# Patient Record
Sex: Female | Born: 1960 | Race: Black or African American | Hispanic: No | Marital: Married | Smoking: Never smoker
Health system: Southern US, Community
[De-identification: ages and names within clinical notes are randomized; demographics above are authoritative.]

## PROBLEM LIST (undated history)

## (undated) DIAGNOSIS — G629 Polyneuropathy, unspecified: Secondary | ICD-10-CM

## (undated) DIAGNOSIS — E119 Type 2 diabetes mellitus without complications: Secondary | ICD-10-CM

## (undated) DIAGNOSIS — E785 Hyperlipidemia, unspecified: Secondary | ICD-10-CM

## (undated) DIAGNOSIS — I10 Essential (primary) hypertension: Secondary | ICD-10-CM

## (undated) DIAGNOSIS — D649 Anemia, unspecified: Secondary | ICD-10-CM

## (undated) DIAGNOSIS — Z8042 Family history of malignant neoplasm of prostate: Secondary | ICD-10-CM

## (undated) DIAGNOSIS — R7303 Prediabetes: Secondary | ICD-10-CM

## (undated) DIAGNOSIS — C801 Malignant (primary) neoplasm, unspecified: Secondary | ICD-10-CM

## (undated) DIAGNOSIS — J189 Pneumonia, unspecified organism: Secondary | ICD-10-CM

## (undated) DIAGNOSIS — F419 Anxiety disorder, unspecified: Secondary | ICD-10-CM

## (undated) DIAGNOSIS — Z803 Family history of malignant neoplasm of breast: Secondary | ICD-10-CM

## (undated) DIAGNOSIS — Z800 Family history of malignant neoplasm of digestive organs: Secondary | ICD-10-CM

## (undated) HISTORY — DX: Hyperlipidemia, unspecified: E78.5

## (undated) HISTORY — DX: Family history of malignant neoplasm of prostate: Z80.42

## (undated) HISTORY — DX: Essential (primary) hypertension: I10

## (undated) HISTORY — DX: Family history of malignant neoplasm of digestive organs: Z80.0

## (undated) HISTORY — PX: OTHER SURGICAL HISTORY: SHX169

## (undated) HISTORY — PX: ABDOMINAL HYSTERECTOMY: SUR658

## (undated) HISTORY — DX: Family history of malignant neoplasm of breast: Z80.3

## (undated) HISTORY — DX: Anxiety disorder, unspecified: F41.9

## (undated) HISTORY — PX: DILATION AND CURETTAGE OF UTERUS: SHX78

## (undated) HISTORY — PX: BREAST CYST EXCISION: SHX579

---

## 2003-07-04 ENCOUNTER — Other Ambulatory Visit: Admission: RE | Admit: 2003-07-04 | Discharge: 2003-07-04 | Payer: Self-pay | Admitting: Family Medicine

## 2004-11-05 ENCOUNTER — Ambulatory Visit: Payer: Self-pay | Admitting: Family Medicine

## 2004-11-05 ENCOUNTER — Other Ambulatory Visit: Admission: RE | Admit: 2004-11-05 | Discharge: 2004-11-05 | Payer: Self-pay | Admitting: Family Medicine

## 2005-01-11 LAB — CONVERTED CEMR LAB

## 2007-10-25 ENCOUNTER — Encounter: Admission: RE | Admit: 2007-10-25 | Discharge: 2007-10-25 | Payer: Self-pay | Admitting: Internal Medicine

## 2007-10-25 ENCOUNTER — Encounter: Payer: Self-pay | Admitting: Internal Medicine

## 2007-11-20 ENCOUNTER — Ambulatory Visit: Payer: Self-pay | Admitting: Internal Medicine

## 2007-11-20 DIAGNOSIS — I10 Essential (primary) hypertension: Secondary | ICD-10-CM | POA: Insufficient documentation

## 2007-11-20 DIAGNOSIS — D649 Anemia, unspecified: Secondary | ICD-10-CM | POA: Insufficient documentation

## 2007-11-22 LAB — CONVERTED CEMR LAB
Albumin: 3.7 g/dL (ref 3.5–5.2)
Basophils Absolute: 0 10*3/uL (ref 0.0–0.1)
Bilirubin Urine: NEGATIVE
CO2: 25 meq/L (ref 19–32)
Chloride: 104 meq/L (ref 96–112)
Direct LDL: 114.3 mg/dL
Eosinophils Relative: 1.7 % (ref 0.0–5.0)
GFR calc Af Amer: 99 mL/min
HCT: 33.8 % — ABNORMAL LOW (ref 36.0–46.0)
Hemoglobin, Urine: NEGATIVE
Ketones, ur: NEGATIVE mg/dL
Lymphocytes Relative: 23.8 % (ref 12.0–46.0)
Monocytes Absolute: 0.4 10*3/uL (ref 0.2–0.7)
Nitrite: NEGATIVE
Potassium: 3.6 meq/L (ref 3.5–5.1)
RDW: 13.5 % (ref 11.5–14.6)
TSH: 1.12 microintl units/mL (ref 0.35–5.50)
Total CHOL/HDL Ratio: 2.6
Total Protein, Urine: NEGATIVE mg/dL
Triglycerides: 44 mg/dL (ref 0–149)
Urine Glucose: NEGATIVE mg/dL
Urobilinogen, UA: 0.2 (ref 0.0–1.0)
VLDL: 9 mg/dL (ref 0–40)
WBC: 7.5 10*3/uL (ref 4.5–10.5)
pH: 5.5 (ref 5.0–8.0)

## 2007-12-05 ENCOUNTER — Ambulatory Visit: Payer: Self-pay | Admitting: Internal Medicine

## 2007-12-05 LAB — CONVERTED CEMR LAB
OCCULT 1: NEGATIVE
OCCULT 3: NEGATIVE

## 2007-12-07 LAB — CONVERTED CEMR LAB
OCCULT 1: NEGATIVE
OCCULT 2: NEGATIVE
OCCULT 3: NEGATIVE
OCCULT 5: NEGATIVE

## 2008-01-02 ENCOUNTER — Ambulatory Visit: Payer: Self-pay | Admitting: Internal Medicine

## 2008-01-15 ENCOUNTER — Ambulatory Visit: Payer: Self-pay | Admitting: Internal Medicine

## 2008-01-15 ENCOUNTER — Encounter: Payer: Self-pay | Admitting: Internal Medicine

## 2008-01-16 LAB — CONVERTED CEMR LAB
BUN: 10 mg/dL (ref 6–23)
CO2: 29 meq/L (ref 19–32)
Chloride: 107 meq/L (ref 96–112)
GFR calc Af Amer: 99 mL/min
GFR calc non Af Amer: 82 mL/min
Glucose, Bld: 96 mg/dL (ref 70–99)
Sodium: 139 meq/L (ref 135–145)

## 2008-03-01 ENCOUNTER — Ambulatory Visit: Payer: Self-pay | Admitting: Internal Medicine

## 2008-04-18 ENCOUNTER — Ambulatory Visit: Payer: Self-pay | Admitting: Internal Medicine

## 2008-04-18 LAB — CONVERTED CEMR LAB
CO2: 30 meq/L (ref 19–32)
Calcium: 9 mg/dL (ref 8.4–10.5)
Potassium: 3.5 meq/L (ref 3.5–5.1)

## 2008-04-21 ENCOUNTER — Telehealth: Payer: Self-pay | Admitting: Internal Medicine

## 2008-10-15 ENCOUNTER — Ambulatory Visit: Payer: Self-pay | Admitting: Internal Medicine

## 2009-01-24 ENCOUNTER — Ambulatory Visit: Payer: Self-pay | Admitting: Internal Medicine

## 2009-01-24 DIAGNOSIS — E785 Hyperlipidemia, unspecified: Secondary | ICD-10-CM | POA: Insufficient documentation

## 2009-01-24 LAB — CONVERTED CEMR LAB
BUN: 18 mg/dL (ref 6–23)
Chloride: 104 meq/L (ref 96–112)
GFR calc non Af Amer: 85.9 mL/min (ref >60)
Potassium: 3.9 meq/L (ref 3.5–5.1)

## 2009-01-26 ENCOUNTER — Telehealth: Payer: Self-pay | Admitting: Internal Medicine

## 2009-02-28 ENCOUNTER — Telehealth: Payer: Self-pay | Admitting: Internal Medicine

## 2009-02-28 ENCOUNTER — Ambulatory Visit: Payer: Self-pay | Admitting: Internal Medicine

## 2009-02-28 LAB — CONVERTED CEMR LAB
ALT: 24 units/L (ref 0–35)
CRP, High Sensitivity: <1 (ref 0.00–5.00)
Cholesterol: 211 mg/dL — ABNORMAL HIGH (ref 0–200)
Direct LDL: 111.8 mg/dL
HDL: 83 mg/dL (ref >39.00)
Triglycerides: 34 mg/dL (ref 0.0–149.0)
VLDL: 6.8 mg/dL (ref 0.0–40.0)

## 2009-03-27 ENCOUNTER — Ambulatory Visit: Payer: Self-pay | Admitting: Internal Medicine

## 2009-03-27 DIAGNOSIS — E663 Overweight: Secondary | ICD-10-CM | POA: Insufficient documentation

## 2009-07-01 ENCOUNTER — Telehealth: Payer: Self-pay | Admitting: Internal Medicine

## 2009-07-02 ENCOUNTER — Telehealth: Payer: Self-pay | Admitting: Internal Medicine

## 2012-05-01 ENCOUNTER — Other Ambulatory Visit: Payer: Self-pay | Admitting: Family Medicine

## 2012-05-01 DIAGNOSIS — Z1231 Encounter for screening mammogram for malignant neoplasm of breast: Secondary | ICD-10-CM

## 2012-05-03 ENCOUNTER — Ambulatory Visit
Admission: RE | Admit: 2012-05-03 | Discharge: 2012-05-03 | Disposition: A | Discharge status: Home / Self Care | Payer: BC Managed Care – PPO | Source: Ambulatory Visit | Attending: Family Medicine | Admitting: Family Medicine

## 2012-05-03 DIAGNOSIS — Z1231 Encounter for screening mammogram for malignant neoplasm of breast: Secondary | ICD-10-CM

## 2012-05-09 ENCOUNTER — Other Ambulatory Visit: Payer: Self-pay | Admitting: Family Medicine

## 2012-05-09 DIAGNOSIS — R928 Other abnormal and inconclusive findings on diagnostic imaging of breast: Secondary | ICD-10-CM

## 2012-05-15 ENCOUNTER — Other Ambulatory Visit: Payer: Self-pay | Admitting: Family Medicine

## 2012-05-15 ENCOUNTER — Ambulatory Visit
Admission: RE | Admit: 2012-05-15 | Discharge: 2012-05-15 | Disposition: A | Discharge status: Home / Self Care | Payer: BC Managed Care – PPO | Source: Ambulatory Visit | Attending: Family Medicine | Admitting: Family Medicine

## 2012-05-15 DIAGNOSIS — R928 Other abnormal and inconclusive findings on diagnostic imaging of breast: Secondary | ICD-10-CM

## 2012-05-15 IMAGING — US US BREAST*R*
1 series · 4 of 4 positions shown · non-contrast
Comparison: Multiple priors

CLINICAL DATA: Abnormal screening, right breast

DIGITAL DIAGNOSTIC RIGHT MAMMOGRAM WITHOUT CAD AND RIGHT BREAST
ULTRASOUND:

[Series 1: us breast*right* · 4 of 4 slices shown]
[im 1/4]
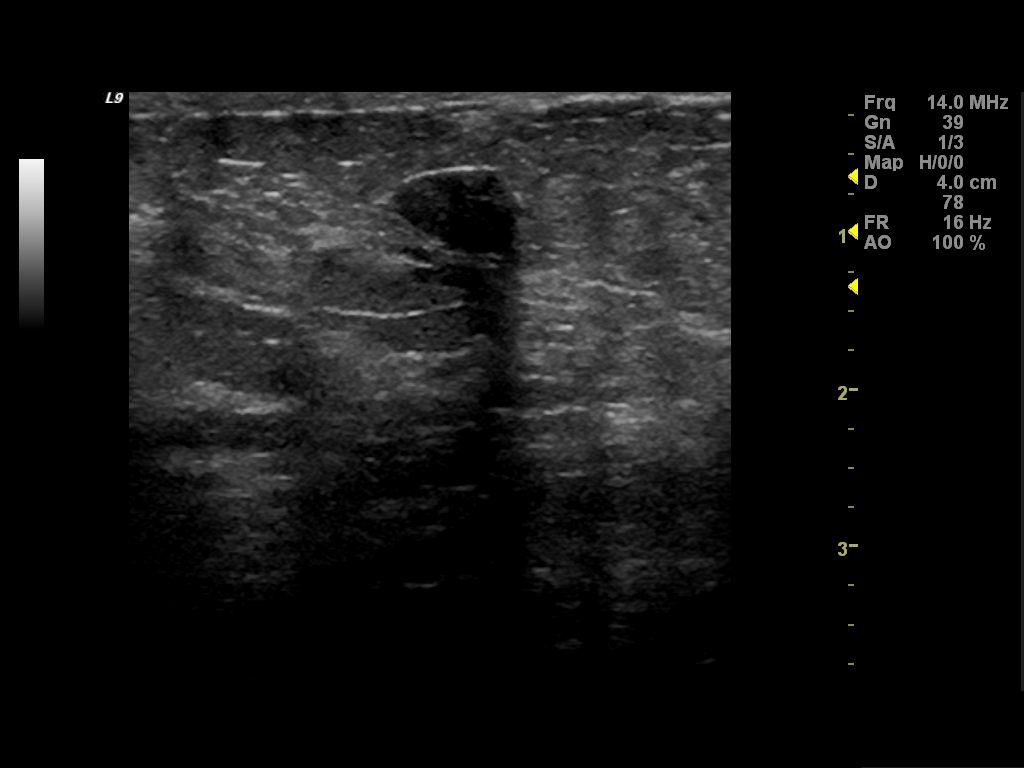
[im 2/4]
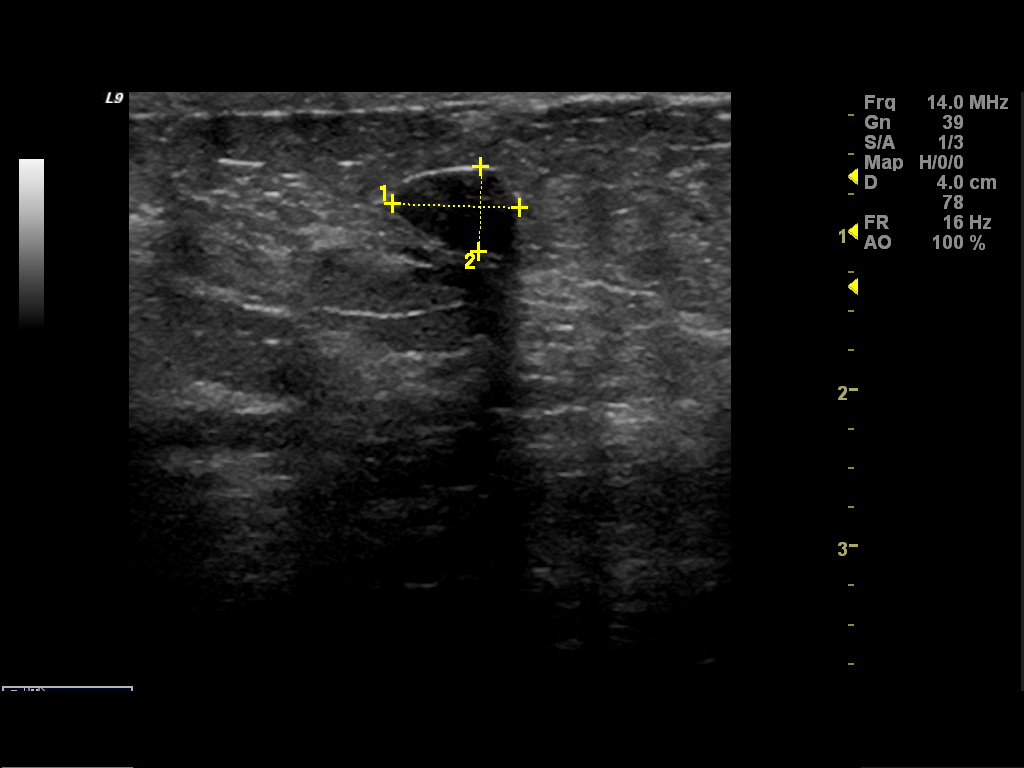
[im 3/4]
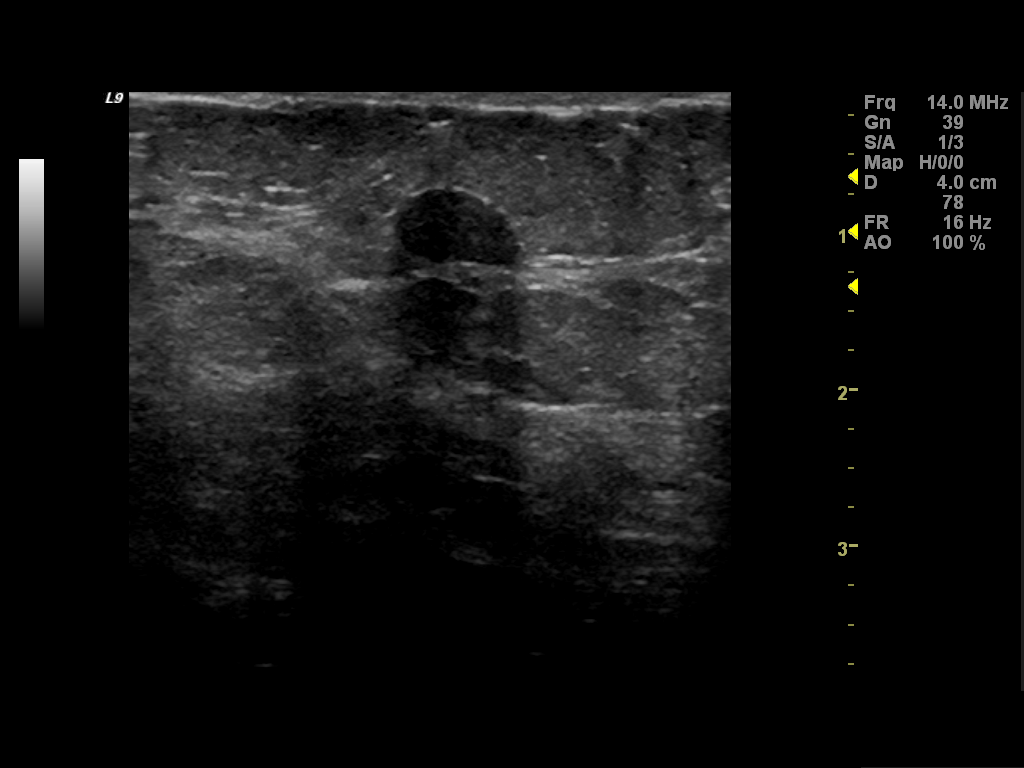
[im 4/4]
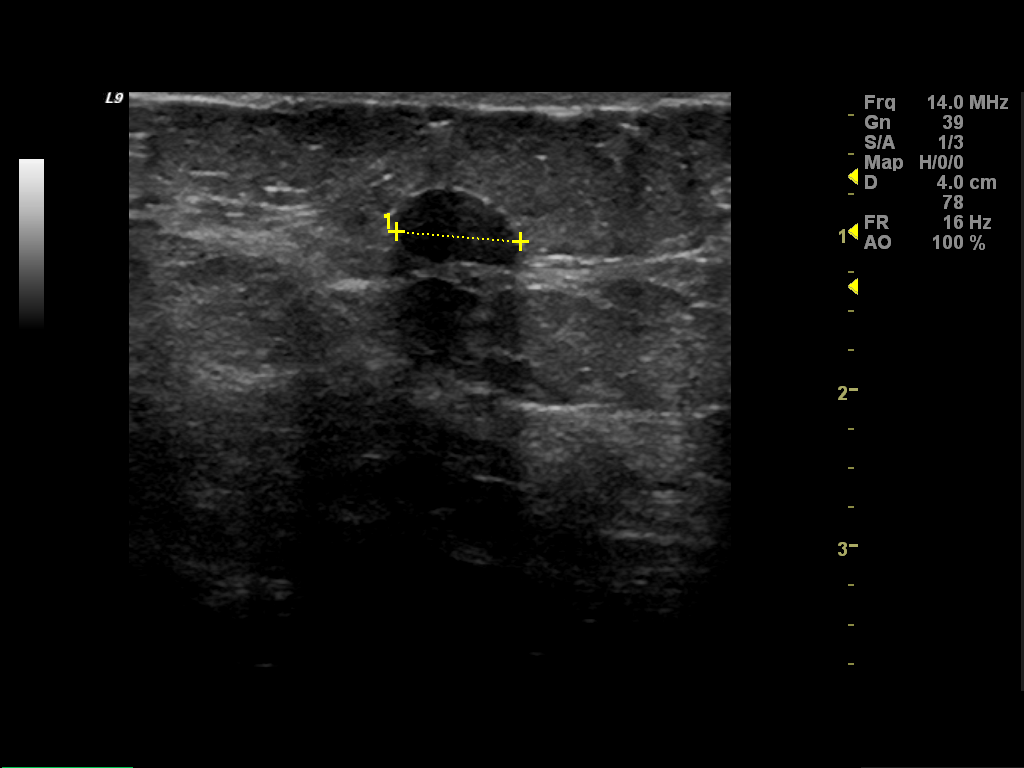

[4 of 4 positions shown; findings below may reference images not displayed]

FINDINGS: Spot compression views in the lower inner quadrant of
the right breast, anteriorly confirm the presence of a round
isodense mass with partially obscured and partially circumscribed
margins.

On physical exam, I palpate normal tissue in the lower outer
quadrant of the right breast.

Ultrasound is performed, showing a round mixed hypo and anechoic
mass with circumscribed margins and posterior acoustic shadowing in
the [DATE] position, 2 cm the nipple measuring 0.8 x 0.5 x 0.8 cm.
This corresponds to the mass seen mammographically.
IMPRESSION: Mass, right breast which biopsy is recommended.

RECOMMENDATION:
Ultrasound guided right breast biopsy.

BI-RADS CATEGORY 4:  Suspicious abnormality - biopsy should be
considered.

## 2012-05-23 ENCOUNTER — Ambulatory Visit
Admission: RE | Admit: 2012-05-23 | Discharge: 2012-05-23 | Disposition: A | Discharge status: Home / Self Care | Payer: BC Managed Care – PPO | Source: Ambulatory Visit | Attending: Family Medicine | Admitting: Family Medicine

## 2012-05-23 ENCOUNTER — Other Ambulatory Visit: Payer: Self-pay | Admitting: Family Medicine

## 2012-05-23 DIAGNOSIS — R928 Other abnormal and inconclusive findings on diagnostic imaging of breast: Secondary | ICD-10-CM

## 2012-05-23 HISTORY — PX: BREAST BIOPSY: SHX20

## 2012-12-27 ENCOUNTER — Encounter: Payer: Self-pay | Admitting: Gastroenterology

## 2013-01-16 ENCOUNTER — Ambulatory Visit (AMBULATORY_SURGERY_CENTER): Payer: BC Managed Care – PPO

## 2013-01-16 VITALS — Ht 68.0 in | Wt 184.6 lb

## 2013-01-16 DIAGNOSIS — Z1211 Encounter for screening for malignant neoplasm of colon: Secondary | ICD-10-CM

## 2013-01-16 MED ORDER — MOVIPREP 100 G PO SOLR: ORAL | Status: DC

## 2013-01-17 ENCOUNTER — Encounter: Payer: Self-pay | Admitting: Gastroenterology

## 2013-01-31 ENCOUNTER — Encounter: Payer: Self-pay | Admitting: Gastroenterology

## 2013-01-31 ENCOUNTER — Ambulatory Visit (AMBULATORY_SURGERY_CENTER): Payer: BC Managed Care – PPO | Admitting: Gastroenterology

## 2013-01-31 VITALS — BP 126/44 | HR 58 | Temp 97.2°F | Resp 17 | Ht 68.0 in | Wt 184.0 lb

## 2013-01-31 DIAGNOSIS — Z1211 Encounter for screening for malignant neoplasm of colon: Secondary | ICD-10-CM

## 2013-01-31 DIAGNOSIS — Z538 Procedure and treatment not carried out for other reasons: Secondary | ICD-10-CM

## 2013-01-31 MED ORDER — SODIUM CHLORIDE 0.9 % IV SOLN: 500.0000 mL | INTRAVENOUS | Status: DC

## 2013-01-31 NOTE — Progress Notes (Signed)
Patient did not experience any of the following events: a burn prior to discharge; a fall within the facility; wrong site/side/patient/procedure/implant event; or a hospital transfer or hospital admission upon discharge from the facility. (G8907) Patient did not have preoperative order for IV antibiotic SSI prophylaxis. (G8918)  

## 2013-01-31 NOTE — Patient Instructions (Addendum)
Discharge instructions given with verbal understanding. Normal exam. Resume previous medications. YOU HAD AN ENDOSCOPIC PROCEDURE TODAY AT THE [facility]: Refer to the procedure report that was given to you for any specific questions about what was found during the examination.  If the procedure report does not answer your questions, please call your gastroenterologist to clarify.  If you requested that your care partner not be given the details of your procedure findings, then the procedure report has been included in a sealed envelope for you to review at your convenience later.  YOU SHOULD EXPECT: Some feelings of bloating in the abdomen. Passage of more gas than usual.  Walking can help get rid of the air that was put into your GI tract during the procedure and reduce the bloating. If you had a lower endoscopy (such as a colonoscopy or flexible sigmoidoscopy) you may notice spotting of blood in your stool or on the toilet paper. If you underwent a bowel prep for your procedure, then you may not have a normal bowel movement for a few days.  DIET: Your first meal following the procedure should be a light meal and then it is ok to progress to your normal diet.  A half-sandwich or bowl of soup is an example of a good first meal.  Heavy or fried foods are harder to digest and may make you feel nauseous or bloated.  Likewise meals heavy in dairy and vegetables can cause extra gas to form and this can also increase the bloating.  Drink plenty of fluids but you should avoid alcoholic beverages for 24 hours.  ACTIVITY: Your care partner should take you home directly after the procedure.  You should plan to take it easy, moving slowly for the rest of the day.  You can resume normal activity the day after the procedure however you should NOT DRIVE or use heavy machinery for 24 hours (because of the sedation medicines used during the test).    SYMPTOMS TO REPORT IMMEDIATELY: A gastroenterologist  can be reached at any hour.  During normal business hours, 8:30 AM to 5:00 PM Monday through Friday, call [phone].  After hours and on weekends, please call the GI answering service at [phone] who will take a message and have the physician on call contact you.   Following lower endoscopy (colonoscopy or flexible sigmoidoscopy):  Excessive amounts of blood in the stool  Significant tenderness or worsening of abdominal pains  Swelling of the abdomen that is new, acute  Fever of 100F or higher  FOLLOW UP: If any biopsies were taken you will be contacted by phone or by letter within the next 1-3 weeks.  Call your gastroenterologist if you have not heard about the biopsies in 3 weeks.  Our staff will call the home number listed on your records the next business day following your procedure to check on you and address any questions or concerns that you may have at that time regarding the information given to you following your procedure. This is a courtesy call and so if there is no answer at the home number and we have not heard from you through the emergency physician on call, we will assume that you have returned to your regular daily activities without incident.  SIGNATURES/CONFIDENTIALITY: You and/or your care partner have signed paperwork which will be entered into your electronic medical record.  These signatures attest to the fact that that the information above on your After Visit Summary has been reviewed   and is understood.  Full responsibility of the confidentiality of this discharge information lies with you and/or your care-partner. 

## 2013-01-31 NOTE — Op Note (Signed)
[facility] [address] [place], [address]   COLONOSCOPY PROCEDURE REPORT  PATIENT: Rita, Davis  MR#: [id number] BIRTHDATE: 1960-11-19 , 52  yrs. old GENDER: Female ENDOSCOPIST: Mardella Layman, MD, Clementeen Graham REFERRED BY:  Nolon Nations, M.D. PROCEDURE DATE:  01/31/2013 PROCEDURE:   Colonoscopy, surveillance ASA CLASS:   Class II INDICATIONS:Average risk patient for colon cancer. MEDICATIONS: propofol (Diprivan) 300mg  IV  DESCRIPTION OF PROCEDURE:   After the risks and benefits and of the procedure were explained, informed consent was obtained.  A digital rectal exam revealed no abnormalities of the rectum.    The LB [id number]  endoscope was introduced through the anus and advanced to the cecum, which was identified by both the appendix and ileocecal valve .  The quality of the prep was poor, using MoviPrep .  The instrument was then slowly withdrawn as the colon was fully examined.     COLON FINDINGS: A normal appearing cecum, ileocecal valve, and appendiceal orifice were identified.  The ascending, hepatic flexure, transverse, splenic flexure, descending, sigmoid colon and rectum appeared unremarkable.  No polyps or cancers were seen. The scope was then withdrawn from the patient and the procedure completed.Changes of mucosal prolapse noted in rectum. C COMPLICATIONS: There were no complications. ENDOSCOPIC IMPRESSION: Normal colon  RECOMMENDATIONS: 1.  Repeat Colonoscopy in 5 years per poor prep ...will need "double prep ".... 2.  Metamucil or benefiber   REPEAT EXAM:  cc:  _______________________________ eSignedMardella Layman, MD, [facility] 01/31/2013 8:50 AM

## 2013-02-01 ENCOUNTER — Telehealth: Payer: Self-pay | Admitting: *Deleted

## 2013-02-01 NOTE — Telephone Encounter (Signed)
  Follow up Call-  Call back number 01/31/2013  Post procedure Call Back phone  # [phone] or cell [phone]  Permission to leave phone message Yes     Patient questions:  Do you have a fever, pain , or abdominal swelling? no Pain Score  0 *  Have you tolerated food without any problems? yes  Have you been able to return to your normal activities? yes  Do you have any questions about your discharge instructions: Diet   no Medications  no Follow up visit  no  Do you have questions or concerns about your Care? no  Actions: * If pain score is 4 or above: No action needed, pain <4.

## 2013-05-03 ENCOUNTER — Other Ambulatory Visit: Payer: Self-pay

## 2013-05-03 DIAGNOSIS — Z1231 Encounter for screening mammogram for malignant neoplasm of breast: Secondary | ICD-10-CM

## 2013-05-21 ENCOUNTER — Ambulatory Visit
Admission: RE | Admit: 2013-05-21 | Discharge: 2013-05-21 | Disposition: A | Discharge status: Home / Self Care | Payer: BC Managed Care – PPO | Source: Ambulatory Visit

## 2013-05-21 DIAGNOSIS — Z1231 Encounter for screening mammogram for malignant neoplasm of breast: Secondary | ICD-10-CM

## 2014-02-28 DIAGNOSIS — F411 Generalized anxiety disorder: Secondary | ICD-10-CM | POA: Insufficient documentation

## 2014-04-10 DIAGNOSIS — Z0000 Encounter for general adult medical examination without abnormal findings: Secondary | ICD-10-CM | POA: Insufficient documentation

## 2014-04-22 ENCOUNTER — Other Ambulatory Visit: Payer: Self-pay

## 2014-04-22 DIAGNOSIS — Z1231 Encounter for screening mammogram for malignant neoplasm of breast: Secondary | ICD-10-CM

## 2014-05-22 ENCOUNTER — Encounter (INDEPENDENT_AMBULATORY_CARE_PROVIDER_SITE_OTHER): Payer: Self-pay

## 2014-05-22 ENCOUNTER — Ambulatory Visit
Admission: RE | Admit: 2014-05-22 | Discharge: 2014-05-22 | Disposition: A | Discharge status: Home / Self Care | Payer: BC Managed Care – PPO | Source: Ambulatory Visit

## 2014-05-22 DIAGNOSIS — Z1231 Encounter for screening mammogram for malignant neoplasm of breast: Secondary | ICD-10-CM

## 2015-05-21 ENCOUNTER — Other Ambulatory Visit: Payer: Self-pay

## 2015-05-21 ENCOUNTER — Other Ambulatory Visit: Payer: Self-pay | Admitting: Family Medicine

## 2015-05-21 DIAGNOSIS — Z1231 Encounter for screening mammogram for malignant neoplasm of breast: Secondary | ICD-10-CM

## 2015-05-26 ENCOUNTER — Ambulatory Visit: Payer: BC Managed Care – PPO

## 2015-05-27 ENCOUNTER — Ambulatory Visit
Admission: RE | Admit: 2015-05-27 | Discharge: 2015-05-27 | Disposition: A | Discharge status: Home / Self Care | Payer: BC Managed Care – PPO | Source: Ambulatory Visit

## 2015-05-27 DIAGNOSIS — Z1231 Encounter for screening mammogram for malignant neoplasm of breast: Secondary | ICD-10-CM

## 2015-11-26 DIAGNOSIS — L309 Dermatitis, unspecified: Secondary | ICD-10-CM | POA: Insufficient documentation

## 2015-11-26 DIAGNOSIS — F5102 Adjustment insomnia: Secondary | ICD-10-CM | POA: Insufficient documentation

## 2015-11-26 DIAGNOSIS — R7989 Other specified abnormal findings of blood chemistry: Secondary | ICD-10-CM | POA: Insufficient documentation

## 2016-04-21 ENCOUNTER — Other Ambulatory Visit: Payer: Self-pay | Admitting: Family Medicine

## 2016-04-21 DIAGNOSIS — Z1231 Encounter for screening mammogram for malignant neoplasm of breast: Secondary | ICD-10-CM

## 2016-05-28 ENCOUNTER — Ambulatory Visit
Admission: RE | Admit: 2016-05-28 | Discharge: 2016-05-28 | Disposition: A | Discharge status: Home / Self Care | Payer: BC Managed Care – PPO | Source: Ambulatory Visit | Attending: Family Medicine | Admitting: Family Medicine

## 2016-05-28 DIAGNOSIS — Z1231 Encounter for screening mammogram for malignant neoplasm of breast: Secondary | ICD-10-CM

## 2017-05-18 ENCOUNTER — Other Ambulatory Visit: Payer: Self-pay | Admitting: Family Medicine

## 2017-05-18 DIAGNOSIS — Z1231 Encounter for screening mammogram for malignant neoplasm of breast: Secondary | ICD-10-CM

## 2017-05-30 ENCOUNTER — Ambulatory Visit: Payer: BC Managed Care – PPO

## 2017-05-30 ENCOUNTER — Ambulatory Visit
Admission: RE | Admit: 2017-05-30 | Discharge: 2017-05-30 | Disposition: A | Discharge status: Home / Self Care | Payer: BC Managed Care – PPO | Source: Ambulatory Visit | Attending: Family Medicine | Admitting: Family Medicine

## 2017-05-30 DIAGNOSIS — Z1231 Encounter for screening mammogram for malignant neoplasm of breast: Secondary | ICD-10-CM

## 2018-02-07 ENCOUNTER — Encounter: Payer: Self-pay | Admitting: Gastroenterology

## 2018-03-10 DIAGNOSIS — D219 Benign neoplasm of connective and other soft tissue, unspecified: Secondary | ICD-10-CM | POA: Insufficient documentation

## 2018-03-21 ENCOUNTER — Encounter: Payer: Self-pay | Admitting: Sports Medicine

## 2018-03-21 ENCOUNTER — Ambulatory Visit (INDEPENDENT_AMBULATORY_CARE_PROVIDER_SITE_OTHER): Payer: BC Managed Care – PPO

## 2018-03-21 ENCOUNTER — Ambulatory Visit: Payer: BC Managed Care – PPO | Admitting: Sports Medicine

## 2018-03-21 VITALS — BP 128/72 | HR 69 | Resp 16

## 2018-03-21 DIAGNOSIS — M79671 Pain in right foot: Secondary | ICD-10-CM

## 2018-03-21 DIAGNOSIS — J189 Pneumonia, unspecified organism: Secondary | ICD-10-CM | POA: Insufficient documentation

## 2018-03-21 DIAGNOSIS — M2021 Hallux rigidus, right foot: Secondary | ICD-10-CM

## 2018-03-21 DIAGNOSIS — M2012 Hallux valgus (acquired), left foot: Secondary | ICD-10-CM

## 2018-03-21 DIAGNOSIS — M2011 Hallux valgus (acquired), right foot: Secondary | ICD-10-CM

## 2018-03-21 DIAGNOSIS — M79672 Pain in left foot: Secondary | ICD-10-CM | POA: Diagnosis not present

## 2018-03-21 DIAGNOSIS — M2022 Hallux rigidus, left foot: Secondary | ICD-10-CM

## 2018-03-21 NOTE — Progress Notes (Signed)
Subjective: Rita Davis is a 57 y.o. female patient who presents to office for evaluation of Left>Right bunion pain. Patient complains of progressive pain especially over the last year in the Left>Right foot that starts as pain over the bump with direct pressure and range of motion; patient now has difficulty fitting shoes comfortably. Pain is now interferring with daily activities. Admits earlier this month had blister on right great toe due to shoes and rubbing.  Patient has also tried discussing with a doctor in the past who told she need surgery. Patient denies any other pedal complaints.   Review of Systems  Musculoskeletal: Positive for joint pain.  All other systems reviewed and are negative.   Patient Active Problem List   Diagnosis Date Noted  . Pneumonia 03/21/2018  . Fibroid tumor 03/10/2018  . Adjustment insomnia 11/26/2015  . Eczema 11/26/2015  . Other specified abnormal findings of blood chemistry 11/26/2015  . Healthcare maintenance 04/10/2014  . Anxiety state 02/28/2014  . OVERWEIGHT 03/27/2009  . Overweight 03/27/2009  . HYPERLIPIDEMIA 01/24/2009  . ANEMIA 11/20/2007  . HYPERTENSION 11/20/2007    Current Outpatient Medications on File Prior to Visit  Medication Sig Dispense Refill  . amLODipine-olmesartan (AZOR) 5-40 MG per tablet Take 1 tablet by mouth daily.    Marland Kitchen BELVIQ XR 20 MG TB24     . escitalopram (LEXAPRO) 10 MG tablet Take 10 mg by mouth as needed.     . methocarbamol (ROBAXIN) 500 MG tablet     . Multiple Vitamin (MULTI-VITAMINS) TABS Take by mouth.    . naproxen (NAPROSYN) 500 MG tablet     . simvastatin (ZOCOR) 10 MG tablet Take 10 mg by mouth at bedtime. Pt stated, "I am not taking my medication, I am trying to manage my cholesterol myself" -- 03/21/18    . zolpidem (AMBIEN) 10 MG tablet Take 10 mg by mouth at bedtime as needed for sleep.     No current facility-administered medications on file prior to visit.     No Known Allergies  Objective:   General: Alert and oriented x3 in no acute distress  Dermatology: No open lesions bilateral lower extremities, no webspace macerations, no ecchymosis bilateral, all nails x 10 are well manicured.  Vascular: Dorsalis Pedis and Posterior Tibial pedal pulses 2/4, Capillary Fill Time 3 seconds, (+) pedal hair growth bilateral, no edema bilateral lower extremities, Temperature gradient within normal limits.  Neurology: Johney Maine sensation intact via light touch bilateral,(-) Tinels sign bilateral.   Musculoskeletal: Mild tenderness with palpation left>right bunion deformity, mild limitation or crepitus with range of motion, deformity reducible, tracking not trackbound, there is mild 1st ray hypermobility noted bilateral. Midtarsal, forefoot slight abduction with HAV deformity supported on ground with no second toe crossover deformity noted.   Gait: Non-Antalgic gait with increased medial arch collapse and pronatory influence noted on Left>Right foot with medial 1st MTPJ roll-off at toe-off, heel off within normal limits.   Xrays  Right/Left Foot    Impression: Intermetatarsal angle above normal limits with square met head and elevation 1st metatarsals bilateral.       Assessment and Plan: Problem List Items Addressed This Visit    None    Visit Diagnoses    Valgus deformity of both great toes    -  Primary   L>R   Relevant Orders   DG Foot Complete Right (Completed)   DG Foot Complete Left (Completed)   Hallux rigidus of both feet       Foot  pain, bilateral           -Complete examination performed -Xrays reviewed -Discussed treatement options; discussed HAV  And limitus deformity;conservative and  Surgical management; risks, benefits, alternatives discussed. All patient's questions answered. -Dispensed bunion shields and advised patient to consider surgery.   -Recommend continue with good supportive shoes and inserts.  -Patient to return to office in 3-4 weeks for surgery consult or  sooner if condition worsens.  Landis Martins, DPM

## 2018-03-21 NOTE — Patient Instructions (Signed)

## 2018-04-11 ENCOUNTER — Ambulatory Visit: Payer: BC Managed Care – PPO | Admitting: Sports Medicine

## 2018-04-11 ENCOUNTER — Encounter: Payer: Self-pay | Admitting: Sports Medicine

## 2018-04-11 DIAGNOSIS — M79672 Pain in left foot: Secondary | ICD-10-CM

## 2018-04-11 DIAGNOSIS — M2021 Hallux rigidus, right foot: Secondary | ICD-10-CM

## 2018-04-11 DIAGNOSIS — M2022 Hallux rigidus, left foot: Secondary | ICD-10-CM | POA: Diagnosis not present

## 2018-04-11 DIAGNOSIS — M2012 Hallux valgus (acquired), left foot: Secondary | ICD-10-CM

## 2018-04-11 DIAGNOSIS — M2011 Hallux valgus (acquired), right foot: Secondary | ICD-10-CM

## 2018-04-11 DIAGNOSIS — M79671 Pain in right foot: Secondary | ICD-10-CM

## 2018-04-11 NOTE — Progress Notes (Addendum)
Subjective: Rita Davis is a 57 y.o. female patient who returns to office for evaluation of Left>Right bunion pain. Patient states that she is still having discomfort at L>R bunion and wants to further surgical intervention. Patient states that she is having difficulty with wearing shoes. Denies any other pedal complaints.   Patient Active Problem List   Diagnosis Date Noted  . Pneumonia 03/21/2018  . Fibroid tumor 03/10/2018  . Adjustment insomnia 11/26/2015  . Eczema 11/26/2015  . Other specified abnormal findings of blood chemistry 11/26/2015  . Healthcare maintenance 04/10/2014  . Anxiety state 02/28/2014  . OVERWEIGHT 03/27/2009  . Overweight 03/27/2009  . HYPERLIPIDEMIA 01/24/2009  . ANEMIA 11/20/2007  . HYPERTENSION 11/20/2007    Current Outpatient Medications on File Prior to Visit  Medication Sig Dispense Refill  . amLODipine-olmesartan (AZOR) 5-40 MG per tablet Take 1 tablet by mouth daily.    Marland Kitchen BELVIQ XR 20 MG TB24     . escitalopram (LEXAPRO) 10 MG tablet Take 10 mg by mouth as needed.     . methocarbamol (ROBAXIN) 500 MG tablet     . Multiple Vitamin (MULTI-VITAMINS) TABS Take by mouth.    . naproxen (NAPROSYN) 500 MG tablet     . simvastatin (ZOCOR) 10 MG tablet Take 10 mg by mouth at bedtime. Pt stated, "I am not taking my medication, I am trying to manage my cholesterol myself" -- 03/21/18    . zolpidem (AMBIEN) 10 MG tablet Take 10 mg by mouth at bedtime as needed for sleep.     No current facility-administered medications on file prior to visit.     No Known Allergies  Objective:  General: Alert and oriented x3 in no acute distress  Dermatology: No open lesions bilateral lower extremities, no webspace macerations, no ecchymosis bilateral, all nails x 10 are well manicured.  Vascular: Dorsalis Pedis and Posterior Tibial pedal pulses 2/4, Capillary Fill Time 3 seconds, (+) pedal hair growth bilateral, no edema bilateral lower extremities, Temperature  gradient within normal limits.  Neurology: Johney Maine sensation intact via light touch bilateral,(-) Tinels sign bilateral.   Musculoskeletal: Mild tenderness with palpation left>right bunion deformity, mild limitation or crepitus with range of motion, deformity reducible, tracking not trackbound, there is mild 1st ray hypermobility noted bilateral. Midtarsal, forefoot slight abduction with HAV deformity supported on ground with no second toe crossover deformity noted.   Gait: Non-Antalgic gait with increased medial arch collapse and pronatory influence noted on Left>Right foot with medial 1st MTPJ roll-off at toe-off, heel off within normal limits.       Assessment and Plan: Problem List Items Addressed This Visit    None    Visit Diagnoses    Valgus deformity of both great toes    -  Primary   Hallux rigidus of both feet       Foot pain, bilateral           -Complete examination performed -Discussed treatement options; discussed HAV And limitus deformity;conservative and  Surgical management; risks, benefits, alternatives discussed. All patient's questions answered. -Previous xrays reviewed  -Patient opt for surgical management. Consent obtained for Left youngswick bunioectomy. Pre and Post op course explained. Risks, benefits, alternatives explained. No guarantees given or implied. Surgical booking slip submitted and provided patient with Surgical packet and info for [place]. -Dispensed CAM Walker to use post op. Will dispense crutches at surgery center  -Recommend 12 weeks FMLA post op  -Patient to return to office after surgery or sooner if condition worsens.  Landis Martins, DPM

## 2018-04-11 NOTE — Patient Instructions (Signed)
Pre-Operative Instructions  Congratulations, you have decided to take an important step towards improving your quality of life.  You can be assured that the doctors and staff at [facility] will be with you every step of the way.  Here are some important things you should know:  1. Plan to be at the surgery center/hospital at least 1 (one) hour prior to your scheduled time, unless otherwise directed by the surgical center/hospital staff.  You must have a responsible adult accompany you, remain during the surgery and drive you home.  Make sure you have directions to the surgical center/hospital to ensure you arrive on time. 2. If you are having surgery at [facility] or [facility] hospitals, you will need a copy of your medical history and physical form from your family physician within one month prior to the date of surgery. We will give you a form for your primary physician to complete.  3. We make every effort to accommodate the date you request for surgery.  However, there are times where surgery dates or times have to be moved.  We will contact you as soon as possible if a change in schedule is required.   4. No aspirin/ibuprofen for one week before surgery.  If you are on aspirin, any non-steroidal anti-inflammatory medications (Mobic, Aleve, Ibuprofen) should not be taken seven (7) days prior to your surgery.  You make take Tylenol for pain prior to surgery.  5. Medications - If you are taking daily heart and blood pressure medications, seizure, reflux, allergy, asthma, anxiety, pain or diabetes medications, make sure you notify the surgery center/hospital before the day of surgery so they can tell you which medications you should take or avoid the day of surgery. 6. No food or drink after midnight the night before surgery unless directed otherwise by surgical center/hospital staff. 7. No alcoholic beverages 24-hours prior to surgery.  No smoking 24-hours prior or 24-hours after  surgery. 8. Wear loose pants or shorts. They should be loose enough to fit over bandages, boots, and casts. 9. Don't wear slip-on shoes. Sneakers are preferred. 10. Bring your boot with you to the surgery center/hospital.  Also bring crutches or a walker if your physician has prescribed it for you.  If you do not have this equipment, it will be provided for you after surgery. 11. If you have not been contacted by the surgery center/hospital by the day before your surgery, call to confirm the date and time of your surgery. 12. Leave-time from work may vary depending on the type of surgery you have.  Appropriate arrangements should be made prior to surgery with your employer. 13. Prescriptions will be provided immediately following surgery by your doctor.  Fill these as soon as possible after surgery and take the medication as directed. Pain medications will not be refilled on weekends and must be approved by the doctor. 14. Remove nail polish on the operative foot and avoid getting pedicures prior to surgery. 15. Wash the night before surgery.  The night before surgery wash the foot and leg well with water and the antibacterial soap provided. Be sure to pay special attention to beneath the toenails and in between the toes.  Wash for at least three (3) minutes. Rinse thoroughly with water and dry well with a towel.  Perform this wash unless told not to do so by your physician.  Enclosed: 1 Ice pack (please put in freezer the night before surgery)   1 Hibiclens skin cleaner     Pre-op instructions  If you have any questions regarding the instructions, please do not hesitate to call our office.  [place]: [address], [place], [place] [address] -- [phone]  [place]: [address] [place], [place] [address] -- [phone]  [place]: [address]  [place], [place] [address] -- [phone]  [place]: [address], [address], [place], [place] [address] -- [phone]  Website:  [email or web address] 

## 2018-04-18 ENCOUNTER — Ambulatory Visit: Payer: BC Managed Care – PPO | Admitting: Sports Medicine

## 2018-04-28 ENCOUNTER — Other Ambulatory Visit: Payer: Self-pay | Admitting: Family Medicine

## 2018-04-28 DIAGNOSIS — Z1231 Encounter for screening mammogram for malignant neoplasm of breast: Secondary | ICD-10-CM

## 2018-06-05 ENCOUNTER — Ambulatory Visit
Admission: RE | Admit: 2018-06-05 | Discharge: 2018-06-05 | Disposition: A | Discharge status: Home / Self Care | Payer: BC Managed Care – PPO | Source: Ambulatory Visit | Attending: Family Medicine | Admitting: Family Medicine

## 2018-06-05 ENCOUNTER — Ambulatory Visit: Payer: BC Managed Care – PPO

## 2018-06-05 DIAGNOSIS — Z1231 Encounter for screening mammogram for malignant neoplasm of breast: Secondary | ICD-10-CM

## 2018-06-12 ENCOUNTER — Encounter: Payer: Self-pay | Admitting: Sports Medicine

## 2018-06-12 DIAGNOSIS — M2022 Hallux rigidus, left foot: Secondary | ICD-10-CM

## 2018-06-13 ENCOUNTER — Telehealth: Payer: Self-pay | Admitting: Sports Medicine

## 2018-06-13 NOTE — Telephone Encounter (Signed)
Post op check phone call made to patient. Patient is doing well, reports no pain and that the block has not wore off yet. I reminded patient to continue with keeping dressing intact, NWB and cam boot protection, ice, elevation, and taking meds as Rx. Patient expressed understanding. Patient to follow up as scheduled for continued post op care. -Dr. Cannon Kettle

## 2018-06-20 ENCOUNTER — Ambulatory Visit (INDEPENDENT_AMBULATORY_CARE_PROVIDER_SITE_OTHER): Payer: BC Managed Care – PPO

## 2018-06-20 ENCOUNTER — Encounter: Payer: Self-pay | Admitting: Sports Medicine

## 2018-06-20 ENCOUNTER — Ambulatory Visit (INDEPENDENT_AMBULATORY_CARE_PROVIDER_SITE_OTHER): Payer: BC Managed Care – PPO | Admitting: Sports Medicine

## 2018-06-20 VITALS — BP 127/70 | HR 73 | Resp 16

## 2018-06-20 DIAGNOSIS — M2012 Hallux valgus (acquired), left foot: Secondary | ICD-10-CM

## 2018-06-20 DIAGNOSIS — Z9889 Other specified postprocedural states: Secondary | ICD-10-CM

## 2018-06-20 DIAGNOSIS — M2022 Hallux rigidus, left foot: Secondary | ICD-10-CM

## 2018-06-20 MED ORDER — IBUPROFEN 800 MG PO TABS: 800.0000 mg | ORAL_TABLET | Freq: Three times a day (TID) | ORAL | 1 refills | Status: DC | PRN

## 2018-06-20 NOTE — Progress Notes (Signed)
Subjective: Rita Davis is a 57 y.o. female patient seen today in office for POV #1 (DOS 06-12-18), S/P L Youngswick bunionectomy. Patient admits a little pain at surgical site 4/10, denies calf pain, denies headache, chest pain, shortness of breath, nausea, vomiting, fever, or chills. Patient states that she is doing well and is only taking Ibuprofen. No other issues noted.   Patient Active Problem List   Diagnosis Date Noted  . Pneumonia 03/21/2018  . Fibroid tumor 03/10/2018  . Adjustment insomnia 11/26/2015  . Eczema 11/26/2015  . Other specified abnormal findings of blood chemistry 11/26/2015  . Healthcare maintenance 04/10/2014  . Anxiety state 02/28/2014  . OVERWEIGHT 03/27/2009  . Overweight 03/27/2009  . HYPERLIPIDEMIA 01/24/2009  . ANEMIA 11/20/2007  . HYPERTENSION 11/20/2007    Current Outpatient Medications on File Prior to Visit  Medication Sig Dispense Refill  . amLODipine-olmesartan (AZOR) 5-40 MG per tablet Take 1 tablet by mouth daily.    Marland Kitchen BELVIQ XR 20 MG TB24     . escitalopram (LEXAPRO) 10 MG tablet Take 10 mg by mouth as needed.     . methocarbamol (ROBAXIN) 500 MG tablet     . Multiple Vitamin (MULTI-VITAMINS) TABS Take by mouth.    . naproxen (NAPROSYN) 500 MG tablet     . promethazine (PHENERGAN) 25 MG tablet     . simvastatin (ZOCOR) 10 MG tablet Take 10 mg by mouth at bedtime. Pt stated, "I am not taking my medication, I am trying to manage my cholesterol myself" -- 03/21/18    . zolpidem (AMBIEN) 10 MG tablet Take 10 mg by mouth at bedtime as needed for sleep.     No current facility-administered medications on file prior to visit.     No Known Allergies  Objective: There were no vitals filed for this visit.  General: No acute distress, AAOx3  Left foot: Sutures intact with no gapping or dehiscence at surgical site, mild swelling to left foot, no erythema, no warmth, no drainage, no signs of infection noted, Capillary fill time <3 seconds in all  digits, gross sensation present via light touch to left foot. No pain or crepitation with range of motion left foot.  No pain with calf compression.   Post Op Xray, Left foot: 1st Met in Excellent alignment and position. Osteotomy site healing. Hardware intact. Soft tissue swelling within normal limits for post op status.   Assessment and Plan:  Problem List Items Addressed This Visit    None    Visit Diagnoses    S/P foot surgery, left    -  Primary   Hallux valgus of left foot       Relevant Orders   DG Foot Complete Left   Hallux rigidus, left foot           -Patient seen and evaluated -Applied dry sterile dressing to surgical site left foot secured with ACE wrap and stockinet  -Advised patient to make sure to keep dressings clean, dry, and intact to left surgical site, removing the ACE as needed  -Advised patient to continue with CAM boot and crutches -Advised patient to limit activity to necessity  -Advised patient to ice and elevate as necessary  -Refilled Motrin  -Will plan for suture removal at next office visit. In the meantime, patient to call office if any issues or problems arise.   Landis Martins, DPM

## 2018-06-27 ENCOUNTER — Ambulatory Visit (INDEPENDENT_AMBULATORY_CARE_PROVIDER_SITE_OTHER): Payer: BC Managed Care – PPO | Admitting: Sports Medicine

## 2018-06-27 ENCOUNTER — Encounter: Payer: Self-pay | Admitting: Sports Medicine

## 2018-06-27 DIAGNOSIS — M2022 Hallux rigidus, left foot: Secondary | ICD-10-CM

## 2018-06-27 DIAGNOSIS — Z9889 Other specified postprocedural states: Secondary | ICD-10-CM

## 2018-06-27 DIAGNOSIS — M2012 Hallux valgus (acquired), left foot: Secondary | ICD-10-CM

## 2018-06-27 NOTE — Progress Notes (Signed)
Subjective: Rita Davis is a 57 y.o. female patient seen today in office for POV #2 (DOS 06-12-18), S/P L [place] bunionectomy. Patient denies pain to surgical site.  Denies calf pain, denies headache, chest pain, shortness of breath, nausea, vomiting, fever, or chills. Patient states that she is doing well and is only taking Ibuprofen. No other issues noted.   Patient Active Problem List   Diagnosis Date Noted  . Pneumonia 03/21/2018  . Fibroid tumor 03/10/2018  . Adjustment insomnia 11/26/2015  . Eczema 11/26/2015  . Other specified abnormal findings of blood chemistry 11/26/2015  . Healthcare maintenance 04/10/2014  . Anxiety state 02/28/2014  . OVERWEIGHT 03/27/2009  . Overweight 03/27/2009  . HYPERLIPIDEMIA 01/24/2009  . ANEMIA 11/20/2007  . HYPERTENSION 11/20/2007    Current Outpatient Medications on File Prior to Visit  Medication Sig Dispense Refill  . amLODipine-olmesartan (AZOR) 5-40 MG per tablet Take 1 tablet by mouth daily.    Marland Kitchen BELVIQ XR 20 MG TB24     . escitalopram (LEXAPRO) 10 MG tablet Take 10 mg by mouth as needed.     Marland Kitchen ibuprofen (ADVIL,MOTRIN) 800 MG tablet Take 1 tablet (800 mg total) by mouth every 8 (eight) hours as needed. 30 tablet 1  . methocarbamol (ROBAXIN) 500 MG tablet     . Multiple Vitamin (MULTI-VITAMINS) TABS Take by mouth.    . naproxen (NAPROSYN) 500 MG tablet     . promethazine (PHENERGAN) 25 MG tablet     . simvastatin (ZOCOR) 10 MG tablet Take 10 mg by mouth at bedtime. Pt stated, "I am not taking my medication, I am trying to manage my cholesterol myself" -- 03/21/18    . zolpidem (AMBIEN) 10 MG tablet Take 10 mg by mouth at bedtime as needed for sleep.     No current facility-administered medications on file prior to visit.     No Known Allergies  Objective: There were no vitals filed for this visit.  General: No acute distress, AAOx3  Left foot: Sutures intact with no gapping or dehiscence at surgical site, mild swelling to left  foot, no erythema, no warmth, no drainage, no signs of infection noted, Capillary fill time <3 seconds in all digits, gross sensation present via light touch to left foot. No pain or crepitation with range of motion left foot.  No pain with calf compression.   Assessment and Plan:  Problem List Items Addressed This Visit    None    Visit Diagnoses    Hallux valgus of left foot    -  Primary   Hallux rigidus, left foot       S/P foot surgery, left           -Patient seen and evaluated -Sutures removed. Applied dry sterile dressing to surgical site left foot secured with ACE wrap and stockinet  -Advised patient to make sure to keep dressings clean, dry, and intact to left surgical site for 1 weeks after 1 week may shower and allow steristrips to fall off and reapply Ace wrap -Advised patient to continue with CAM boot and crutches until next office visit  -Advised patient to limit activity to necessity  -Advised patient to ice and elevate as necessary  -Continue Motrin  -Will plan for xray and d/c crutches next office visit. In the meantime, patient to call office if any issues or problems arise.   Landis Martins, DPM

## 2018-07-11 ENCOUNTER — Ambulatory Visit (INDEPENDENT_AMBULATORY_CARE_PROVIDER_SITE_OTHER): Payer: BC Managed Care – PPO

## 2018-07-11 ENCOUNTER — Encounter: Payer: Self-pay | Admitting: Sports Medicine

## 2018-07-11 ENCOUNTER — Ambulatory Visit (INDEPENDENT_AMBULATORY_CARE_PROVIDER_SITE_OTHER): Payer: BC Managed Care – PPO | Admitting: Sports Medicine

## 2018-07-11 DIAGNOSIS — M2012 Hallux valgus (acquired), left foot: Secondary | ICD-10-CM | POA: Diagnosis not present

## 2018-07-11 DIAGNOSIS — M2022 Hallux rigidus, left foot: Secondary | ICD-10-CM

## 2018-07-11 DIAGNOSIS — Z9889 Other specified postprocedural states: Secondary | ICD-10-CM

## 2018-07-11 NOTE — Progress Notes (Signed)
  Subjective: Rita Davis is a 57 y.o. female patient seen today in office for POV #3 (DOS 06-12-18), S/P L [place] bunionectomy. Patient denies pain to surgical site besides soreness at big toe joint.  Denies calf pain, denies headache, chest pain, shortness of breath, nausea, vomiting, fever, or chills. Patient states that she is doing well and is only taking Ibuprofen if needed. No other issues noted.   Patient Active Problem List   Diagnosis Date Noted  . Pneumonia 03/21/2018  . Fibroid tumor 03/10/2018  . Adjustment insomnia 11/26/2015  . Eczema 11/26/2015  . Other specified abnormal findings of blood chemistry 11/26/2015  . Healthcare maintenance 04/10/2014  . Anxiety state 02/28/2014  . OVERWEIGHT 03/27/2009  . Overweight 03/27/2009  . HYPERLIPIDEMIA 01/24/2009  . ANEMIA 11/20/2007  . HYPERTENSION 11/20/2007    Current Outpatient Medications on File Prior to Visit  Medication Sig Dispense Refill  . amLODipine-olmesartan (AZOR) 5-40 MG per tablet Take 1 tablet by mouth daily.    Marland Kitchen BELVIQ XR 20 MG TB24     . escitalopram (LEXAPRO) 10 MG tablet Take 10 mg by mouth as needed.     Marland Kitchen ibuprofen (ADVIL,MOTRIN) 800 MG tablet Take 1 tablet (800 mg total) by mouth every 8 (eight) hours as needed. 30 tablet 1  . methocarbamol (ROBAXIN) 500 MG tablet     . Multiple Vitamin (MULTI-VITAMINS) TABS Take by mouth.    . naproxen (NAPROSYN) 500 MG tablet     . promethazine (PHENERGAN) 25 MG tablet     . simvastatin (ZOCOR) 10 MG tablet Take 10 mg by mouth at bedtime. Pt stated, "I am not taking my medication, I am trying to manage my cholesterol myself" -- 03/21/18    . zolpidem (AMBIEN) 10 MG tablet Take 10 mg by mouth at bedtime as needed for sleep.     No current facility-administered medications on file prior to visit.     No Known Allergies  Objective: There were no vitals filed for this visit.  General: No acute distress, AAOx3  Left foot: Incision at surgical site well  healed, mild swelling to left foot, no erythema, no warmth, no drainage, no signs of infection noted, Capillary fill time <3 seconds in all digits, gross sensation present via light touch to left foot. No pain or crepitation with range of motion left foot. No pain with calf compression.   Xrays: Hardware intact, osteotomy healing well with no acute issues.   Assessment and Plan:  Problem List Items Addressed This Visit    None    Visit Diagnoses    Hallux valgus of left foot    -  Primary   Relevant Orders   DG Foot Complete Left (Completed)   Hallux rigidus, left foot       Relevant Orders   DG Foot Complete Left (Completed)   S/P foot surgery, left          -Patient seen and evaluated -Xrays reviewed   -Dispensed surgical compression sleeved -Dispensed post op shoe to use for 2 weeks and then may transition to normal tennis shoe -Advised patient to limit activity to tolerance  -Advised patient to ice and elevate as necessary  -Continue Motrin as needed and may start gentle range motion as instructed -Will plan for increasing activities at next office visit. In the meantime, patient to call office if any issues or problems arise.   Landis Martins, DPM

## 2018-08-03 NOTE — Progress Notes (Signed)
DOS: 06-12-2018 Young[place] Bunionectomy LT  [place]

## 2018-08-08 ENCOUNTER — Ambulatory Visit (INDEPENDENT_AMBULATORY_CARE_PROVIDER_SITE_OTHER): Payer: BC Managed Care – PPO | Admitting: Sports Medicine

## 2018-08-08 ENCOUNTER — Encounter: Payer: Self-pay | Admitting: Sports Medicine

## 2018-08-08 ENCOUNTER — Telehealth: Payer: Self-pay | Admitting: *Deleted

## 2018-08-08 DIAGNOSIS — M2012 Hallux valgus (acquired), left foot: Secondary | ICD-10-CM

## 2018-08-08 DIAGNOSIS — Z9889 Other specified postprocedural states: Secondary | ICD-10-CM

## 2018-08-08 DIAGNOSIS — M2022 Hallux rigidus, left foot: Secondary | ICD-10-CM

## 2018-08-08 NOTE — Progress Notes (Signed)
Subjective: Rita Davis is a 57 y.o. female patient seen today in office for POV #4 (DOS 06-12-18), S/P L [place] bunionectomy. Patient admits that there has been some stiffness and some increased swelling aside from that states that she feels like everything has been healing well states that she has done a lot of walking to visit her husband who is currently hospitalized and because of the increased swelling and the stiffness she returned back to using her cam boot, patient denies calf pain, denies headache, chest pain, shortness of breath, nausea, vomiting, fever, or chills. Patient states that she is doing well and is only taking Ibuprofen if needed and wearing her compression sleeve again because of the increased episode of swelling. No other issues noted.   Patient Active Problem List   Diagnosis Date Noted  . Pneumonia 03/21/2018  . Fibroid tumor 03/10/2018  . Adjustment insomnia 11/26/2015  . Eczema 11/26/2015  . Other specified abnormal findings of blood chemistry 11/26/2015  . Healthcare maintenance 04/10/2014  . Anxiety state 02/28/2014  . OVERWEIGHT 03/27/2009  . Overweight 03/27/2009  . HYPERLIPIDEMIA 01/24/2009  . ANEMIA 11/20/2007  . HYPERTENSION 11/20/2007    Current Outpatient Medications on File Prior to Visit  Medication Sig Dispense Refill  . amLODipine-olmesartan (AZOR) 5-40 MG per tablet Take 1 tablet by mouth daily.    Marland Kitchen BELVIQ XR 20 MG TB24     . betamethasone valerate (VALISONE) 0.1 % cream     . escitalopram (LEXAPRO) 10 MG tablet Take 10 mg by mouth as needed.     Marland Kitchen ibuprofen (ADVIL,MOTRIN) 800 MG tablet Take 1 tablet (800 mg total) by mouth every 8 (eight) hours as needed. 30 tablet 1  . methocarbamol (ROBAXIN) 500 MG tablet     . Multiple Vitamin (MULTI-VITAMINS) TABS Take by mouth.    . naproxen (NAPROSYN) 500 MG tablet     . promethazine (PHENERGAN) 25 MG tablet     . simvastatin (ZOCOR) 10 MG tablet Take 10 mg by mouth at bedtime. Pt stated, "I am  not taking my medication, I am trying to manage my cholesterol myself" -- 03/21/18    . zolpidem (AMBIEN) 10 MG tablet Take 10 mg by mouth at bedtime as needed for sleep.     No current facility-administered medications on file prior to visit.     No Known Allergies  Objective: There were no vitals filed for this visit.  General: No acute distress, AAOx3  Left foot: Incision at surgical site well healed, mild swelling to left foot, no erythema, no warmth, no drainage, no signs of infection noted, Capillary fill time <3 seconds in all digits, gross sensation present via light touch to left foot. No pain or crepitation with range of motion left foot. No pain with calf compression.   Assessment and Plan:  Problem List Items Addressed This Visit    None    Visit Diagnoses    Hallux valgus of left foot    -  Primary   Hallux rigidus, left foot       S/P foot surgery, left          -Patient seen and evaluated -Continue with surgical compression sleeve -May slowly transition to tennis shoe as tolerated however for long periods of standing or walking may continue with using her cam boot or postop shoe -Advised patient to limit activity to tolerance  -Advised patient to ice and elevate as necessary  -Continue Motrin as needed and may start gentle range motion  as instructed -Will plan for x-ray and follow-up to see how patient is doing since she has started therapy at next office visit. In the meantime, patient to call office if any issues or problems arise.  Advised patient for work may return on Monday at a reduced schedule of 4 hours with her postop shoe or cam boot for the next month.  Landis Martins, DPM

## 2018-08-08 NOTE — Telephone Encounter (Signed)
-----   Message from Landis Martins, [place] sent at 08/08/2018 10:47 AM EDT ----- Regarding: [facility] in office PT at [facility] s/p bunionectomy need help with mobility at 1st mtpj on left

## 2018-08-10 ENCOUNTER — Encounter: Payer: Self-pay | Admitting: Sports Medicine

## 2018-08-21 DIAGNOSIS — M79676 Pain in unspecified toe(s): Secondary | ICD-10-CM

## 2018-08-22 ENCOUNTER — Ambulatory Visit: Payer: BC Managed Care – PPO

## 2018-08-22 ENCOUNTER — Ambulatory Visit (INDEPENDENT_AMBULATORY_CARE_PROVIDER_SITE_OTHER): Payer: Self-pay | Admitting: Sports Medicine

## 2018-08-22 ENCOUNTER — Encounter: Payer: Self-pay | Admitting: Sports Medicine

## 2018-08-22 DIAGNOSIS — M2012 Hallux valgus (acquired), left foot: Secondary | ICD-10-CM

## 2018-08-22 DIAGNOSIS — M2022 Hallux rigidus, left foot: Secondary | ICD-10-CM

## 2018-08-22 DIAGNOSIS — M79672 Pain in left foot: Secondary | ICD-10-CM

## 2018-08-22 DIAGNOSIS — Z9889 Other specified postprocedural states: Secondary | ICD-10-CM

## 2018-08-22 NOTE — Progress Notes (Signed)
  Subjective: Rita Davis is a 57 y.o. female patient seen today in office for POV #5 (DOS 06-12-18), S/P L Young[place] bunionectomy. Patient admits to difficulty with motion, states her toe feels stiff, patient denies calf pain, denies headache, chest pain, shortness of breath, nausea, vomiting, fever, or chills. No other issues noted.   Patient Active Problem List   Diagnosis Date Noted  . Pneumonia 03/21/2018  . Fibroid tumor 03/10/2018  . Adjustment insomnia 11/26/2015  . Eczema 11/26/2015  . Other specified abnormal findings of blood chemistry 11/26/2015  . Healthcare maintenance 04/10/2014  . Anxiety state 02/28/2014  . OVERWEIGHT 03/27/2009  . Overweight 03/27/2009  . HYPERLIPIDEMIA 01/24/2009  . ANEMIA 11/20/2007  . HYPERTENSION 11/20/2007    Current Outpatient Medications on File Prior to Visit  Medication Sig Dispense Refill  . amLODipine-olmesartan (AZOR) 5-40 MG per tablet Take 1 tablet by mouth daily.    Marland Kitchen BELVIQ XR 20 MG TB24     . betamethasone valerate (VALISONE) 0.1 % cream     . escitalopram (LEXAPRO) 10 MG tablet Take 10 mg by mouth as needed.     Marland Kitchen ibuprofen (ADVIL,MOTRIN) 800 MG tablet Take 1 tablet (800 mg total) by mouth every 8 (eight) hours as needed. 30 tablet 1  . methocarbamol (ROBAXIN) 500 MG tablet     . Multiple Vitamin (MULTI-VITAMINS) TABS Take by mouth.    . naproxen (NAPROSYN) 500 MG tablet     . promethazine (PHENERGAN) 25 MG tablet     . simvastatin (ZOCOR) 10 MG tablet Take 10 mg by mouth at bedtime. Pt stated, "I am not taking my medication, I am trying to manage my cholesterol myself" -- 03/21/18    . zolpidem (AMBIEN) 10 MG tablet Take 10 mg by mouth at bedtime as needed for sleep.     No current facility-administered medications on file prior to visit.     No Known Allergies  Objective: There were no vitals filed for this visit.  General: No acute distress, AAOx3  Left foot: Incision at surgical site well healed, mild swelling to  left foot, no erythema, no warmth, no drainage, no signs of infection noted, Capillary fill time <3 seconds in all digits, gross sensation present via light touch to left foot. No pain or crepitation with range of motion left foot, normal passive but limited active range of motion. No pain with calf compression.   Assessment and Plan:  Problem List Items Addressed This Visit    None    Visit Diagnoses    S/P foot surgery, left    -  Primary   Hallux valgus of left foot       Hallux rigidus, left foot       Left foot pain          -Patient seen and evaluated -Continue with surgical compression sleeve -May slowly transition to tennis shoe as tolerated however for long periods of standing or walking may continue with using her postop shoe as previously recommended  -Advised patient to limit activity to tolerance  -Advised patient to ice and elevate as necessary  -Continue Motrin as needed  -Patient to see physical therapist today  -Will plan for x-ray and follow-up to see how patient is doing since she has started therapy and eval return to work at next office visit. In the meantime, patient to call office if any issues or problems arise.  Landis Martins, DPM

## 2018-09-07 ENCOUNTER — Encounter: Payer: Self-pay | Admitting: Sports Medicine

## 2018-09-12 ENCOUNTER — Ambulatory Visit (INDEPENDENT_AMBULATORY_CARE_PROVIDER_SITE_OTHER): Payer: BC Managed Care – PPO

## 2018-09-12 ENCOUNTER — Encounter: Payer: Self-pay | Admitting: Sports Medicine

## 2018-09-12 ENCOUNTER — Ambulatory Visit (INDEPENDENT_AMBULATORY_CARE_PROVIDER_SITE_OTHER): Payer: BC Managed Care – PPO | Admitting: Sports Medicine

## 2018-09-12 DIAGNOSIS — M2012 Hallux valgus (acquired), left foot: Secondary | ICD-10-CM

## 2018-09-12 DIAGNOSIS — Z9889 Other specified postprocedural states: Secondary | ICD-10-CM

## 2018-09-12 DIAGNOSIS — M2022 Hallux rigidus, left foot: Secondary | ICD-10-CM

## 2018-09-12 DIAGNOSIS — M79672 Pain in left foot: Secondary | ICD-10-CM

## 2018-09-12 NOTE — Progress Notes (Signed)
Subjective: Rita Davis is a 57 y.o. female patient seen today in office for POV #6 (DOS 06-12-18), S/P L Young[place] bunionectomy. Patient admits to still some weakness and stiffness across the big toe joint and some swelling, patient denies calf pain, denies headache, chest pain, shortness of breath, nausea, vomiting, fever, or chills. No other issues noted.   Patient Active Problem List   Diagnosis Date Noted  . Pneumonia 03/21/2018  . Fibroid tumor 03/10/2018  . Adjustment insomnia 11/26/2015  . Eczema 11/26/2015  . Other specified abnormal findings of blood chemistry 11/26/2015  . Healthcare maintenance 04/10/2014  . Anxiety state 02/28/2014  . OVERWEIGHT 03/27/2009  . Overweight 03/27/2009  . HYPERLIPIDEMIA 01/24/2009  . ANEMIA 11/20/2007  . HYPERTENSION 11/20/2007    Current Outpatient Medications on File Prior to Visit  Medication Sig Dispense Refill  . amLODipine-olmesartan (AZOR) 5-40 MG per tablet Take 1 tablet by mouth daily.    Marland Kitchen BELVIQ XR 20 MG TB24     . betamethasone valerate (VALISONE) 0.1 % cream     . escitalopram (LEXAPRO) 10 MG tablet Take 10 mg by mouth as needed.     Marland Kitchen ibuprofen (ADVIL,MOTRIN) 800 MG tablet Take 1 tablet (800 mg total) by mouth every 8 (eight) hours as needed. 30 tablet 1  . methocarbamol (ROBAXIN) 500 MG tablet     . Multiple Vitamin (MULTI-VITAMINS) TABS Take by mouth.    . naproxen (NAPROSYN) 500 MG tablet     . promethazine (PHENERGAN) 25 MG tablet     . simvastatin (ZOCOR) 10 MG tablet Take 10 mg by mouth at bedtime. Pt stated, "I am not taking my medication, I am trying to manage my cholesterol myself" -- 03/21/18    . zolpidem (AMBIEN) 10 MG tablet Take 10 mg by mouth at bedtime as needed for sleep.     No current facility-administered medications on file prior to visit.     No Known Allergies  Objective: There were no vitals filed for this visit.  General: No acute distress, AAOx3  Left foot: Incision at surgical site well  healed, mild swelling to left foot, no erythema, no warmth, no drainage, no signs of infection noted, Capillary fill time <3 seconds in all digits, gross sensation present via light touch to left foot. No pain or crepitation with range of motion left foot, normal passive but limited active range of motion as previous however does appear to be a little improved after starting therapy. No pain with calf compression.   Xray, Left foot: consistent with post op status with hardware intact joint space open no acute findings at the area of surgical site  Assessment and Plan:  Problem List Items Addressed This Visit    None    Visit Diagnoses    Hallux valgus of left foot    -  Primary   Relevant Orders   DG Foot Complete Left   Hallux rigidus, left foot       Left foot pain       S/P foot surgery, left         -Patient seen and evaluated -X-rays reviewed -Continue with range of motion exercises as taught by physical therapy -Cont with tennis shoe  -Advised patient to limit activity to tolerance  -Advised patient to ice and elevate as necessary  -Continue Motrin as needed  -Patient may return to work on December 2 working half days consisting of a shift of about 4-1/2 hours until next office visit and then  will slowly increase her hours depending on how she is doing -Will plan for final POV check, increasing hours at work and final check to see how physical therapy is going. In the meantime, patient to call office if any issues or problems arise.  Landis Martins, DPM

## 2018-10-10 ENCOUNTER — Encounter: Payer: Self-pay | Admitting: Sports Medicine

## 2018-10-10 ENCOUNTER — Ambulatory Visit (INDEPENDENT_AMBULATORY_CARE_PROVIDER_SITE_OTHER): Payer: BC Managed Care – PPO | Admitting: Sports Medicine

## 2018-10-10 DIAGNOSIS — M2022 Hallux rigidus, left foot: Secondary | ICD-10-CM

## 2018-10-10 DIAGNOSIS — M2012 Hallux valgus (acquired), left foot: Secondary | ICD-10-CM | POA: Diagnosis not present

## 2018-10-10 DIAGNOSIS — Z9889 Other specified postprocedural states: Secondary | ICD-10-CM | POA: Diagnosis not present

## 2018-10-10 DIAGNOSIS — M79672 Pain in left foot: Secondary | ICD-10-CM

## 2018-10-10 NOTE — Progress Notes (Signed)
Subjective: Rita Davis is a 57 y.o. female patient seen today in office for POV #7 (DOS 06-12-18), S/P L Youngswick bunionectomy. Patient admits to still some stiffness but otherwise she is doing very well with no limp at all and a little bit of soreness but she is completed most of her physical therapy and is back at work., patient denies calf pain, denies headache, chest pain, shortness of breath, nausea, vomiting, fever, or chills. No other issues noted.   Patient Active Problem List   Diagnosis Date Noted  . Pneumonia 03/21/2018  . Fibroid tumor 03/10/2018  . Adjustment insomnia 11/26/2015  . Eczema 11/26/2015  . Other specified abnormal findings of blood chemistry 11/26/2015  . Healthcare maintenance 04/10/2014  . Anxiety state 02/28/2014  . OVERWEIGHT 03/27/2009  . Overweight 03/27/2009  . HYPERLIPIDEMIA 01/24/2009  . ANEMIA 11/20/2007  . HYPERTENSION 11/20/2007    Current Outpatient Medications on File Prior to Visit  Medication Sig Dispense Refill  . amLODipine-olmesartan (AZOR) 5-40 MG per tablet Take 1 tablet by mouth daily.    Marland Kitchen BELVIQ XR 20 MG TB24     . betamethasone valerate (VALISONE) 0.1 % cream     . escitalopram (LEXAPRO) 10 MG tablet Take 10 mg by mouth as needed.     Marland Kitchen ibuprofen (ADVIL,MOTRIN) 800 MG tablet Take 1 tablet (800 mg total) by mouth every 8 (eight) hours as needed. 30 tablet 1  . methocarbamol (ROBAXIN) 500 MG tablet     . Multiple Vitamin (MULTI-VITAMINS) TABS Take by mouth.    . naproxen (NAPROSYN) 500 MG tablet     . promethazine (PHENERGAN) 25 MG tablet     . simvastatin (ZOCOR) 10 MG tablet Take 10 mg by mouth at bedtime. Pt stated, "I am not taking my medication, I am trying to manage my cholesterol myself" -- 03/21/18    . zolpidem (AMBIEN) 10 MG tablet Take 10 mg by mouth at bedtime as needed for sleep.     No current facility-administered medications on file prior to visit.     No Known Allergies  Objective: There were no vitals  filed for this visit.  General: No acute distress, AAOx3  Left foot: Incision at surgical site well healed, mild keloid formation but still supple, mild swelling to left foot, no erythema, no warmth, no drainage, no signs of infection noted, Capillary fill time <3 seconds in all digits, gross sensation present via light touch to left foot. No pain or crepitation with range of motion left foot, and at the big toe joint the range of motion appears to be much improved. No pain with calf compression.   Assessment and Plan:  Problem List Items Addressed This Visit    None    Visit Diagnoses    Hallux valgus of left foot    -  Primary   Hallux rigidus, left foot       Left foot pain       S/P foot surgery, left         -Patient seen and evaluated -Surgical site well healed  -Continue with range of motion exercises as taught by physical therapy -Cont with good supportive shoes  -Advised patient to limit activity to tolerance  -Advised patient to  Continue with compression sleeve as needed  -Work note full duty with no restrictions and may limit standing to 1 hour increments with 15-minute rest in between -Return as needed. In the meantime, patient to call office if any issues or problems arise.  Landis Martins, DPM

## 2019-05-10 ENCOUNTER — Other Ambulatory Visit: Payer: Self-pay | Admitting: Family Medicine

## 2019-05-10 DIAGNOSIS — Z1231 Encounter for screening mammogram for malignant neoplasm of breast: Secondary | ICD-10-CM

## 2019-06-25 ENCOUNTER — Ambulatory Visit
Admission: RE | Admit: 2019-06-25 | Discharge: 2019-06-25 | Disposition: A | Discharge status: Home / Self Care | Payer: BC Managed Care – PPO | Source: Ambulatory Visit | Attending: Family Medicine | Admitting: Family Medicine

## 2019-06-25 ENCOUNTER — Other Ambulatory Visit: Payer: Self-pay

## 2019-06-25 DIAGNOSIS — Z1231 Encounter for screening mammogram for malignant neoplasm of breast: Secondary | ICD-10-CM

## 2019-06-25 IMAGING — MG DIGITAL SCREENING BILATERAL MAMMOGRAM WITH TOMO AND CAD
6 of 10 series · 6 of 30 positions shown · non-contrast
Comparison: Previous exam(s).

CLINICAL DATA: Screening.

EXAM:
DIGITAL SCREENING BILATERAL MAMMOGRAM WITH TOMO AND CAD

[R CC synth-2D (1 of 2)]
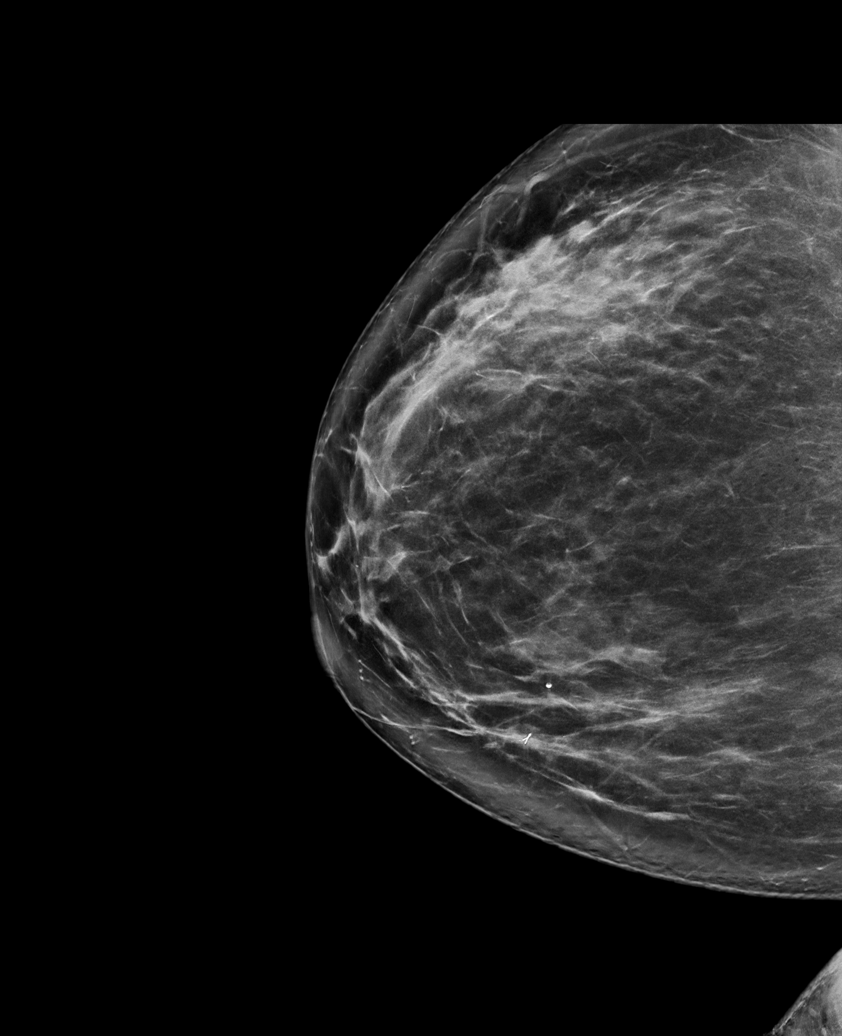

[R MLO synth-2D]
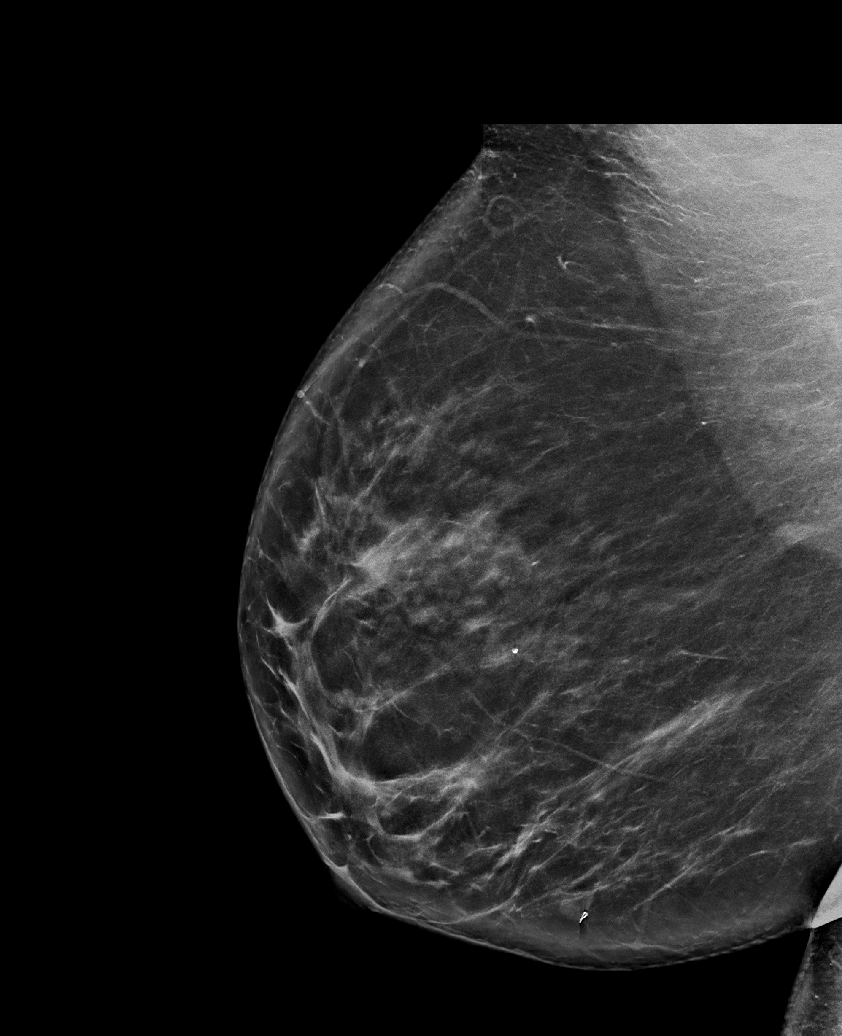

[L MLO synth-2D]
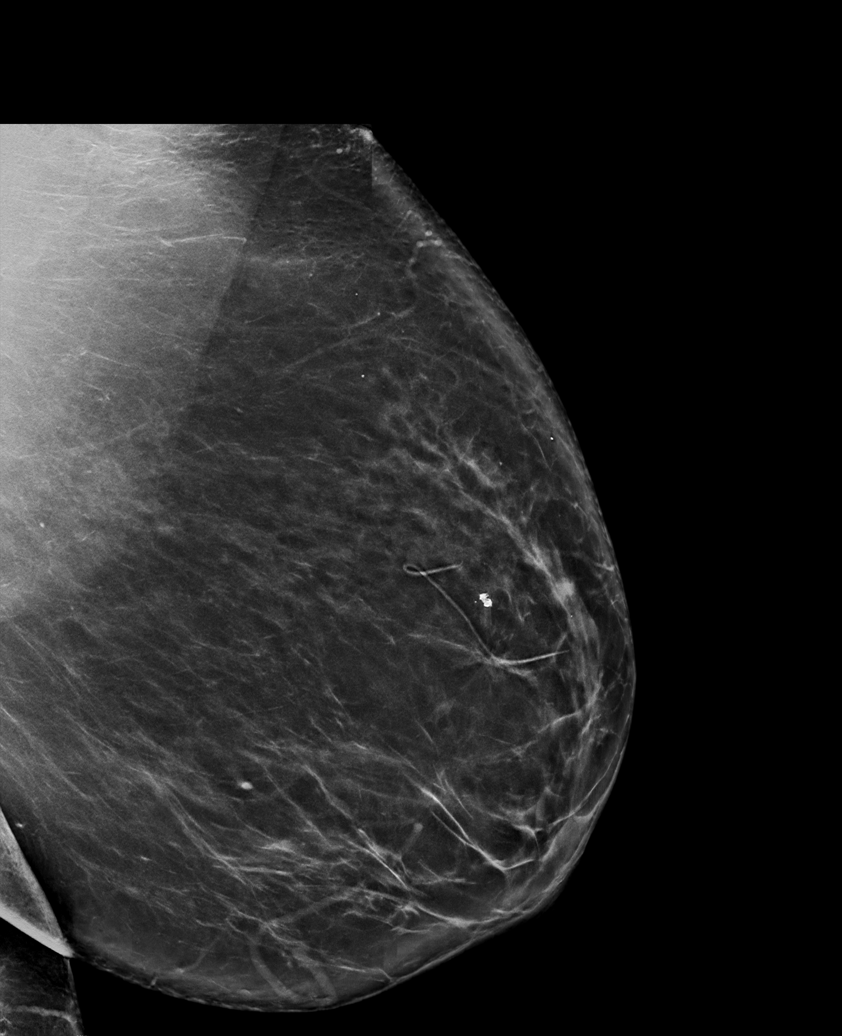

[L CC synth-2D]
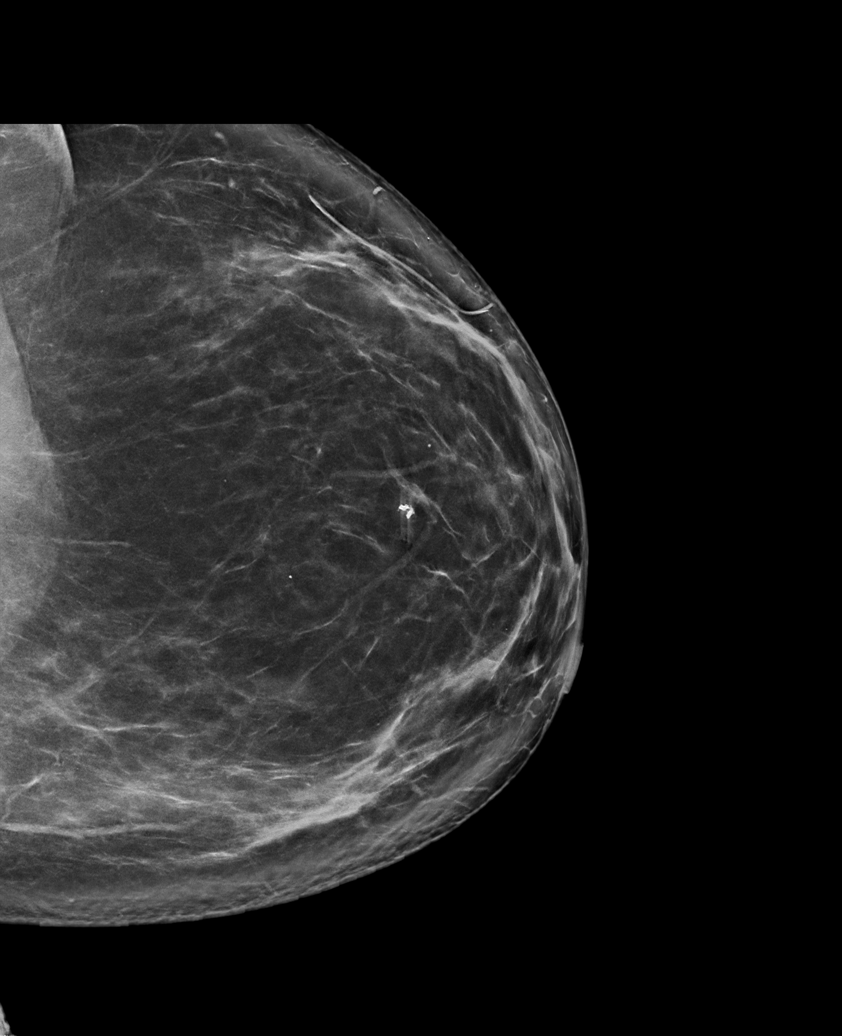

[R CC synth-2D (2 of 2)]
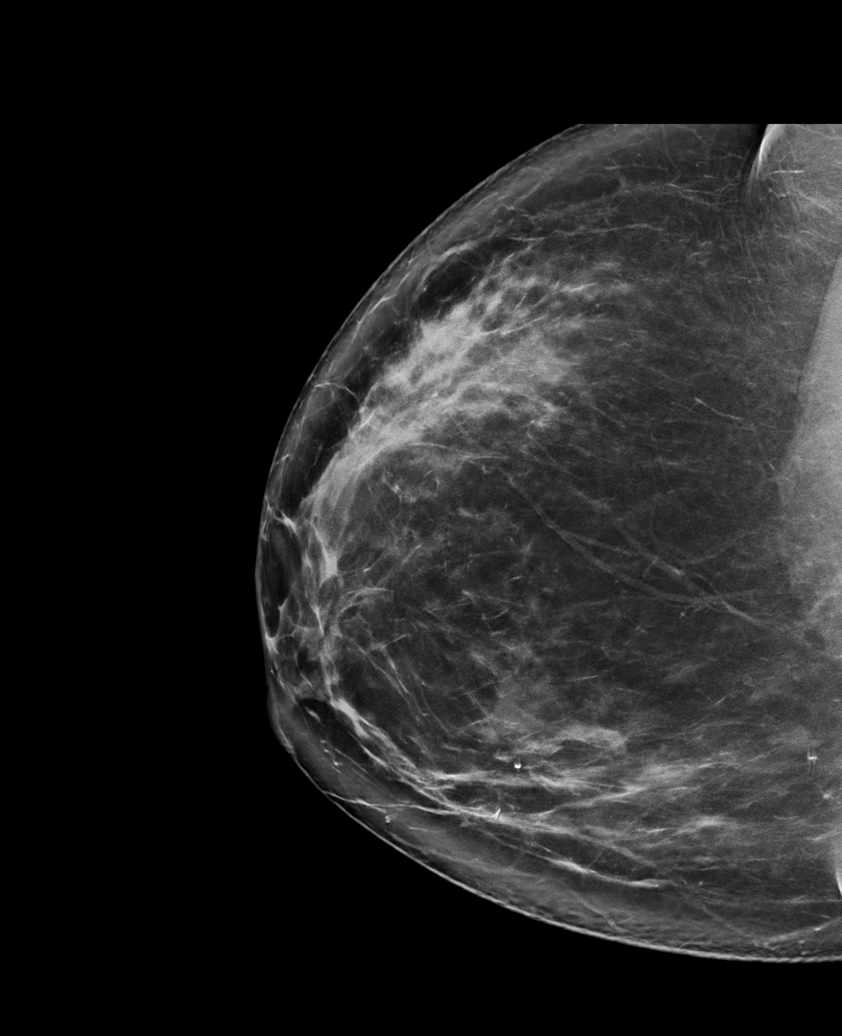

[R MLO tomo · tomo slice 51/102.0]
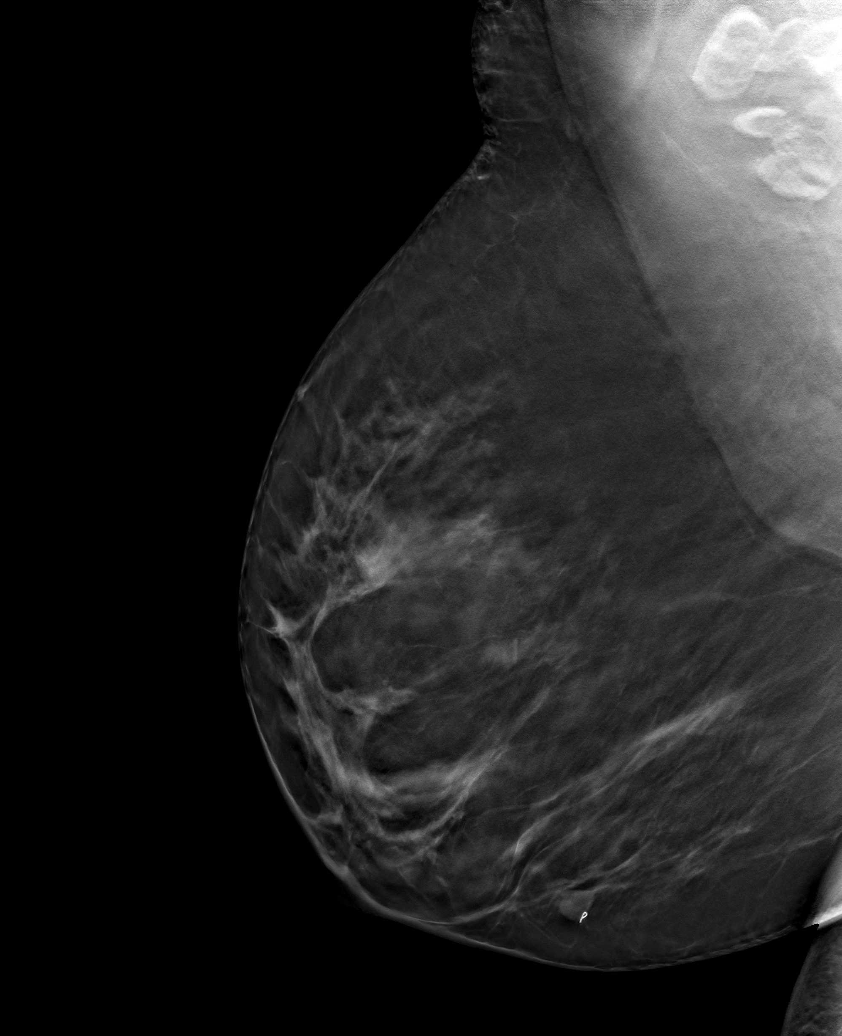

[6 of 30 positions shown; findings below may reference images not displayed]

ACR Breast Density Category b: There are scattered areas of
fibroglandular density.
FINDINGS: There are no findings suspicious for malignancy. Images were
processed with CAD.
IMPRESSION: No mammographic evidence of malignancy. A result letter of this
screening mammogram will be mailed directly to the patient.

RECOMMENDATION:
Screening mammogram in one year. (Code:[TQ])

BI-RADS CATEGORY  1: Negative.

## 2020-05-12 ENCOUNTER — Other Ambulatory Visit: Payer: Self-pay | Admitting: Family Medicine

## 2020-05-12 DIAGNOSIS — Z1231 Encounter for screening mammogram for malignant neoplasm of breast: Secondary | ICD-10-CM

## 2020-06-25 ENCOUNTER — Other Ambulatory Visit: Payer: Self-pay

## 2020-06-25 ENCOUNTER — Ambulatory Visit: Payer: BC Managed Care – PPO

## 2020-06-25 ENCOUNTER — Ambulatory Visit
Admission: RE | Admit: 2020-06-25 | Discharge: 2020-06-25 | Disposition: A | Discharge status: Home / Self Care | Payer: BC Managed Care – PPO | Source: Ambulatory Visit | Attending: Family Medicine | Admitting: Family Medicine

## 2020-06-25 DIAGNOSIS — Z1231 Encounter for screening mammogram for malignant neoplasm of breast: Secondary | ICD-10-CM

## 2020-06-25 IMAGING — MG DIGITAL SCREENING BILAT W/ TOMO W/ CAD
8 series · 8 of 24 positions shown · non-contrast
Comparison: Previous exam(s).

CLINICAL DATA: Screening.

EXAM:
DIGITAL SCREENING BILATERAL MAMMOGRAM WITH TOMO AND CAD

[L CC synth-2D]
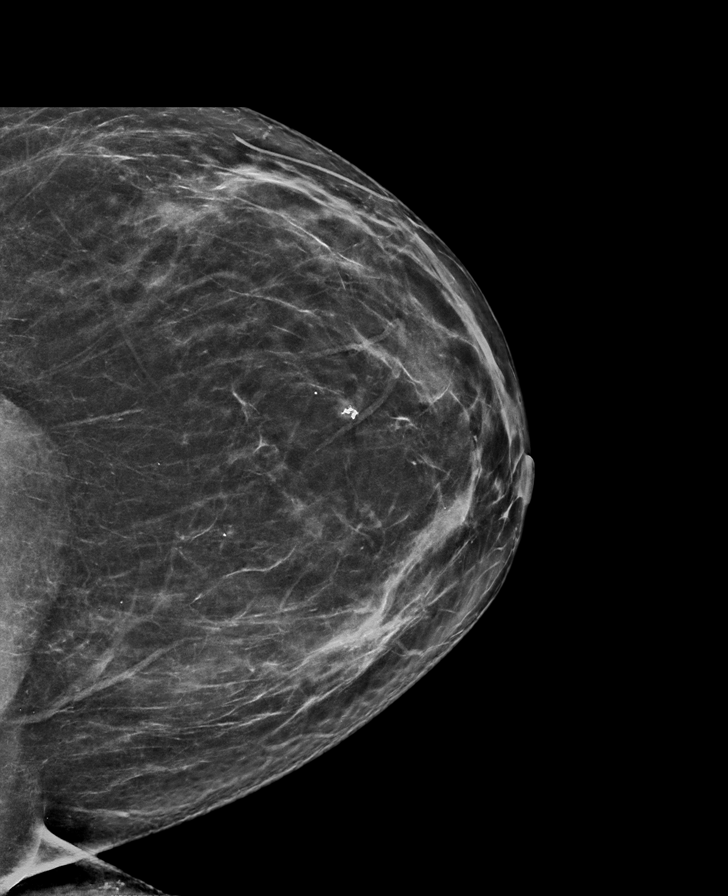

[L MLO synth-2D]
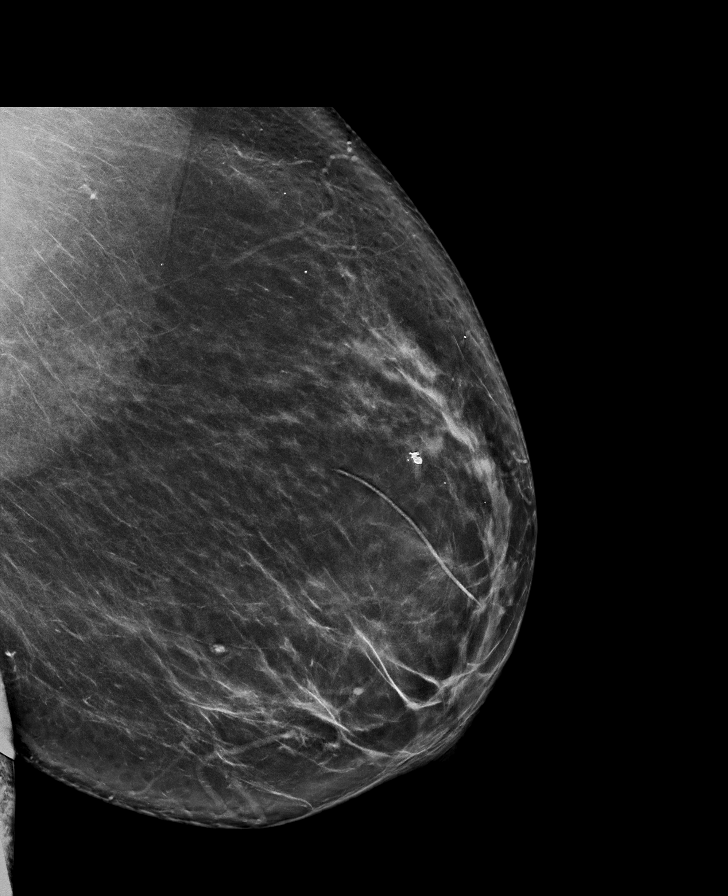

[R CC synth-2D]
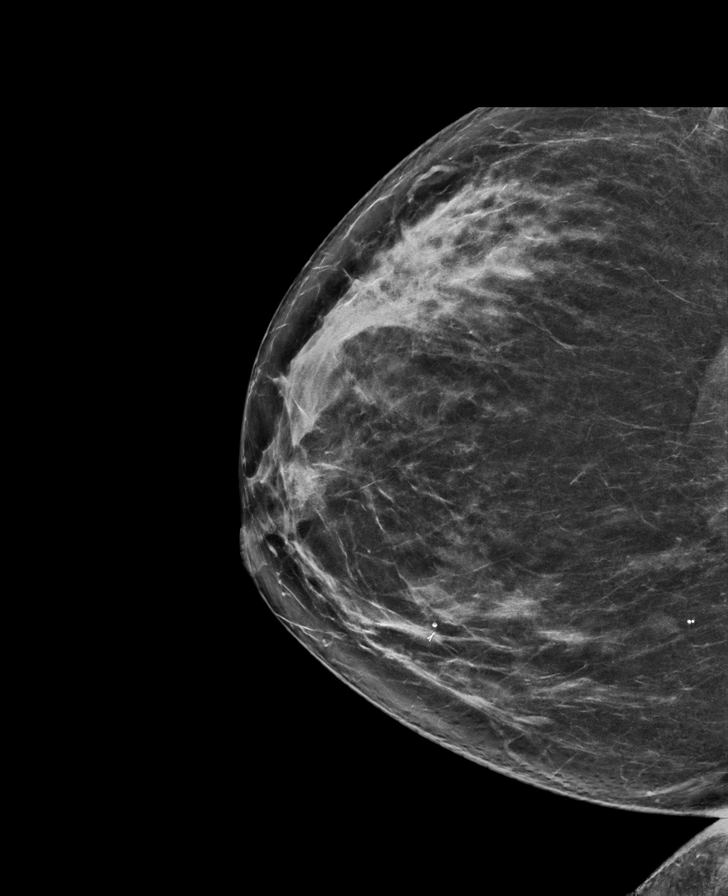

[R MLO synth-2D]
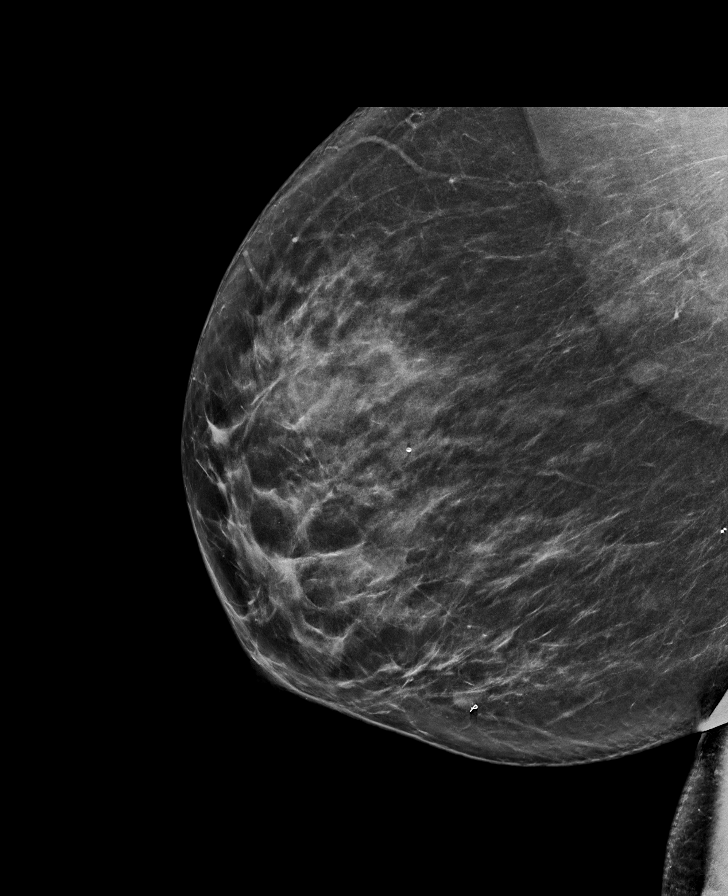

[R CC tomo · tomo slice 41/82.0]
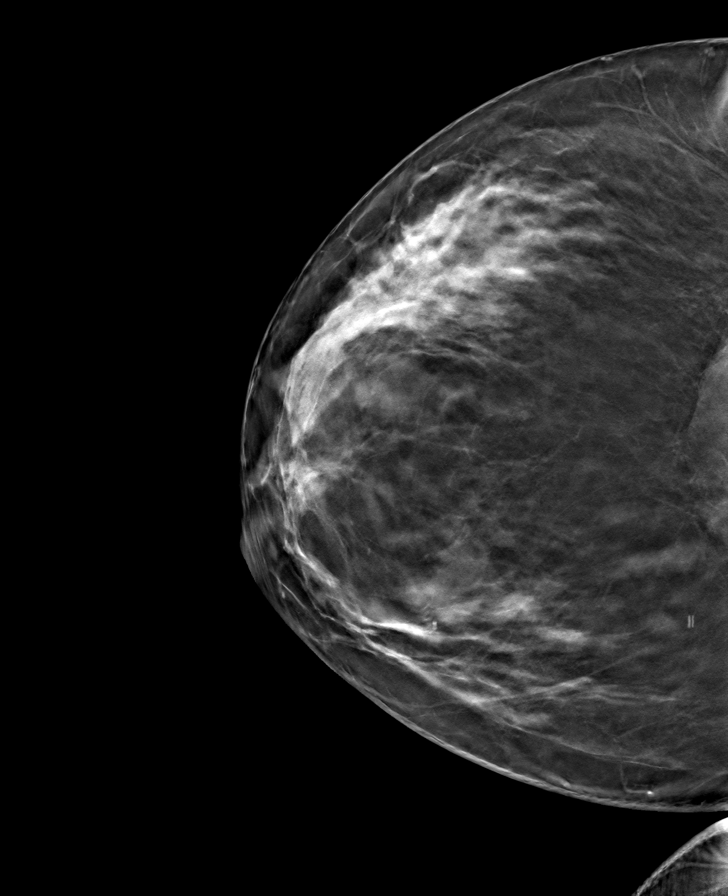

[R MLO tomo · tomo slice 45/88.0]
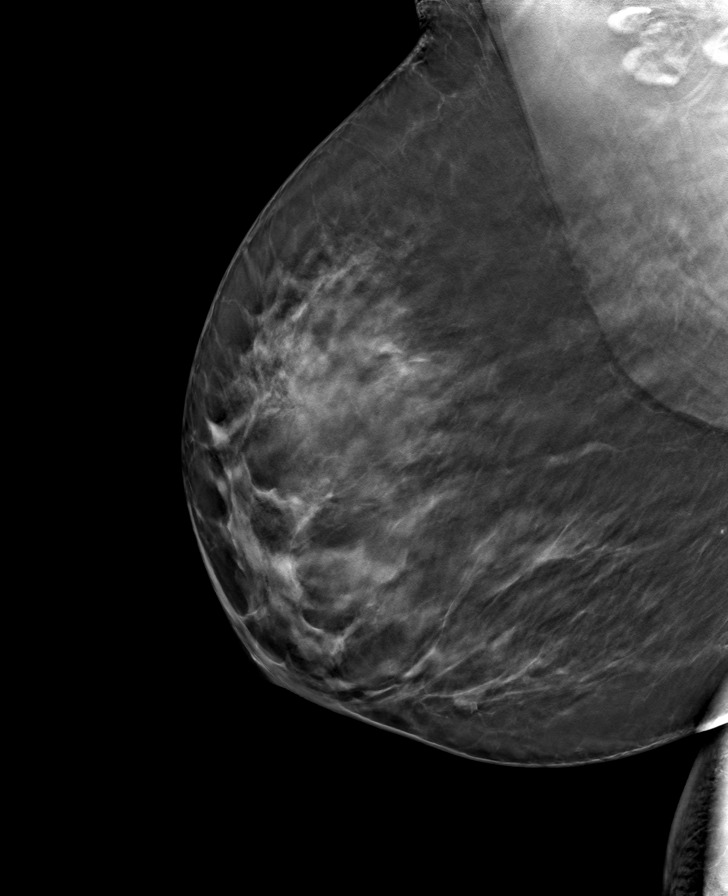

[L MLO tomo · tomo slice 47/93.0]
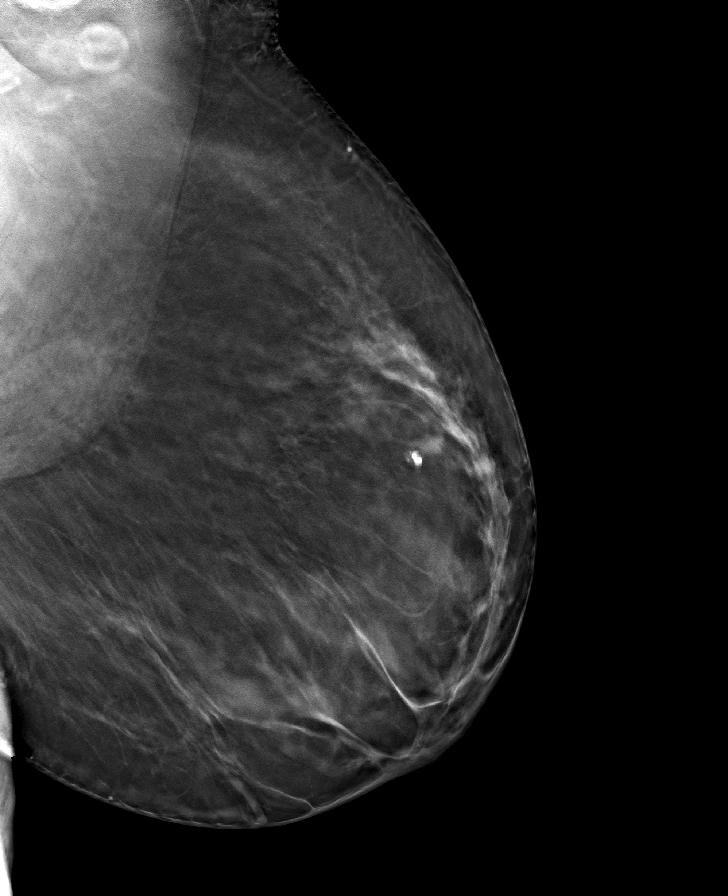

[L CC tomo · tomo slice 42/83.0]
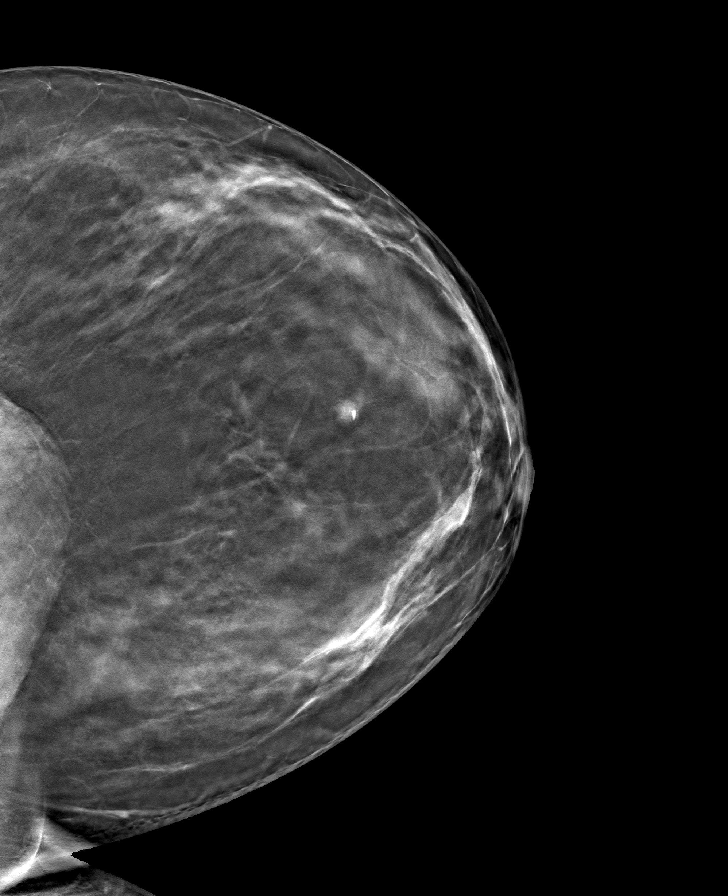

[8 of 24 positions shown; findings below may reference images not displayed]

ACR Breast Density Category c: The breast tissue is heterogeneously
dense, which may obscure small masses.
FINDINGS: There are no findings suspicious for malignancy. Images were
processed with CAD.
IMPRESSION: No mammographic evidence of malignancy. A result letter of this
screening mammogram will be mailed directly to the patient.

RECOMMENDATION:
Screening mammogram in one year. (Code:[5V])

BI-RADS CATEGORY  1: Negative.

## 2020-11-07 ENCOUNTER — Other Ambulatory Visit: Payer: Self-pay | Admitting: Orthopaedic Surgery

## 2020-11-10 ENCOUNTER — Other Ambulatory Visit (HOSPITAL_COMMUNITY)
Admission: RE | Admit: 2020-11-10 | Discharge: 2020-11-10 | Disposition: A | Discharge status: Home / Self Care | Payer: Self-pay | Source: Ambulatory Visit | Attending: Orthopaedic Surgery | Admitting: Orthopaedic Surgery

## 2020-11-10 ENCOUNTER — Encounter (HOSPITAL_BASED_OUTPATIENT_CLINIC_OR_DEPARTMENT_OTHER): Payer: Self-pay | Admitting: Orthopaedic Surgery

## 2020-11-10 ENCOUNTER — Encounter (HOSPITAL_BASED_OUTPATIENT_CLINIC_OR_DEPARTMENT_OTHER)
Admission: RE | Admit: 2020-11-10 | Discharge: 2020-11-10 | Disposition: A | Discharge status: Home / Self Care | Payer: Self-pay | Source: Ambulatory Visit | Attending: Orthopaedic Surgery | Admitting: Orthopaedic Surgery

## 2020-11-10 DIAGNOSIS — Z01818 Encounter for other preprocedural examination: Secondary | ICD-10-CM | POA: Insufficient documentation

## 2020-11-10 DIAGNOSIS — Z01812 Encounter for preprocedural laboratory examination: Secondary | ICD-10-CM | POA: Insufficient documentation

## 2020-11-10 DIAGNOSIS — Z20822 Contact with and (suspected) exposure to covid-19: Secondary | ICD-10-CM | POA: Insufficient documentation

## 2020-11-10 LAB — SARS CORONAVIRUS 2 (TAT 6-24 HRS): SARS Coronavirus 2: NEGATIVE

## 2020-11-10 LAB — BASIC METABOLIC PANEL
Anion gap: 11 (ref 5–15)
BUN: 12 mg/dL (ref 6–20)
CO2: 25 mmol/L (ref 22–32)
Calcium: 9.5 mg/dL (ref 8.9–10.3)
Chloride: 101 mmol/L (ref 98–111)
Creatinine, Ser: 0.96 mg/dL (ref 0.44–1.00)
GFR, Estimated: >60 mL/min (ref >60)
Glucose, Bld: 105 mg/dL — ABNORMAL HIGH (ref 70–99)
Potassium: 4.2 mmol/L (ref 3.5–5.1)
Sodium: 137 mmol/L (ref 135–145)

## 2020-11-10 NOTE — Progress Notes (Signed)
EKG reviewed by Dr. Miller, will proceed with surgery as scheduled.  

## 2020-11-13 ENCOUNTER — Encounter (HOSPITAL_BASED_OUTPATIENT_CLINIC_OR_DEPARTMENT_OTHER)
Admission: RE | Disposition: A | Discharge status: Home / Self Care | Payer: Self-pay | Source: Home / Self Care | Attending: Orthopaedic Surgery

## 2020-11-13 ENCOUNTER — Other Ambulatory Visit: Payer: Self-pay

## 2020-11-13 ENCOUNTER — Ambulatory Visit (HOSPITAL_BASED_OUTPATIENT_CLINIC_OR_DEPARTMENT_OTHER): Payer: No Typology Code available for payment source | Admitting: Anesthesiology

## 2020-11-13 ENCOUNTER — Ambulatory Visit (HOSPITAL_COMMUNITY): Payer: No Typology Code available for payment source

## 2020-11-13 ENCOUNTER — Ambulatory Visit (HOSPITAL_BASED_OUTPATIENT_CLINIC_OR_DEPARTMENT_OTHER)
Admission: RE | Admit: 2020-11-13 | Discharge: 2020-11-13 | Disposition: A | Discharge status: Home / Self Care | Payer: No Typology Code available for payment source | Attending: Orthopaedic Surgery | Admitting: Orthopaedic Surgery

## 2020-11-13 ENCOUNTER — Encounter (HOSPITAL_BASED_OUTPATIENT_CLINIC_OR_DEPARTMENT_OTHER): Payer: Self-pay | Admitting: Orthopaedic Surgery

## 2020-11-13 DIAGNOSIS — S82852A Displaced trimalleolar fracture of left lower leg, initial encounter for closed fracture: Secondary | ICD-10-CM | POA: Diagnosis not present

## 2020-11-13 DIAGNOSIS — Y990 Civilian activity done for income or pay: Secondary | ICD-10-CM | POA: Insufficient documentation

## 2020-11-13 DIAGNOSIS — Z7984 Long term (current) use of oral hypoglycemic drugs: Secondary | ICD-10-CM | POA: Diagnosis not present

## 2020-11-13 DIAGNOSIS — Z4789 Encounter for other orthopedic aftercare: Secondary | ICD-10-CM

## 2020-11-13 DIAGNOSIS — Z79899 Other long term (current) drug therapy: Secondary | ICD-10-CM | POA: Diagnosis not present

## 2020-11-13 DIAGNOSIS — W19XXXA Unspecified fall, initial encounter: Secondary | ICD-10-CM | POA: Diagnosis not present

## 2020-11-13 HISTORY — DX: Type 2 diabetes mellitus without complications: E11.9

## 2020-11-13 HISTORY — PX: ORIF ANKLE FRACTURE: SHX5408

## 2020-11-13 LAB — GLUCOSE, CAPILLARY
Glucose-Capillary: 97 mg/dL (ref 70–99)
Glucose-Capillary: 111 mg/dL — ABNORMAL HIGH (ref 70–99)

## 2020-11-13 IMAGING — RF DG C-ARM 1-60 MIN
1 series · 4 of 4 positions shown · non-contrast
Comparison: None.

CLINICAL DATA: ORIF trimalleolar ankle fracture

EXAM:
DG C-ARM 1-60 MIN; LEFT ANKLE COMPLETE - 3+ VIEW
FLUOROSCOPY TIME:  Fluoroscopy Time:  31 seconds
Cumulative air kerma: 0.5 mGy.

[Series 1: run · 4 of 4 slices shown]
[im 1/4]
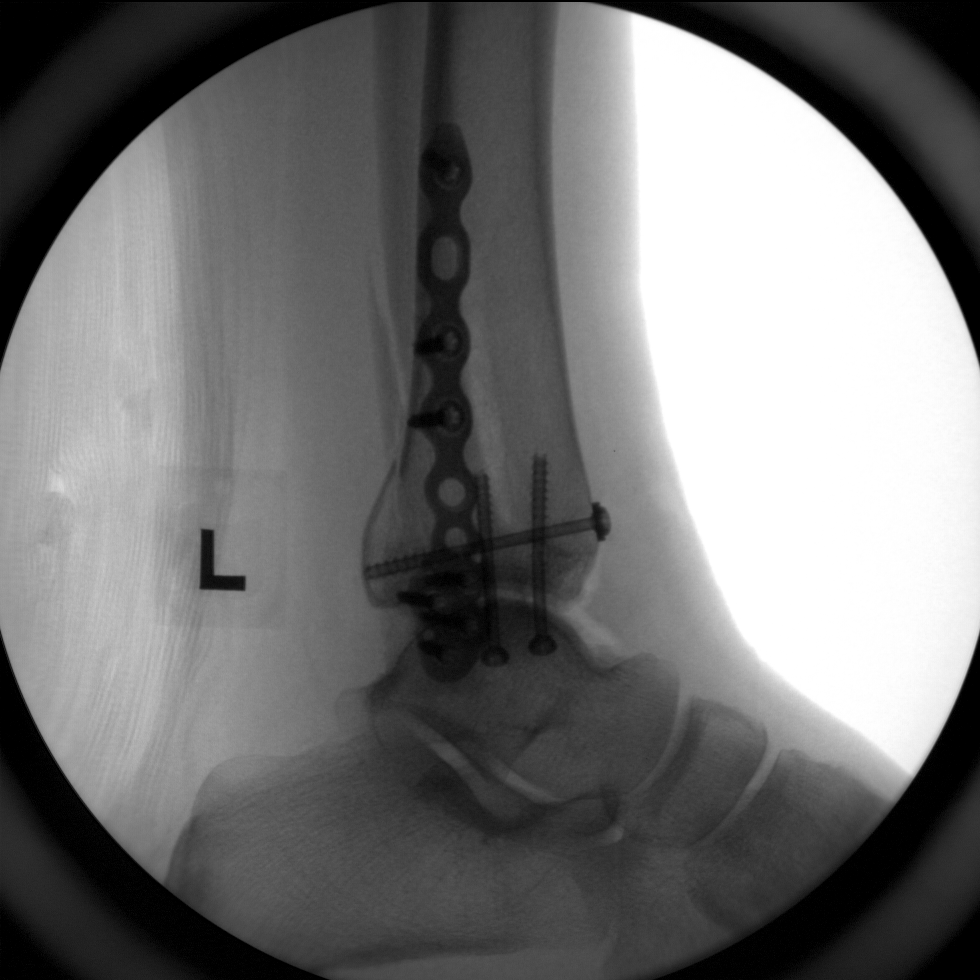
[im 2/4]
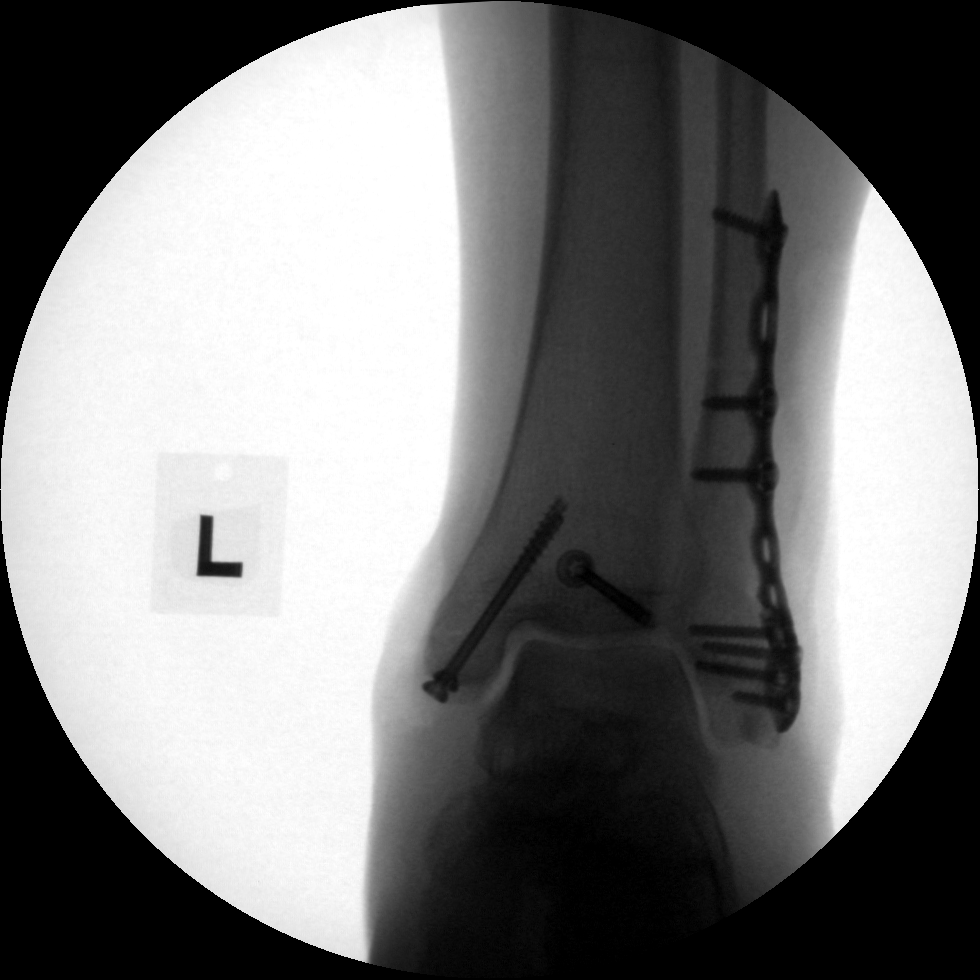
[im 3/4]
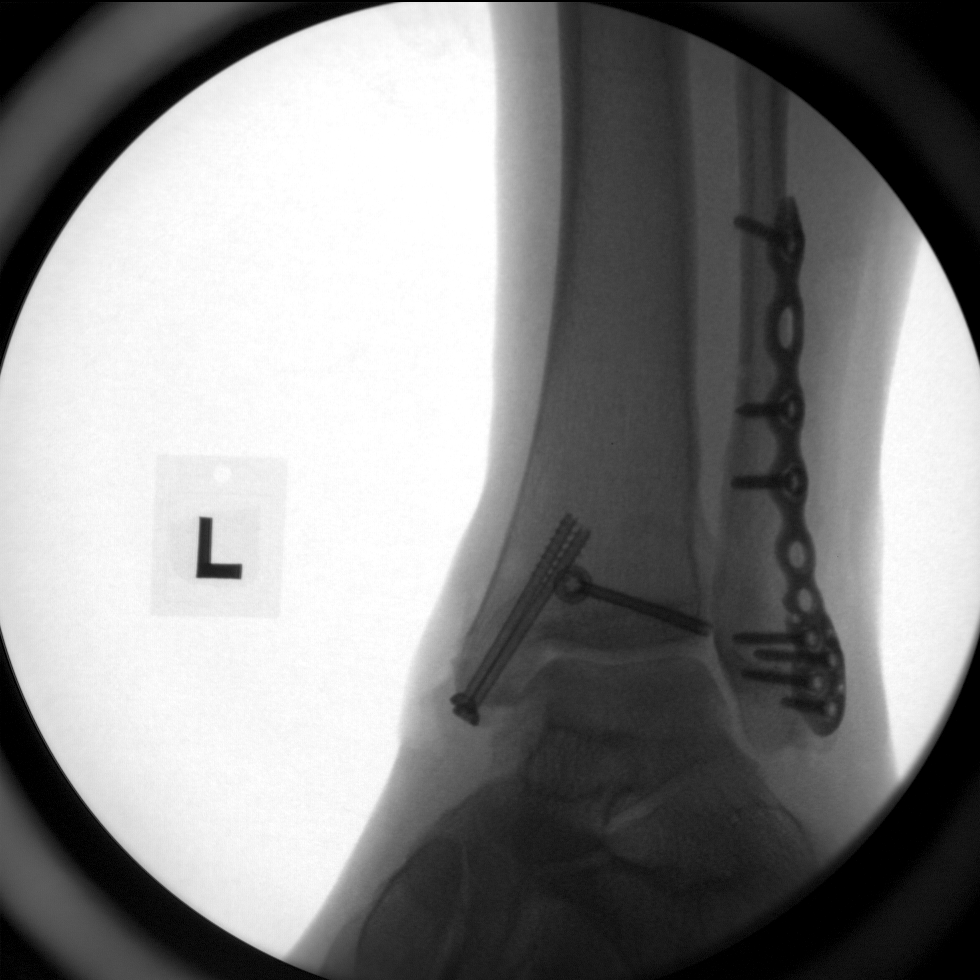
[im 4/4]
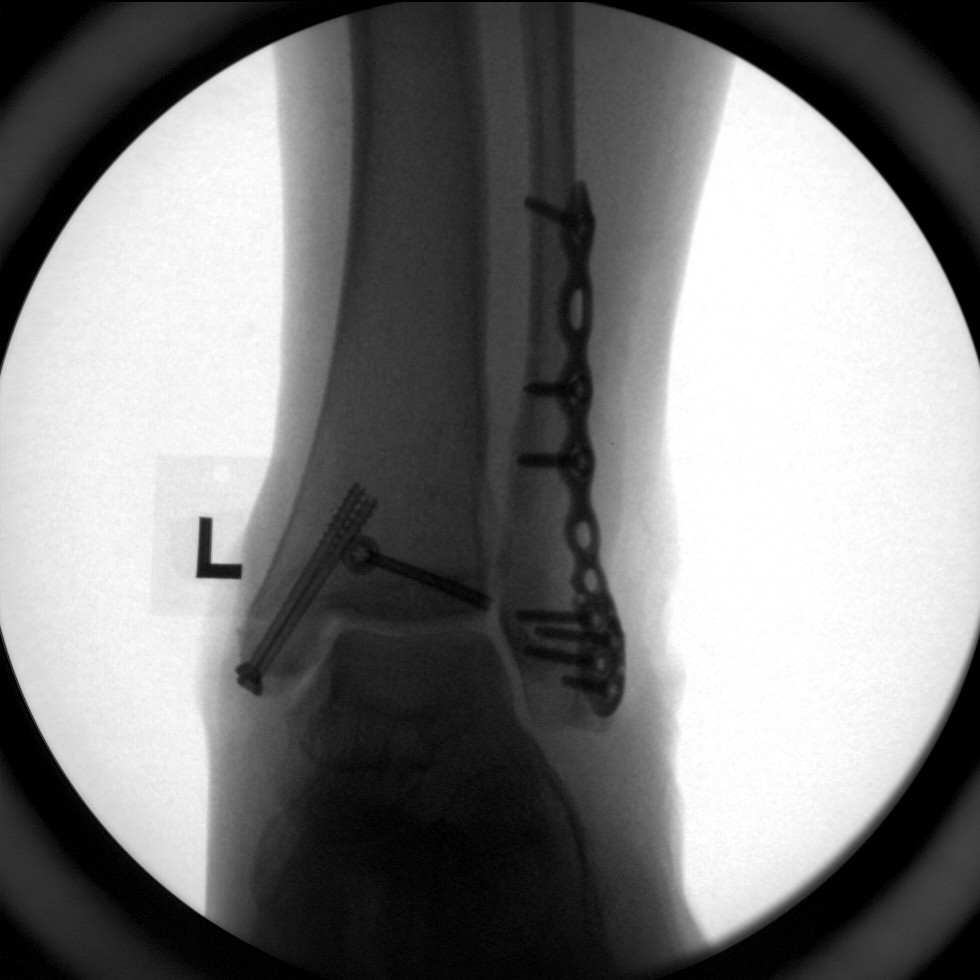

[4 of 4 positions shown; findings below may reference images not displayed]

FINDINGS: Four C-arm fluoroscopic images were obtained intraoperatively and
submitted for post operative interpretation. These images
demonstrate plate and screw fixation of the distal fibula as well as
fixation of the medial malleolus and posterior malleolus with
screws. No unexpected findings. Anatomic alignment. Please see the
performing provider's procedural report for further detail.
IMPRESSION: Intraoperative fluoroscopic imaging, as detailed above.

## 2020-11-13 IMAGING — RF DG ANKLE COMPLETE 3+V*L*
1 series · 4 of 4 positions shown · non-contrast
Comparison: None.

CLINICAL DATA: ORIF trimalleolar ankle fracture

EXAM:
DG C-ARM 1-60 MIN; LEFT ANKLE COMPLETE - 3+ VIEW
FLUOROSCOPY TIME:  Fluoroscopy Time:  31 seconds
Cumulative air kerma: 0.5 mGy.

[Series 1: run · 4 of 4 slices shown]
[im 1/4]
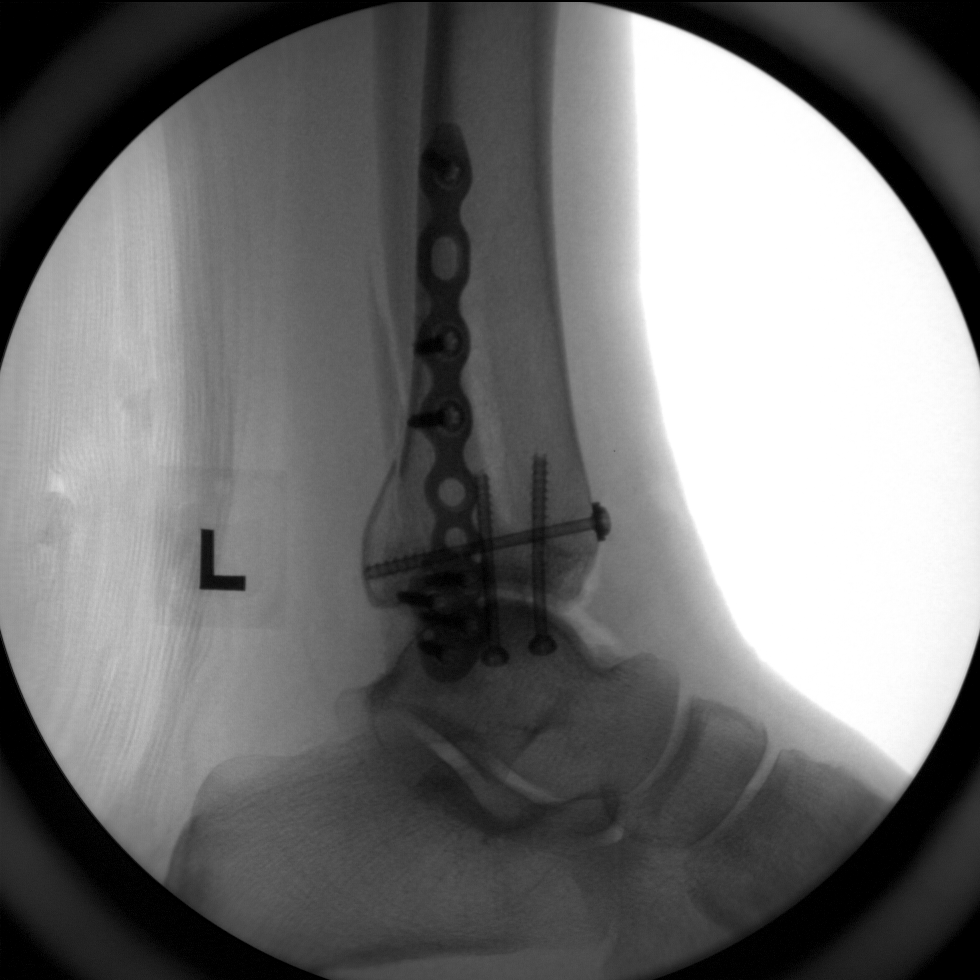
[im 2/4]
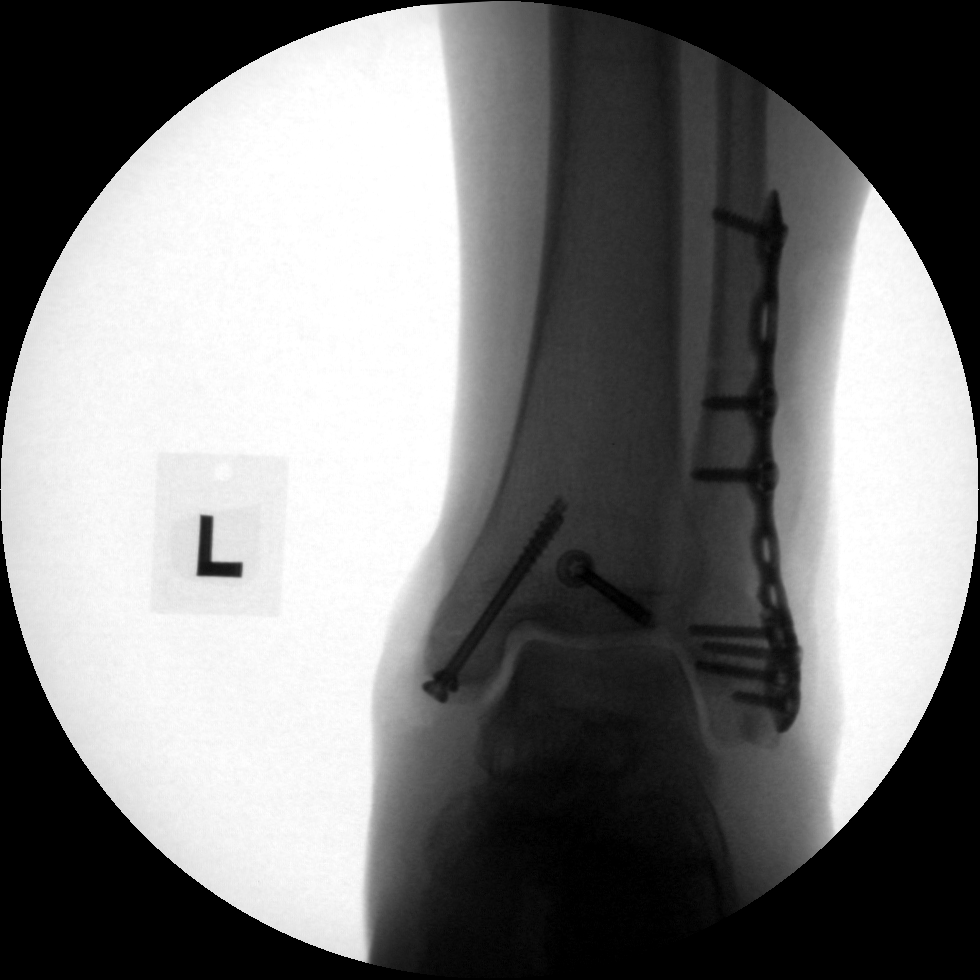
[im 3/4]
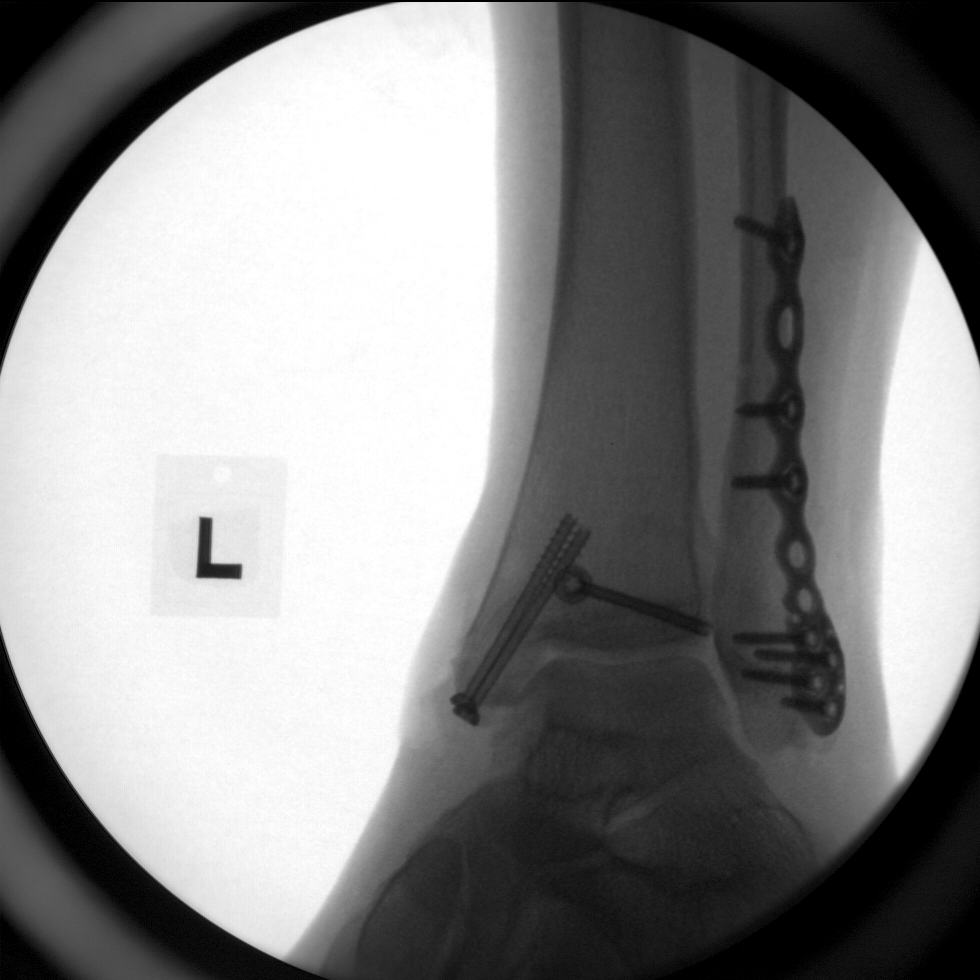
[im 4/4]
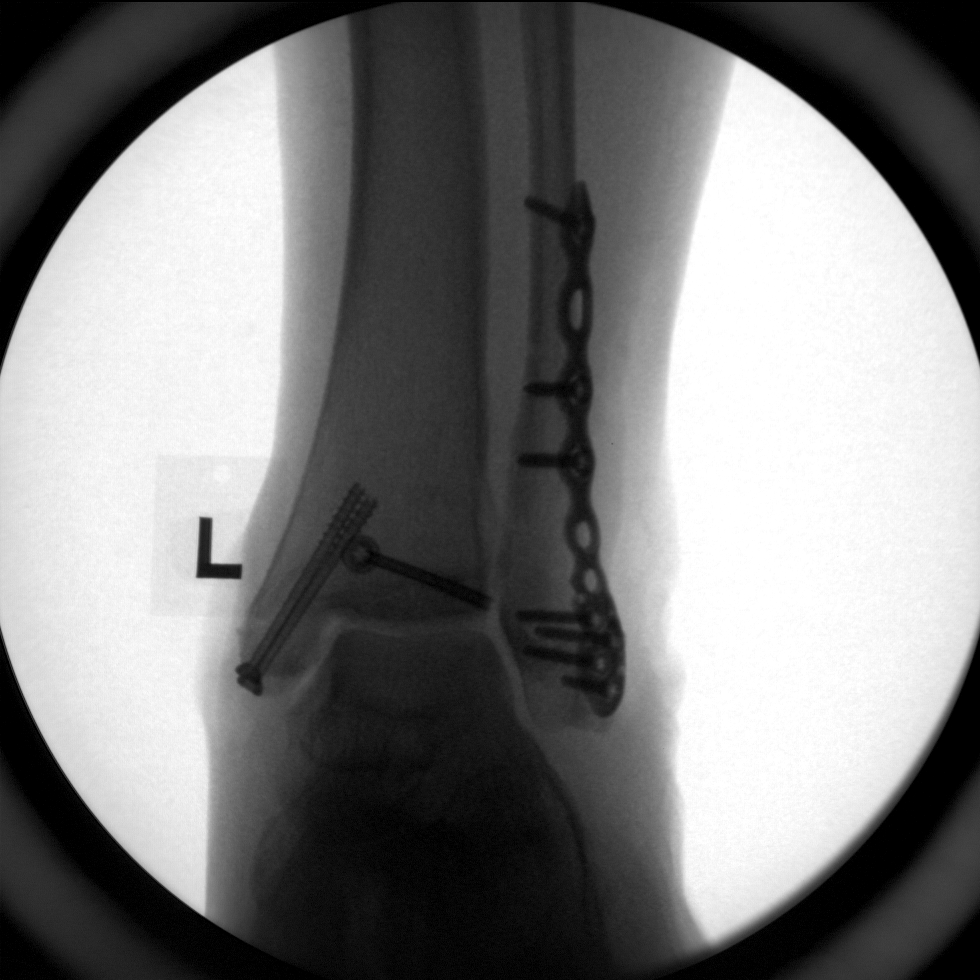

[4 of 4 positions shown; findings below may reference images not displayed]

FINDINGS: Four C-arm fluoroscopic images were obtained intraoperatively and
submitted for post operative interpretation. These images
demonstrate plate and screw fixation of the distal fibula as well as
fixation of the medial malleolus and posterior malleolus with
screws. No unexpected findings. Anatomic alignment. Please see the
performing provider's procedural report for further detail.
IMPRESSION: Intraoperative fluoroscopic imaging, as detailed above.

## 2020-11-13 SURGERY — OPEN REDUCTION INTERNAL FIXATION (ORIF) ANKLE FRACTURE
Anesthesia: General | Site: Ankle | Laterality: Left

## 2020-11-13 MED ORDER — BUPIVACAINE-EPINEPHRINE (PF) 0.5% -1:200000 IJ SOLN
INTRAMUSCULAR | Status: DC | PRN
  Administered 2020-11-13: 20 mL via PERINEURAL
  Administered 2020-11-13: 15 mL via PERINEURAL

## 2020-11-13 MED ORDER — DEXAMETHASONE SODIUM PHOSPHATE 10 MG/ML IJ SOLN
INTRAMUSCULAR | Status: AC
  Filled 2020-11-13: qty 1

## 2020-11-13 MED ORDER — OXYCODONE HCL 5 MG/5ML PO SOLN
5.0000 mg | Freq: Once | ORAL | Status: DC | PRN
Start: 2020-11-13 — End: 2020-11-13

## 2020-11-13 MED ORDER — ONDANSETRON HCL 4 MG/2ML IJ SOLN
INTRAMUSCULAR | Status: DC | PRN
  Administered 2020-11-13: 4 mg via INTRAVENOUS

## 2020-11-13 MED ORDER — DEXAMETHASONE SODIUM PHOSPHATE 10 MG/ML IJ SOLN
INTRAMUSCULAR | Status: DC | PRN
  Administered 2020-11-13: 5 mg via INTRAVENOUS

## 2020-11-13 MED ORDER — PROPOFOL 10 MG/ML IV BOLUS
INTRAVENOUS | Status: AC
  Filled 2020-11-13: qty 20

## 2020-11-13 MED ORDER — SODIUM CHLORIDE 0.9 % IV SOLN
INTRAVENOUS | Status: DC | PRN
  Administered 2020-11-13: 10 mL/h via INTRAVENOUS

## 2020-11-13 MED ORDER — OXYCODONE HCL 5 MG PO TABS: 5.0000 mg | ORAL_TABLET | ORAL | 0 refills | Status: AC | PRN

## 2020-11-13 MED ORDER — FENTANYL CITRATE (PF) 100 MCG/2ML IJ SOLN
100.0000 ug | Freq: Once | INTRAMUSCULAR | Status: AC
  Administered 2020-11-13: 50 ug via INTRAVENOUS

## 2020-11-13 MED ORDER — ASPIRIN 325 MG PO TABS: 325.0000 mg | ORAL_TABLET | Freq: Every day | ORAL | 11 refills | Status: DC

## 2020-11-13 MED ORDER — OXYCODONE HCL 5 MG PO TABS: 5.0000 mg | ORAL_TABLET | Freq: Once | ORAL | Status: DC | PRN

## 2020-11-13 MED ORDER — PHENYLEPHRINE 40 MCG/ML (10ML) SYRINGE FOR IV PUSH (FOR BLOOD PRESSURE SUPPORT)
PREFILLED_SYRINGE | INTRAVENOUS | Status: DC | PRN
  Administered 2020-11-13: 120 ug via INTRAVENOUS

## 2020-11-13 MED ORDER — FENTANYL CITRATE (PF) 100 MCG/2ML IJ SOLN: 25.0000 ug | INTRAMUSCULAR | Status: DC | PRN

## 2020-11-13 MED ORDER — PHENYLEPHRINE 40 MCG/ML (10ML) SYRINGE FOR IV PUSH (FOR BLOOD PRESSURE SUPPORT)
PREFILLED_SYRINGE | INTRAVENOUS | Status: AC
  Filled 2020-11-13: qty 10

## 2020-11-13 MED ORDER — MIDAZOLAM HCL 2 MG/2ML IJ SOLN
INTRAMUSCULAR | Status: AC
  Filled 2020-11-13: qty 2

## 2020-11-13 MED ORDER — LIDOCAINE 2% (20 MG/ML) 5 ML SYRINGE
INTRAMUSCULAR | Status: AC
  Filled 2020-11-13: qty 5

## 2020-11-13 MED ORDER — FENTANYL CITRATE (PF) 100 MCG/2ML IJ SOLN
INTRAMUSCULAR | Status: AC
  Filled 2020-11-13: qty 2

## 2020-11-13 MED ORDER — PROPOFOL 10 MG/ML IV BOLUS
INTRAVENOUS | Status: DC | PRN
  Administered 2020-11-13: 200 mg via INTRAVENOUS

## 2020-11-13 MED ORDER — CEFAZOLIN SODIUM-DEXTROSE 2-4 GM/100ML-% IV SOLN
INTRAVENOUS | Status: AC
  Filled 2020-11-13: qty 100

## 2020-11-13 MED ORDER — ONDANSETRON HCL 4 MG/2ML IJ SOLN
INTRAMUSCULAR | Status: AC
  Filled 2020-11-13: qty 2

## 2020-11-13 MED ORDER — EPHEDRINE 5 MG/ML INJ
INTRAVENOUS | Status: AC
  Filled 2020-11-13: qty 10

## 2020-11-13 MED ORDER — MIDAZOLAM HCL 2 MG/2ML IJ SOLN
2.0000 mg | Freq: Once | INTRAMUSCULAR | Status: AC
  Administered 2020-11-13: 1 mg via INTRAVENOUS

## 2020-11-13 MED ORDER — ONDANSETRON HCL 4 MG/2ML IJ SOLN: 4.0000 mg | Freq: Four times a day (QID) | INTRAMUSCULAR | Status: DC | PRN

## 2020-11-13 MED ORDER — PHENYLEPHRINE HCL (PRESSORS) 10 MG/ML IV SOLN
INTRAVENOUS | Status: AC
  Filled 2020-11-13: qty 1

## 2020-11-13 MED ORDER — LACTATED RINGERS IV SOLN: INTRAVENOUS | Status: DC

## 2020-11-13 MED ORDER — CEFAZOLIN SODIUM-DEXTROSE 2-4 GM/100ML-% IV SOLN
2.0000 g | INTRAVENOUS | Status: AC
  Administered 2020-11-13: 2 g via INTRAVENOUS

## 2020-11-13 MED ORDER — LIDOCAINE 2% (20 MG/ML) 5 ML SYRINGE
INTRAMUSCULAR | Status: DC | PRN
  Administered 2020-11-13: 60 mg via INTRAVENOUS

## 2020-11-13 SURGICAL SUPPLY — 76 items
APL PRP STRL LF DISP 70% ISPRP (MISCELLANEOUS) ×1
APL SKNCLS STERI-STRIP NONHPOA (GAUZE/BANDAGES/DRESSINGS)
BANDAGE ESMARK 6X9 LF (GAUZE/BANDAGES/DRESSINGS) ×1 IMPLANT
BENZOIN TINCTURE PRP APPL 2/3 (GAUZE/BANDAGES/DRESSINGS) IMPLANT
BIT DRILL 2 CANN GRADUATED (BIT) ×1 IMPLANT
BIT DRILL 2.5 CANN LNG (BIT) ×2 IMPLANT
BIT DRILL 2.6 CANN (BIT) ×2 IMPLANT
BLADE SURG 15 STRL LF DISP TIS (BLADE) ×2 IMPLANT
BLADE SURG 15 STRL SS (BLADE) ×4
BNDG CMPR 9X6 STRL LF SNTH (GAUZE/BANDAGES/DRESSINGS) ×1
BNDG COHESIVE 4X5 TAN STRL (GAUZE/BANDAGES/DRESSINGS) IMPLANT
BNDG ELASTIC 6X5.8 VLCR STR LF (GAUZE/BANDAGES/DRESSINGS) ×4 IMPLANT
BNDG ESMARK 6X9 LF (GAUZE/BANDAGES/DRESSINGS) ×2
CHLORAPREP W/TINT 26 (MISCELLANEOUS) ×2 IMPLANT
COVER BACK TABLE 60X90IN (DRAPES) ×2 IMPLANT
COVER WAND RF STERILE (DRAPES) IMPLANT
CUFF TOURN SGL QUICK 34 (TOURNIQUET CUFF) ×2
CUFF TRNQT CYL 34X4.125X (TOURNIQUET CUFF) ×1 IMPLANT
DECANTER SPIKE VIAL GLASS SM (MISCELLANEOUS) IMPLANT
DRAPE C-ARM 42X72 X-RAY (DRAPES) IMPLANT
DRAPE C-ARMOR (DRAPES) IMPLANT
DRAPE EXTREMITY T 121X128X90 (DISPOSABLE) ×2 IMPLANT
DRAPE IMP U-DRAPE 54X76 (DRAPES) ×2 IMPLANT
DRAPE U-SHAPE 47X51 STRL (DRAPES) ×2 IMPLANT
ELECT REM PT RETURN 9FT ADLT (ELECTROSURGICAL) ×2
ELECTRODE REM PT RTRN 9FT ADLT (ELECTROSURGICAL) ×1 IMPLANT
GAUZE SPONGE 4X4 12PLY STRL (GAUZE/BANDAGES/DRESSINGS) ×2 IMPLANT
GAUZE XEROFORM 1X8 LF (GAUZE/BANDAGES/DRESSINGS) ×2 IMPLANT
GLOVE BIOGEL PI IND STRL 7.0 (GLOVE) ×1 IMPLANT
GLOVE BIOGEL PI INDICATOR 7.0 (GLOVE) ×1
GLOVE ECLIPSE 6.5 STRL STRAW (GLOVE) ×2 IMPLANT
GLOVE SRG 8 PF TXTR STRL LF DI (GLOVE) ×1 IMPLANT
GLOVE SURG ENC TEXT LTX SZ7.5 (GLOVE) ×2 IMPLANT
GLOVE SURG UNDER POLY LF SZ8 (GLOVE) ×2
GOWN STRL REUS W/ TWL LRG LVL3 (GOWN DISPOSABLE) ×1 IMPLANT
GOWN STRL REUS W/ TWL XL LVL3 (GOWN DISPOSABLE) ×1 IMPLANT
GOWN STRL REUS W/TWL LRG LVL3 (GOWN DISPOSABLE) ×2
GOWN STRL REUS W/TWL XL LVL3 (GOWN DISPOSABLE) ×2
GUIDEWIRE 1.35MM (WIRE) ×3 IMPLANT
NS IRRIG 1000ML POUR BTL (IV SOLUTION) ×2 IMPLANT
PACK BASIN DAY SURGERY FS (CUSTOM PROCEDURE TRAY) ×2 IMPLANT
PAD CAST 4YDX4 CTTN HI CHSV (CAST SUPPLIES) ×1 IMPLANT
PADDING CAST COTTON 4X4 STRL (CAST SUPPLIES) ×2
PADDING CAST SYNTHETIC 4 (CAST SUPPLIES) ×2
PADDING CAST SYNTHETIC 4X4 STR (CAST SUPPLIES) ×2 IMPLANT
PENCIL SMOKE EVACUATOR (MISCELLANEOUS) ×2 IMPLANT
PLATE LOCK DIST FIB 5H TTNIUM (Plate) ×2 IMPLANT
SCREW LO-PRO TI 3.5X16MM (Screw) ×2 IMPLANT
SCREW LOCK 18X3XVALOPRFL (Screw) ×3 IMPLANT
SCREW LOCK TI QF 3X12 (Screw) ×2 IMPLANT
SCREW LOCKING 3.0X18 (Screw) ×6 IMPLANT
SCREW LP TI 3.5X14MM (Screw) ×4 IMPLANT
SCREW QCFIX CANN 4X46 SHT (Screw) ×2 IMPLANT
SCREW QCKFIX CANN 4.0X40MM (Screw) ×4 IMPLANT
SHEET MEDIUM DRAPE 40X70 STRL (DRAPES) ×2 IMPLANT
SLEEVE SCD COMPRESS KNEE MED (MISCELLANEOUS) ×2 IMPLANT
SPLINT FAST PLASTER 5X30 (CAST SUPPLIES) ×20
SPLINT PLASTER CAST FAST 5X30 (CAST SUPPLIES) ×20 IMPLANT
SPONGE LAP 18X18 RF (DISPOSABLE) IMPLANT
STAPLER VISISTAT 35W (STAPLE) IMPLANT
STOCKINETTE 6  STRL (DRAPES) ×2
STOCKINETTE 6 STRL (DRAPES) ×1 IMPLANT
STRIP CLOSURE SKIN 1/2X4 (GAUZE/BANDAGES/DRESSINGS) IMPLANT
SUCTION FRAZIER HANDLE 10FR (MISCELLANEOUS) ×2
SUCTION TUBE FRAZIER 10FR DISP (MISCELLANEOUS) ×1 IMPLANT
SUT ETHILON 3 0 PS 1 (SUTURE) ×2 IMPLANT
SUT MNCRL AB 3-0 PS2 18 (SUTURE) ×2 IMPLANT
SUT PDS AB 2-0 CT2 27 (SUTURE) ×2 IMPLANT
SUT VIC AB 2-0 SH 27 (SUTURE)
SUT VIC AB 2-0 SH 27XBRD (SUTURE) IMPLANT
SUT VIC AB 3-0 FS2 27 (SUTURE) IMPLANT
SYR BULB EAR ULCER 3OZ GRN STR (SYRINGE) ×2 IMPLANT
TOWEL GREEN STERILE FF (TOWEL DISPOSABLE) ×4 IMPLANT
TUBE CONNECTING 20X1/4 (TUBING) ×2 IMPLANT
UNDERPAD 30X36 HEAVY ABSORB (UNDERPADS AND DIAPERS) ×2 IMPLANT
WASHER ORTHO OD TITAN F/CA 7 (Washer) ×2 IMPLANT

## 2020-11-13 NOTE — Anesthesia Procedure Notes (Signed)
Anesthesia Regional Block: Adductor canal block   Pre-Anesthetic Checklist: ,, timeout performed, Correct Patient, Correct Site, Correct Laterality, Correct Procedure, Correct Position, site marked, Risks and benefits discussed,  Surgical consent,  Pre-op evaluation,  At surgeon's request and post-op pain management  Laterality: Left  Prep: chloraprep       Needles:  Injection technique: Single-shot  Needle Type: Echogenic Needle     Needle Length: 9cm  Needle Gauge: 21     Additional Needles:   Narrative:  Start time: 11/13/2020 11:20 AM End time: 11/13/2020 11:25 AM Injection made incrementally with aspirations every 5 mL.  Performed by: Personally  Anesthesiologist: Albertha Ghee, MD  Additional Notes: Pt tolerated the procedure well.

## 2020-11-13 NOTE — Discharge Instructions (Signed)
Post Anesthesia Home Care Instructions  Activity: Get plenty of rest for the remainder of the day. A responsible individual must stay with you for 24 hours following the procedure.  For the next 24 hours, DO NOT: -Drive a car -Paediatric nurse -Drink alcoholic beverages -Take any medication unless instructed by your physician -Make any legal decisions or sign important papers.  Meals: Start with liquid foods such as gelatin or soup. Progress to regular foods as tolerated. Avoid greasy, spicy, heavy foods. If nausea and/or vomiting occur, drink only clear liquids until the nausea and/or vomiting subsides. Call your physician if vomiting continues.  Special Instructions/Symptoms: Your throat may feel dry or sore from the anesthesia or the breathing tube placed in your throat during surgery. If this causes discomfort, gargle with warm salt water. The discomfort should disappear within 24 hours.  If you had a scopolamine patch placed behind your ear for the management of post- operative nausea and/or vomiting:  1. The medication in the patch is effective for 72 hours, after which it should be removed.  Wrap patch in a tissue and discard in the trash. Wash hands thoroughly with soap and water. 2. You may remove the patch earlier than 72 hours if you experience unpleasant side effects which may include dry mouth, dizziness or visual disturbances. 3. Avoid touching the patch. Wash your hands with soap and water after contact with the patch.       Regional Anesthesia Blocks  1. Numbness or the inability to move the "blocked" extremity may last from 3-48 hours after placement. The length of time depends on the medication injected and your individual response to the medication. If the numbness is not going away after 48 hours, call your surgeon.  2. The extremity that is blocked will need to be protected until the numbness is gone and the  Strength has returned. Because you cannot feel it, you  will need to take extra care to avoid injury. Because it may be weak, you may have difficulty moving it or using it. You may not know what position it is in without looking at it while the block is in effect.  3. For blocks in the legs and feet, returning to weight bearing and walking needs to be done carefully. You will need to wait until the numbness is entirely gone and the strength has returned. You should be able to move your leg and foot normally before you try and bear weight or walk. You will need someone to be with you when you first try to ensure you do not fall and possibly risk injury.  4. Bruising and tenderness at the needle site are common side effects and will resolve in a few days.  5. Persistent numbness or new problems with movement should be communicated to the surgeon or the [facility] [phone] [place] [phone]).    DR. Lucia G[facility]ins [facility] POST-OP INSTRUCTIONS   Pain Management 1. The numbing medicine and your leg will last around 18 hours, take a dose of your pain medicine as soon as you feel it wearing off to avoid rebound pain. 2. Keep your foot elevated above heart level.  Make sure that your heel hangs free ('floats'). 3. Take all prescribed medication as directed. 4. If taking narcotic pain medication you may want to use an over-the-counter stool softener to avoid constipation. 5. You may take over-the-counter NSAIDs (ibuprofen, naproxen, etc.) as well as over-the-counter acetaminophen as directed on the  packaging as a supplement for your pain and may also use it to wean away from the prescription medication.  Activity ? Non-weightbearing ? Keep splint intact  First Postoperative Visit 1. Your first postop visit will be at least 2 weeks after surgery.  This should be scheduled when you schedule surgery. 2. If you do not have a postoperative visit scheduled please call [phone] to schedule an appointment. 3. At  the appointment your incision will be evaluated for suture removal, x-rays will be obtained if necessary.  General Instructions 1. Swelling is very common after foot and ankle surgery.  It often takes 3 months for the foot and ankle to begin to feel comfortable.  Some amount of swelling will persist for 6-12 months. 2. DO NOT change the dressing.  If there is a problem with the dressing (too tight, loose, gets wet, etc.) please contact Dr. Pollie Friar office. 3. DO NOT get the dressing wet.  For showers you can use an over-the-counter cast cover or wrap a washcloth around the top of your dressing and then cover it with a plastic bag and tape it to your leg. 4. DO NOT soak the incision (no tubs, pools, bath, etc.) until you have approval from Dr. Lucia Gaskins.  Contact Dr. Huel Cote office or go to Emergency Room if: 1. Temperature above 101 F. 2. Increasing pain that is unresponsive to pain medication or elevation 3. Excessive redness or swelling in your foot 4. Dressing problems - excessive bloody drainage, looseness or tightness, or if dressing gets wet 5. Develop pain, swelling, warmth, or discoloration of your calf

## 2020-11-13 NOTE — H&P (Signed)
PREOPERATIVE H&P  Chief Complaint: Left ankle pain  HPI: Rita Davis is a 60 y.o. female who presents for preoperative history and physical with a diagnosis of left trimalleolar ankle fracture.  She sustained this approximately 1 week ago.  She was seen in my office.  It was a work-related injury.  Given the displacement and instability pattern of her fracture she is here today for fixation. Symptoms are rated as moderate to severe, and have been worsening.  This is significantly impairing activities of daily living.  She has elected for surgical management.   Past Medical History:  Diagnosis Date  . Anxiety   . Diabetes mellitus without complication ([place])   . Hyperlipidemia   . Hypertension    Past Surgical History:  Procedure Laterality Date  . ABDOMINAL HYSTERECTOMY     still has ovaries  . BREAST BIOPSY Right 05/23/2012  . BREAST CYST EXCISION Right   . cyst removed     from lower back  . DILATION AND CURETTAGE OF UTERUS     Social History   Socioeconomic History  . Marital status: Married    Spouse name: Not on file  . Number of children: Not on file  . Years of education: Not on file  . Highest education level: Not on file  Occupational History  . Not on file  Tobacco Use  . Smoking status: Never Smoker  . Smokeless tobacco: Never Used  Substance and Sexual Activity  . Alcohol use: No  . Drug use: No  . Sexual activity: Not on file  Other Topics Concern  . Not on file  Social History Narrative  . Not on file   Social Determinants of Health   Financial Resource Strain: Not on file  Food Insecurity: Not on file  Transportation Needs: Not on file  Physical Activity: Not on file  Stress: Not on file  Social Connections: Not on file   Family History  Problem Relation Age of Onset  . Hypertension Mother   . Prostate cancer Father    No Known Allergies Prior to Admission medications   Medication Sig Start Date End Date Taking? Authorizing Provider   amLODipine-olmesartan (AZOR) 5-40 MG tablet Take 1 tablet by mouth daily.   Yes [provider]  escitalopram (LEXAPRO) 10 MG tablet Take 10 mg by mouth as needed.    Yes [provider]  METFORMIN HCL PO Take by mouth.   Yes [provider]  Multiple Vitamin (MULTI-VITAMINS) TABS Take by mouth.   Yes [provider]  simvastatin (ZOCOR) 10 MG tablet Take 10 mg by mouth at bedtime. Pt stated, "I am not taking my medication, I am trying to manage my cholesterol myself" -- 03/21/18   Yes [provider]  vitamin E 1000 UNIT capsule Take 1,000 Units by mouth daily.   Yes [provider]  betamethasone valerate (VALISONE) 0.1 % cream  07/31/18   [provider]  ibuprofen (ADVIL,MOTRIN) 800 MG tablet Take 1 tablet (800 mg total) by mouth every 8 (eight) hours as needed. 06/20/18   Landis Martins, DPM  methocarbamol (ROBAXIN) 500 MG tablet  03/10/18   [provider]  naproxen (NAPROSYN) 500 MG tablet  03/10/18   [provider]  promethazine (PHENERGAN) 25 MG tablet  06/12/18   [provider]     Positive ROS: All other systems have been reviewed and were otherwise negative with the exception of those mentioned in the HPI and as above.  Physical Exam:  Vitals:   11/13/20 1014  BP: 125/74  Pulse: 80  Resp: 11  Temp: 98 F (36.7 C)  SpO2: 100%   General: Alert, no acute distress Cardiovascular: No pedal edema Respiratory: No cyanosis, no use of accessory musculature GI: No organomegaly, abdomen is soft and non-tender Skin: No lesions in the area of chief complaint Neurologic: Sensation intact distally Psychiatric: Patient is competent for consent with normal mood and affect Lymphatic: No axillary or cervical lymphadenopathy  MUSCULOSKELETAL: Left ankle in a short leg splint.  Foot is warm and well-perfused.  She has tenderness palpation laterally and medially about the ankle through the splint.  No  tenderness proximally to the splint.  Clinically alignment is maintained and appropriate.  Sensation grossly intact about the toes.  She is able to wiggle her toes.  Assessment: Left trimalleolar ankle fracture with possible syndesmotic disruption   Plan: Plan for operative treatment of her trimalleolar ankle fracture with possible posterior fixation and syndesmotic fixation.  We discussed the risks, benefits and alternatives of surgery which include but are not limited to wound healing complications, infection, nonunion, malunion, need for further surgery, damage to surrounding structures and continued pain.  They understand there is no guarantees to an acceptable outcome.  After weighing these risks they opted to proceed with surgery.     Erle Crocker, MD    11/13/2020 11:04 AM

## 2020-11-13 NOTE — Progress Notes (Signed)
Assisted Dr. Marcie Bal with left, ultrasound guided, popliteal, adductor canal block. Side rails up, monitors on throughout procedure. See vital signs in flow sheet. Tolerated Procedure well.

## 2020-11-13 NOTE — Transfer of Care (Signed)
Immediate Anesthesia Transfer of Care Note  Patient: Rita Davis  Procedure(s) Performed: OPEN TREATMENT OF LEFT TRIMALLEOLAR ANKLE FRACTURE WITH POSTERIOR FIXATION, SYNDESMOSIS (Left Ankle)  Patient Location: PACU  Anesthesia Type:GA combined with regional for post-op pain  Level of Consciousness: sedated  Airway & Oxygen Therapy: Patient Spontanous Breathing and Patient connected to face mask oxygen  Post-op Assessment: Report given to RN and Post -op Vital signs reviewed and stable  Post vital signs: Reviewed and stable  Last Vitals:  Vitals Value Taken Time  BP    Temp    Pulse 80 11/13/20 1312  Resp 15 11/13/20 1312  SpO2 100 % 11/13/20 1312  Vitals shown include unvalidated device data.  Last Pain:  Vitals:   11/13/20 1014  TempSrc: Oral      Patients Stated Pain Goal: 3 (56/25/63 8937)  Complications: No complications documented.

## 2020-11-13 NOTE — Anesthesia Procedure Notes (Signed)
Anesthesia Regional Block: Popliteal block   Pre-Anesthetic Checklist: ,, timeout performed, Correct Patient, Correct Site, Correct Laterality, Correct Procedure, Correct Position, site marked, Risks and benefits discussed,  Surgical consent,  Pre-op evaluation,  At surgeon's request and post-op pain management  Laterality: Left  Prep: chloraprep       Needles:  Injection technique: Single-shot  Needle Type: Echogenic Stimulator Needle          Additional Needles:   Procedures:, nerve stimulator,,,,,,,   Nerve Stimulator or Paresthesia:  Response: plantar flexion of foot, 0.45 mA,   Additional Responses:   Narrative:  Start time: 11/13/2020 11:14 AM End time: 11/13/2020 11:20 AM Injection made incrementally with aspirations every 5 mL.  Performed by: Personally  Anesthesiologist: Albertha Ghee, MD  Additional Notes: Functioning IV was confirmed and monitors were applied.  A 52mm 21ga Arrow echogenic stimulator needle was used. Sterile prep and drape,hand hygiene and sterile gloves were used.  Negative aspiration and negative test dose prior to incremental administration of local anesthetic. The patient tolerated the procedure well.  Ultrasound guidance: relevent anatomy identified, needle position confirmed, local anesthetic spread visualized around nerve(s), vascular puncture avoided.  Image printed for medical record.

## 2020-11-13 NOTE — Anesthesia Procedure Notes (Signed)
Procedure Name: LMA Insertion Performed by: Ismar Yabut, Booker, CRNA Pre-anesthesia Checklist: Patient identified, Emergency Drugs available, Suction available and Patient being monitored Patient Re-evaluated:Patient Re-evaluated prior to induction Oxygen Delivery Method: Circle system utilized Preoxygenation: Pre-oxygenation with 100% oxygen Induction Type: IV induction Ventilation: Mask ventilation without difficulty LMA: LMA inserted LMA Size: 4.0 Number of attempts: 1 Airway Equipment and Method: Bite block Placement Confirmation: positive ETCO2 Tube secured with: Tape Dental Injury: Teeth and Oropharynx as per pre-operative assessment        

## 2020-11-13 NOTE — Anesthesia Preprocedure Evaluation (Signed)
Anesthesia Evaluation  Patient identified by MRN, date of birth, ID band Patient awake    Reviewed: Allergy & Precautions, H&P , NPO status , Patient's Chart, lab work & pertinent test results  Airway Mallampati: II   Neck ROM: full    Dental   Pulmonary neg pulmonary ROS,    breath sounds clear to auscultation       Cardiovascular hypertension,  Rhythm:regular Rate:Normal     Neuro/Psych PSYCHIATRIC DISORDERS Anxiety    GI/Hepatic   Endo/Other  diabetes, Type 2  Renal/GU      Musculoskeletal Left ankle fx   Abdominal   Peds  Hematology   Anesthesia Other Findings   Reproductive/Obstetrics                             Anesthesia Physical Anesthesia Plan  ASA: II  Anesthesia Plan: General   Post-op Pain Management:  Regional for Post-op pain   Induction: Intravenous  PONV Risk Score and Plan: 3 and Ondansetron, Dexamethasone, Midazolam and Treatment may vary due to age or medical condition  Airway Management Planned: LMA  Additional Equipment:   Intra-op Plan:   Post-operative Plan: Extubation in OR  Informed Consent: I have reviewed the patients History and Physical, chart, labs and discussed the procedure including the risks, benefits and alternatives for the proposed anesthesia with the patient or authorized representative who has indicated his/her understanding and acceptance.     Dental advisory given  Plan Discussed with: CRNA, Anesthesiologist and Surgeon  Anesthesia Plan Comments:         Anesthesia Quick Evaluation

## 2020-11-14 ENCOUNTER — Encounter (HOSPITAL_BASED_OUTPATIENT_CLINIC_OR_DEPARTMENT_OTHER): Payer: Self-pay | Admitting: Orthopaedic Surgery

## 2020-11-14 NOTE — Anesthesia Postprocedure Evaluation (Signed)
Anesthesia Post Note  Patient: Rita Davis  Procedure(s) Performed: OPEN TREATMENT OF LEFT TRIMALLEOLAR ANKLE FRACTURE WITH POSTERIOR FIXATION, SYNDESMOSIS (Left Ankle)     Patient location during evaluation: PACU Anesthesia Type: General Level of consciousness: awake and alert Pain management: pain level controlled Vital Signs Assessment: post-procedure vital signs reviewed and stable Respiratory status: spontaneous breathing, nonlabored ventilation, respiratory function stable and patient connected to nasal cannula oxygen Cardiovascular status: blood pressure returned to baseline and stable Postop Assessment: no apparent nausea or vomiting Anesthetic complications: no   No complications documented.  Last Vitals:  Vitals:   11/13/20 1330 11/13/20 1416  BP: 120/71 133/78  Pulse: 87 89  Resp: 18 16  Temp:  36.9 C  SpO2: 100% 98%    Last Pain:  Vitals:   11/14/20 0940  TempSrc:   PainSc: 0-No pain                 Kaipo Ardis S

## 2020-11-24 NOTE — Op Note (Signed)
Rita Davis female 60 y.o. 11/14/2020  PreOperative Diagnosis: Left trimalleolar ankle fracture   PostOperative Diagnosis: Same  PROCEDURE: Open treatment of left trimalleolar ankle fracture with posterior fixation Ankle stress view fluoroscopy  SURGEON: Melony Overly, MD  ASSISTANT: None  ANESTHESIA: General with peripheral nerve blockade  FINDINGS: Displaced left trimalleolar ankle fracture with large posterior fragment  IMPLANTS: Arthrex distal fibular locking plate, 4.0 mm cannulated screws  INDICATIONS:59 y.o. female Sustained the above injury in a fall.  She had displacement and underwent closed reduction in the emergency department with splinting.  She was seen in my office where CT scan was ordered to evaluate the size of the posterior malleolar fragment.  Given the size of the posterior malleolar fragment as well as displacement of her fracture she was indicated for open treatment of her trimalleolar fracture.   Patient understood the risks, benefits and alternatives to surgery which include but are not limited to wound healing complications, infection, nonunion, malunion, need for further surgery as well as damage to surrounding structures. They also understood the potential for continued pain in that there were no guarantees of acceptable outcome After weighing these risks the patient opted to proceed with surgery.  PROCEDURE: Patient was identified in the preoperative holding area.  The Left ankle was marked by myself.  Consent was signed by myself and the patient.  Block was performed by anesthesia in the preoperative holding area.  Patient was taken to the operative suite and placed supine on the operative table.  General LMA anesthesia was induced without difficulty. Bump was placed under the operative hip and bone foam was used.  All bony prominences were well padded.  Tourniquet was placed on the operative thigh.  Preoperative antibiotics were given. The  extremity was prepped and draped in the usual sterile fashion and surgical timeout was performed.  The limb was elevated and the tourniquet was inflated to 250 mmHg.  We began by making a longitudinal incision overlying the distal fibula.  This was taken sharply down through skin and subcutaneous tissue.  Blunt dissection was used to identify any branch of the superficial peroneal nerve Was not identified in the surgical field.  The incision was then taken sharply down to bone and the fracture site was identified.  The fracture site was mobilized.   The fracture site were cleaned with a rondure and curette of any fracture hematoma and callus formation.  Then the fracture of the fibula was reduced under direct visualization and held provisionally with a lobster claw.  Then fluoroscopy confirmed adequate reduction of the ankle mortise at that time.  Then a combination of locking and nonlocking screws were used after placement of a lag screw across the fracture by technique.  This provided good stability of the distal fibula fracture.  We then turned our attention to the medial malleolus.  There was continued displacement of the medial malleolus and therefore an incision was made overlying this.  This was taken sharply down through skin and subcutaneous tissue.  Bovie cautery was used for skin bleeders.  Then sharp dissection down to the fracture and the fracture site was identified.  The soft tissue flap was carried anteriorly to identify the medial gutter of the ankle joint.  Then the fracture site was mobilized and using a curette and rondure the fracture was cleared out of hematoma and fracture callus.  Then the fracture was reduced and held provisionally with a pointed reduction forcep.  This was done under direct visualization.  Then fluoroscopy confirmed adequate reduction.  2 4.0 mm partially-threaded cannulated screws were placed across the fracture site with good fixation.    Then based on the size of the  posterior malleolar fragment we felt like internal fixation was warranted.  The size of the fragment on CT scan was greater than 25% of the joint space.  It was relatively well reduced and once the ankle was maximally dorsiflexed the fragment seemed to reduce better.  A single incision was then made in the antero-medial aspect of the ankle joint.  This taken sharply down to skin and subcutaneous tissue.  Blunt dissection was used to mobilize soft tissues to gain access to the anterior distal tibia.  Retractors were placed.  A K wire was placed from anterior to posterior across the posterior malleolar fragment.  Then fluoroscopy confirmed appropriate position of the posterior malleolar fragment.  Then a single partially threaded 4.0 cannulated screw was placed across the fracture site for fixation.  Then the wound was irrigated copiously with normal saline.  Then under fluoroscopy the ankle was stressed and found to be stable with regard to medial clear space widening or syndesmotic widening.  The wounds were irrigated and the deep tissue was closed with a 3-0 monocryl.  The subcuticular tissue was closed with 3-0 Monocryl and the skin with staples.  Xeroform placed on the wounds as well as 4 x 4's and sterile she cotton.  She was placed in a nonweightbearing short leg splint.  She tolerated this well.  There were no complications.  She was awakened from anesthesia and taken recovery in stable condition.  POST OPERATIVE INSTRUCTIONS: Nonweightbearing on operative extremity Keep splint dry and limb elevated Continue 325 mg aspirin for DVT prophylaxis Call the office with concerns She will follow-up in 2 weeks for splint removal, x-rays of the operative ankle, nonweightbearing and suture removal if appropriate. She will be placed into a walking boot but maintain nonweightbearing until 6-8 weeks after surgery.   TOURNIQUET TIME:Less than 2 hours  BLOOD LOSS:  Minimal         DRAINS: none          SPECIMEN: none       COMPLICATIONS:  * No complications entered in OR log *         Disposition: PACU - hemodynamically stable.         Condition: stable

## 2021-05-18 ENCOUNTER — Other Ambulatory Visit: Payer: Self-pay | Admitting: Family Medicine

## 2021-05-18 DIAGNOSIS — Z1231 Encounter for screening mammogram for malignant neoplasm of breast: Secondary | ICD-10-CM

## 2021-05-22 ENCOUNTER — Other Ambulatory Visit: Payer: Self-pay | Admitting: Internal Medicine

## 2021-05-22 DIAGNOSIS — N63 Unspecified lump in unspecified breast: Secondary | ICD-10-CM

## 2021-06-01 HISTORY — PX: BREAST BIOPSY: SHX20

## 2021-06-03 ENCOUNTER — Ambulatory Visit
Admission: RE | Admit: 2021-06-03 | Discharge: 2021-06-03 | Disposition: A | Discharge status: Home / Self Care | Payer: BC Managed Care – PPO | Source: Ambulatory Visit | Attending: Internal Medicine | Admitting: Internal Medicine

## 2021-06-03 ENCOUNTER — Other Ambulatory Visit: Payer: Self-pay

## 2021-06-03 ENCOUNTER — Other Ambulatory Visit: Payer: Self-pay | Admitting: Family Medicine

## 2021-06-03 ENCOUNTER — Other Ambulatory Visit: Payer: Self-pay | Admitting: Internal Medicine

## 2021-06-03 ENCOUNTER — Ambulatory Visit
Admission: RE | Admit: 2021-06-03 | Discharge: 2021-06-03 | Disposition: A | Discharge status: Home / Self Care | Payer: BC Managed Care – PPO | Source: Ambulatory Visit | Attending: Family Medicine | Admitting: Family Medicine

## 2021-06-03 DIAGNOSIS — N6310 Unspecified lump in the right breast, unspecified quadrant: Secondary | ICD-10-CM

## 2021-06-03 DIAGNOSIS — N63 Unspecified lump in unspecified breast: Secondary | ICD-10-CM

## 2021-06-03 IMAGING — MG DIGITAL DIAGNOSTIC BILAT W/ TOMO W/ CAD
7 of 12 series · 7 of 36 positions shown · non-contrast
Comparison: Previous exam(s).
COMPARISON: Previous exam(s).

Addendum:
CLINICAL DATA: 60-year-old female with a palpable right breast lump
for 2 weeks.

EXAM:
DIGITAL DIAGNOSTIC BILATERAL MAMMOGRAM WITH TOMOSYNTHESIS AND CAD;
ULTRASOUND RIGHT BREAST LIMITED
TECHNIQUE: Bilateral digital diagnostic mammography and breast tomosynthesis
was performed. The images were evaluated with computer-aided
detection.; Targeted ultrasound examination of the right breast was
performed

[R ML synth-2D]
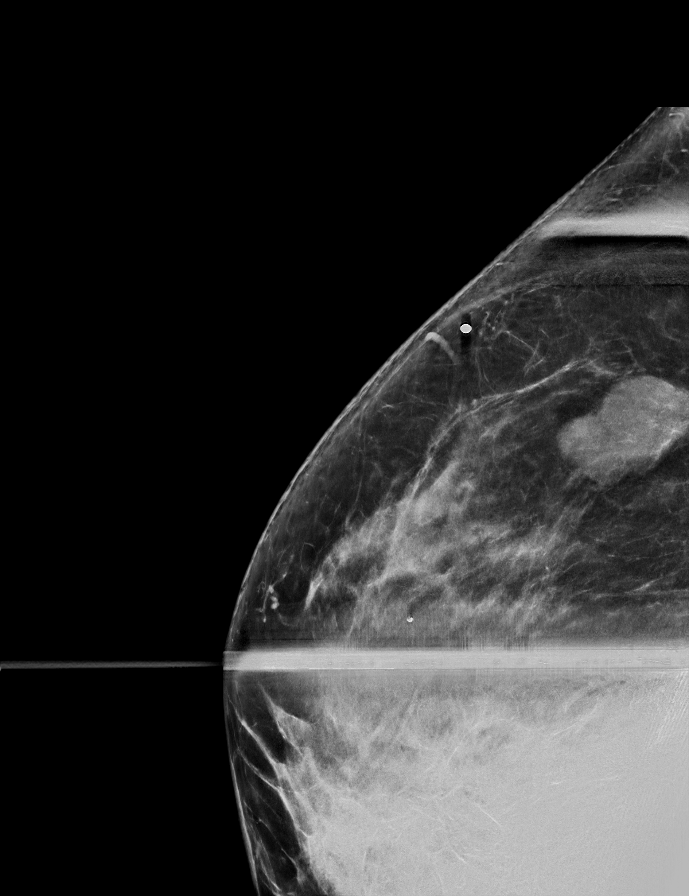

[R MLO synth-2D]
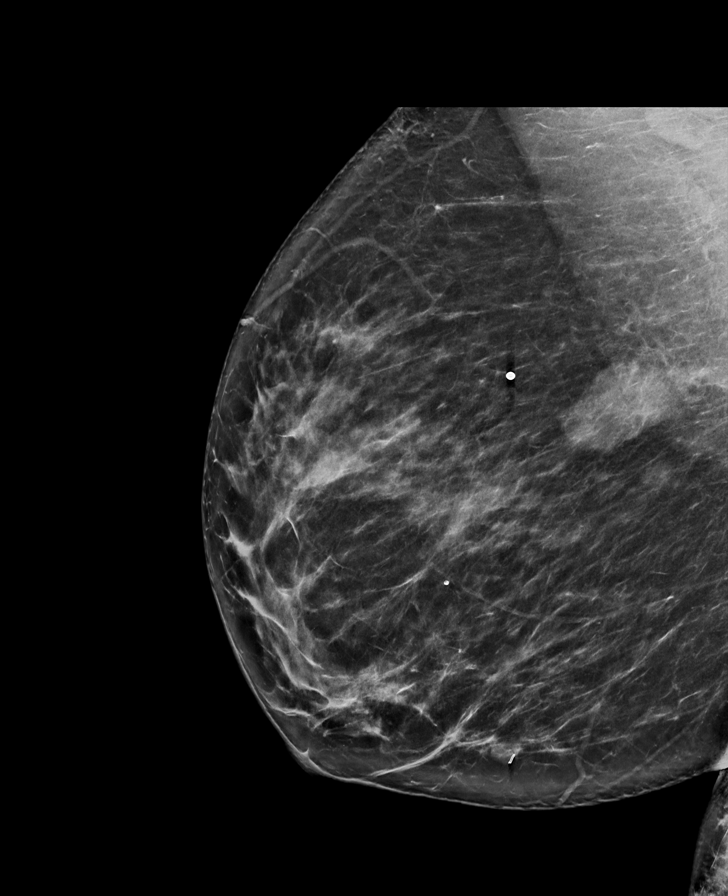

[R CC synth-2D (1 of 2)]
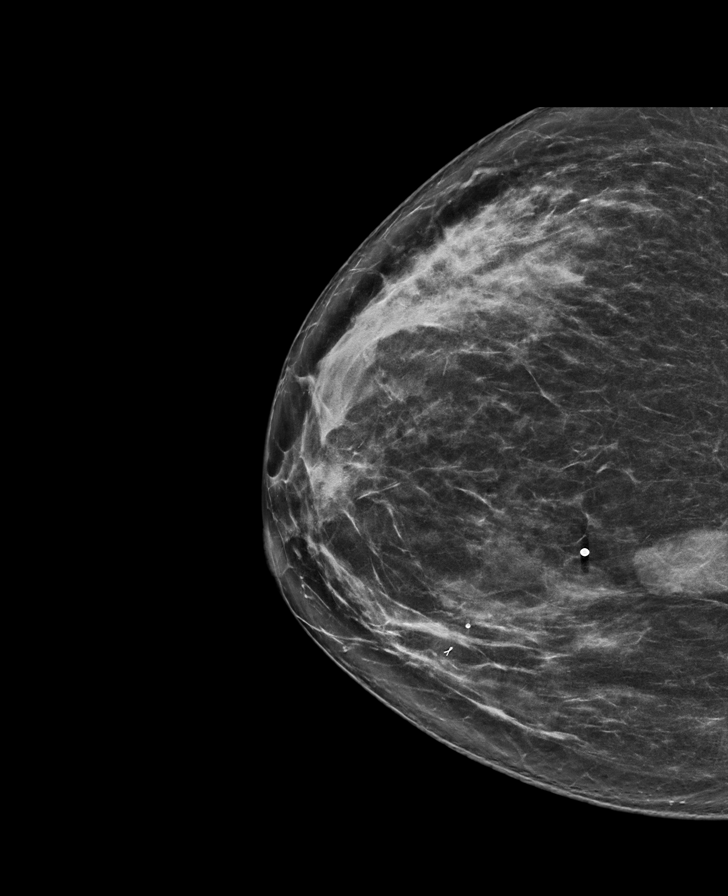

[L CC synth-2D]
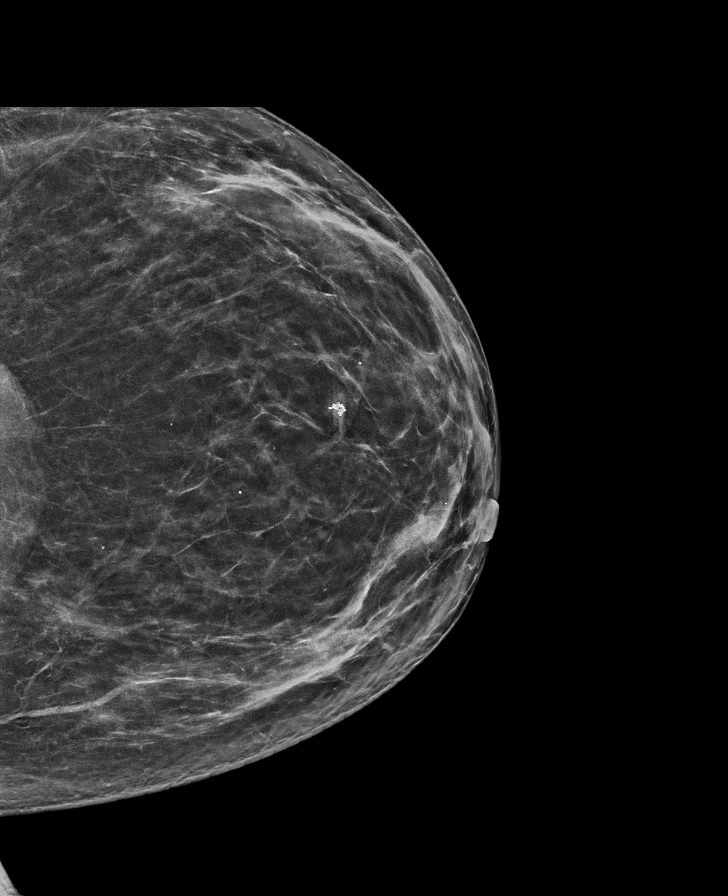

[L MLO synth-2D]
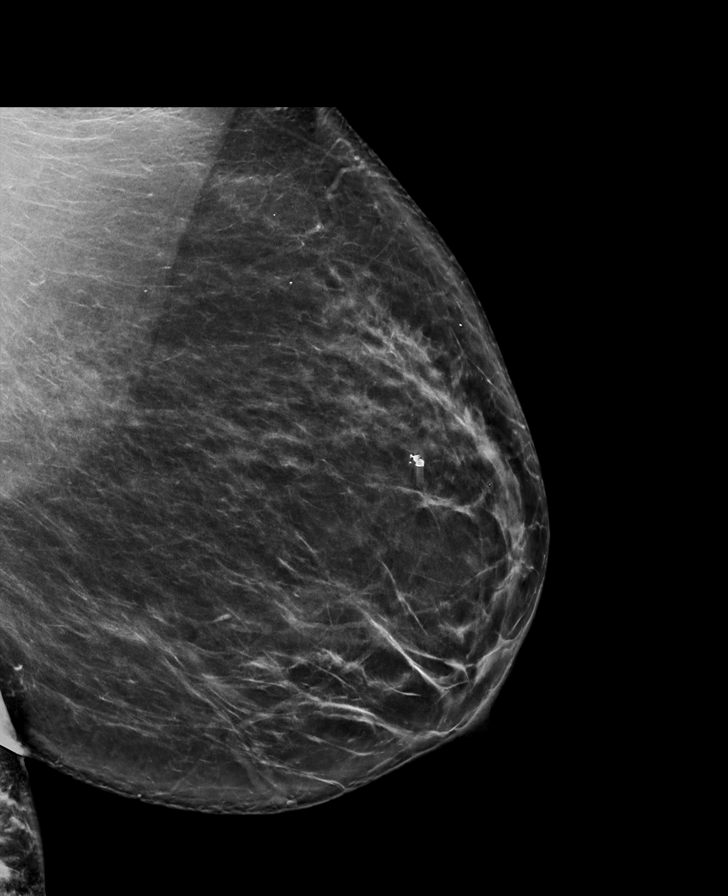

[R CC synth-2D (2 of 2)]
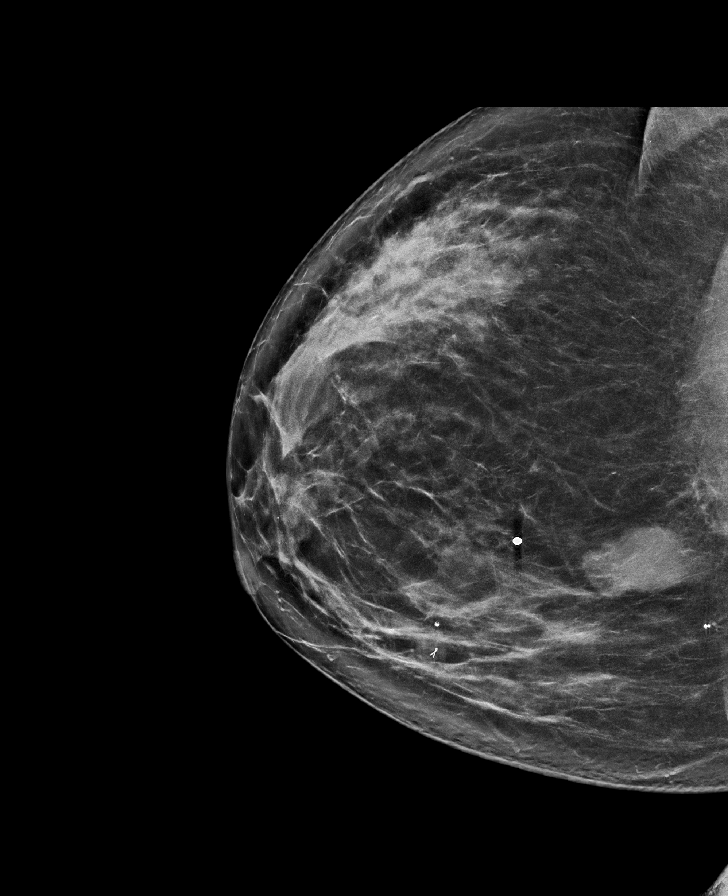

[L MLO tomo · tomo slice 47/92.0]
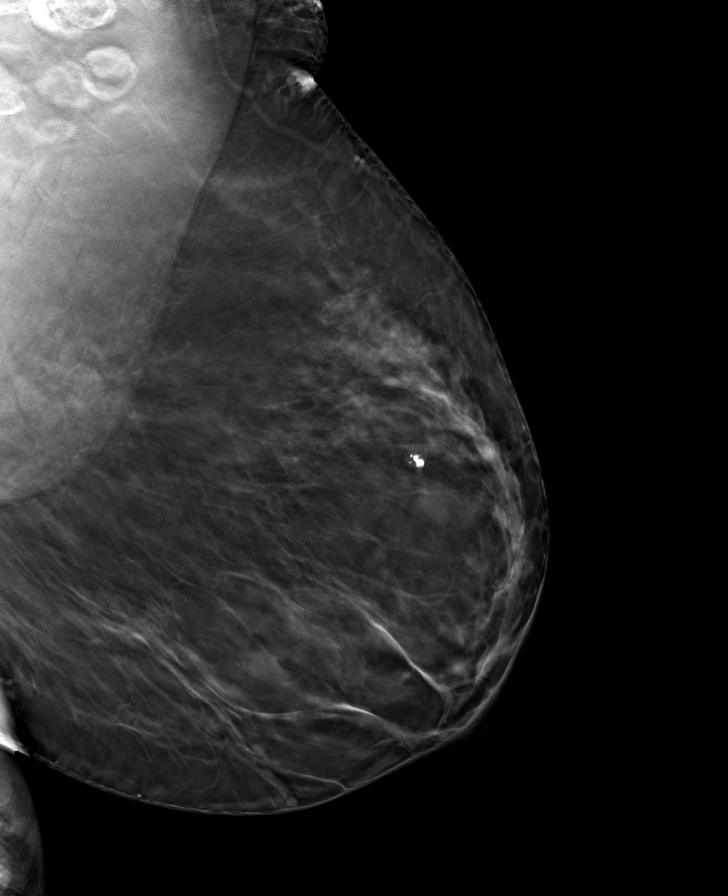

[7 of 36 positions shown; findings below may reference images not displayed]

ACR Breast Density Category c: The breast tissue is heterogeneously
dense, which may obscure small masses.
FINDINGS: A radiopaque BB was placed at the site of the patient's palpable
lump in the upper inner quadrant of the right breast. An irregular
mass with spiculated borders is demonstrated just deep to the
radiopaque BB. Otherwise, no new or suspicious findings in the
remainder of the right breast.

Additional mammographic evaluation of the left breast demonstrates
no focal or suspicious abnormality. The parenchymal pattern is
stable.

On physical exam, I palpate a firm, fixed mass in the upper inner
quadrant of the left breast.

Targeted ultrasound is performed, showing an irregular hypoechoic
mass with associated vascularity at the 1 o'clock position 10 cm
from the nipple. It measures 2.9 x 1.6 x 1.7 cm. This correlates
with the mammographic finding. Evaluation of the right axilla
demonstrates no suspicious lymphadenopathy.
IMPRESSION: 1. Suspicious right breast mass corresponding with the patient's
palpable lump. Recommendation is for ultrasound-guided biopsy.
2. No suspicious right axillary lymphadenopathy.
3. No mammographic evidence of malignancy on the left.

RECOMMENDATION:
Ultrasound-guided biopsy of the right breast.

I have discussed the findings and recommendations with the patient.
If applicable, a reminder letter will be sent to the patient
regarding the next appointment.

BI-RADS CATEGORY  5: Highly suggestive of malignancy.

ADDENDUM:
This is an addendum to the report dictated on [DATE].

The all caps section should read:

On physical exam I palpate a firm, fixed mass in the upper inner
quadrant of the RIGHT breast. The positive findings on this study
were in the right breast.

*** End of Addendum ***
ACR Breast Density Category c: The breast tissue is heterogeneously
dense, which may obscure small masses.
FINDINGS: A radiopaque BB was placed at the site of the patient's palpable
lump in the upper inner quadrant of the right breast. An irregular
mass with spiculated borders is demonstrated just deep to the
radiopaque BB. Otherwise, no new or suspicious findings in the
remainder of the right breast.

Additional mammographic evaluation of the left breast demonstrates
no focal or suspicious abnormality. The parenchymal pattern is
stable.

On physical exam, I palpate a firm, fixed mass in the upper inner
quadrant of the left breast.

Targeted ultrasound is performed, showing an irregular hypoechoic
mass with associated vascularity at the 1 o'clock position 10 cm
from the nipple. It measures 2.9 x 1.6 x 1.7 cm. This correlates
with the mammographic finding. Evaluation of the right axilla
demonstrates no suspicious lymphadenopathy.
IMPRESSION: 1. Suspicious right breast mass corresponding with the patient's
palpable lump. Recommendation is for ultrasound-guided biopsy.
2. No suspicious right axillary lymphadenopathy.
3. No mammographic evidence of malignancy on the left.

RECOMMENDATION:
Ultrasound-guided biopsy of the right breast.

I have discussed the findings and recommendations with the patient.
If applicable, a reminder letter will be sent to the patient
regarding the next appointment.

BI-RADS CATEGORY  5: Highly suggestive of malignancy.

## 2021-06-03 IMAGING — MG MM BREAST LOCALIZATION CLIP
4 series · 4 of 12 positions shown · non-contrast
Comparison: Previous exam(s).

CLINICAL DATA: Evaluate COIL clip placement following
ultrasound-guided RIGHT breast biopsy.

EXAM:
3D DIAGNOSTIC RIGHT MAMMOGRAM POST ULTRASOUND BIOPSY

[R CC synth-2D]
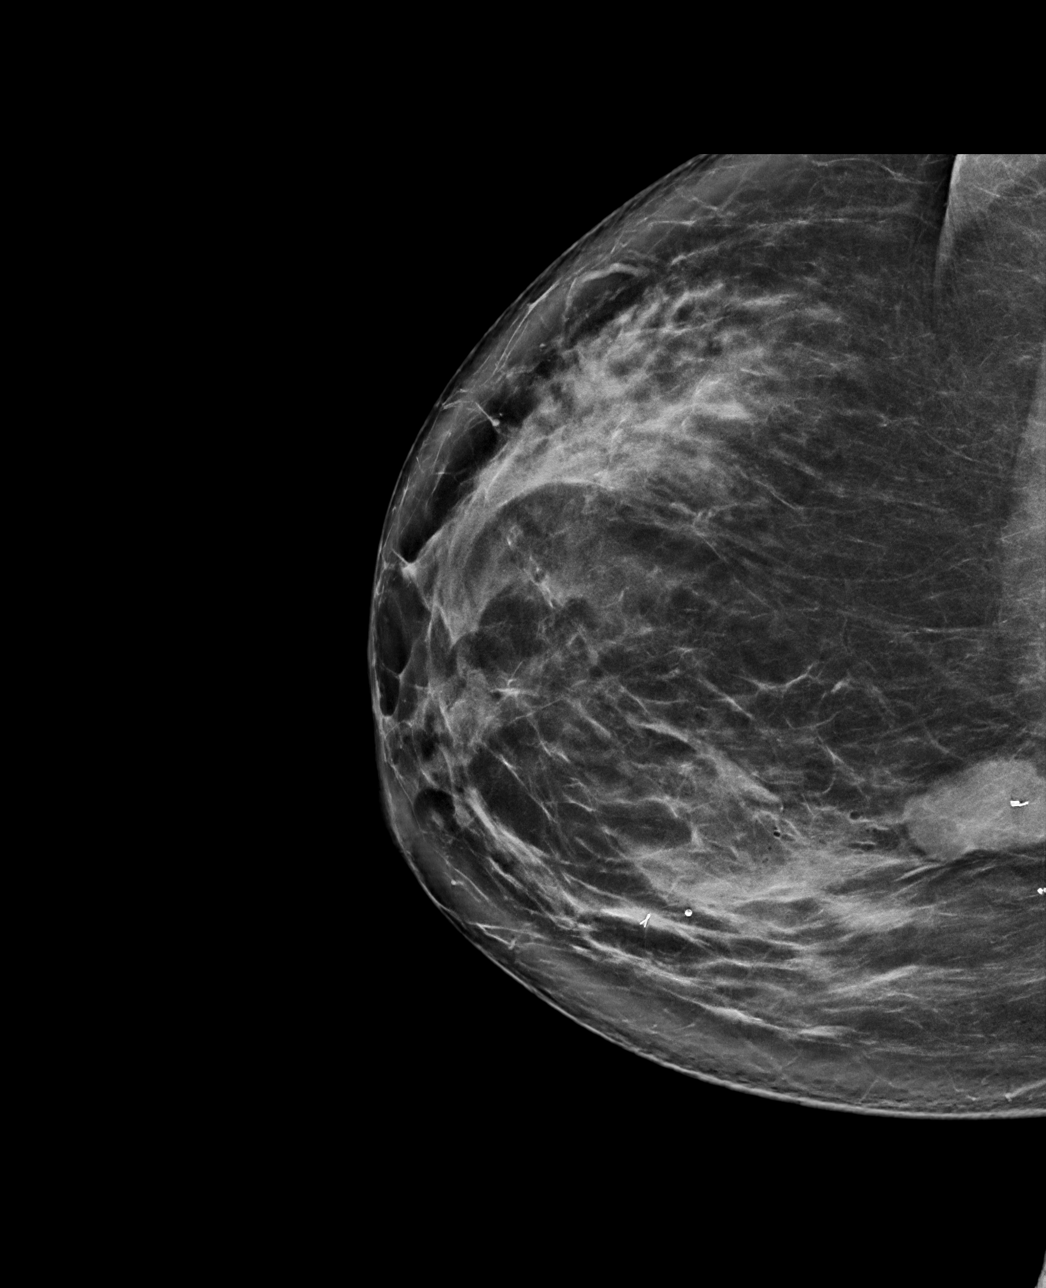

[R ML synth-2D]
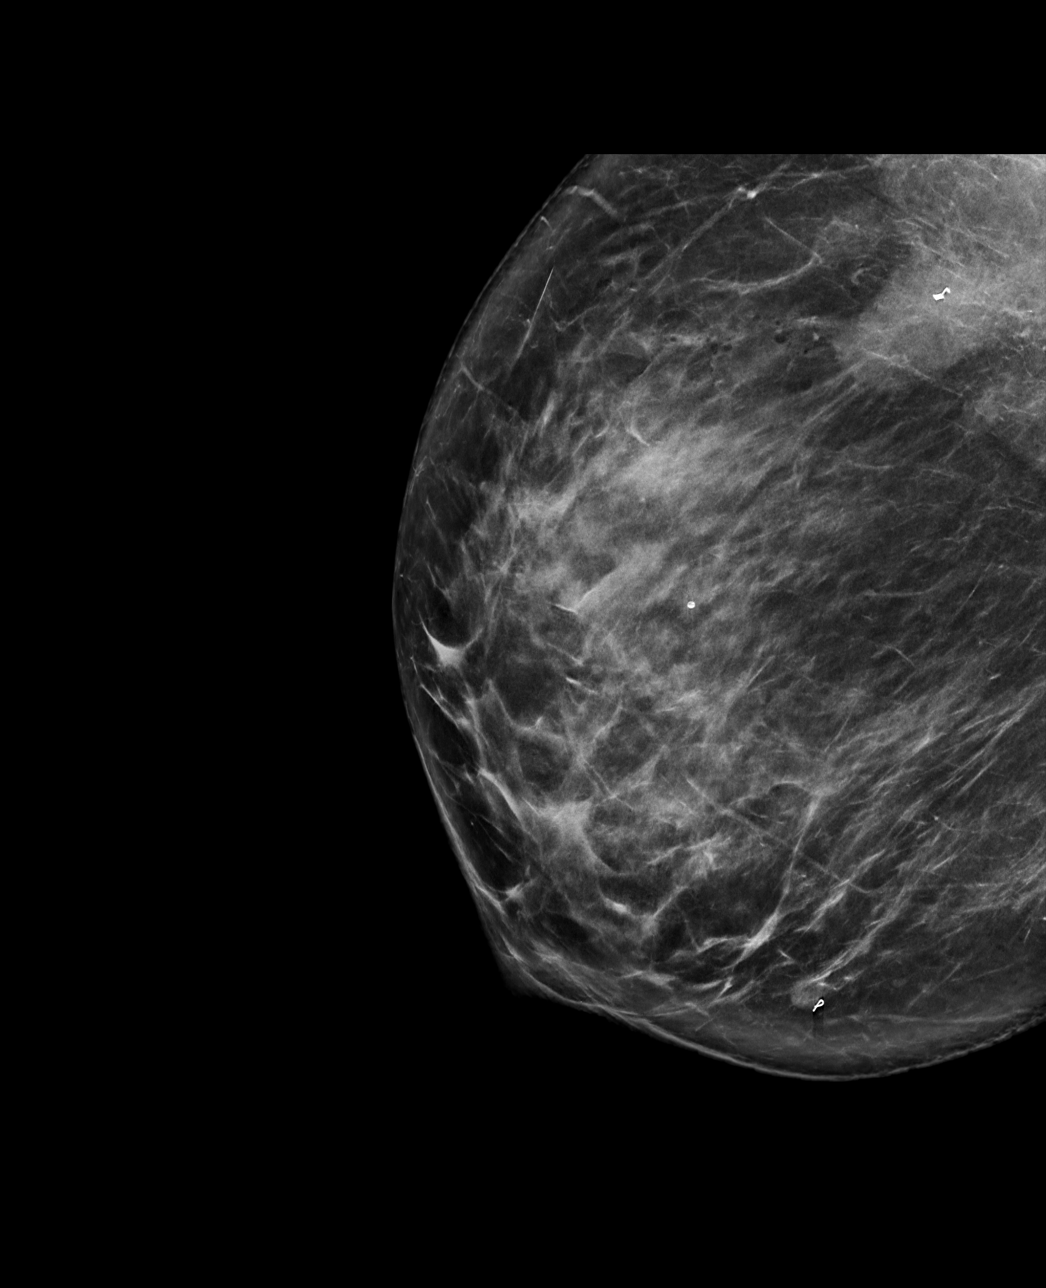

[R CC tomo · tomo slice 49/96.0]
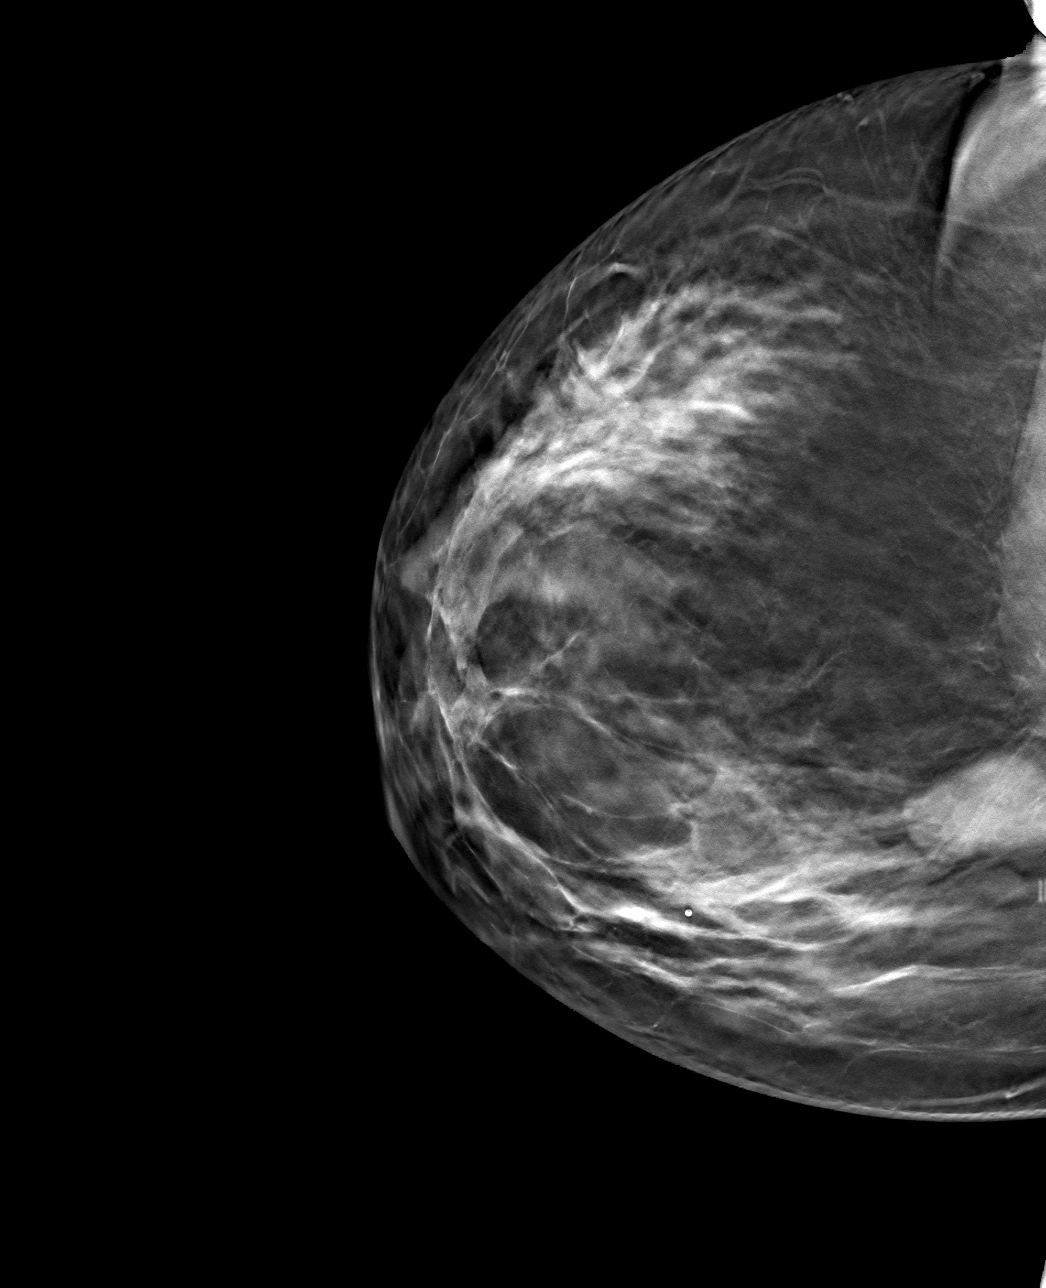

[R ML tomo · tomo slice 49/97.0]
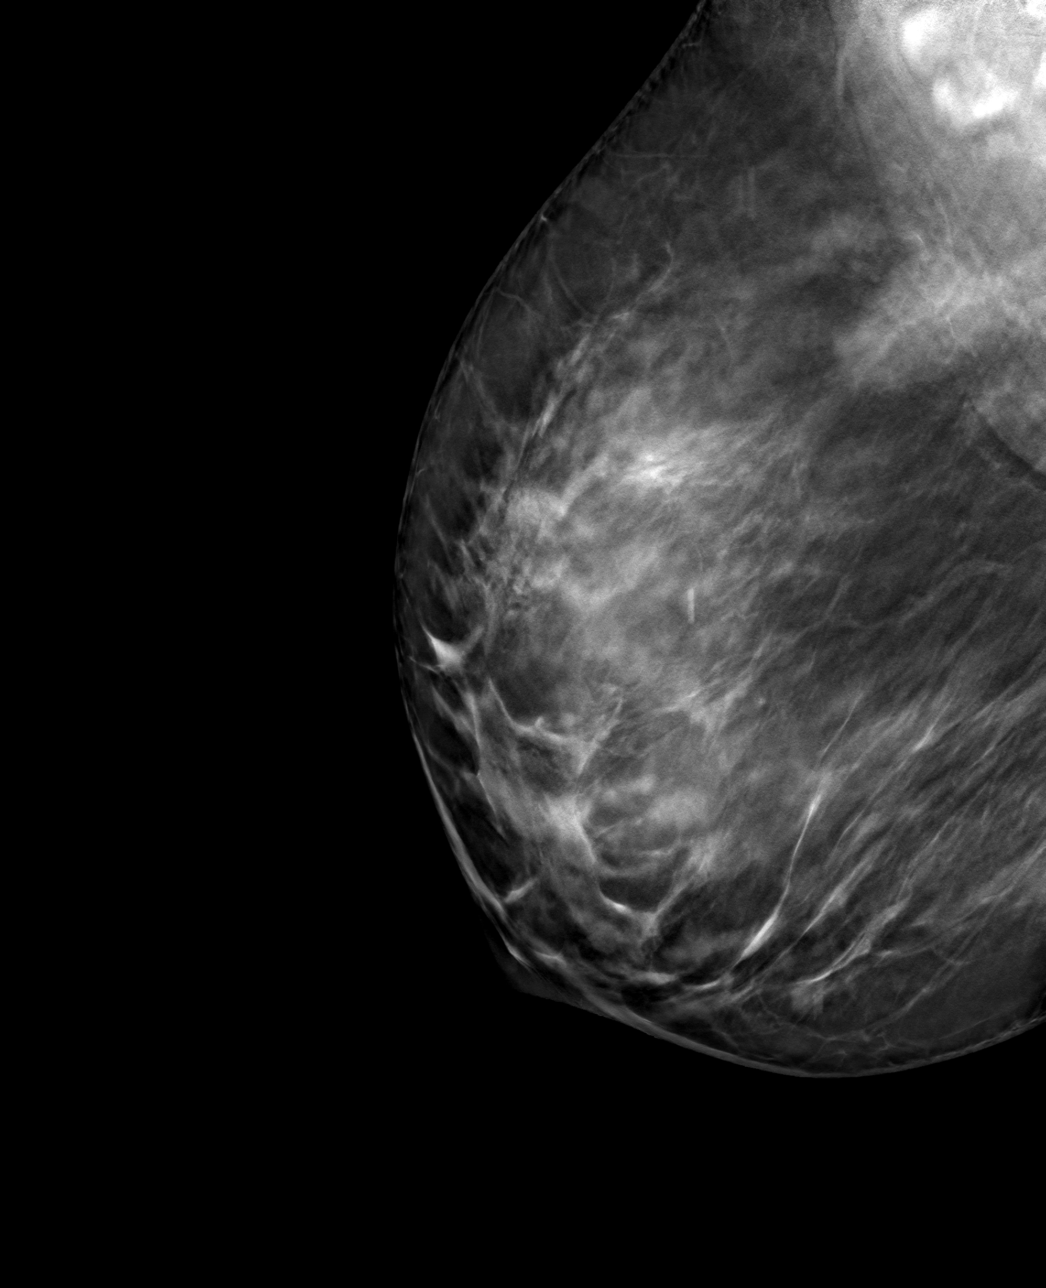

[4 of 12 positions shown; findings below may reference images not displayed]

FINDINGS: 3D Mammographic images were obtained following ultrasound guided
biopsy of the 2.9 cm mass at the 1 o'clock position of the RIGHT
breast. The COIL biopsy marking clip is in expected position at the
site of biopsy.
IMPRESSION: Appropriate positioning of the COIL shaped biopsy marking clip at
the site of biopsy in the UPPER INNER RIGHT breast.

Final Assessment: Post Procedure Mammograms for Marker Placement

## 2021-06-03 IMAGING — US US BREAST*R* LIMITED INC AXILLA
1 series · 8 of 8 positions shown · non-contrast
Comparison: Previous exam(s).
COMPARISON: Previous exam(s).

Addendum:
CLINICAL DATA: 60-year-old female with a palpable right breast lump
for 2 weeks.

EXAM:
DIGITAL DIAGNOSTIC BILATERAL MAMMOGRAM WITH TOMOSYNTHESIS AND CAD;
ULTRASOUND RIGHT BREAST LIMITED
TECHNIQUE: Bilateral digital diagnostic mammography and breast tomosynthesis
was performed. The images were evaluated with computer-aided
detection.; Targeted ultrasound examination of the right breast was
performed

[Series 1: us breast*right* limited inc axilla · 0.07mm/px · 8 of 8 slices shown]
[im 1/8]
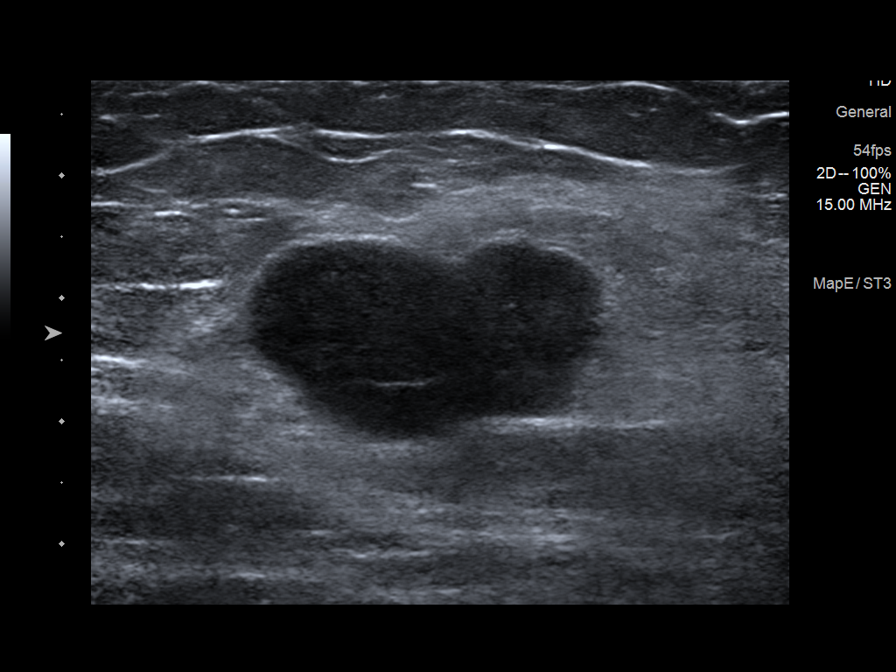
[im 2/8]
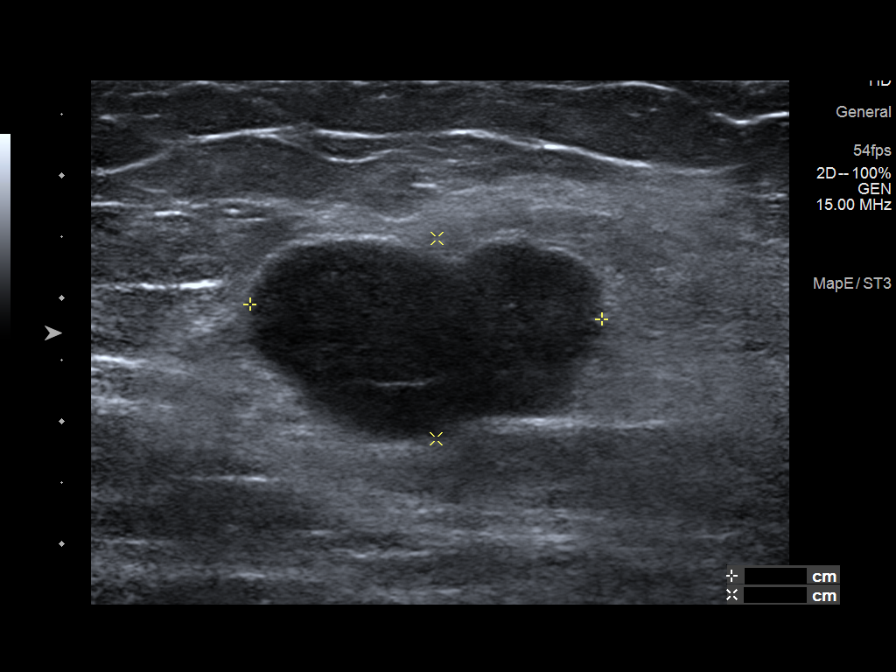
[im 3/8]
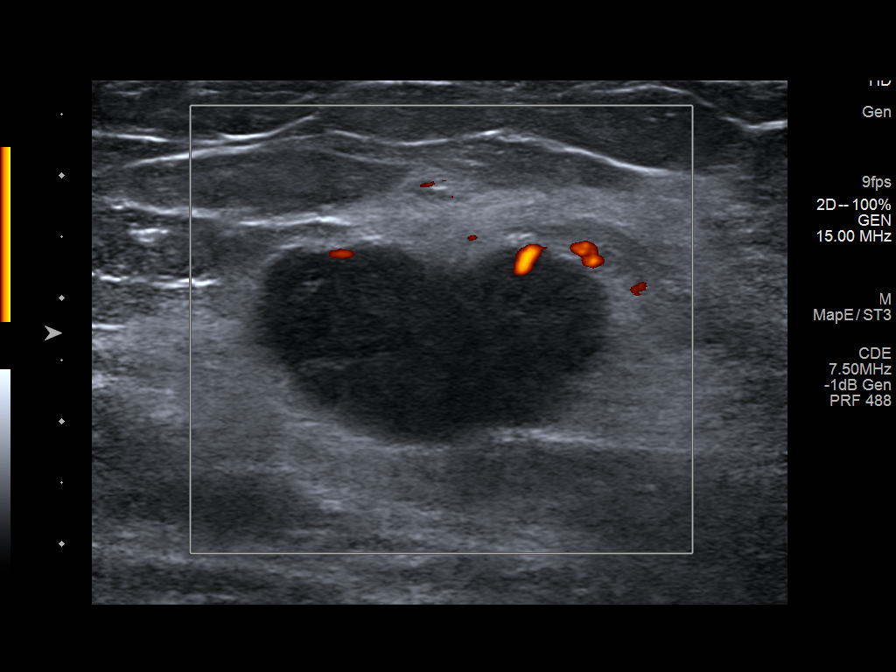
[im 4/8]
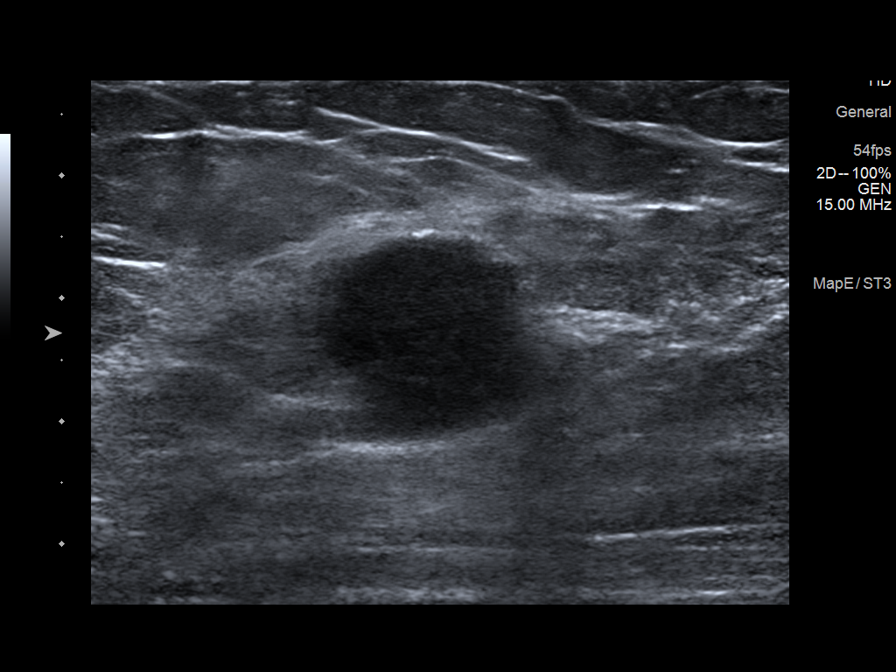
[im 5/8]
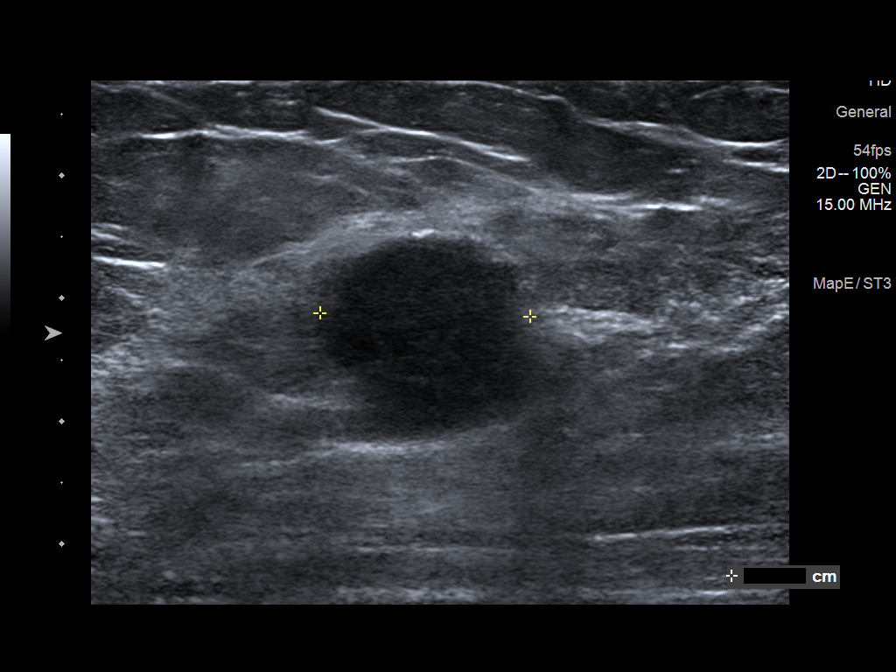
[im 6/8]
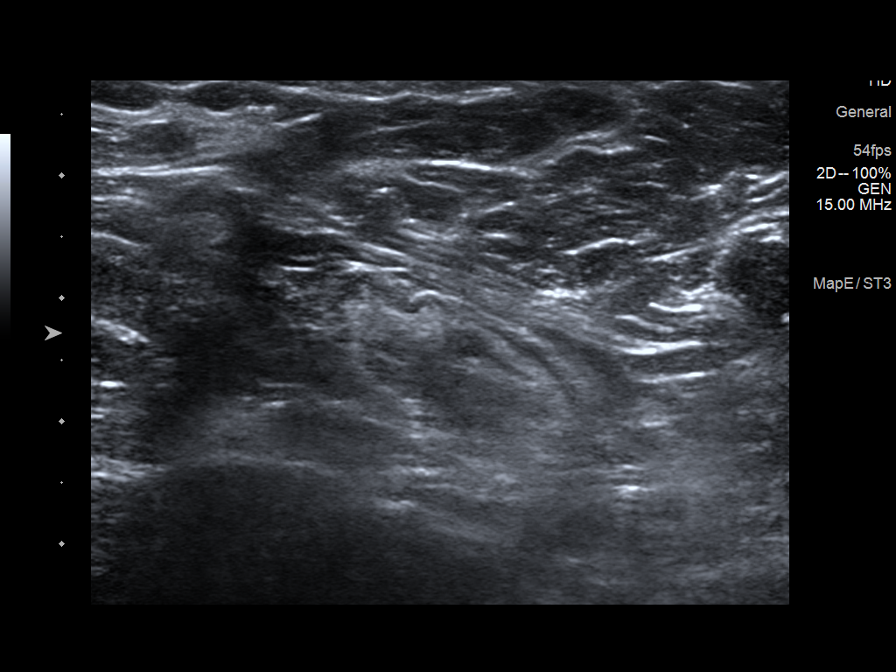
[im 7/8]
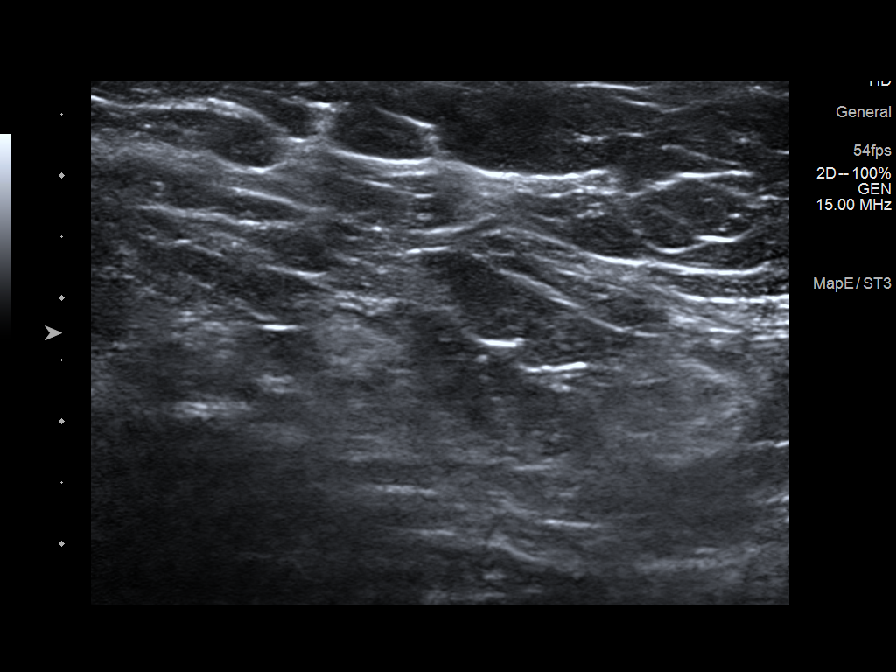
[im 8/8]
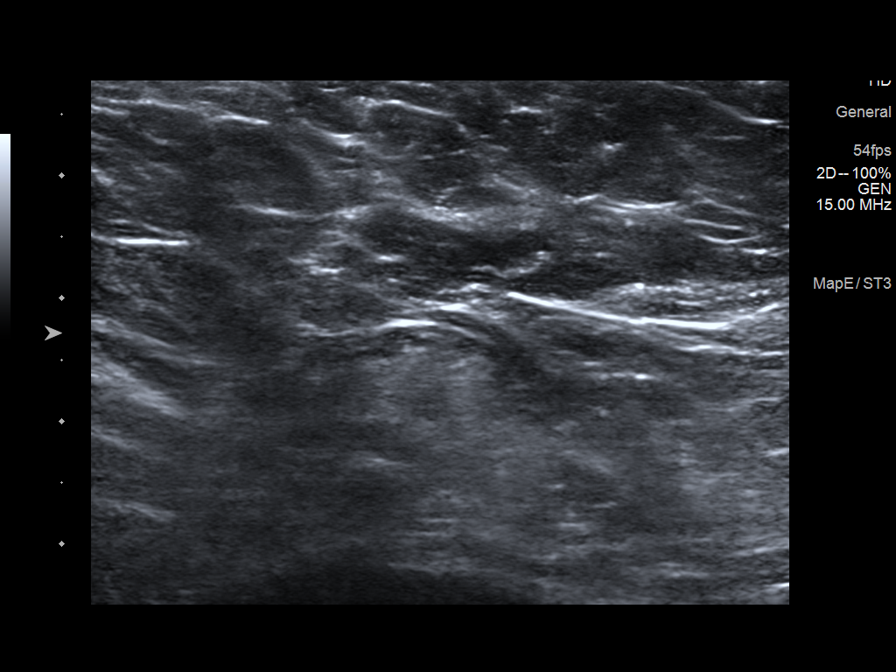

[8 of 8 positions shown; findings below may reference images not displayed]

ACR Breast Density Category c: The breast tissue is heterogeneously
dense, which may obscure small masses.
FINDINGS: A radiopaque BB was placed at the site of the patient's palpable
lump in the upper inner quadrant of the right breast. An irregular
mass with spiculated borders is demonstrated just deep to the
radiopaque BB. Otherwise, no new or suspicious findings in the
remainder of the right breast.

Additional mammographic evaluation of the left breast demonstrates
no focal or suspicious abnormality. The parenchymal pattern is
stable.

On physical exam, I palpate a firm, fixed mass in the upper inner
quadrant of the left breast.

Targeted ultrasound is performed, showing an irregular hypoechoic
mass with associated vascularity at the 1 o'clock position 10 cm
from the nipple. It measures 2.9 x 1.6 x 1.7 cm. This correlates
with the mammographic finding. Evaluation of the right axilla
demonstrates no suspicious lymphadenopathy.
IMPRESSION: 1. Suspicious right breast mass corresponding with the patient's
palpable lump. Recommendation is for ultrasound-guided biopsy.
2. No suspicious right axillary lymphadenopathy.
3. No mammographic evidence of malignancy on the left.

RECOMMENDATION:
Ultrasound-guided biopsy of the right breast.

I have discussed the findings and recommendations with the patient.
If applicable, a reminder letter will be sent to the patient
regarding the next appointment.

BI-RADS CATEGORY  5: Highly suggestive of malignancy.

ADDENDUM:
This is an addendum to the report dictated on [DATE].

The all caps section should read:

On physical exam I palpate a firm, fixed mass in the upper inner
quadrant of the RIGHT breast. The positive findings on this study
were in the right breast.

*** End of Addendum ***
ACR Breast Density Category c: The breast tissue is heterogeneously
dense, which may obscure small masses.
FINDINGS: A radiopaque BB was placed at the site of the patient's palpable
lump in the upper inner quadrant of the right breast. An irregular
mass with spiculated borders is demonstrated just deep to the
radiopaque BB. Otherwise, no new or suspicious findings in the
remainder of the right breast.

Additional mammographic evaluation of the left breast demonstrates
no focal or suspicious abnormality. The parenchymal pattern is
stable.

On physical exam, I palpate a firm, fixed mass in the upper inner
quadrant of the left breast.

Targeted ultrasound is performed, showing an irregular hypoechoic
mass with associated vascularity at the 1 o'clock position 10 cm
from the nipple. It measures 2.9 x 1.6 x 1.7 cm. This correlates
with the mammographic finding. Evaluation of the right axilla
demonstrates no suspicious lymphadenopathy.
IMPRESSION: 1. Suspicious right breast mass corresponding with the patient's
palpable lump. Recommendation is for ultrasound-guided biopsy.
2. No suspicious right axillary lymphadenopathy.
3. No mammographic evidence of malignancy on the left.

RECOMMENDATION:
Ultrasound-guided biopsy of the right breast.

I have discussed the findings and recommendations with the patient.
If applicable, a reminder letter will be sent to the patient
regarding the next appointment.

BI-RADS CATEGORY  5: Highly suggestive of malignancy.

## 2021-06-03 IMAGING — US US  BREAST BX W/ LOC DEV 1ST LESION IMG BX SPEC US GUIDE*R*
1 series · 10 of 10 positions shown · non-contrast
Comparison: Previous exam(s).
COMPARISON: Previous exam(s).

Addendum:
CLINICAL DATA: 60-year-old female for tissue sampling of 2.9 cm
UPPER INNER RIGHT breast mass.

EXAM:
ULTRASOUND GUIDED RIGHT BREAST CORE NEEDLE BIOPSY

[Series 1: us breast bx w/ loc dev 1st lesion img bx spec us  · 0.07mm/px · 10 of 10 slices shown]
[im 1/10]
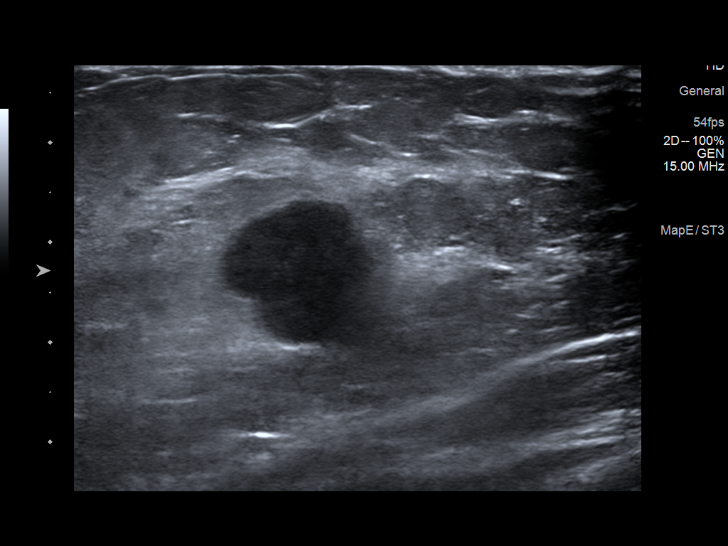
[im 2/10]
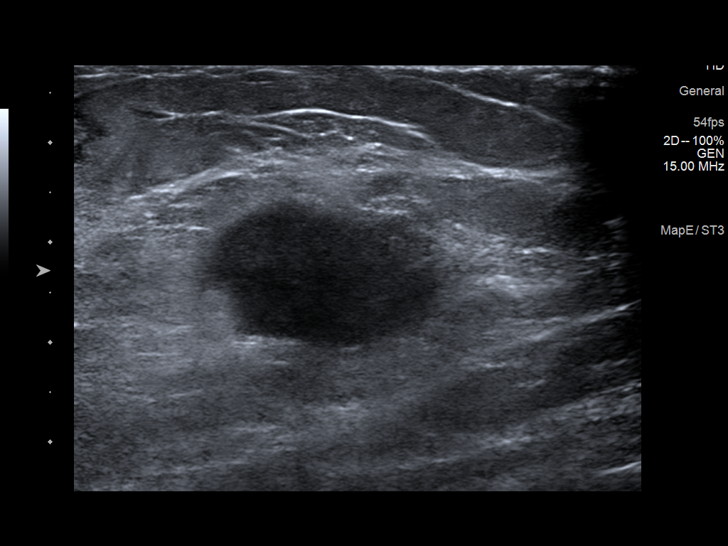
[im 3/10]
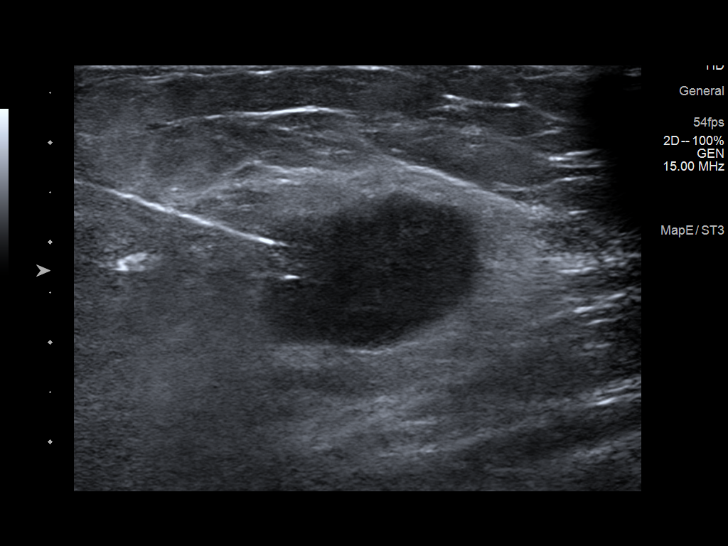
[im 4/10]
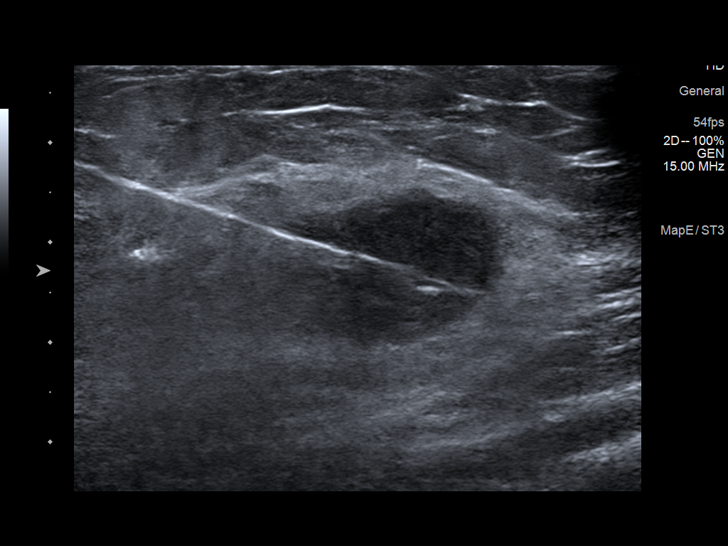
[im 5/10]
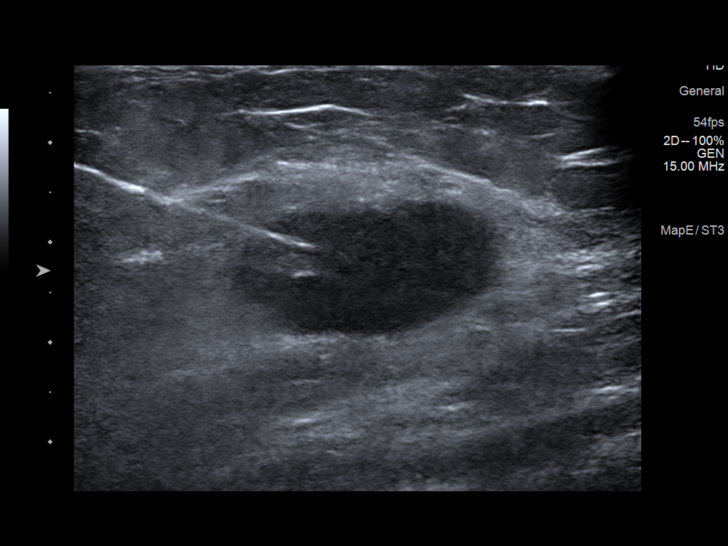
[im 6/10]
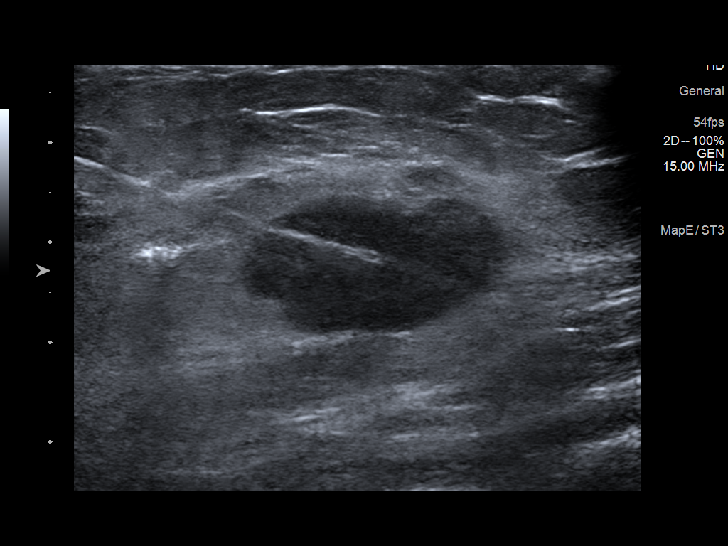
[im 7/10]
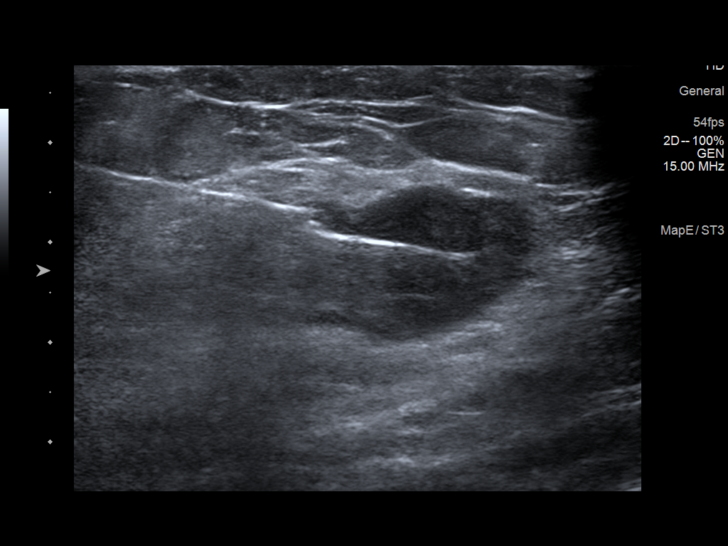
[im 8/10]
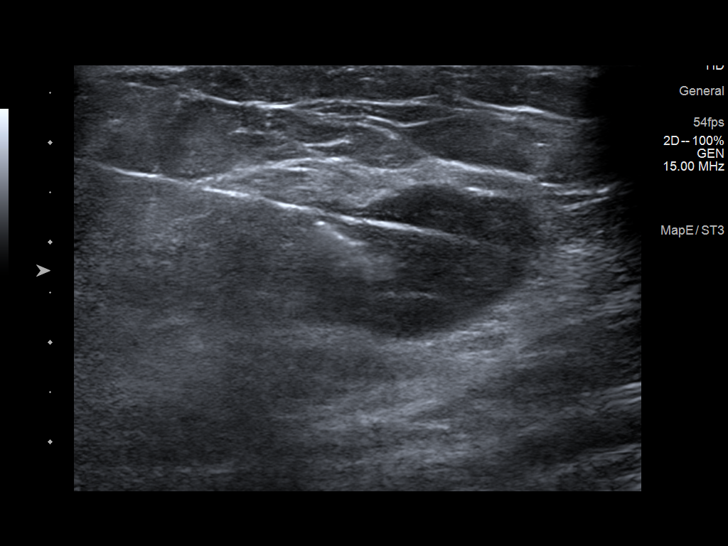
[im 9/10]
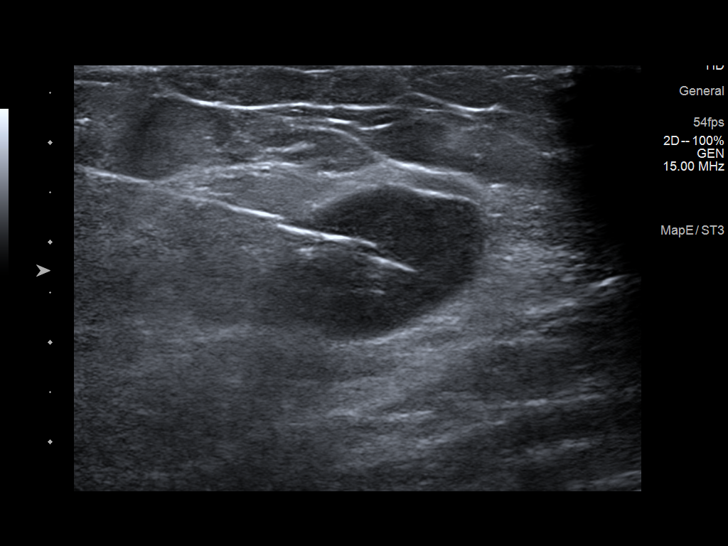
[im 10/10]
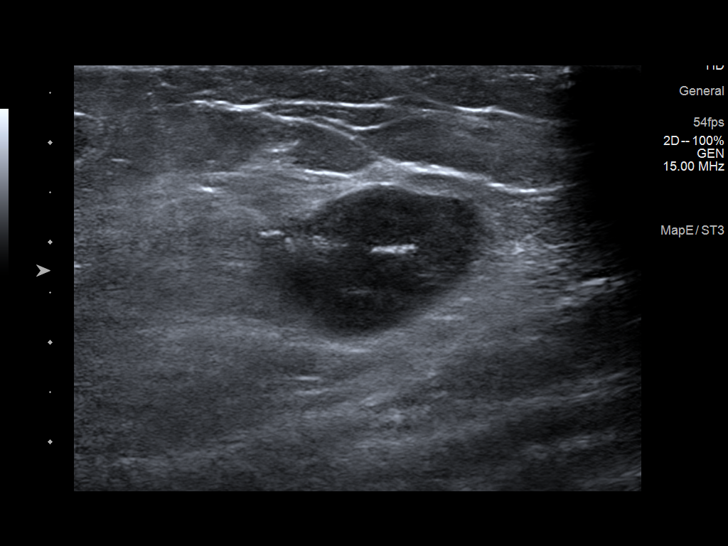

[10 of 10 positions shown; findings below may reference images not displayed]



Lesion quadrant: UPPER INNER RIGHT breast

Using sterile technique and 1% Lidocaine as local anesthetic, under
direct ultrasound visualization, a 12 gauge DAGADZI device was
used to perform biopsy of the 2.9 cm mass at the 1 o'clock position
of the RIGHT breast 10 cm from the nipple using a LATERAL approach.
At the conclusion of the procedure a COIL tissue marker clip was
deployed into the biopsy cavity. Follow up 2 view mammogram was
performed and dictated separately.
IMPRESSION: Ultrasound guided biopsy of 2.9 cm UPPER INNER RIGHT breast mass. No
apparent complications.

ADDENDUM:
Pathology revealed GRADE III INVASIVE DUCTAL CARCINOMA of the RIGHT
breast, 1:00 o'clock, (coil clip). This was found to be concordant
by Dr. DAGADZI.

Pathology results were discussed with the patient by telephone. The
patient reported doing well after the biopsy with tenderness at the
site. Post biopsy instructions and care were reviewed and questions
were answered. The patient was encouraged to call The [REDACTED]

Surgical consultation has been arranged with Dr. DAGADZI at
[REDACTED] on [DATE].

Pathology results reported by DAGADZI RN on [DATE].



Lesion quadrant: UPPER INNER RIGHT breast

Using sterile technique and 1% Lidocaine as local anesthetic, under
direct ultrasound visualization, a 12 gauge DAGADZI device was
used to perform biopsy of the 2.9 cm mass at the 1 o'clock position
of the RIGHT breast 10 cm from the nipple using a LATERAL approach.
At the conclusion of the procedure a COIL tissue marker clip was
deployed into the biopsy cavity. Follow up 2 view mammogram was
performed and dictated separately.
IMPRESSION: Ultrasound guided biopsy of 2.9 cm UPPER INNER RIGHT breast mass. No
apparent complications.

## 2021-06-08 ENCOUNTER — Other Ambulatory Visit: Payer: Self-pay | Admitting: General Surgery

## 2021-06-09 ENCOUNTER — Inpatient Hospital Stay (HOSPITAL_BASED_OUTPATIENT_CLINIC_OR_DEPARTMENT_OTHER): Payer: BC Managed Care – PPO | Admitting: Genetic Counselor

## 2021-06-09 ENCOUNTER — Inpatient Hospital Stay: Payer: BC Managed Care – PPO | Attending: Genetic Counselor

## 2021-06-09 ENCOUNTER — Inpatient Hospital Stay (HOSPITAL_BASED_OUTPATIENT_CLINIC_OR_DEPARTMENT_OTHER): Payer: BC Managed Care – PPO | Admitting: Oncology

## 2021-06-09 ENCOUNTER — Other Ambulatory Visit: Payer: Self-pay

## 2021-06-09 ENCOUNTER — Encounter: Payer: Self-pay | Admitting: Genetic Counselor

## 2021-06-09 ENCOUNTER — Encounter: Payer: Self-pay | Admitting: *Deleted

## 2021-06-09 VITALS — BP 125/77 | HR 73 | Temp 97.4°F | Resp 18 | Ht 68.0 in | Wt 201.6 lb

## 2021-06-09 DIAGNOSIS — Z8042 Family history of malignant neoplasm of prostate: Secondary | ICD-10-CM | POA: Insufficient documentation

## 2021-06-09 DIAGNOSIS — Z170 Estrogen receptor positive status [ER+]: Secondary | ICD-10-CM

## 2021-06-09 DIAGNOSIS — Z8041 Family history of malignant neoplasm of ovary: Secondary | ICD-10-CM | POA: Insufficient documentation

## 2021-06-09 DIAGNOSIS — N6322 Unspecified lump in the left breast, upper inner quadrant: Secondary | ICD-10-CM | POA: Diagnosis not present

## 2021-06-09 DIAGNOSIS — Z8249 Family history of ischemic heart disease and other diseases of the circulatory system: Secondary | ICD-10-CM | POA: Insufficient documentation

## 2021-06-09 DIAGNOSIS — Z800 Family history of malignant neoplasm of digestive organs: Secondary | ICD-10-CM | POA: Insufficient documentation

## 2021-06-09 DIAGNOSIS — Z171 Estrogen receptor negative status [ER-]: Secondary | ICD-10-CM | POA: Insufficient documentation

## 2021-06-09 DIAGNOSIS — Z803 Family history of malignant neoplasm of breast: Secondary | ICD-10-CM

## 2021-06-09 DIAGNOSIS — Z808 Family history of malignant neoplasm of other organs or systems: Secondary | ICD-10-CM | POA: Diagnosis not present

## 2021-06-09 DIAGNOSIS — C50211 Malignant neoplasm of upper-inner quadrant of right female breast: Secondary | ICD-10-CM | POA: Insufficient documentation

## 2021-06-09 DIAGNOSIS — Z5112 Encounter for antineoplastic immunotherapy: Secondary | ICD-10-CM | POA: Diagnosis present

## 2021-06-09 DIAGNOSIS — Z79899 Other long term (current) drug therapy: Secondary | ICD-10-CM

## 2021-06-09 DIAGNOSIS — Z5111 Encounter for antineoplastic chemotherapy: Secondary | ICD-10-CM | POA: Diagnosis present

## 2021-06-09 LAB — CMP (CANCER CENTER ONLY)
ALT: 24 U/L (ref 0–44)
AST: 21 U/L (ref 15–41)
Albumin: 3.9 g/dL (ref 3.5–5.0)
Alkaline Phosphatase: 87 U/L (ref 38–126)
Anion gap: 9 (ref 5–15)
BUN: 18 mg/dL (ref 6–20)
CO2: 26 mmol/L (ref 22–32)
Calcium: 9.9 mg/dL (ref 8.9–10.3)
Chloride: 104 mmol/L (ref 98–111)
Creatinine: 1.24 mg/dL — ABNORMAL HIGH (ref 0.44–1.00)
GFR, Estimated: 50 mL/min — ABNORMAL LOW (ref >60)
Glucose, Bld: 82 mg/dL (ref 70–99)
Potassium: 4.3 mmol/L (ref 3.5–5.1)
Sodium: 139 mmol/L (ref 135–145)
Total Bilirubin: 0.4 mg/dL (ref 0.3–1.2)
Total Protein: 7.4 g/dL (ref 6.5–8.1)

## 2021-06-09 LAB — CBC WITH DIFFERENTIAL (CANCER CENTER ONLY)
Abs Immature Granulocytes: 0.02 10*3/uL (ref 0.00–0.07)
Basophils Absolute: 0 10*3/uL (ref 0.0–0.1)
Basophils Relative: 0 %
Eosinophils Absolute: 0.2 10*3/uL (ref 0.0–0.5)
Eosinophils Relative: 3 %
HCT: 37.6 % (ref 36.0–46.0)
Hemoglobin: 12.5 g/dL (ref 12.0–15.0)
Immature Granulocytes: 0 %
Lymphocytes Relative: 30 %
Lymphs Abs: 2.2 10*3/uL (ref 0.7–4.0)
MCH: 30 pg (ref 26.0–34.0)
MCHC: 33.2 g/dL (ref 30.0–36.0)
MCV: 90.4 fL (ref 80.0–100.0)
Monocytes Absolute: 0.5 10*3/uL (ref 0.1–1.0)
Monocytes Relative: 7 %
Neutro Abs: 4.3 10*3/uL (ref 1.7–7.7)
Neutrophils Relative %: 60 %
Platelet Count: 247 10*3/uL (ref 150–400)
RBC: 4.16 MIL/uL (ref 3.87–5.11)
RDW: 14.6 % (ref 11.5–15.5)
WBC Count: 7.2 10*3/uL (ref 4.0–10.5)
nRBC: 0 % (ref 0.0–0.2)

## 2021-06-09 LAB — GENETIC SCREENING ORDER

## 2021-06-09 MED ORDER — DEXAMETHASONE 4 MG PO TABS: ORAL_TABLET | ORAL | 1 refills | Status: DC

## 2021-06-09 MED ORDER — LIDOCAINE-PRILOCAINE 2.5-2.5 % EX CREA: TOPICAL_CREAM | CUTANEOUS | 3 refills | Status: DC

## 2021-06-09 MED ORDER — PROCHLORPERAZINE MALEATE 10 MG PO TABS: 10.0000 mg | ORAL_TABLET | Freq: Four times a day (QID) | ORAL | 1 refills | Status: DC | PRN

## 2021-06-09 NOTE — Progress Notes (Signed)
START ON PATHWAY REGIMEN - Breast ? ? ?  Cycles 1 through 4: A cycle is every 21 days: ?    Pembrolizumab  ?    Paclitaxel  ?    Carboplatin  ?    Filgrastim-xxxx  ?  Cycles 5 through 8: A cycle is every 21 days: ?    Pembrolizumab  ?    Doxorubicin  ?    Cyclophosphamide  ?    Pegfilgrastim-xxxx  ? ?**Always confirm dose/schedule in your pharmacy ordering system** ? ?Patient Characteristics: ?Preoperative or Nonsurgical Candidate (Clinical Staging), Neoadjuvant Therapy followed by Surgery, Invasive Disease, Chemotherapy, HER2 Negative/Unknown/Equivocal, ER Negative/Unknown, Platinum Therapy Indicated and Candidate for Checkpoint Inhibitor ?Therapeutic Status: Preoperative or Nonsurgical Candidate (Clinical Staging) ?AJCC M Category: cM0 ?AJCC Grade: G3 ?Breast Surgical Plan: Neoadjuvant Therapy followed by Surgery ?ER Status: Negative (-) ?AJCC 8 Stage Grouping: IIB ?HER2 Status: Negative (-) ?AJCC T Category: cT2 ?AJCC N Category: cN0 ?PR Status: Negative (-) ?Intent of Therapy: ?Curative Intent, Discussed with Patient ?

## 2021-06-09 NOTE — Progress Notes (Signed)
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  She brought it to medical attention and her mammogram was moved up to 06/03/2021 at the breast center.  This found the breast to be density category C.  On physical exam there was a palpable firm fixed mass in the upper inner quadrant of the right breast.  On the mammogram there was an irregular mass with spiculated borders which on ultrasound measured 2.9 cm at the 1 o'clock position 10 cm from the nipple.  The right axilla was sonographically benign.  Biopsy of the right breast mass in question 06/03/2021 showed an invasive ductal carcinoma, grade 3, estrogen receptor "positive" at 5% with weak staining intensity (functionally estrogen receptor negative), progesterone receptor negative, HER2 equivocal by immunotherapy but negative by FISH, with a proliferation marker of 95%.  The patient's subsequent history is as detailed below   INTERVAL HISTORY: The patient was evaluated in the breast clinic 06/09/2021.   REVIEW OF SYSTEMS: Rita Davis denies unusual headaches, visual changes, nausea, vomiting, stiff neck, dizziness, or gait imbalance. There has been no cough, phlegm production, or pleurisy, no chest pain or pressure, and no change in bowel or bladder habits. The patient denies fever, rash, bleeding, unexplained fatigue or unexplained weight loss.  Because of a left ankle injury January 2022 she is not exercising as much as  she should she says.  A detailed review of systems was otherwise entirely negative.  COVID: Status post [employer] x3 as of August 2022   PAST MEDICAL HISTORY: Past Medical History:  Diagnosis Date   Anxiety    Diabetes mellitus without complication ([place])    Family history of breast cancer    Family history of colon cancer    Family history of prostate cancer    Hyperlipidemia    Hypertension     PAST SURGICAL HISTORY: Past Surgical History:  Procedure Laterality Date   ABDOMINAL HYSTERECTOMY     still has ovaries   BREAST BIOPSY Right 05/23/2012   BREAST CYST EXCISION Right    cyst removed     from lower back   DILATION AND CURETTAGE OF UTERUS     ORIF ANKLE FRACTURE Left 11/13/2020   Procedure: OPEN TREATMENT OF LEFT TRIMALLEOLAR ANKLE FRACTURE WITH POSTERIOR FIXATION, SYNDESMOSIS;  Surgeon: Erle Crocker, MD;  Location: [facility];  Service: Orthopedics;  Laterality: Left;  LENGTH OF SURGERY: 1.5 HOURS    FAMILY HISTORY Family History  Problem Relation Age of Onset   Hypertension Mother    Breast cancer Mother 34       declined treatment   Prostate cancer Father        metastatic, dx 75s   Colon cancer Brother 68   Cancer Maternal Great-grandmother 50       gynecologic cancer (MGM's mother)  The patient's father died at the age of 41, having had breast cancer twice.  The patient has little information on his side of the family.  The patient's mother is 33 years old as of August  January 18, 2021.  She is currently under hospice care for metastatic breast cancer having refused active treatment (she was evaluated by Dr. Lindi Adie).  Also on the maternal side there is a great grandmother with ovarian cancer.  The patient had one half brother who died from colon cancer at the age of 70.  Another half brother survives.  GYNECOLOGIC HISTORY:  No LMP recorded. Patient has had a hysterectomy. Menarche: 60 years old Age at first live birth: 60 years old [place] P 2 LMP  01-19-2015 Contraceptive several years with no complications HRT no Hysterectomy?  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  Her husband died 03/18/2020 from a cardiac arrest in the setting of renal failure.  The patient lives by herself with no pets.  Son Rita Davis graduated from [employer] and works for [employer] in [place].  Son Rita Davis graduated from [employer] with a business administration degree and placed in a band    ADVANCED DIRECTIVES: Not in place.  As of 06/09/2021 the patient has not discussed her diagnosis of breast cancer with her children.   HEALTH MAINTENANCE: Social History   Tobacco Use   Smoking status: Never   Smokeless tobacco: Never  Substance Use Topics   Alcohol use: No   Drug use: No     Colonoscopy:  PAP:  Bone density:   No Known Allergies  Current Outpatient Medications  Medication Sig Dispense Refill   amLODipine-olmesartan (AZOR) 5-40 MG tablet Take 1 tablet by mouth daily.     bimatoprost (LATISSE) 0.03 % ophthalmic solution Place one drop on applicator and apply evenly along the skin of the upper eyelid at base of eyelashes once daily at bedtime; repeat procedure for second eye (use a clean applicator).     escitalopram (LEXAPRO) 10 MG tablet Take 10 mg by mouth as needed.      ibuprofen (ADVIL,MOTRIN) 800 MG tablet Take 1 tablet (800 mg total) by mouth every 8 (eight) hours as needed. 30 tablet 1   Multiple Vitamin (MULTI-VITAMINS) TABS Take by mouth.     aspirin (BAYER ASPIRIN) 325 MG tablet Take 1 tablet (325 mg total) by mouth daily. (Patient not taking: Reported on 06/09/2021) 30 tablet 11   betamethasone valerate (VALISONE) 0.1 % cream  (Patient not taking: Reported on 06/09/2021)     METFORMIN HCL PO Take by mouth. (Patient not taking: Reported on 06/09/2021)     methocarbamol (ROBAXIN) 500 MG tablet      naproxen (NAPROSYN) 500 MG tablet      pravastatin (PRAVACHOL) 20 MG tablet Take 20  mg by mouth daily.     promethazine (PHENERGAN) 25 MG tablet      vitamin E 1000 UNIT capsule Take 1,000 Units by mouth daily.     No current facility-administered medications for this visit.    OBJECTIVE: African-American woman who appears well  Vitals:   06/09/21 1556  BP: 125/77  Pulse: 73  Resp: 18  Temp: (!) 97.4 F (36.3 C)  SpO2: 100%     Body mass index is 30.65 kg/m.   Wt Readings from Last 3 Encounters:  06/09/21 201 lb 9.6 oz (91.4 kg)  11/13/20 202 lb 9.6 oz (91.9 kg)  01/31/13 184 lb (83.5 kg)      ECOG FS:1 - Symptomatic but completely ambulatory  Ocular: Sclerae unicteric, pupils round and equal Ear-nose-throat: Oropharynx clear and moist Lymphatic: No cervical or supraclavicular adenopathy Lungs no rales or rhonchi Heart  regular rate and rhythm Abd soft, nontender, positive bowel sounds MSK no focal spinal tenderness, no joint edema Neuro: non-focal, well-oriented, appropriate affect Breasts: The upper inner quadrant of the right breast there is an approximately 3 cm mass which is somewhat movable, firm, nontender, with no skin involvement and no nipple involvement.  The left breast and both axillae are benign.   LAB RESULTS:  CMP     Component Value Date/Time   NA 139 06/09/2021 1538   K 4.3 06/09/2021 1538   CL 104 06/09/2021 1538   CO2 26 06/09/2021 1538   GLUCOSE 82 06/09/2021 1538   BUN 18 06/09/2021 1538   CREATININE 1.24 (H) 06/09/2021 1538   CALCIUM 9.9 06/09/2021 1538   PROT 7.4 06/09/2021 1538   ALBUMIN 3.9 06/09/2021 1538   AST 21 06/09/2021 1538   ALT 24 06/09/2021 1538   ALKPHOS 87 06/09/2021 1538   BILITOT 0.4 06/09/2021 1538   GFRNONAA 50 (L) 06/09/2021 1538   GFRAA 86 04/18/2008 1652    No results found for: TOTALPROTELP, ALBUMINELP, A1GS, A2GS, BETS, BETA2SER, GAMS, MSPIKE, SPEI  No results found for: Nils Pyle, [facility]  Lab Results  Component Value Date   WBC 7.2 06/09/2021   NEUTROABS 4.3  06/09/2021   HGB 12.5 06/09/2021   HCT 37.6 06/09/2021   MCV 90.4 06/09/2021   PLT 247 06/09/2021      Chemistry      Component Value Date/Time   NA 139 06/09/2021 1538   K 4.3 06/09/2021 1538   CL 104 06/09/2021 1538   CO2 26 06/09/2021 1538   BUN 18 06/09/2021 1538   CREATININE 1.24 (H) 06/09/2021 1538      Component Value Date/Time   CALCIUM 9.9 06/09/2021 1538   ALKPHOS 87 06/09/2021 1538   AST 21 06/09/2021 1538   ALT 24 06/09/2021 1538   BILITOT 0.4 06/09/2021 1538       No results found for: LABCA2  No components found for: MWNUUV253  No results for input(s): INR in the last 168 hours.  No results found for: LABCA2  No results found for: GUY403  No results found for: KVQ259  No results found for: DGL875  No results found for: CA2729  No components found for: HGQUANT  No results found for: CEA1 / No results found for: CEA1   No results found for: AFPTUMOR  No results found for: CHROMOGRNA  No results found for: PSA1  Appointment on 06/09/2021  Component Date Value Ref Range Status   Sodium 06/09/2021 139  135 - 145 mmol/L Final   Potassium 06/09/2021 4.3  3.5 - 5.1 mmol/L Final   Chloride 06/09/2021 104  98 - 111 mmol/L Final   CO2 06/09/2021 26  22 - 32 mmol/L Final   Glucose, Bld 06/09/2021 82  70 - 99 mg/dL Final   Glucose reference range applies only to samples taken after fasting for at least 8 hours.   BUN 06/09/2021 18  6 - 20 mg/dL Final   Creatinine 06/09/2021 1.24 (A) 0.44 - 1.00 mg/dL Final   Calcium 06/09/2021 9.9  8.9 - 10.3 mg/dL Final   Total Protein 06/09/2021 7.4  6.5 - 8.1 g/dL Final   Albumin 06/09/2021 3.9  3.5 - 5.0 g/dL Final   AST 06/09/2021 21  15 - 41 U/L Final   ALT 06/09/2021 24  0 - 44 U/L Final   Alkaline Phosphatase 06/09/2021 87  38 - 126 U/L Final   Total Bilirubin 06/09/2021 0.4  0.3 - 1.2 mg/dL Final   GFR, Estimated 06/09/2021 50 (A) >60 mL/min Final   Comment: (NOTE) Calculated using the CKD-EPI  Creatinine Equation (2021)    Anion gap 06/09/2021 9  5 - 15 Final   Performed at [facility], [place] [address] [place], [place] [address]   WBC Count 06/09/2021 7.2  4.0 - 10.5 K/uL Final   RBC 06/09/2021 4.16  3.87 - 5.11 MIL/uL Final   Hemoglobin 06/09/2021 12.5  12.0 - 15.0 g/dL Final   HCT 06/09/2021 37.6  36.0 - 46.0 % Final   MCV 06/09/2021 90.4  80.0 - 100.0 fL Final   MCH 06/09/2021 30.0  26.0 - 34.0 pg Final   MCHC 06/09/2021 33.2  30.0 - 36.0 g/dL Final   RDW 06/09/2021 14.6  11.5 - 15.5 % Final   Platelet Count 06/09/2021 247  150 - 400 K/uL Final   nRBC 06/09/2021 0.0  0.0 - 0.2 % Final   Neutrophils Relative % 06/09/2021 60  % Final   Neutro Abs 06/09/2021 4.3  1.7 - 7.7 K/uL Final   Lymphocytes Relative 06/09/2021 30  % Final   Lymphs Abs 06/09/2021 2.2  0.7 - 4.0 K/uL Final   Monocytes Relative 06/09/2021 7  % Final   Monocytes Absolute 06/09/2021 0.5  0.1 - 1.0 K/uL Final   Eosinophils Relative 06/09/2021 3  % Final   Eosinophils Absolute 06/09/2021 0.2  0.0 - 0.5 K/uL Final   Basophils Relative 06/09/2021 0  % Final   Basophils Absolute 06/09/2021 0.0  0.0 - 0.1 K/uL Final   Immature Granulocytes 06/09/2021 0  % Final   Abs Immature Granulocytes 06/09/2021 0.02  0.00 - 0.07 K/uL Final   Performed at [facility], [address] [place], [place] [address]    (this displays the last labs from the last 3 days)  No results found for: TOTALPROTELP, ALBUMINELP, A1GS, A2GS, BETS, BETA2SER, GAMS, MSPIKE, SPEI (this displays SPEP labs)  No results found for: KPAFRELGTCHN, LAMBDASER, KAPLAMBRATIO (kappa/lambda light chains)  No results found for: HGBA, HGBA2QUANT, HGBFQUANT, HGBSQUAN (Hemoglobinopathy evaluation)   No results found for: LDH  No results found for: IRON, TIBC, IRONPCTSAT (Iron and TIBC)  No results found for: FERRITIN  Urinalysis    Component Value Date/Time   COLORURINE YELLOW 11/20/2007 0951    APPEARANCEUR Sl Cloudy 11/20/2007 0951   LABSPEC > OR = 1.030 11/20/2007 0951   PHURINE 5.5 11/20/2007 0951   GLUCOSEU NEGATIVE 11/20/2007 0951   BILIRUBINUR NEGATIVE 11/20/2007 0951   KETONESUR NEGATIVE 11/20/2007 0951   UROBILINOGEN 0.2 mg/dL 11/20/2007 0951   NITRITE Negative 11/20/2007 0951   LEUKOCYTESUR Negative 11/20/2007 0951     STUDIES: US BREAST LTD UNI RIGHT INC AXILLA  Addendum Date: 06/04/2021   ADDENDUM REPORT: 06/04/2021 08:38 ADDENDUM: This is an addendum to the report dictated on 06/03/2021. The all caps section should read: On physical exam I palpate a firm, fixed mass in the upper inner quadrant of the RIGHT breast. The positive findings on this study were in the right breast. Electronically Signed   By: Kristopher Oppenheim M.D.   On: 06/04/2021 08:38   Result Date: 06/04/2021 CLINICAL DATA:  60 year old female with a palpable right breast lump for 2 weeks. EXAM: DIGITAL DIAGNOSTIC BILATERAL MAMMOGRAM WITH TOMOSYNTHESIS AND CAD; ULTRASOUND RIGHT BREAST LIMITED TECHNIQUE: Bilateral digital diagnostic mammography and breast tomosynthesis was performed. The images were evaluated with computer-aided detection.; Targeted ultrasound examination of the right breast  was performed COMPARISON:  Previous exam(s). ACR Breast Density Category c: The breast tissue is heterogeneously dense, which may obscure small masses. FINDINGS: A radiopaque BB was placed at the site of the patient's palpable lump in the upper inner quadrant of the right breast. An irregular mass with spiculated borders is demonstrated just deep to the radiopaque BB. Otherwise, no new or suspicious findings in the remainder of the right breast. Additional mammographic evaluation of the left breast demonstrates no focal or suspicious abnormality. The parenchymal pattern is stable. On physical exam, I palpate a firm, fixed mass in the upper inner quadrant of the left breast. Targeted ultrasound is performed, showing an irregular  hypoechoic mass with associated vascularity at the 1 o'clock position 10 cm from the nipple. It measures 2.9 x 1.6 x 1.7 cm. This correlates with the mammographic finding. Evaluation of the right axilla demonstrates no suspicious lymphadenopathy. IMPRESSION: 1. Suspicious right breast mass corresponding with the patient's palpable lump. Recommendation is for ultrasound-guided biopsy. 2. No suspicious right axillary lymphadenopathy. 3. No mammographic evidence of malignancy on the left. RECOMMENDATION: Ultrasound-guided biopsy of the right breast. I have discussed the findings and recommendations with the patient. If applicable, a reminder letter will be sent to the patient regarding the next appointment. BI-RADS CATEGORY  5: Highly suggestive of malignancy. Electronically Signed: By: Kristopher Oppenheim M.D. On: 06/03/2021 14:38  MM DIAG BREAST TOMO BILATERAL  Addendum Date: 06/04/2021   ADDENDUM REPORT: 06/04/2021 08:38 ADDENDUM: This is an addendum to the report dictated on 06/03/2021. The all caps section should read: On physical exam I palpate a firm, fixed mass in the upper inner quadrant of the RIGHT breast. The positive findings on this study were in the right breast. Electronically Signed   By: Kristopher Oppenheim M.D.   On: 06/04/2021 08:38   Result Date: 06/04/2021 CLINICAL DATA:  60 year old female with a palpable right breast lump for 2 weeks. EXAM: DIGITAL DIAGNOSTIC BILATERAL MAMMOGRAM WITH TOMOSYNTHESIS AND CAD; ULTRASOUND RIGHT BREAST LIMITED TECHNIQUE: Bilateral digital diagnostic mammography and breast tomosynthesis was performed. The images were evaluated with computer-aided detection.; Targeted ultrasound examination of the right breast was performed COMPARISON:  Previous exam(s). ACR Breast Density Category c: The breast tissue is heterogeneously dense, which may obscure small masses. FINDINGS: A radiopaque BB was placed at the site of the patient's palpable lump in the upper inner quadrant of the  right breast. An irregular mass with spiculated borders is demonstrated just deep to the radiopaque BB. Otherwise, no new or suspicious findings in the remainder of the right breast. Additional mammographic evaluation of the left breast demonstrates no focal or suspicious abnormality. The parenchymal pattern is stable. On physical exam, I palpate a firm, fixed mass in the upper inner quadrant of the left breast. Targeted ultrasound is performed, showing an irregular hypoechoic mass with associated vascularity at the 1 o'clock position 10 cm from the nipple. It measures 2.9 x 1.6 x 1.7 cm. This correlates with the mammographic finding. Evaluation of the right axilla demonstrates no suspicious lymphadenopathy. IMPRESSION: 1. Suspicious right breast mass corresponding with the patient's palpable lump. Recommendation is for ultrasound-guided biopsy. 2. No suspicious right axillary lymphadenopathy. 3. No mammographic evidence of malignancy on the left. RECOMMENDATION: Ultrasound-guided biopsy of the right breast. I have discussed the findings and recommendations with the patient. If applicable, a reminder letter will be sent to the patient regarding the next appointment. BI-RADS CATEGORY  5: Highly suggestive of malignancy. Electronically Signed: By: Kristopher Oppenheim  M.D. On: 06/03/2021 14:38  MM CLIP PLACEMENT RIGHT  Result Date: 06/03/2021 CLINICAL DATA:  Evaluate COIL clip placement following ultrasound-guided RIGHT breast biopsy. EXAM: 3D DIAGNOSTIC RIGHT MAMMOGRAM POST ULTRASOUND BIOPSY COMPARISON:  Previous exam(s). FINDINGS: 3D Mammographic images were obtained following ultrasound guided biopsy of the 2.9 cm mass at the 1 o'clock position of the RIGHT breast. The COIL biopsy marking clip is in expected position at the site of biopsy. IMPRESSION: Appropriate positioning of the COIL shaped biopsy marking clip at the site of biopsy in the UPPER INNER RIGHT breast. Final Assessment: Post Procedure Mammograms for  Marker Placement Electronically Signed   By: Margarette Canada M.D.   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 This was found to be concordant by Dr. Hassan Rowan. Pathology results were discussed with the patient by telephone. The patient reported doing well after the biopsy with tenderness at the site. Post biopsy instructions and care were reviewed and questions were answered. The patient was encouraged to call [facility] for any additional concerns. Surgical consultation has been arranged with Dr. Stark Klein at [facility] on June 08, 2021. Pathology results reported by Stacie Acres RN on 06/04/2021. Electronically Signed   By: Margarette Canada M.D.   On: 06/04/2021 16:50   Result Date: 06/05/2021 CLINICAL DATA:  60 year old female for tissue sampling of 2.9 cm UPPER INNER RIGHT breast mass. EXAM: ULTRASOUND GUIDED RIGHT BREAST CORE NEEDLE BIOPSY COMPARISON:  Previous exam(s). FINDINGS: I met with the patient and we discussed the procedure of ultrasound-guided biopsy, including benefits and alternatives. We discussed the high likelihood of a successful procedure. We discussed the risks of the procedure, including infection, bleeding, tissue injury, clip migration, and inadequate sampling. Informed written consent was given. The usual time-out protocol was performed immediately prior to the procedure. Lesion quadrant: UPPER INNER RIGHT breast Using sterile technique and 1% Lidocaine as local anesthetic, under direct ultrasound visualization, a 12 gauge spring-loaded device was used to perform biopsy of the 2.9 cm mass at the 1 o'clock position of the RIGHT breast 10 cm from the nipple using a LATERAL approach. At the conclusion of the procedure a COIL tissue marker clip  was deployed into the biopsy cavity. Follow up 2 view mammogram was performed and dictated separately. IMPRESSION: Ultrasound guided biopsy of 2.9 cm UPPER INNER RIGHT breast mass. No apparent complications. Electronically Signed: By: Margarette Canada M.D. On: 06/03/2021 15:01   ELIGIBLE FOR AVAILABLE RESEARCH PROTOCOL: no  ASSESSMENT: 60 y.o. [place] woman status post right breast upper inner quadrant biopsy 06/03/2021 for a clinical T2N0 invasive ductal carcinoma, grade 3, functionally triple negative, with an MIB-1 of 95%.  (1) genetics testing  (2) neoadjuvant chemotherapy to consist of carboplatin paclitaxel pembrolizumab for 4 cycles starting 06/23/2021 followed by doxorubicin and cyclophosphamide pembrolizumab for 4 cycles  (3) definitive surgery to follow  (4) adjuvant radiation as appropriate  PLAN: I spent approximately 65 minutes face to face with Rita Davis with more than 50% of that time spent in counseling and coordination of care. Specifically we reviewed the biology of the patient's diagnosis and the specifics of her situation.  We first reviewed the fact that cancer is not one disease but more than 100 different diseases and that it is  important to keep them separate-- otherwise when friends and relatives discuss their own cancer experiences with Rita Davis confusion can result. Similarly we explained that if breast cancer spreads to the bone or liver, the patient would not have bone cancer or liver cancer, but breast cancer in the bone and breast cancer in the liver: one cancer in three places-- not 3 different cancers which otherwise would have to be treated in 3 different ways.  We discussed the difference between local and systemic therapy. In terms of loco-regional treatment, lumpectomy plus radiation is equivalent to mastectomy as far as survival is concerned.  We generally recommend lumpectomy therefore, however we will await the results of the patient's genetics testing before making  a final determination.    We also noted that in terms of sequencing of treatments, whether systemic therapy or surgery is done first does not affect the ultimate outcome.  That is relevant to this case since the patient would prefer to start with chemotherapy in hopes of saving as much of her breast as possible.  We then discussed the rationale for systemic therapy. There is some risk that this cancer may have already spread to other parts of her body. Patients frequently ask at this point about bone scans, CAT scans and PET scans to find out if they have occult breast cancer somewhere else. The problem is that in early stage disease we are much more likely to find false positives then true cancers and this would expose the patient to unnecessary procedures as well as unnecessary radiation. Scans cannot answer the question the patient really would like to know, which is whether she has microscopic disease elsewhere in her body. For those reasons we do not recommend them.  Of course we would proceed to aggressive evaluation of any symptoms that might suggest metastatic disease, but that is not the case here.  Next we went over the options for systemic therapy which are anti-estrogens, anti-HER-2 immunotherapy, and chemotherapy. Rita Davis would not obtain any significant benefit from antiestrogen therapy and is not a candidate for anti-HER2 treatment.  Accordingly her only option for systemic treatment is chemotherapy/immunotherapy..  We specifically recommended the carboplatin/Taxol/Keytruda regimen which is followed by Adriamycin and/Cytoxan/Keytruda.  This offers the best percentage of complete pathologic responses in patients like Rita Davis.  We discussed the possible toxicities side effects and complications of these agents and she will also come to meet with our chemotherapy teaching nurse before starting.  Rita Davis also qualifies for genetics testing and those labs were drawn today. In patients who carry a  deleterious mutation [for example in a  BRCA gene], the risk of a new breast cancer developing in the future may be sufficiently great that the patient may choose bilateral mastectomies. However if she wishes to keep her breasts in that situation it is safe to do so. That would require intensified screening, which generally means not only yearly mammography but a yearly breast MRI as well.   Rita Davis has a good understanding of the overall plan. She agrees with it. She knows the goal of treatment in her case is cure. She will call with any problems that may develop before her next visit here.  Total encounter time 65 minutes.Chauncey Cruel, MD   06/09/2021 4:35 PM Medical Oncology and Hematology [facility] [address] [place], [place] [address] Tel. [phone]    Fax. [phone]

## 2021-06-09 NOTE — Progress Notes (Signed)
REFERRING PROVIDER: Stark Klein, MD [address] [place],  [place] [address]  PRIMARY PROVIDER:  Drosinis, Pamalee Leyden, PA-C  PRIMARY REASON FOR VISIT:  1. Malignant neoplasm of upper-inner quadrant of right breast in female, estrogen receptor positive ([place])   2. Family history of breast cancer   3. Family history of prostate cancer   4. Family history of colon cancer      HISTORY OF PRESENT ILLNESS:   Ms. Tonkovich, a 60 y.o. female, was seen for a [place] cancer genetics consultation at the request of Dr. Barry Dienes due to a personal and family history of cancer.  Ms. Barbaro presents to clinic today to discuss the possibility of a hereditary predisposition to cancer, genetic testing, and to further clarify her future cancer risks, as well as potential cancer risks for family members.   In August of 2022, at the age of 17, Ms. Northrup was diagnosed with invasive ductal carcinoma of the right breast. The tumor is weakly ER positive, PR negative, and HER2 equivocal with pending FISH.  RISK FACTORS:  Menarche was at age 53.  First live birth at age 67.  OCP use for approximately 9 years.  Ovaries intact: yes.  Hysterectomy: yes.  Menopausal status: hysterectomy. HRT use: 0 years. Colonoscopy: yes;  01/2013 . Mammogram within the last year: yes. Any excessive radiation exposure in the past: no.   Past Medical History:  Diagnosis Date   Anxiety    Diabetes mellitus without complication ([place])    Family history of breast cancer    Family history of colon cancer    Family history of prostate cancer    Hyperlipidemia    Hypertension     Past Surgical History:  Procedure Laterality Date   ABDOMINAL HYSTERECTOMY     still has ovaries   BREAST BIOPSY Right 05/23/2012   BREAST CYST EXCISION Right    cyst removed     from lower back   DILATION AND CURETTAGE OF UTERUS     ORIF ANKLE FRACTURE Left 11/13/2020   Procedure: OPEN TREATMENT OF LEFT TRIMALLEOLAR ANKLE  FRACTURE WITH POSTERIOR FIXATION, SYNDESMOSIS;  Surgeon: Erle Crocker, MD;  Location: [facility];  Service: Orthopedics;  Laterality: Left;  LENGTH OF SURGERY: 1.5 HOURS    Social History   Socioeconomic History   Marital status: Married    Spouse name: Not on file   Number of children: Not on file   Years of education: Not on file   Highest education level: Not on file  Occupational History   Not on file  Tobacco Use   Smoking status: Never   Smokeless tobacco: Never  Substance and Sexual Activity   Alcohol use: No   Drug use: No   Sexual activity: Not on file  Other Topics Concern   Not on file  Social History Narrative   Not on file   Social Determinants of Health   Financial Resource Strain: Not on file  Food Insecurity: Not on file  Transportation Needs: Not on file  Physical Activity: Not on file  Stress: Not on file  Social Connections: Not on file     FAMILY HISTORY:  We obtained a detailed, 4-generation family history.  Significant diagnoses are listed below: Family History  Problem Relation Age of Onset   Hypertension Mother    Breast cancer Mother 49       declined treatment   Prostate cancer Father  metastatic, dx 69s   Colon cancer Brother 14   Cancer Maternal Great-grandmother 19       gynecologic cancer (MGM's mother)   Ms. Batta has two sons (ages 54 and 26). She had one brother who died from colon cancer at age 19. She also has a paternal half-brother but does not have any information about him.  Ms. Evola mother is alive at age 42 without cancer. There was one maternal aunt and one maternal uncle. There is no known cancer among maternal aunts/uncles or maternal cousins. Ms. Meares maternal grandmother died at age 27 without cancer. Her maternal grandfather died at age 48 without cancer. Her maternal great-grandmother (MGM's mother) died at age 60 with an unknown gynecologic cancer (patient things cervical  cancer).  Ms. Asquith father died at age 68 from metastatic prostate cancer (first diagnosed in his 108s). There were eight or nine paternal aunts/uncles, although Ms. Duhamel does not have much information about these relatives. She does not have any information about her paternal grandparents.  Ms. Raus is unaware of previous family history of genetic testing for hereditary cancer risks. Patient's maternal ancestors are of Black/African American, Cherokee Native American, and White/Caucasian descent, and paternal ancestors are of Black/African American descent. There is no reported Ashkenazi Jewish ancestry. There is no known consanguinity.  GENETIC COUNSELING ASSESSMENT: Ms. Semidey is a 60 y.o. female with a personal and family history of cancer which is somewhat suggestive of a hereditary cancer syndrome and predisposition to cancer. We, therefore, discussed and recommended the following at today's visit.   DISCUSSION: We discussed that approximately 5-10% of breast cancer is hereditary, with most cases associated with the BRCA1 and BRCA2 genes. There are other genes that can be associated with hereditary breast, colon and/or prostate cancer syndromes. These include ATM, CHEK2, PALB2, the Lynch syndrome genes, etc. We discussed that testing is beneficial for several reasons, including knowing about other cancer risks, identifying potential screening and risk-reduction options that may be appropriate, and to understand if other family members could be at risk for cancer and allow them to undergo genetic testing.  We reviewed the characteristics, features and inheritance patterns of hereditary cancer syndromes. We also discussed genetic testing, including the appropriate family members to test, the process of testing, insurance coverage and turn-around-time for results. We discussed the implications of a negative, positive and/or variant of uncertain significant result. We recommended Ms.  Heyne pursue genetic testing for the [facility] CustomNext-Cancer + RNAinsight panel.   The CustomNext-Cancer+RNAinsight panel offered by [facility] includes sequencing and rearrangement analysis for the following 47 genes:  APC, ATM, AXIN2, BARD1, BMPR1A, BRCA1, BRCA2, BRIP1, CDH1, CDK4, CDKN2A, CHEK2, DICER1, EPCAM, GREM1, HOXB13, MEN1, MLH1, MSH2, MSH3, MSH6, MUTYH, NBN, NF1, NF2, NTHL1, PALB2, PMS2, POLD1, POLE, PTEN, RAD51C, RAD51D, RECQL, RET, SDHA, SDHAF2, SDHB, SDHC, SDHD, SMAD4, SMARCA4, STK11, TP53, TSC1, TSC2, and VHL.  RNA data is routinely analyzed for use in variant interpretation for all genes.  Based on Ms. Buzan's personal and family history of cancer, she meets medical criteria for genetic testing. Despite that she meets criteria, there may still be an out of pocket cost.   PLAN: After considering the risks, benefits, and limitations, Ms. Mendell provided informed consent to pursue genetic testing and the blood sample was sent to [facility] for analysis of the CustomNext-Cancer + RNAinsight panel. Results should be available within approximately two-three weeks' time, at which point they will be disclosed by telephone to Ms. Rasmusson, as will any additional  recommendations warranted by these results. Ms. Mccabe will receive a summary of her genetic counseling visit and a copy of her results once available. This information will also be available in Epic.   Ms. Gallion questions were answered to her satisfaction today. Our contact information was provided should additional questions or concerns arise. Thank you for the referral and allowing [place] to share in the care of your patient.   Clint Guy, [place], [facility] Licensed, Certified Dispensing [email or web address] Phone: [phone]  The patient was seen for a total of 20 minutes in face-to-face genetic counseling. Patient was seen alone. This patient was discussed with Drs. Magrinat, Lindi Adie and/or Burr Medico who  agrees with the above.    _______________________________________________________________________ For Office Staff:  Number of people involved in session: 1 Was an Intern/ student involved with case: no

## 2021-06-10 ENCOUNTER — Encounter: Payer: Self-pay | Admitting: *Deleted

## 2021-06-10 ENCOUNTER — Telehealth: Payer: Self-pay | Admitting: *Deleted

## 2021-06-10 ENCOUNTER — Other Ambulatory Visit: Payer: Self-pay | Admitting: *Deleted

## 2021-06-10 DIAGNOSIS — C50211 Malignant neoplasm of upper-inner quadrant of right female breast: Secondary | ICD-10-CM

## 2021-06-10 DIAGNOSIS — Z170 Estrogen receptor positive status [ER+]: Secondary | ICD-10-CM

## 2021-06-10 NOTE — Telephone Encounter (Signed)
Left message on identified voicemail with appts for echo and chemo class for 8/18

## 2021-06-11 ENCOUNTER — Encounter: Payer: Self-pay | Admitting: *Deleted

## 2021-06-11 ENCOUNTER — Telehealth: Payer: Self-pay | Admitting: Oncology

## 2021-06-11 NOTE — Telephone Encounter (Signed)
Scheduled appts per 8/9 los. Spoke to pt who was confused about how the appts were being scheduled. She said she had been hearing different things about her port being placed and what days. She said she was frustrated and felt like she wasn't being given clear instructions. I transferred her to [place] to address her concerns.

## 2021-06-12 ENCOUNTER — Ambulatory Visit
Admission: RE | Admit: 2021-06-12 | Discharge: 2021-06-12 | Disposition: A | Discharge status: Home / Self Care | Payer: BC Managed Care – PPO | Source: Ambulatory Visit | Attending: Hematology and Oncology | Admitting: Hematology and Oncology

## 2021-06-12 ENCOUNTER — Other Ambulatory Visit: Payer: Self-pay

## 2021-06-12 DIAGNOSIS — C50211 Malignant neoplasm of upper-inner quadrant of right female breast: Secondary | ICD-10-CM

## 2021-06-12 DIAGNOSIS — Z170 Estrogen receptor positive status [ER+]: Secondary | ICD-10-CM

## 2021-06-12 IMAGING — MR MR BREAST BILAT WO/W CM
8 of 12 series · 32 of 48 positions shown · IV contrast (gadavist)
Comparison: No prior MRI available for comparison. Correlation made
with prior mammograms and ultrasound images.

CLINICAL DATA: 60-year-old female with biopsy proven grade 3
invasive ductal carcinoma of the right breast at 1 o'clock (coil
clip). She has a ribbon clip in the lower-inner quadrant of the
right breast from a biopsy of a benign fibroadenoma from [YF]. She
presents for MRI for initial staging. She has family history of
breast cancer in her mother at age 82.

LABS:  Creatinine of 1.24 mg per dL and GFR of 50 on [DATE].
EXAM:
BILATERAL BREAST MRI WITH AND WITHOUT CONTRAST
TECHNIQUE: Multiplanar, multisequence MR images of both breasts were obtained
prior to and following the intravenous administration of 9 ml of
Gadavist

[Series 2: t2_tirm_tra ipat (a-p) · axial · 3.0mm · 0.70mm/px · 1 of 55 slices shown]
[im 1/55]
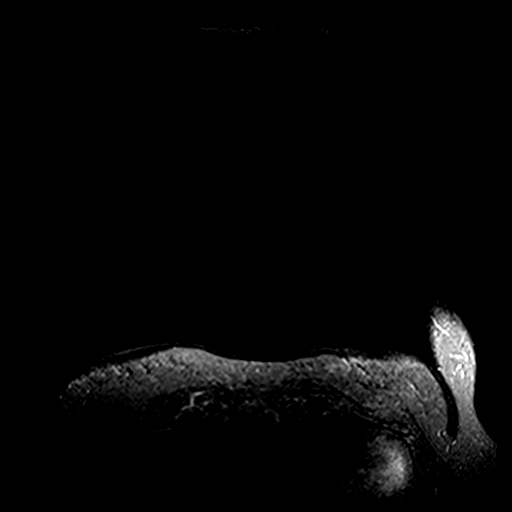

[Series 3: fl3d pre-cm no · axial · non-contrast · 1.2mm · 0.94mm/px · z∈[-42,+129]mm · 5 of 144 slices shown]
[im 1/144]
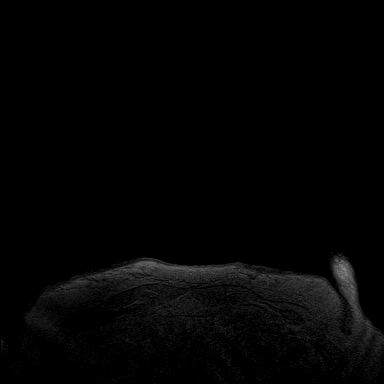
[im 36/144]
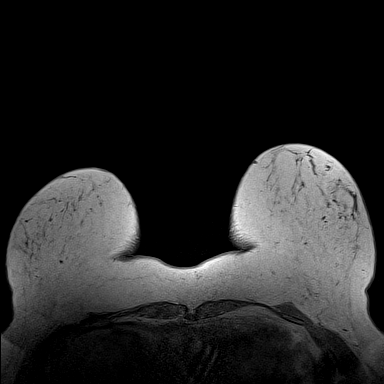
[im 72/144]
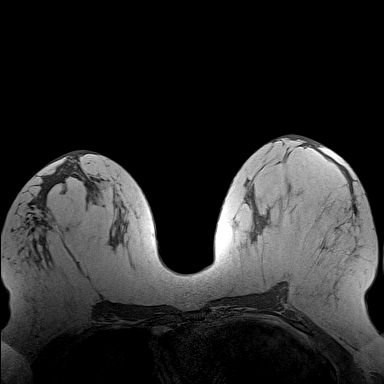
[im 108/144]
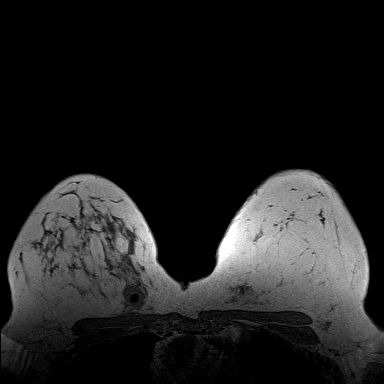
[im 144/144]
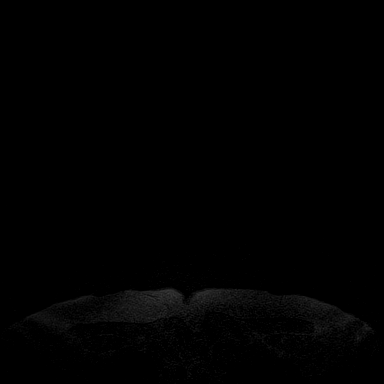

[Series 4: fl3d pre-cm · axial · non-contrast · 1.2mm · 0.99mm/px · z∈[-42,+129]mm · 5 of 144 slices shown]
[im 1/144]
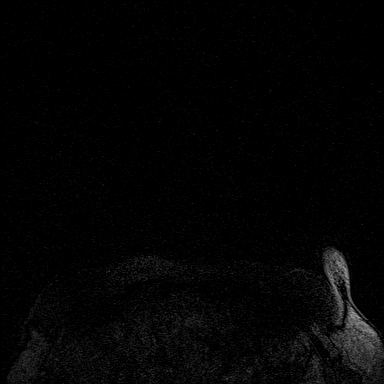
[im 36/144]
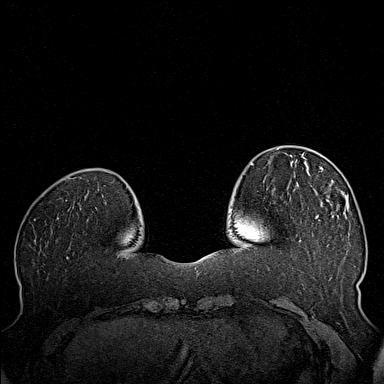
[im 72/144]
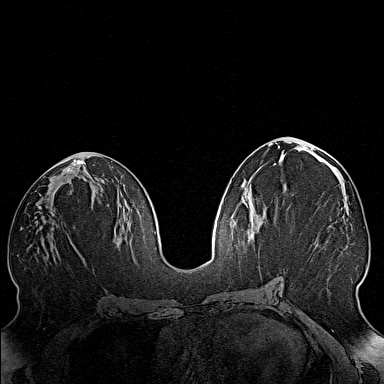
[im 108/144]
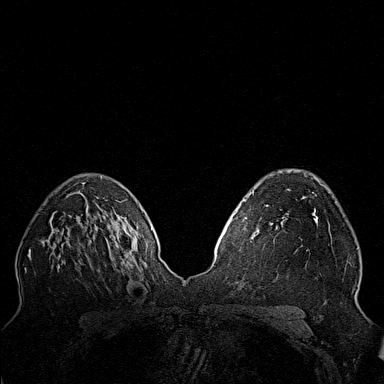
[im 144/144]
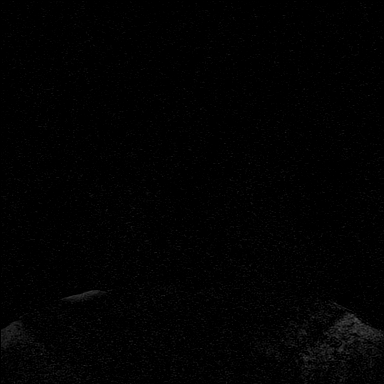

[Series 5: fl3d post-cm 20 · axial · 1.2mm · 0.99mm/px · z∈[-42,+129]mm · 5 of 144 slices shown (1 of 3)]
[im 1/144]
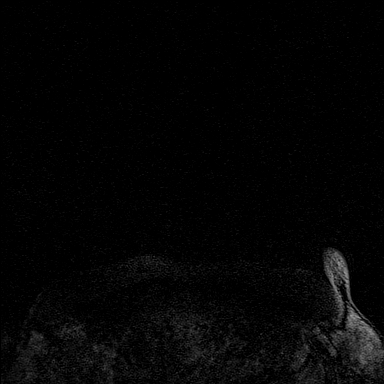
[im 36/144]
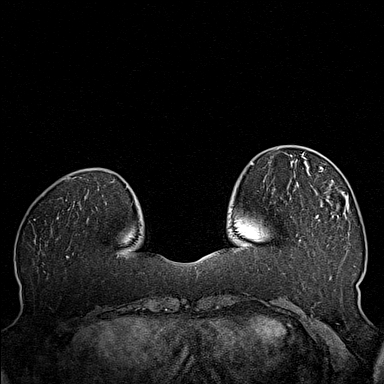
[im 72/144]
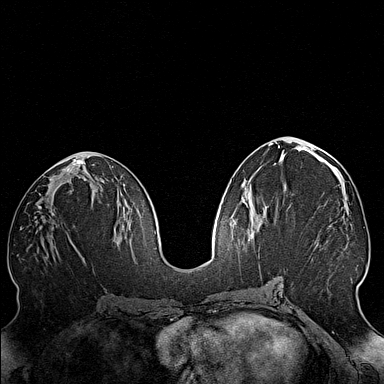
[im 108/144]
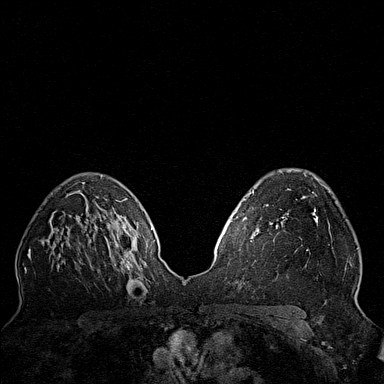
[im 144/144]
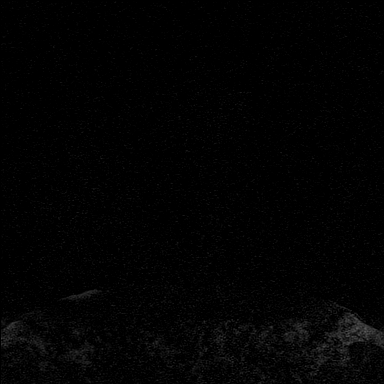

[Series 6: fl3d post-cm 20 · axial · 1.2mm · 0.99mm/px · z∈[-42,+129]mm · 5 of 144 slices shown (2 of 3)]
[im 1/144]
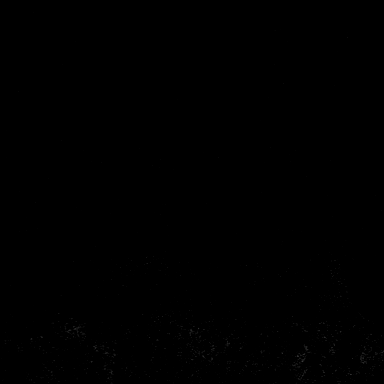
[im 36/144]
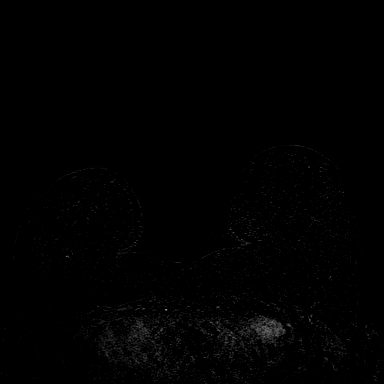
[im 72/144]
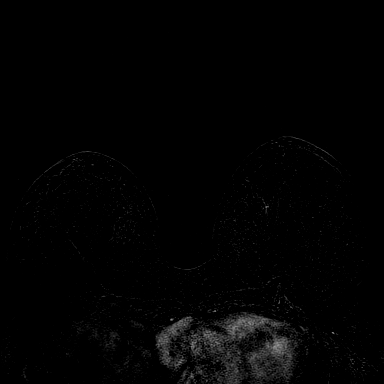
[im 108/144]
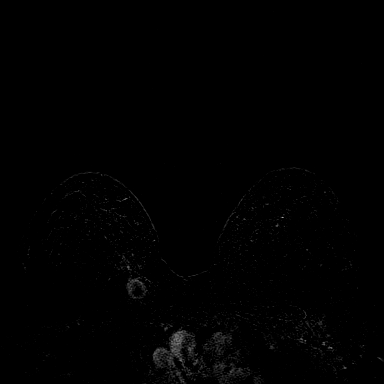
[im 144/144]
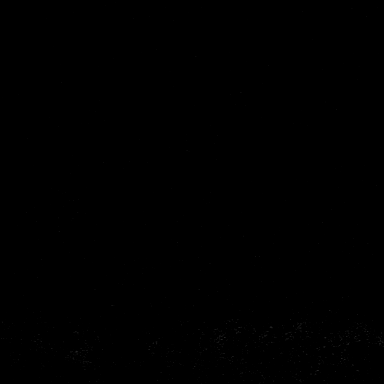

[Series 7: fl3d post-cm 20 · axial · 172.8mm · 0.99mm/px · 1 of 1 slices shown (3 of 3)]
[im 1/1]
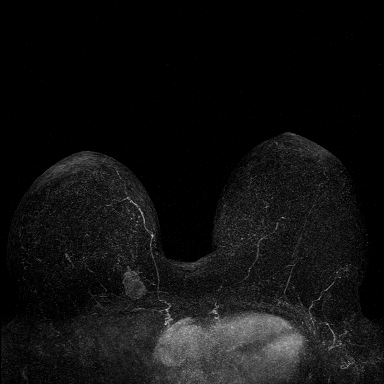

[Series 8: fl3d post-cm 3min · axial · 1.2mm · 0.99mm/px · z∈[-42,+129]mm · 6 of 144 slices shown]
[im 1/144]
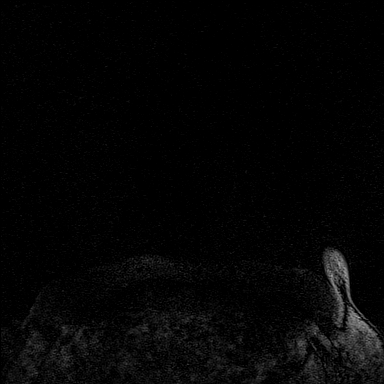
[im 29/144]
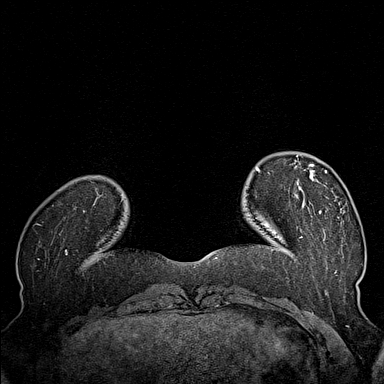
[im 58/144]
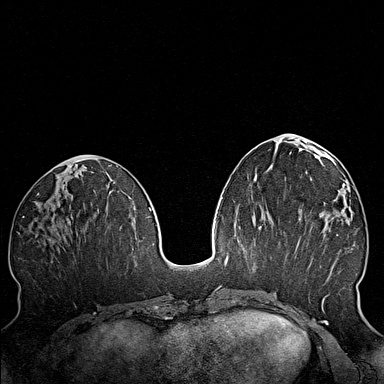
[im 86/144]
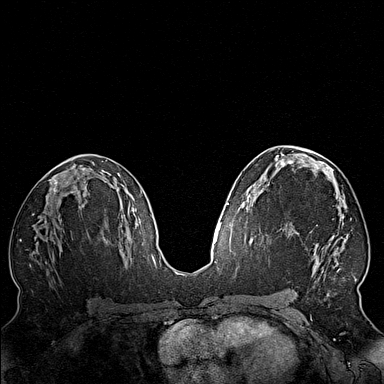
[im 115/144]
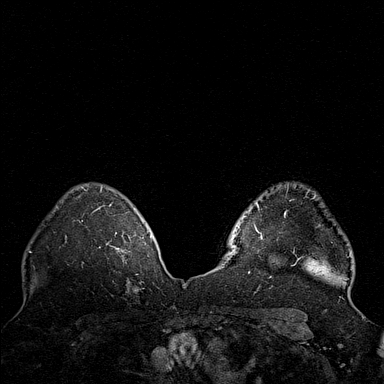
[im 144/144]
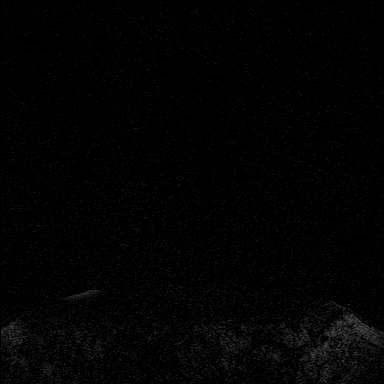

[Series 9: fl3d post-cm 3min_sub · axial · 1.2mm · 0.99mm/px · z∈[-42,+60]mm · 4 of 144 slices shown]
[im 1/144]
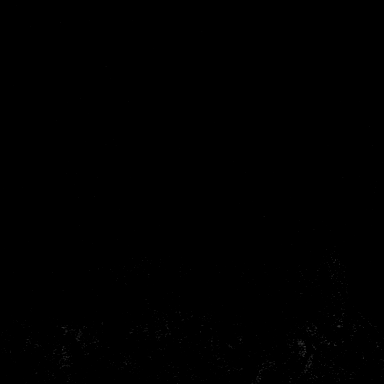
[im 29/144]
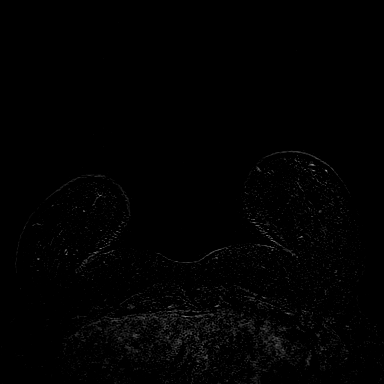
[im 58/144]
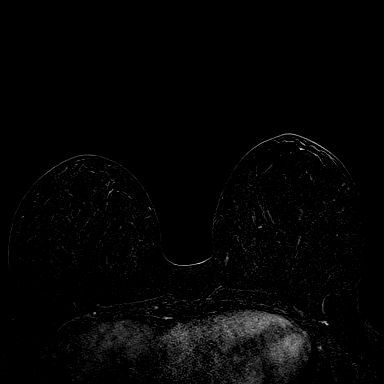
[im 86/144]
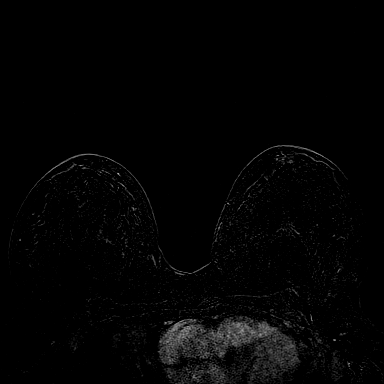

[32 of 48 positions shown; findings below may reference images not displayed]

Three-dimensional MR images were rendered by post-processing of the
original MR data on an independent workstation. The
three-dimensional MR images were interpreted, and findings are
reported in the following complete MRI report for this study. Three
dimensional images were evaluated at the independent interpreting
workstation using the DynaCAD thin client.
FINDINGS: Breast composition: c. Heterogeneous fibroglandular tissue.

Background parenchymal enhancement: Minimal

Right breast: The biopsy-proven cancer in the upper inner posterior
right breast is identified, with susceptibility artifact centrally
located corresponding with the biopsy marking clip. The mass itself
measures approximately 3.3 cm by MRI (2.9 cm with ultrasound). There
is some additional non mass enhancement extending anteriorly from
the mass, all together measuring 5.0 cm (series 12, image 37). No
additional suspicious areas of enhancement are found in the right
breast.

Left breast: No mass or abnormal enhancement.

Lymph nodes: No abnormal appearing lymph nodes.

Ancillary findings:  None.
IMPRESSION: 1. The biopsied proven malignancy in the upper inner right breast
measures 3.3 cm. There is indeterminate non mass enhancement
extending anteriorly from the mass, which all together spans
approximately 5.0 cm.

2.  No MRI evidence of left breast malignancy.

RECOMMENDATION:
1. If it will change clinical management, MRI guided biopsy of the
anterior margin of the non mass enhancement in the right breast
could be performed. Otherwise, continue treatment plan.

BI-RADS CATEGORY  6: Known biopsy-proven malignancy.

## 2021-06-12 MED ORDER — GADOBUTROL 1 MMOL/ML IV SOLN
9.0000 mL | Freq: Once | INTRAVENOUS | Status: AC | PRN
  Administered 2021-06-12: 9 mL via INTRAVENOUS

## 2021-06-13 ENCOUNTER — Encounter: Payer: Self-pay | Admitting: Oncology

## 2021-06-15 ENCOUNTER — Encounter: Payer: Self-pay | Admitting: Oncology

## 2021-06-15 ENCOUNTER — Encounter: Payer: Self-pay | Admitting: *Deleted

## 2021-06-16 ENCOUNTER — Other Ambulatory Visit: Payer: Self-pay

## 2021-06-16 ENCOUNTER — Encounter (HOSPITAL_COMMUNITY): Payer: Self-pay | Admitting: General Surgery

## 2021-06-16 NOTE — Progress Notes (Signed)
Spoke with pt for pre-op call. Pt denies cardiac history, but states she is treated for HTN. Pt is a pre-diabetic. Last A1C was 6.0 on 02/10/21. Pt does not check her blood sugar at home.  Pt's surgery is scheduled as ambulatory so no Covid test is required prior to surgery.

## 2021-06-17 ENCOUNTER — Encounter: Payer: Self-pay | Admitting: *Deleted

## 2021-06-17 NOTE — Progress Notes (Signed)
Pharmacist Chemotherapy Monitoring - Initial Assessment    Anticipated start date: 06/24/21   The following has been reviewed per standard work regarding the patient's treatment regimen: The patient's diagnosis, treatment plan and drug doses, and organ/hematologic function Lab orders and baseline tests specific to treatment regimen  The treatment plan start date, drug sequencing, and pre-medications Prior authorization status  Patient's documented medication list, including drug-drug interaction screen and prescriptions for anti-emetics and supportive care specific to the treatment regimen The drug concentrations, fluid compatibility, administration routes, and timing of the medications to be used The patient's access for treatment and lifetime cumulative dose history, if applicable  The patient's medication allergies and previous infusion related reactions, if applicable   Changes made to treatment plan: None  Follow up needed:  Pending authorization for treatment    Acquanetta Belling, [facility], 06/17/2021  2:07 PM

## 2021-06-17 NOTE — Progress Notes (Signed)
Anesthesia Chart Review: Rita Davis  Case: [id number] Date/Time: 06/18/21 1530   Procedure: PORT PLACEMENT   Anesthesia type: General   Pre-op diagnosis: RIGHT BREAST CANCER   Location: [facility] OR ROOM 09 / [place] OR   Surgeons: Stark Klein, MD       DISCUSSION: Patient is a 60 year old female scheduled for the above procedure. By oncology notes, patient recently diagnosed with right breast cancer, "clinical T2N0 invasive ductal carcinoma, grade 3, functionally triple negative, with an MIB-1 of 95%." Neoadjuvant chemotherapy is planned ("carboplatin paclitaxel pembrolizumab for 4 cycles starting 06/23/2021 followed by doxorubicin and cyclophosphamide pembrolizumab for 4 cycles").   History includes never smoker, HLD, HTN, pre-diabetes, hysterectomy (for fibroids, anemia), right breast cancer (06/03/21 right breast biopsy: invasive ductal carcinoma). S/p ORIF left trimalleolar ankle fracture with posterior fixation 11/13/20.   She has a pre-chemo echocardiogram scheduled at [facility] for 06/18/21 at 9:50 AM, and is then scheduled to come to [facility] for her Port placement scheduled for 3:45 PM.   She tolerated orthopedic surgery earlier this year. She is a same day work-up. Anesthesia team to evaluate on the day of surgery. She had a CBC with diff and CMET on 06/09/21.     VS:  BP Readings from Last 3 Encounters:  06/09/21 125/77  11/13/20 133/78  06/20/18 127/70   Pulse Readings from Last 3 Encounters:  06/09/21 73  11/13/20 89  06/20/18 73     PROVIDERS: Jenel Lucks, PA-C is PCP  Magrinat, Sarajane Jews, MD is HEM-ONC   LABS: Lab results as of 06/09/21 include:  Lab Results  Component Value Date   WBC 7.2 06/09/2021   HGB 12.5 06/09/2021   HCT 37.6 06/09/2021   PLT 247 06/09/2021   GLUCOSE 82 06/09/2021   ALT 24 06/09/2021   AST 21 06/09/2021   NA 139 06/09/2021   K 4.3 06/09/2021   CL 104 06/09/2021   CREATININE 1.24 (H) 06/09/2021   BUN 18 06/09/2021   CO2 26 06/09/2021  A1c  6.0% 02/10/21 (Care Everywhere).    IMAGES: [place] Retroperitoneal (limited) 02/18/21 ([facility]): IMPRESSION:  1. Negative for obstructive uropathy.  2. The echogenicity of the liver is increased. This is a nonspecific  finding but is most commonly seen with fatty infiltration of the  liver.    EKG: 11/10/20: Normal sinus rhythm Pulmonary disease pattern Left anterior fascicular block Nonspecific T wave abnormality Abnormal ECG No old tracing to compare Confirmed by [place], Peter [id number]) on 11/10/2020 2:50:07 PM   CV: Pre-chemo echo is scheduled for 06/18/21.   Past Medical History:  Diagnosis Date   Anemia    had a fibroid tumor, was anemic at that time   Anxiety    Cancer [facility])    breast cancer   Family history of breast cancer    Family history of colon cancer    Family history of prostate cancer    Hyperlipidemia    Hypertension    Pneumonia    as a child   Pre-diabetes     Past Surgical History:  Procedure Laterality Date   ABDOMINAL HYSTERECTOMY     still has ovaries   BREAST BIOPSY Right 05/23/2012   BREAST CYST EXCISION Right    cyst removed     from lower back   [place]     ORIF ANKLE FRACTURE Left 11/13/2020   Procedure: OPEN TREATMENT OF LEFT TRIMALLEOLAR ANKLE FRACTURE WITH POSTERIOR FIXATION, SYNDESMOSIS;  Surgeon: Melony Overly  R, MD;  Location: [facility];  Service: Orthopedics;  Laterality: Left;  LENGTH OF SURGERY: 1.5 HOURS    MEDICATIONS: No current facility-administered medications for this encounter.    amLODipine-olmesartan (AZOR) 5-40 MG tablet   betamethasone valerate (VALISONE) 0.1 % cream   bimatoprost (LATISSE) 0.03 % ophthalmic solution   Calcium-Magnesium-Zinc (CAL-MAG-ZINC PO)   Cholecalciferol (VITAMIN D3) 250 MCG (10000 UT) TABS   escitalopram (LEXAPRO) 10 MG tablet   Misc Natural Products (SAMBUCUS ELDERBERRY IMMUNE PO)   Misc Natural Products (SUPER GREENS) POWD   pravastatin  (PRAVACHOL) 20 MG tablet   aspirin (BAYER ASPIRIN) 325 MG tablet   dexamethasone (DECADRON) 4 MG tablet   lidocaine-prilocaine (EMLA) cream   prochlorperazine (COMPAZINE) 10 MG tablet  She is not yet on dexamethasone, prochlorperazine, or EMLA cream (will be associated with chemotherapy treatments). She is no longer taking ASA.   Myra Gianotti, PA-C Surgical Short Stay/Anesthesiology [facility] Phone [phone] [facility] Phone [phone] 06/17/2021 10:53 AM

## 2021-06-17 NOTE — Anesthesia Preprocedure Evaluation (Addendum)
Anesthesia Evaluation  Patient identified by MRN, date of birth, ID band Patient awake    Reviewed: Allergy & Precautions, NPO status , Patient's Chart, lab work & pertinent test results  Airway Mallampati: II  TM Distance: >3 FB Neck ROM: Full    Dental no notable dental hx. (+) Teeth Intact, Dental Advisory Given   Pulmonary    Pulmonary exam normal breath sounds clear to auscultation       Cardiovascular hypertension, Pt. on medications Normal cardiovascular exam Rhythm:Regular Rate:Normal     Neuro/Psych PSYCHIATRIC DISORDERS Anxiety    GI/Hepatic negative GI ROS, Neg liver ROS,   Endo/Other  Hyperlipidemia obesity  Renal/GU negative Renal ROS     Musculoskeletal   Abdominal   Peds  Hematology  (+) anemia ,   Anesthesia Other Findings Breast cancer  Reproductive/Obstetrics                          Anesthesia Physical Anesthesia Plan  ASA: 3  Anesthesia Plan: General   Post-op Pain Management:    Induction: Intravenous  PONV Risk Score and Plan:   Airway Management Planned: LMA  Additional Equipment: None  Intra-op Plan:   Post-operative Plan: Extubation in OR  Informed Consent: I have reviewed the patients History and Physical, chart, labs and discussed the procedure including the risks, benefits and alternatives for the proposed anesthesia with the patient or authorized representative who has indicated his/her understanding and acceptance.     Dental advisory given  Plan Discussed with: CRNA, Anesthesiologist and Surgeon  Anesthesia Plan Comments: (See PAT note written 06/17/2021 by Myra Gianotti, PA-C.  )       Anesthesia Quick Evaluation

## 2021-06-18 ENCOUNTER — Other Ambulatory Visit: Payer: Self-pay

## 2021-06-18 ENCOUNTER — Inpatient Hospital Stay: Payer: BC Managed Care – PPO

## 2021-06-18 ENCOUNTER — Encounter: Payer: Self-pay | Admitting: Oncology

## 2021-06-18 ENCOUNTER — Ambulatory Visit (HOSPITAL_COMMUNITY): Payer: BC Managed Care – PPO

## 2021-06-18 ENCOUNTER — Ambulatory Visit (HOSPITAL_COMMUNITY): Payer: BC Managed Care – PPO | Admitting: Vascular Surgery

## 2021-06-18 ENCOUNTER — Ambulatory Visit (HOSPITAL_COMMUNITY)
Admission: RE | Admit: 2021-06-18 | Discharge: 2021-06-18 | Disposition: A | Discharge status: Home / Self Care | Payer: BC Managed Care – PPO | Attending: General Surgery | Admitting: General Surgery

## 2021-06-18 ENCOUNTER — Encounter (HOSPITAL_COMMUNITY)
Admission: RE | Disposition: A | Discharge status: Home / Self Care | Payer: Self-pay | Source: Home / Self Care | Attending: General Surgery

## 2021-06-18 ENCOUNTER — Encounter (HOSPITAL_COMMUNITY): Payer: Self-pay | Admitting: General Surgery

## 2021-06-18 ENCOUNTER — Ambulatory Visit (HOSPITAL_BASED_OUTPATIENT_CLINIC_OR_DEPARTMENT_OTHER)
Admission: RE | Admit: 2021-06-18 | Discharge: 2021-06-18 | Disposition: A | Discharge status: Home / Self Care | Payer: BC Managed Care – PPO | Source: Ambulatory Visit | Attending: Oncology | Admitting: Oncology

## 2021-06-18 ENCOUNTER — Other Ambulatory Visit: Payer: Self-pay | Admitting: Oncology

## 2021-06-18 DIAGNOSIS — C50211 Malignant neoplasm of upper-inner quadrant of right female breast: Secondary | ICD-10-CM | POA: Insufficient documentation

## 2021-06-18 DIAGNOSIS — Z95828 Presence of other vascular implants and grafts: Secondary | ICD-10-CM

## 2021-06-18 DIAGNOSIS — Z0189 Encounter for other specified special examinations: Secondary | ICD-10-CM | POA: Diagnosis not present

## 2021-06-18 DIAGNOSIS — Z170 Estrogen receptor positive status [ER+]: Secondary | ICD-10-CM

## 2021-06-18 DIAGNOSIS — Z803 Family history of malignant neoplasm of breast: Secondary | ICD-10-CM | POA: Diagnosis not present

## 2021-06-18 DIAGNOSIS — E785 Hyperlipidemia, unspecified: Secondary | ICD-10-CM | POA: Insufficient documentation

## 2021-06-18 DIAGNOSIS — I10 Essential (primary) hypertension: Secondary | ICD-10-CM | POA: Insufficient documentation

## 2021-06-18 DIAGNOSIS — I700 Atherosclerosis of aorta: Secondary | ICD-10-CM | POA: Insufficient documentation

## 2021-06-18 DIAGNOSIS — Z419 Encounter for procedure for purposes other than remedying health state, unspecified: Secondary | ICD-10-CM

## 2021-06-18 DIAGNOSIS — Z79899 Other long term (current) drug therapy: Secondary | ICD-10-CM | POA: Diagnosis not present

## 2021-06-18 DIAGNOSIS — Z800 Family history of malignant neoplasm of digestive organs: Secondary | ICD-10-CM | POA: Insufficient documentation

## 2021-06-18 HISTORY — PX: PORTACATH PLACEMENT: SHX2246

## 2021-06-18 HISTORY — DX: Anemia, unspecified: D64.9

## 2021-06-18 HISTORY — DX: Malignant (primary) neoplasm, unspecified: C80.1

## 2021-06-18 HISTORY — DX: Prediabetes: R73.03

## 2021-06-18 HISTORY — DX: Pneumonia, unspecified organism: J18.9

## 2021-06-18 LAB — ECHOCARDIOGRAM COMPLETE
Area-P 1/2: 2.91 cm2
S' Lateral: 2.7 cm

## 2021-06-18 LAB — GLUCOSE, CAPILLARY: Glucose-Capillary: 101 mg/dL — ABNORMAL HIGH (ref 70–99)

## 2021-06-18 IMAGING — DX DG CHEST 1V PORT
1 series · 1 of 1 positions shown · non-contrast
Comparison: None.

CLINICAL DATA: Port-A-Cath placement

EXAM:
PORTABLE CHEST 1 VIEW

[chest]
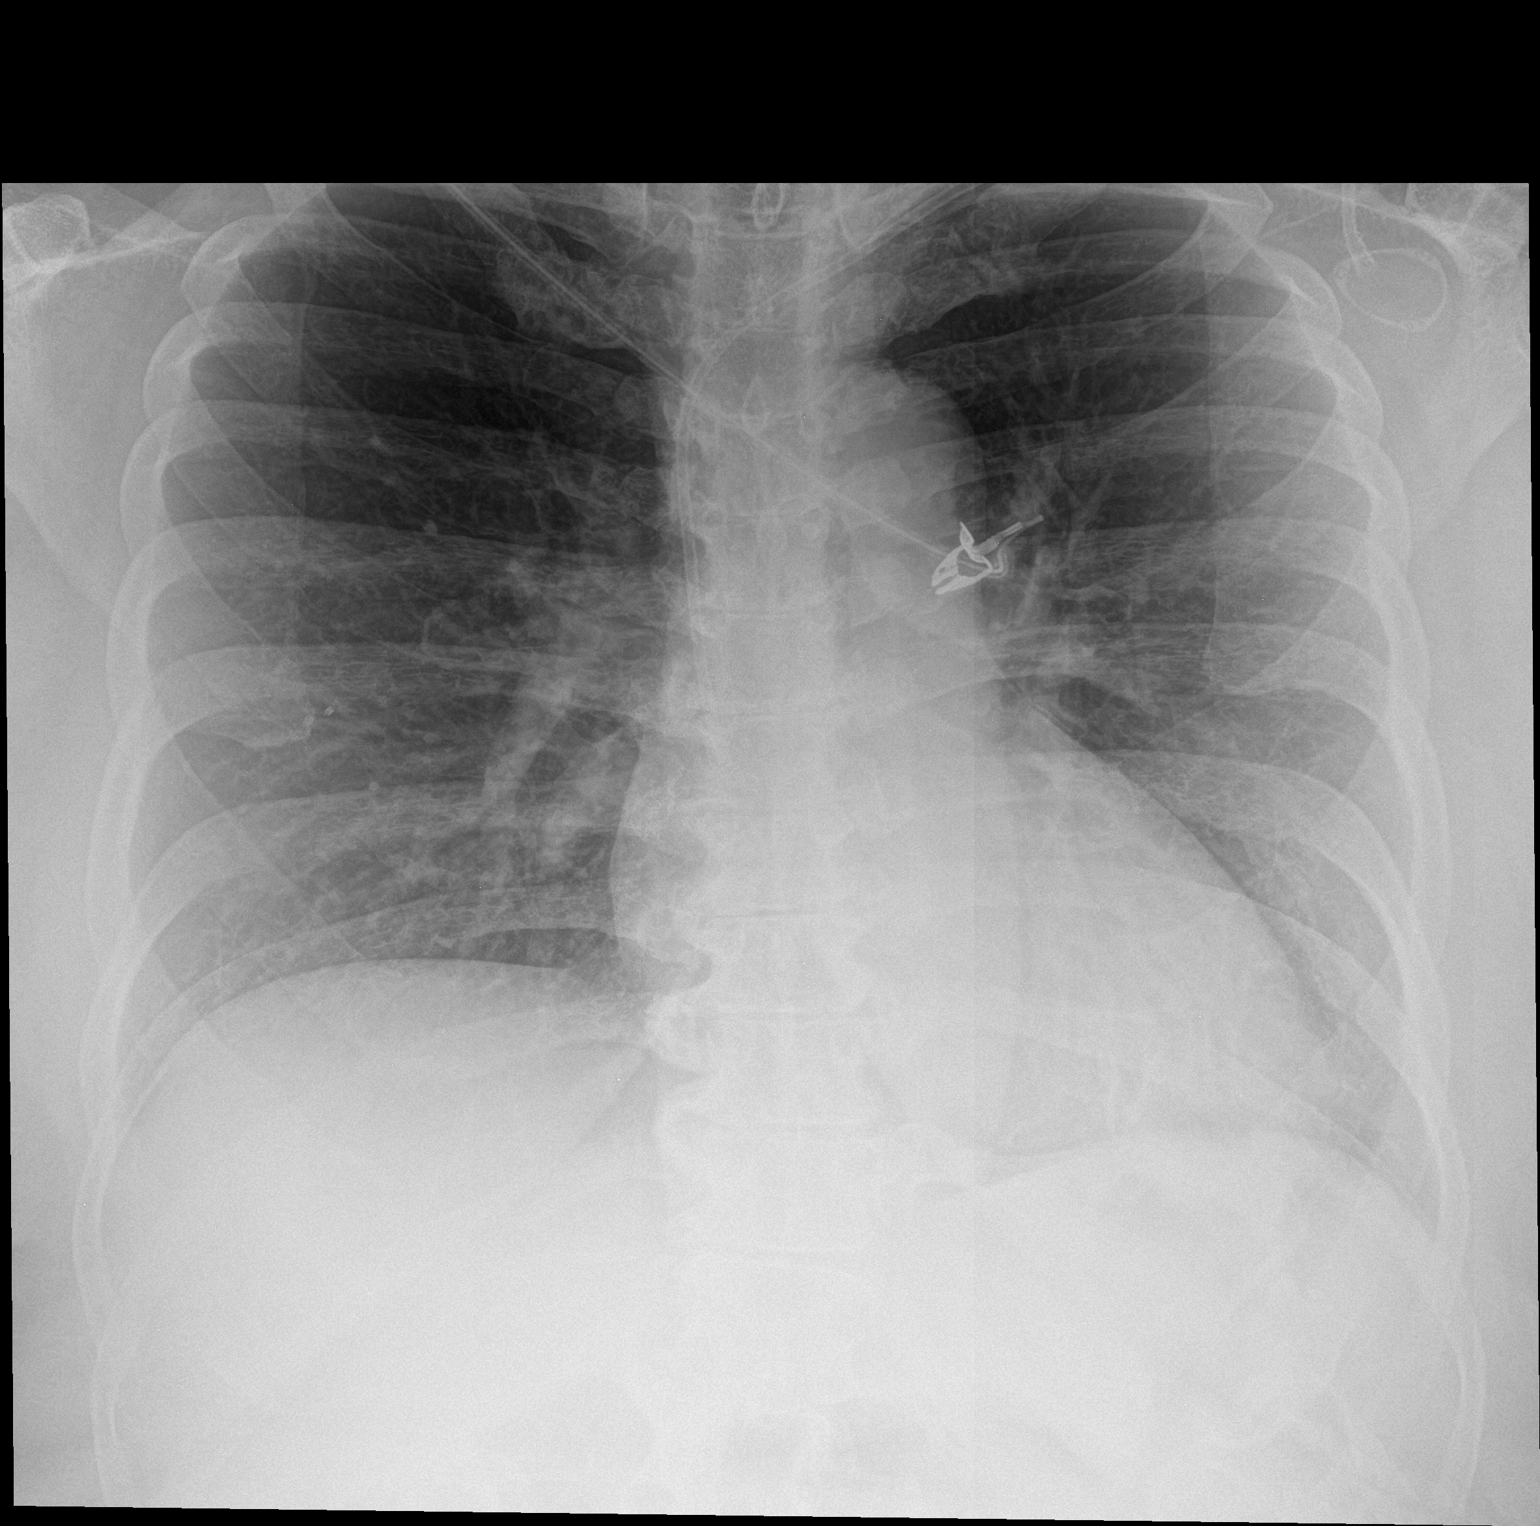

[1 of 1 positions shown; findings below may reference images not displayed]

FINDINGS: Interval placement of a left chest port with catheter tip overlying
the superior cavoatrial junction. The heart size and mediastinal
contours are within normal limits. No focal pulmonary opacity. No
pleural effusion or pneumothorax. The visualized skeletal structures
are unremarkable.
IMPRESSION: Status post placement of a left chest port.  Otherwise normal chest.

## 2021-06-18 SURGERY — INSERTION, TUNNELED CENTRAL VENOUS DEVICE, WITH PORT
Anesthesia: General | Site: Chest | Laterality: Left

## 2021-06-18 MED ORDER — CHLORHEXIDINE GLUCONATE 0.12 % MT SOLN
OROMUCOSAL | Status: AC
  Administered 2021-06-18: 15 mL via OROMUCOSAL
  Filled 2021-06-18: qty 15

## 2021-06-18 MED ORDER — ONDANSETRON HCL 4 MG/2ML IJ SOLN
INTRAMUSCULAR | Status: AC
  Filled 2021-06-18: qty 2

## 2021-06-18 MED ORDER — OXYCODONE HCL 5 MG PO TABS: 5.0000 mg | ORAL_TABLET | Freq: Once | ORAL | Status: DC | PRN

## 2021-06-18 MED ORDER — 0.9 % SODIUM CHLORIDE (POUR BTL) OPTIME
TOPICAL | Status: DC | PRN
  Administered 2021-06-18: 1000 mL

## 2021-06-18 MED ORDER — ACETAMINOPHEN 500 MG PO TABS
ORAL_TABLET | ORAL | Status: AC
  Administered 2021-06-18: 1000 mg via ORAL
  Filled 2021-06-18: qty 2

## 2021-06-18 MED ORDER — HEPARIN SOD (PORK) LOCK FLUSH 100 UNIT/ML IV SOLN
INTRAVENOUS | Status: AC
  Filled 2021-06-18: qty 5

## 2021-06-18 MED ORDER — FENTANYL CITRATE (PF) 250 MCG/5ML IJ SOLN
INTRAMUSCULAR | Status: DC | PRN
  Administered 2021-06-18 (×2): 50 ug via INTRAVENOUS

## 2021-06-18 MED ORDER — ONDANSETRON HCL 4 MG/2ML IJ SOLN
INTRAMUSCULAR | Status: DC | PRN
  Administered 2021-06-18: 4 mg via INTRAVENOUS

## 2021-06-18 MED ORDER — PROMETHAZINE HCL 25 MG/ML IJ SOLN: 6.2500 mg | INTRAMUSCULAR | Status: DC | PRN

## 2021-06-18 MED ORDER — CHLORHEXIDINE GLUCONATE 0.12 % MT SOLN: 15.0000 mL | Freq: Once | OROMUCOSAL | Status: AC

## 2021-06-18 MED ORDER — FENTANYL CITRATE (PF) 250 MCG/5ML IJ SOLN
INTRAMUSCULAR | Status: AC
  Filled 2021-06-18: qty 5

## 2021-06-18 MED ORDER — ORAL CARE MOUTH RINSE: 15.0000 mL | Freq: Once | OROMUCOSAL | Status: AC

## 2021-06-18 MED ORDER — OXYCODONE HCL 5 MG PO TABS: 5.0000 mg | ORAL_TABLET | Freq: Four times a day (QID) | ORAL | 0 refills | Status: DC | PRN

## 2021-06-18 MED ORDER — DEXAMETHASONE SODIUM PHOSPHATE 10 MG/ML IJ SOLN
INTRAMUSCULAR | Status: AC
  Filled 2021-06-18: qty 1

## 2021-06-18 MED ORDER — BUPIVACAINE-EPINEPHRINE (PF) 0.25% -1:200000 IJ SOLN
INTRAMUSCULAR | Status: AC
  Filled 2021-06-18: qty 30

## 2021-06-18 MED ORDER — HEPARIN SOD (PORK) LOCK FLUSH 100 UNIT/ML IV SOLN
INTRAVENOUS | Status: DC | PRN
  Administered 2021-06-18: 500 [IU] via INTRAVENOUS

## 2021-06-18 MED ORDER — FENTANYL CITRATE (PF) 100 MCG/2ML IJ SOLN: 25.0000 ug | INTRAMUSCULAR | Status: DC | PRN

## 2021-06-18 MED ORDER — CHLORHEXIDINE GLUCONATE CLOTH 2 % EX PADS: 6.0000 | MEDICATED_PAD | Freq: Once | CUTANEOUS | Status: DC

## 2021-06-18 MED ORDER — LIDOCAINE HCL 1 % IJ SOLN
INTRAMUSCULAR | Status: DC | PRN
  Administered 2021-06-18: 7 mL

## 2021-06-18 MED ORDER — DEXAMETHASONE SODIUM PHOSPHATE 10 MG/ML IJ SOLN
INTRAMUSCULAR | Status: DC | PRN
  Administered 2021-06-18: 10 mg via INTRAVENOUS

## 2021-06-18 MED ORDER — MIDAZOLAM HCL 5 MG/5ML IJ SOLN
INTRAMUSCULAR | Status: DC | PRN
  Administered 2021-06-18: 2 mg via INTRAVENOUS

## 2021-06-18 MED ORDER — LIDOCAINE 2% (20 MG/ML) 5 ML SYRINGE
INTRAMUSCULAR | Status: DC | PRN
  Administered 2021-06-18: 80 mg via INTRAVENOUS

## 2021-06-18 MED ORDER — HEPARIN 6000 UNIT IRRIGATION SOLUTION
Status: AC
  Filled 2021-06-18: qty 500

## 2021-06-18 MED ORDER — LACTATED RINGERS IV SOLN: INTRAVENOUS | Status: DC

## 2021-06-18 MED ORDER — OXYCODONE HCL 5 MG/5ML PO SOLN: 5.0000 mg | Freq: Once | ORAL | Status: DC | PRN

## 2021-06-18 MED ORDER — AMISULPRIDE (ANTIEMETIC) 5 MG/2ML IV SOLN: 10.0000 mg | Freq: Once | INTRAVENOUS | Status: DC | PRN

## 2021-06-18 MED ORDER — CEFAZOLIN SODIUM-DEXTROSE 2-4 GM/100ML-% IV SOLN
2.0000 g | INTRAVENOUS | Status: AC
  Administered 2021-06-18: 2 g via INTRAVENOUS

## 2021-06-18 MED ORDER — LIDOCAINE HCL 1 % IJ SOLN
INTRAMUSCULAR | Status: AC
  Filled 2021-06-18: qty 20

## 2021-06-18 MED ORDER — CEFAZOLIN SODIUM-DEXTROSE 2-4 GM/100ML-% IV SOLN
INTRAVENOUS | Status: AC
  Filled 2021-06-18: qty 100

## 2021-06-18 MED ORDER — EPHEDRINE SULFATE-NACL 50-0.9 MG/10ML-% IV SOSY
PREFILLED_SYRINGE | INTRAVENOUS | Status: DC | PRN
  Administered 2021-06-18: 5 mg via INTRAVENOUS
  Administered 2021-06-18: 10 mg via INTRAVENOUS

## 2021-06-18 MED ORDER — PROPOFOL 10 MG/ML IV BOLUS
INTRAVENOUS | Status: DC | PRN
  Administered 2021-06-18: 200 mg via INTRAVENOUS

## 2021-06-18 MED ORDER — HEPARIN 6000 UNIT IRRIGATION SOLUTION
Status: DC | PRN
  Administered 2021-06-18: 1

## 2021-06-18 MED ORDER — ACETAMINOPHEN 500 MG PO TABS: 1000.0000 mg | ORAL_TABLET | ORAL | Status: AC

## 2021-06-18 MED ORDER — MIDAZOLAM HCL 2 MG/2ML IJ SOLN
INTRAMUSCULAR | Status: AC
  Filled 2021-06-18: qty 2

## 2021-06-18 SURGICAL SUPPLY — 42 items
ADH SKN CLS APL DERMABOND .7 (GAUZE/BANDAGES/DRESSINGS) ×1
APL PRP STRL LF DISP 70% ISPRP (MISCELLANEOUS) ×1
BAG COUNTER SPONGE SURGICOUNT (BAG) ×2 IMPLANT
BAG DECANTER FOR FLEXI CONT (MISCELLANEOUS) ×2 IMPLANT
BAG SPNG CNTER NS LX DISP (BAG) ×1
CANISTER SUCT 3000ML PPV (MISCELLANEOUS) IMPLANT
CHLORAPREP W/TINT 26 (MISCELLANEOUS) ×2 IMPLANT
COVER SURGICAL LIGHT HANDLE (MISCELLANEOUS) ×2 IMPLANT
COVER TRANSDUCER ULTRASND GEL (DISPOSABLE) IMPLANT
DECANTER SPIKE VIAL GLASS SM (MISCELLANEOUS) ×4 IMPLANT
DERMABOND ADVANCED (GAUZE/BANDAGES/DRESSINGS) ×1
DERMABOND ADVANCED .7 DNX12 (GAUZE/BANDAGES/DRESSINGS) ×1 IMPLANT
DRAPE C-ARM 42X120 X-RAY (DRAPES) ×2 IMPLANT
DRAPE CHEST BREAST 15X10 FENES (DRAPES) ×2 IMPLANT
ELECT COATED BLADE 2.86 ST (ELECTRODE) ×2 IMPLANT
ELECT REM PT RETURN 9FT ADLT (ELECTROSURGICAL) ×2
ELECTRODE REM PT RTRN 9FT ADLT (ELECTROSURGICAL) ×1 IMPLANT
GAUZE 4X4 16PLY ~~LOC~~+RFID DBL (SPONGE) ×2 IMPLANT
GEL ULTRASOUND 20GR AQUASONIC (MISCELLANEOUS) IMPLANT
GLOVE SURG ENC MOIS LTX SZ6 (GLOVE) ×2 IMPLANT
GLOVE SURG UNDER LTX SZ6.5 (GLOVE) ×2 IMPLANT
GOWN STRL REUS W/ TWL LRG LVL3 (GOWN DISPOSABLE) ×1 IMPLANT
GOWN STRL REUS W/TWL 2XL LVL3 (GOWN DISPOSABLE) ×2 IMPLANT
GOWN STRL REUS W/TWL LRG LVL3 (GOWN DISPOSABLE) ×2
KIT BASIN OR (CUSTOM PROCEDURE TRAY) ×2 IMPLANT
KIT PORT POWER 8FR ISP CVUE (Port) ×1 IMPLANT
KIT TURNOVER KIT B (KITS) ×2 IMPLANT
NEEDLE 22X1 1/2 (OR ONLY) (NEEDLE) ×2 IMPLANT
NS IRRIG 1000ML POUR BTL (IV SOLUTION) ×2 IMPLANT
PAD ARMBOARD 7.5X6 YLW CONV (MISCELLANEOUS) ×2 IMPLANT
PENCIL BUTTON HOLSTER BLD 10FT (ELECTRODE) ×2 IMPLANT
POSITIONER HEAD DONUT 9IN (MISCELLANEOUS) ×2 IMPLANT
SUT MON AB 4-0 PC3 18 (SUTURE) ×2 IMPLANT
SUT PROLENE 2 0 SH DA (SUTURE) ×4 IMPLANT
SUT VIC AB 3-0 SH 27 (SUTURE) ×2
SUT VIC AB 3-0 SH 27X BRD (SUTURE) ×1 IMPLANT
SYR 5ML LUER SLIP (SYRINGE) ×2 IMPLANT
TOWEL GREEN STERILE (TOWEL DISPOSABLE) ×2 IMPLANT
TOWEL GREEN STERILE FF (TOWEL DISPOSABLE) ×2 IMPLANT
TRAY LAPAROSCOPIC MC (CUSTOM PROCEDURE TRAY) ×2 IMPLANT
TUBE CONNECTING 12X1/4 (SUCTIONS) IMPLANT
YANKAUER SUCT BULB TIP NO VENT (SUCTIONS) IMPLANT

## 2021-06-18 NOTE — Op Note (Signed)
PREOPERATIVE DIAGNOSIS:  Right breast cancer     POSTOPERATIVE DIAGNOSIS:  Same     PROCEDURE: Left subclavian port placement, Bard ClearVuePower Port, MRI safe, 8-French.      SURGEON:  Stark Klein, MD      ANESTHESIA:  General   FINDINGS:  Good venous return, easy flush, and tip of the catheter and   SVC 25 cm.      SPECIMEN:  None.      ESTIMATED BLOOD LOSS:  Minimal.      COMPLICATIONS:  None known.      PROCEDURE:  Pt was identified in the holding area and taken to   the operating room, where patient was placed supine on the operating room   table.  General anesthesia was induced.  Patient's arms were tucked and the upper   chest and neck were prepped and draped in sterile fashion.  Time-out was   performed according to the surgical safety check list.  When all was   correct, we continued.   Local anesthetic was administered over this   area at the angle of the clavicle.  The vein was accessed with 1 pass(es) of the needle. There was good venous return and the wire passed easily with no ectopy.   Fluoroscopy was used to confirm that the wire was in the vena cava.      The patient was placed back level and the area for the pocket was anethetized   with local anesthetic.  A 3-cm transverse incision was made with a #15   blade.  Cautery was used to divide the subcutaneous tissues down to the   pectoralis muscle.  An Army-Nav[employer] retractor was used to elevate the skin   while a pocket was created on top of the pectoralis fascia.  The port   was placed into the pocket to confirm that it was of adequate size.  The   catheter was preattached to the port.  The port was then secured to the   pectoralis fascia with four 2-0 Prolene sutures.  These were clamped and   not tied down yet.    The catheter was tunneled through to the wire exit   site.  The catheter was placed along the wire to determine what length it should be to be in the SVC.  The catheter was cut at 25 cm.  The tunneler  sheath and dilator were passed over the wire and the dilator and wire were removed.  The catheter was advanced through the tunneler sheath and the tunneler sheath was pulled away.  Care was taken to keep the catheter in the tunneler sheath as this occurred. This was advanced and the tunneler sheath was removed.  There was good venous   return and easy flush of the catheter.  The Prolene sutures were tied   down to the pectoral fascia.  The skin was reapproximated using 3-0   Vicryl interrupted deep dermal sutures.    Fluoroscopy was used to re-confirm good position of the catheter.  The skin   was then closed using 4-0 Monocryl in a subcuticular fashion.  The port was flushed with concentrated heparin flush as well.  The wounds were then cleaned, dried, and dressed with Dermabond.  The patient was awakened from anesthesia and taken to the PACU in stable condition.  Needle, sponge, and instrument counts were correct.               Stark Klein, MD

## 2021-06-18 NOTE — Anesthesia Procedure Notes (Signed)
Procedure Name: LMA Insertion Date/Time: 06/18/2021 4:24 PM Performed by: Jenne Campus, CRNA Pre-anesthesia Checklist: Patient identified, Emergency Drugs available, Suction available and Patient being monitored Patient Re-evaluated:Patient Re-evaluated prior to induction Oxygen Delivery Method: Circle System Utilized Preoxygenation: Pre-oxygenation with 100% oxygen Induction Type: IV induction Ventilation: Mask ventilation without difficulty LMA: LMA inserted LMA Size: 4.0 Number of attempts: 1 Airway Equipment and Method: Bite block Placement Confirmation: positive ETCO2 and breath sounds checked- equal and bilateral Tube secured with: Tape Dental Injury: Teeth and Oropharynx as per pre-operative assessment

## 2021-06-18 NOTE — Transfer of Care (Signed)
Immediate Anesthesia Transfer of Care Note  Patient: Rita Davis  Procedure(s) Performed: PORT PLACEMENT (Left: Chest)  Patient Location: PACU  Anesthesia Type:General  Level of Consciousness: awake, alert  and oriented  Airway & Oxygen Therapy: Patient Spontanous Breathing  Post-op Assessment: Report given to RN and Post -op Vital signs reviewed and stable  Post vital signs: Reviewed and stable  Last Vitals:  Vitals Value Taken Time  BP 141/86 06/18/21 1711  Temp    Pulse 91 06/18/21 1712  Resp 18 06/18/21 1712  SpO2 93 % 06/18/21 1712  Vitals shown include unvalidated device data.  Last Pain:  Vitals:   06/18/21 1339  TempSrc:   PainSc: 0-No pain         Complications: No notable events documented.

## 2021-06-18 NOTE — Interval H&P Note (Signed)
History and Physical Interval Note:  06/18/2021 4:02 PM  Rita Davis  has presented today for surgery, with the diagnosis of RIGHT BREAST CANCER.  The various methods of treatment have been discussed with the patient and family. After consideration of risks, benefits and other options for treatment, the patient has consented to  Procedure(s): PORT PLACEMENT (N/A) as a surgical intervention.  The patient's history has been reviewed, patient examined, no change in status, stable for surgery.  I have reviewed the patient's chart and labs.  Questions were answered to the patient's satisfaction.     Stark Klein

## 2021-06-18 NOTE — Anesthesia Postprocedure Evaluation (Signed)
Anesthesia Post Note  Patient: Rita Davis  Procedure(s) Performed: PORT PLACEMENT (Left: Chest)     Patient location during evaluation: PACU Anesthesia Type: General Level of consciousness: awake and alert Pain management: pain level controlled Vital Signs Assessment: post-procedure vital signs reviewed and stable Respiratory status: spontaneous breathing, nonlabored ventilation and respiratory function stable Cardiovascular status: blood pressure returned to baseline and stable Postop Assessment: no apparent nausea or vomiting Anesthetic complications: no   No notable events documented.  Last Vitals:  Vitals:   06/18/21 1725 06/18/21 1740  BP: (!) 153/93 (!) 145/89  Pulse: 83 66  Resp: 18 15  Temp:  36.7 C  SpO2: 96% 95%    Last Pain:  Vitals:   06/18/21 1740  TempSrc:   PainSc: 0-No pain                 Densil Ottey,W. EDMOND

## 2021-06-18 NOTE — Progress Notes (Signed)
  Echocardiogram 2D Echocardiogram has been performed.  Rita Davis 06/18/2021, 10:58 AM

## 2021-06-18 NOTE — H&P (Signed)
REFERRING PHYSICIAN: Hu  PROVIDER: Georgianne Fick, MD  Care Team: Patient Care Team: Wonda Amis as PCP - General   MRN: [id number] DOB: August 05, 1961 DATE OF ENCOUNTER: 06/08/2021  Subjective   Chief Complaint: Breast Cancer  History of Present Illness: Rita Davis is a 60 y.o. female who is seen today as an office consultation at the request of Dr. Rayburn Go for evaluation of Breast Cancer .   Patient is a 60 year old female with a new diagnosis of right breast cancer August 2022. Patient presents with a palpable mass. She located this on her own. She states that it first she thought it might be a benign mass like she had when she was 15, but still thought that she should get it checked out. She underwent diagnostic imaging revealing a 2.9 cm mass in the right breast upper inner quadrant at 1:00. 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 Of note, the patient has a family history of a mother with breast cancer, a brother with colon cancer, and a father with prostate cancer. She feels like the mass is larger since the biopsy last week.   Diagnostic mammogram: 06/03/21 BCG DIGITAL DIAGNOSTIC BILATERAL MAMMOGRAM WITH TOMOSYNTHESIS AND CAD; ULTRASOUND RIGHT BREAST LIMITED ACR Breast Density Category c: The breast tissue is heterogeneously dense, which may obscure small masses.   FINDINGS: A radiopaque BB was placed at the site of the patient's palpable lump in the upper inner quadrant of the right breast. An irregular mass with spiculated borders is demonstrated just deep to the radiopaque BB. Otherwise, no new or suspicious findings in the remainder of the right breast.   Additional mammographic evaluation of the left breast demonstrates no focal or suspicious abnormality. The parenchymal pattern is stable.   On physical exam, I palpate a  firm, fixed mass in the upper inner quadrant of the right breast.   Targeted ultrasound is performed, showing an irregular hypoechoic mass with associated vascularity at the 1 o'clock position 10 cm from the nipple. It measures 2.9 x 1.6 x 1.7 cm. This correlates with the mammographic finding. Evaluation of the right axilla demonstrates no suspicious lymphadenopathy.   IMPRESSION: 1. Suspicious right breast mass corresponding with the patient's palpable lump. Recommendation is for ultrasound-guided biopsy. 2. No suspicious right axillary lymphadenopathy. 3. No mammographic evidence of malignancy on the left.   RECOMMENDATION: Ultrasound-guided biopsy of the right breast.   I have discussed the findings and recommendations with the patient. If applicable, a reminder letter will be sent to the patient regarding the next appointment.   BI-RADS CATEGORY 5: Highly suggestive of malignancy.  Pathology core needle biopsy: 06/03/21 Breast, right, needle core biopsy, right breast mass- 1:00 o' clock position [coil clip] - INVASIVE DUCTAL CARCINOMA. SEE NOTE Invasive carcinoma measures 1.6 cm in greatest linear dimension and appears grade 3.  Receptors: The tumor cells are EQUIVOCAL for Her2 (2+). Her2 by FISH will be performed and results reported separately. Estrogen Receptor: 5%, POSITIVE, WEAK STAINING INTENSITY Progesterone Receptor: 0%, NEGATIVE Proliferation Marker Ki67: 95%  Review of Systems: A complete review of systems was obtained from the patient. I have reviewed this information and discussed as appropriate with the patient. See HPI as well for other ROS.  Review of Systems  All other systems reviewed and are negative.   Medical History: Past Medical History:  Diagnosis Date   Anemia  History of cancer   Patient Active Problem List  Diagnosis   Malignant neoplasm of upper-inner quadrant of right breast in female, estrogen receptor positive (CMS-HCC)   Family  history of cancer   Past Surgical History:  Procedure Laterality Date   broken ankle N/A   foot surgery N/A   HYSTERECTOMY    No Known Allergies  Current Outpatient Medications on File Prior to Visit  Medication Sig Dispense Refill   amLODIPine-olmesartan (AZOR) 5-40 mg tablet Take 1 tablet by mouth once daily   escitalopram oxalate (LEXAPRO) 10 MG tablet Take 1 tablet by mouth once daily   No current facility-administered medications on file prior to visit.   Family History  Problem Relation Age of Onset   Breast cancer Mother   Breast cancer Father   Colon cancer Brother    Social History   Tobacco Use  Smoking Status Never Smoker  Smokeless Tobacco Never Used    Social History   Socioeconomic History   Marital status: Married  Tobacco Use   Smoking status: Never Smoker   Smokeless tobacco: Never Used  Scientific laboratory technician Use: Never used  Substance and Sexual Activity   Alcohol use: Yes   Drug use: Never   Objective:   Vitals:  06/08/21 1543  BP: 138/64  Pulse: 110  Temp: 36.9 C (98.4 F)  SpO2: 100%  Weight: 90.7 kg (200 lb)  Height: 172.7 cm ('5\' 8"' )   Body mass index is 30.41 kg/m.  Gen: No acute distress. Well nourished and well groomed.  Neurological: Alert and oriented to person, place, and time. Coordination normal.  Head: Normocephalic and atraumatic.  Eyes: Conjunctivae are normal. Pupils are equal, round, and reactive to light. No scleral icterus.  Neck: Normal range of motion. Neck supple. No tracheal deviation or thyromegaly present.  Cardiovascular: Normal rate, regular rhythm, normal heart sounds and intact distal pulses. Exam reveals no gallop and no friction rub. No murmur heard. Breast: palpable mobile mass right breast upper inner quadrant. Faint contour difference in this region. No nipple retraction or discharge. No skin dimpling. No LAD. Breasts symmetric size. Ptotic bilaterally.  Respiratory: Effort normal. No respiratory  distress. No chest wall tenderness. Breath sounds normal. No wheezes, rales or rhonchi.  GI: Soft. Bowel sounds are normal. The abdomen is soft and nontender. There is no rebound and no guarding.  Musculoskeletal: Normal range of motion. Extremities are nontender.  Lymphadenopathy: No cervical, preauricular, postauricular or axillary adenopathy is present Skin: Skin is warm and dry. No rash noted. No diaphoresis. No erythema. No pallor. No clubbing, cyanosis, or edema.  Psychiatric: Normal mood and affect. Behavior is normal. Judgment and thought content normal.   Labs None new.   Assessment and Plan:   Malignant neoplasm of upper-inner quadrant of right breast in female, estrogen receptor positive (CMS-HCC) Pt has a new diagnosis of cT2N0 right breast cancer. This is either phenotypically triple negative or hormone negative/Her 2 positive. Either way, chemo will likely be recommended. The patient prefers chemo first to minimize the amount of breast tissue taken.  I recommended also genetics referral and breast MRI pre op.   I have contacted the breast navigators to get an urgent oncology referral to discuss chemo prior to port placement.   Based on discussion, patient will want to pursue breast conservation. This would most likely be after chemo. She is concerned about worrying her sons as their father died last year.   I reviewed port placement.  After MR is done and oncology referral has been completed, will plan surgery.   Family history of cancer Referral to genetics based on mother with breast and brother with colon cancer.   Father also had prostate cancer  No follow-ups on file.  Milus Height, MD FACS Surgical Oncology, General Surgery, Trauma and [facility]

## 2021-06-18 NOTE — Discharge Instructions (Addendum)
[place] Office Phone Number [phone]   POST OP INSTRUCTIONS  Always review your discharge instruction sheet given to you by the facility where your surgery was performed.  IF YOU HAVE DISABILITY OR FAMILY LEAVE FORMS, YOU MUST BRING THEM TO THE OFFICE FOR PROCESSING.  DO NOT GIVE THEM TO YOUR DOCTOR.  A prescription for pain medication may be given to you upon discharge.  Take your pain medication as prescribed, if needed.  If narcotic pain medicine is not needed, then you may take acetaminophen (Tylenol) or ibuprofen (Advil) as needed. Take your usually prescribed medications unless otherwise directed If you need a refill on your pain medication, please contact your pharmacy.  They will contact our office to request authorization.  Prescriptions will not be filled after 5pm or on week-ends. You should eat very light the first 24 hours after surgery, such as soup, crackers, pudding, etc.  Resume your normal diet the day after surgery It is common to experience some constipation if taking pain medication after surgery.  Increasing fluid intake and taking a stool softener will usually help or prevent this problem from occurring.  A mild laxative (Milk of Magnesia or Miralax) should be taken according to package directions if there are no bowel movements after 48 hours. You may shower in 48 hours.  The surgical glue will flake off in 2-3 weeks.   ACTIVITIES:  No strenuous activity or heavy lifting for 1 week.   You may drive when you no longer are taking prescription pain medication, you can comfortably wear a seatbelt, and you can safely maneuver your car and apply brakes. RETURN TO WORK:  __________to be determined. _______________ Dennis Bast should see your doctor in the office for a follow-up appointment approximately three-four weeks after your surgery.    WHEN TO CALL YOUR DOCTOR: Fever over 101.0 Nausea and/or vomiting. Extreme swelling or bruising. Continued bleeding  from incision. Increased pain, redness, or drainage from the incision.  The clinic staff is available to answer your questions during regular business hours.  Please don't hesitate to call and ask to speak to one of the nurses for clinical concerns.  If you have a medical emergency, go to the nearest emergency room or call 911.  A surgeon from [facility] is always on call at the hospital.  For further questions, please visit [email or web address]

## 2021-06-19 ENCOUNTER — Other Ambulatory Visit: Payer: Self-pay | Admitting: Hematology and Oncology

## 2021-06-19 ENCOUNTER — Encounter: Payer: Self-pay | Admitting: *Deleted

## 2021-06-19 ENCOUNTER — Other Ambulatory Visit: Payer: Self-pay | Admitting: General Surgery

## 2021-06-19 ENCOUNTER — Encounter (HOSPITAL_COMMUNITY): Payer: Self-pay | Admitting: General Surgery

## 2021-06-19 DIAGNOSIS — C50211 Malignant neoplasm of upper-inner quadrant of right female breast: Secondary | ICD-10-CM

## 2021-06-19 DIAGNOSIS — R9389 Abnormal findings on diagnostic imaging of other specified body structures: Secondary | ICD-10-CM

## 2021-06-21 NOTE — Progress Notes (Signed)
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 She was evaluated in the breast clinic 06/09/2021.  Since consultation, she underwent breast MRI on 06/12/2021 showing: breast composition C; biopsy-proven malignancy in upper-inner right breast measures 3.3 cm; indeterminate non-mass enhancement extending anteriorly from mass, all together spanning approximately 5 cm; no evidence of left breast malignancy or abnormal-appearing lymph nodes.  She is scheduled for MRI biopsy of the area in question 06/29/2021.  She also underwent echocardiogram on 06/18/2021 showing an ejection fraction of 55-60%.  On the same day she underwent port placement, with no complications.  She is scheduled to start her chemotherapy 06/24/2021.   REVIEW OF SYSTEMS: Rita Davis is very proactive.  She has already changed her diet.  She left her phone in the car so we would not be interrupted today.  She has discussed her diagnosis with her family and already is planning things to do with her son including a concert in October.  She met with our chemotherapy teaching nurse and greatly benefited from that.  A detailed review of systems today was otherwise stable.  COVID 19 VACCINATION STATUS:  Status post Pfizer x3 as of August 2022   HISTORY OF CURRENT  ILLNESS: From the original intake note:  Rita Davis herself noted a mass in her right breast.  She brought it to medical attention and her mammogram was moved up to 06/03/2021 at the breast center.  This found the breast to be density category C.  On physical exam there was a palpable firm fixed mass in the upper inner quadrant of the right breast.  On the mammogram there was an irregular mass with spiculated borders which on ultrasound measured 2.9 cm at the 1 o'clock position 10 cm from the nipple.  The right axilla was sonographically benign.  Biopsy of the right breast mass in question 06/03/2021 showed an invasive ductal carcinoma, grade 3, estrogen receptor "positive" at 5% with weak staining intensity (functionally estrogen receptor negative), progesterone receptor negative, HER2 equivocal by immunotherapy but negative by FISH, with a proliferation marker of 95%.  Cancer Staging Malignant neoplasm of upper-inner quadrant of right breast in female, estrogen receptor negative ([place]) Staging form: Breast, AJCC 8th Edition - Clinical: Stage IIB (cT2, cN0, cM0, G3, ER-, PR-, HER2-) - Signed by Chauncey Cruel, MD on 06/09/2021  The patient's subsequent history is as detailed below   PAST MEDICAL HISTORY: Past Medical History:  Diagnosis Date   Anemia    had a fibroid tumor, was anemic at that time   Anxiety    Cancer [facility])    breast cancer   Family history of breast cancer    Family history of colon cancer    Family history of prostate cancer    Hyperlipidemia    Hypertension    Pneumonia    as a child   Pre-diabetes     PAST SURGICAL HISTORY: Past Surgical History:  Procedure Laterality  Date   ABDOMINAL HYSTERECTOMY     still has ovaries   BREAST BIOPSY Right 05/23/2012   BREAST CYST EXCISION Right    cyst removed     from lower back   DILATION AND CURETTAGE OF UTERUS     ORIF ANKLE FRACTURE Left 11/13/2020   Procedure: OPEN TREATMENT OF LEFT TRIMALLEOLAR ANKLE FRACTURE WITH  POSTERIOR FIXATION, SYNDESMOSIS;  Surgeon: Erle Crocker, MD;  Location: [place];  Service: Orthopedics;  Laterality: Left;  LENGTH OF SURGERY: 1.5 HOURS   PORTACATH PLACEMENT Left 06/18/2021   Procedure: PORT PLACEMENT;  Surgeon: Stark Klein, MD;  Location: [place];  Service: General;  Laterality: Left;    FAMILY HISTORY Family History  Problem Relation Age of Onset   Hypertension Mother    Breast cancer Mother 56       declined treatment   Prostate cancer Father        metastatic, dx 75s   Colon cancer Brother 47   Cancer Maternal Great-grandmother 69       gynecologic cancer (MGM's mother)  The patient's father died at the age of 71, having had breast cancer twice.  The patient has little information on his side of the family.  The patient's mother is 70 years old as of August 2022.  She is currently under hospice care for metastatic breast cancer having refused active treatment (she was evaluated by Dr. Lindi Adie).  Also on the maternal side there is a great grandmother with ovarian cancer.  The patient had one half brother who died from colon cancer at the age of 46.  Another half brother survives.   GYNECOLOGIC HISTORY:  No LMP recorded. Patient has had a hysterectomy. Menarche: 60 years old Age at first live birth: 60 years old [place] P 2 LMP 2014-12-06 Contraceptive several years with no complications HRT no Hysterectomy?  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  Her husband died 14-Mar-2020 from a cardiac arrest in the setting of renal failure.  The patient lives by herself with no pets.  Son Timmothy Sours graduated from [employer] and works for [employer] in [place].  Son Lovena Le graduated from [employer] with a business administration degree and placed in a band    ADVANCED DIRECTIVES: Not in place.  As of 06/09/2021 the patient has not discussed her diagnosis of breast cancer with her children.   HEALTH  MAINTENANCE: Social History   Tobacco Use   Smoking status: Never   Smokeless tobacco: Never  Substance Use Topics   Alcohol use: No   Drug use: No     Colonoscopy:  PAP:  Bone density:   No Known Allergies  Current Outpatient Medications  Medication Sig Dispense Refill   amLODipine-olmesartan (AZOR) 5-40 MG tablet Take 1 tablet by mouth in the morning.     betamethasone valerate (VALISONE) 0.1 % cream Apply 1 application topically daily as needed (skin irritation.).     bimatoprost (LATISSE) 0.03 % ophthalmic solution Place one drop on applicator and apply evenly along the skin of the upper eyelid at base of eyelashes once daily at bedtime; repeat procedure for second eye (use a clean applicator).     Calcium-Magnesium-Zinc (CAL-MAG-ZINC PO) Take 1 tablet by mouth every other day. In the morning     Cholecalciferol (VITAMIN D3) 250 MCG (10000 UT) TABS Take 10,000 Units by mouth daily.     dexamethasone (DECADRON) 4 MG tablet Take 2 tablets once  a day for 3 days after carboplatin and AC chemotherapy. Take with food. 30 tablet 1   escitalopram (LEXAPRO) 10 MG tablet Take 10 mg by mouth at bedtime.     lidocaine-prilocaine (EMLA) cream Apply to affected area once 30 g 3   Misc Natural Products (SAMBUCUS ELDERBERRY IMMUNE PO) Take 5 mLs by mouth daily.     Misc Natural Products (SUPER GREENS) POWD Take 1 Scoop by mouth daily.     oxyCODONE (OXY IR/ROXICODONE) 5 MG immediate release tablet Take 1 tablet (5 mg total) by mouth every 6 (six) hours as needed for severe pain. 5 tablet 0   pravastatin (PRAVACHOL) 20 MG tablet Take 20 mg by mouth once a week.     prochlorperazine (COMPAZINE) 10 MG tablet Take 1 tablet (10 mg total) by mouth every 6 (six) hours as needed (Nausea or vomiting). 30 tablet 1   No current facility-administered medications for this visit.    OBJECTIVE: African-American woman who appears younger than stated age  79:   06/22/21 1554  BP: (!) 144/76  Pulse:  83  Resp: 18  Temp: 97.9 F (36.6 C)  SpO2: 100%      Body mass index is 31.03 kg/m.   Wt Readings from Last 3 Encounters:  06/22/21 204 lb 1.6 oz (92.6 kg)  06/18/21 200 lb (90.7 kg)  06/09/21 201 lb 9.6 oz (91.4 kg)      ECOG FS:1 - Symptomatic but completely ambulatory  Sclerae unicteric, EOMs intact Wearing a mask No cervical or supraclavicular adenopathy Lungs no rales or rhonchi Heart regular rate and rhythm Abd soft, nontender, positive bowel sounds MSK no focal spinal tenderness, no upper extremity lymphedema Neuro: nonfocal, well oriented, appropriate affect Breasts: Deferred   LAB RESULTS:  CMP     Component Value Date/Time   NA 139 06/09/2021 1538   K 4.3 06/09/2021 1538   CL 104 06/09/2021 1538   CO2 26 06/09/2021 1538   GLUCOSE 82 06/09/2021 1538   BUN 18 06/09/2021 1538   CREATININE 1.24 (H) 06/09/2021 1538   CALCIUM 9.9 06/09/2021 1538   PROT 7.4 06/09/2021 1538   ALBUMIN 3.9 06/09/2021 1538   AST 21 06/09/2021 1538   ALT 24 06/09/2021 1538   ALKPHOS 87 06/09/2021 1538   BILITOT 0.4 06/09/2021 1538   GFRNONAA 50 (L) 06/09/2021 1538   GFRAA 86 04/18/2008 1652    No results found for: TOTALPROTELP, ALBUMINELP, A1GS, A2GS, BETS, BETA2SER, GAMS, MSPIKE, SPEI  No results found for: Nils Pyle, [facility]  Lab Results  Component Value Date   WBC 7.2 06/09/2021   NEUTROABS 4.3 06/09/2021   HGB 12.5 06/09/2021   HCT 37.6 06/09/2021   MCV 90.4 06/09/2021   PLT 247 06/09/2021      Chemistry      Component Value Date/Time   NA 139 06/09/2021 1538   K 4.3 06/09/2021 1538   CL 104 06/09/2021 1538   CO2 26 06/09/2021 1538   BUN 18 06/09/2021 1538   CREATININE 1.24 (H) 06/09/2021 1538      Component Value Date/Time   CALCIUM 9.9 06/09/2021 1538   ALKPHOS 87 06/09/2021 1538   AST 21 06/09/2021 1538   ALT 24 06/09/2021 1538   BILITOT 0.4 06/09/2021 1538       No results found for: LABCA2  No components found for:  QPYPPJ093  No results for input(s): INR in the last 168 hours.  No results found for: LABCA2  No results found for: OIZ124  No results found for: YBO175  No results found for: ZWC585  No results found for: CA2729  No components found for: HGQUANT  No results found for: CEA1 / No results found for: CEA1   No results found for: AFPTUMOR  No results found for: CHROMOGRNA  No results found for: TOTALPROTELP, ALBUMINELP, A1GS, A2GS, BETS, BETA2SER, GAMS, MSPIKE, SPEI (this displays SPEP labs)  No results found for: KPAFRELGTCHN, LAMBDASER, KAPLAMBRATIO (kappa/lambda light chains)  No results found for: HGBA, HGBA2QUANT, HGBFQUANT, HGBSQUAN (Hemoglobinopathy evaluation)   No results found for: LDH  No results found for: IRON, TIBC, IRONPCTSAT (Iron and TIBC)  No results found for: FERRITIN  Urinalysis    Component Value Date/Time   COLORURINE YELLOW 11/20/2007 0951   APPEARANCEUR Sl Cloudy 11/20/2007 0951   LABSPEC > OR = 1.030 11/20/2007 0951   PHURINE 5.5 11/20/2007 0951   GLUCOSEU NEGATIVE 11/20/2007 0951   BILIRUBINUR NEGATIVE 11/20/2007 0951   KETONESUR NEGATIVE 11/20/2007 0951   UROBILINOGEN 0.2 mg/dL 11/20/2007 0951   NITRITE Negative 11/20/2007 0951   LEUKOCYTESUR Negative 11/20/2007 0951    STUDIES: MR BREAST BILATERAL W WO CONTRAST INC CAD  Result Date: 06/15/2021 CLINICAL DATA:  60 year old female with biopsy proven grade 3 invasive ductal carcinoma of the right breast at 1 o'clock (coil clip). She has a ribbon clip in the lower-inner quadrant of the right breast from a biopsy of a benign fibroadenoma from 2013. She presents for MRI for initial staging. She has family history of breast cancer in her mother at age 63. LABS:  Creatinine of 1.24 mg per dL and GFR of 50 on 06/09/2021. EXAM: BILATERAL BREAST MRI WITH AND WITHOUT CONTRAST TECHNIQUE: Multiplanar, multisequence MR images of both breasts were obtained prior to and following the intravenous  administration of 9 ml of Gadavist Three-dimensional MR images were rendered by post-processing of the original MR data on an independent workstation. The three-dimensional MR images were interpreted, and findings are reported in the following complete MRI report for this study. Three dimensional images were evaluated at the independent interpreting workstation using the DynaCAD thin client. COMPARISON:  No prior MRI available for comparison. Correlation made with prior mammograms and ultrasound images. FINDINGS: Breast composition: c. Heterogeneous fibroglandular tissue. Background parenchymal enhancement: Minimal Right breast: The biopsy-proven cancer in the upper inner posterior right breast is identified, with susceptibility artifact centrally located corresponding with the biopsy marking clip. The mass itself measures approximately 3.3 cm by MRI (2.9 cm with ultrasound). There is some additional non mass enhancement extending anteriorly from the mass, all together measuring 5.0 cm (series 12, image 37). No additional suspicious areas of enhancement are found in the right breast. Left breast: No mass or abnormal enhancement. Lymph nodes: No abnormal appearing lymph nodes. Ancillary findings:  None. IMPRESSION: 1. The biopsied proven malignancy in the upper inner right breast measures 3.3 cm. There is indeterminate non mass enhancement extending anteriorly from the mass, which all together spans approximately 5.0 cm. 2.  No MRI evidence of left breast malignancy. RECOMMENDATION: 1. If it will change clinical management, MRI guided biopsy of the anterior margin of the non mass enhancement in the right breast could be performed. Otherwise, continue treatment plan. BI-RADS CATEGORY  6: Known biopsy-proven malignancy. Electronically Signed   By: Ammie Ferrier M.D.   On: 06/15/2021 10:42  DG CHEST PORT 1 VIEW  Result Date: 06/18/2021 CLINICAL DATA:  Port-A-Cath placement EXAM: PORTABLE CHEST 1 VIEW COMPARISON:   None. FINDINGS: Interval placement of a left  chest port with catheter tip overlying the superior cavoatrial junction. The heart size and mediastinal contours are within normal limits. No focal pulmonary opacity. No pleural effusion or pneumothorax. The visualized skeletal structures are unremarkable. IMPRESSION: Status post placement of a left chest port.  Otherwise normal chest. Electronically Signed   By: Merilyn Baba M.D.   On: 06/18/2021 17:38   DG Fluoro Guide CV Line-No Report  Result Date: 06/18/2021 Fluoroscopy was utilized by the requesting physician.  No radiographic interpretation.   US BREAST LTD UNI RIGHT INC AXILLA  Addendum Date: 06/04/2021   ADDENDUM REPORT: 06/04/2021 08:38 ADDENDUM: This is an addendum to the report dictated on 06/03/2021. The all caps section should read: On physical exam I palpate a firm, fixed mass in the upper inner quadrant of the RIGHT breast. The positive findings on this study were in the right breast. Electronically Signed   By: Kristopher Oppenheim M.D.   On: 06/04/2021 08:38   Result Date: 06/04/2021 CLINICAL DATA:  60 year old female with a palpable right breast lump for 2 weeks. EXAM: DIGITAL DIAGNOSTIC BILATERAL MAMMOGRAM WITH TOMOSYNTHESIS AND CAD; ULTRASOUND RIGHT BREAST LIMITED TECHNIQUE: Bilateral digital diagnostic mammography and breast tomosynthesis was performed. The images were evaluated with computer-aided detection.; Targeted ultrasound examination of the right breast was performed COMPARISON:  Previous exam(s). ACR Breast Density Category c: The breast tissue is heterogeneously dense, which may obscure small masses. FINDINGS: A radiopaque BB was placed at the site of the patient's palpable lump in the upper inner quadrant of the right breast. An irregular mass with spiculated borders is demonstrated just deep to the radiopaque BB. Otherwise, no new or suspicious findings in the remainder of the right breast. Additional mammographic evaluation of the  left breast demonstrates no focal or suspicious abnormality. The parenchymal pattern is stable. On physical exam, I palpate a firm, fixed mass in the upper inner quadrant of the left breast. Targeted ultrasound is performed, showing an irregular hypoechoic mass with associated vascularity at the 1 o'clock position 10 cm from the nipple. It measures 2.9 x 1.6 x 1.7 cm. This correlates with the mammographic finding. Evaluation of the right axilla demonstrates no suspicious lymphadenopathy. IMPRESSION: 1. Suspicious right breast mass corresponding with the patient's palpable lump. Recommendation is for ultrasound-guided biopsy. 2. No suspicious right axillary lymphadenopathy. 3. No mammographic evidence of malignancy on the left. RECOMMENDATION: Ultrasound-guided biopsy of the right breast. I have discussed the findings and recommendations with the patient. If applicable, a reminder letter will be sent to the patient regarding the next appointment. BI-RADS CATEGORY  5: Highly suggestive of malignancy. Electronically Signed: By: Kristopher Oppenheim M.D. On: 06/03/2021 14:38  MM DIAG BREAST TOMO BILATERAL  Addendum Date: 06/04/2021   ADDENDUM REPORT: 06/04/2021 08:38 ADDENDUM: This is an addendum to the report dictated on 06/03/2021. The all caps section should read: On physical exam I palpate a firm, fixed mass in the upper inner quadrant of the RIGHT breast. The positive findings on this study were in the right breast. Electronically Signed   By: Kristopher Oppenheim M.D.   On: 06/04/2021 08:38   Result Date: 06/04/2021 CLINICAL DATA:  60 year old female with a palpable right breast lump for 2 weeks. EXAM: DIGITAL DIAGNOSTIC BILATERAL MAMMOGRAM WITH TOMOSYNTHESIS AND CAD; ULTRASOUND RIGHT BREAST LIMITED TECHNIQUE: Bilateral digital diagnostic mammography and breast tomosynthesis was performed. The images were evaluated with computer-aided detection.; Targeted ultrasound examination of the right breast was performed  COMPARISON:  Previous exam(s). ACR Breast Density Category c:  The breast tissue is heterogeneously dense, which may obscure small masses. FINDINGS: A radiopaque BB was placed at the site of the patient's palpable lump in the upper inner quadrant of the right breast. An irregular mass with spiculated borders is demonstrated just deep to the radiopaque BB. Otherwise, no new or suspicious findings in the remainder of the right breast. Additional mammographic evaluation of the left breast demonstrates no focal or suspicious abnormality. The parenchymal pattern is stable. On physical exam, I palpate a firm, fixed mass in the upper inner quadrant of the left breast. Targeted ultrasound is performed, showing an irregular hypoechoic mass with associated vascularity at the 1 o'clock position 10 cm from the nipple. It measures 2.9 x 1.6 x 1.7 cm. This correlates with the mammographic finding. Evaluation of the right axilla demonstrates no suspicious lymphadenopathy. IMPRESSION: 1. Suspicious right breast mass corresponding with the patient's palpable lump. Recommendation is for ultrasound-guided biopsy. 2. No suspicious right axillary lymphadenopathy. 3. No mammographic evidence of malignancy on the left. RECOMMENDATION: Ultrasound-guided biopsy of the right breast. I have discussed the findings and recommendations with the patient. If applicable, a reminder letter will be sent to the patient regarding the next appointment. BI-RADS CATEGORY  5: Highly suggestive of malignancy. Electronically Signed: By: Kristopher Oppenheim M.D. On: 06/03/2021 14:38  ECHOCARDIOGRAM COMPLETE  Result Date: 06/18/2021    ECHOCARDIOGRAM REPORT   Patient Name:   Rita Davis Date of Exam: 06/18/2021 Medical Rec #:  [id number]       Height:       68.0 in Accession #:    [id number]      Weight:       201.6 lb Date of Birth:  11/06/1960       BSA:          2.051 m Patient Age:    35 years        BP:           149/77 mmHg Patient Gender: F                HR:           56 bpm. Exam Location:  Outpatient Procedure: 2D Echo, Color Doppler, Cardiac Doppler and Strain Analysis Indications:    Chemo Z09  History:        Patient has no prior history of Echocardiogram examinations.                 Risk Factors:Dyslipidemia and Hypertension.  Sonographer:    Bernadene Person RDCS Referring Phys: March ARB  1. Left ventricular ejection fraction, by estimation, is 55 to 60%. The left ventricle has normal function. The left ventricle has no regional wall motion abnormalities. Left ventricular diastolic parameters were normal. The average left ventricular global longitudinal strain is -21.1 %. The global longitudinal strain is normal.  2. Right ventricular systolic function is normal. The right ventricular size is normal.  3. The mitral valve is normal in structure. Trivial mitral valve regurgitation. No evidence of mitral stenosis.  4. The aortic valve is normal in structure. There is mild calcification of the aortic valve. Aortic valve regurgitation is not visualized. Mild aortic valve sclerosis is present, with no evidence of aortic valve stenosis.  5. The inferior vena cava is normal in size with greater than 50% respiratory variability, suggesting right atrial pressure of 3 mmHg. FINDINGS  Left Ventricle: Left ventricular ejection fraction, by estimation, is 55 to 60%. The left ventricle has normal  function. The left ventricle has no regional wall motion abnormalities. The average left ventricular global longitudinal strain is -21.1 %. The global longitudinal strain is normal. The left ventricular internal cavity size was normal in size. There is no left ventricular hypertrophy. Left ventricular diastolic parameters were normal. Right Ventricle: The right ventricular size is normal. No increase in right ventricular wall thickness. Right ventricular systolic function is normal. Left Atrium: Left atrial size was normal in size. Right Atrium: Right  atrial size was normal in size. Pericardium: There is no evidence of pericardial effusion. Mitral Valve: The mitral valve is normal in structure. Trivial mitral valve regurgitation. No evidence of mitral valve stenosis. Tricuspid Valve: The tricuspid valve is normal in structure. Tricuspid valve regurgitation is not demonstrated. No evidence of tricuspid stenosis. Aortic Valve: The aortic valve is normal in structure. There is mild calcification of the aortic valve. Aortic valve regurgitation is not visualized. Mild aortic valve sclerosis is present, with no evidence of aortic valve stenosis. Pulmonic Valve: The pulmonic valve was normal in structure. Pulmonic valve regurgitation is trivial. No evidence of pulmonic stenosis. Aorta: The aortic root is normal in size and structure. Venous: The inferior vena cava is normal in size with greater than 50% respiratory variability, suggesting right atrial pressure of 3 mmHg. IAS/Shunts: No atrial level shunt detected by color flow Doppler.  LEFT VENTRICLE PLAX 2D LVIDd:         4.60 cm  Diastology LVIDs:         2.70 cm  LV e' medial:    6.12 cm/s LV PW:         1.10 cm  LV E/e' medial:  11.5 LV IVS:        1.00 cm  LV e' lateral:   8.66 cm/s LVOT diam:     2.00 cm  LV E/e' lateral: 8.2 LV SV:         68 LV SV Index:   33       2D Longitudinal Strain LVOT Area:     3.14 cm 2D Strain GLS Avg:     -21.1 %  RIGHT VENTRICLE RV S prime:     9.00 cm/s TAPSE (M-mode): 1.8 cm LEFT ATRIUM             Index       RIGHT ATRIUM           Index LA diam:        3.00 cm 1.46 cm/m  RA Area:     11.60 cm LA Vol (A2C):   42.0 ml 20.48 ml/m RA Volume:   19.70 ml  9.61 ml/m LA Vol (A4C):   35.9 ml 17.51 ml/m LA Biplane Vol: 39.5 ml 19.26 ml/m  AORTIC VALVE LVOT Vmax:   98.60 cm/s LVOT Vmean:  62.900 cm/s LVOT VTI:    0.218 m  AORTA Ao Root diam: 3.40 cm Ao Asc diam:  3.20 cm MITRAL VALVE MV Area (PHT): 2.91 cm    SHUNTS MV Decel Time: 261 msec    Systemic VTI:  0.22 m MV E velocity:  70.60 cm/s  Systemic Diam: 2.00 cm MV A velocity: 77.50 cm/s MV E/A ratio:  0.91 Jenkins Rouge MD Electronically signed by Jenkins Rouge MD Signature Date/Time: 06/18/2021/12:34:34 PM    Final    MM CLIP PLACEMENT RIGHT  Result Date: 06/03/2021 CLINICAL DATA:  Evaluate COIL clip placement following ultrasound-guided RIGHT breast biopsy. EXAM: 3D DIAGNOSTIC RIGHT MAMMOGRAM POST ULTRASOUND BIOPSY COMPARISON:  Previous  exam(s). FINDINGS: 3D Mammographic images were obtained following ultrasound guided biopsy of the 2.9 cm mass at the 1 o'clock position of the RIGHT breast. The COIL biopsy marking clip is in expected position at the site of biopsy. IMPRESSION: Appropriate positioning of the COIL shaped biopsy marking clip at the site of biopsy in the UPPER INNER RIGHT breast. Final Assessment: Post Procedure Mammograms for Marker Placement Electronically Signed   By: Margarette [place] M.D.   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 This was found to be concordant by Dr. Hassan Rowan. Pathology results were discussed with the patient by telephone. The patient reported doing well after the biopsy with tenderness at the site. Post biopsy instructions and care were reviewed and questions were answered. The patient was encouraged to call [facility] for any additional concerns. Surgical consultation has been arranged with Dr. Stark Klein at [facility] on June 08, 2021. Pathology results reported by Stacie Acres RN on 06/04/2021. Electronically Signed   By: Margarette [place] M.D.   On: 06/04/2021 16:50   Result Date: 06/05/2021 CLINICAL DATA:  60 year old female for tissue sampling of 2.9 cm UPPER INNER RIGHT breast mass. EXAM: ULTRASOUND GUIDED RIGHT BREAST CORE NEEDLE BIOPSY  COMPARISON:  Previous exam(s). FINDINGS: I met with the patient and we discussed the procedure of ultrasound-guided biopsy, including benefits and alternatives. We discussed the high likelihood of a successful procedure. We discussed the risks of the procedure, including infection, bleeding, tissue injury, clip migration, and inadequate sampling. Informed written consent was given. The usual time-out protocol was performed immediately prior to the procedure. Lesion quadrant: UPPER INNER RIGHT breast Using sterile technique and 1% Lidocaine as local anesthetic, under direct ultrasound visualization, a 12 gauge spring-loaded device was used to perform biopsy of the 2.9 cm mass at the 1 o'clock position of the RIGHT breast 10 cm from the nipple using a LATERAL approach. At the conclusion of the procedure a COIL tissue marker clip was deployed into the biopsy cavity. Follow up 2 view mammogram was performed and dictated separately. IMPRESSION: Ultrasound guided biopsy of 2.9 cm UPPER INNER RIGHT breast mass. No apparent complications. Electronically Signed: By: Margarette [place] M.D. On: 06/03/2021 15:01   ELIGIBLE FOR AVAILABLE RESEARCH PROTOCOL: no  ASSESSMENT: 60 y.o. [place] woman status post right breast upper inner quadrant biopsy 06/03/2021 for a clinical T2N0 invasive ductal carcinoma, grade 3, functionally triple negative, with an MIB-1 of 95%.  (1) genetics testing  (2) neoadjuvant chemotherapy to consist of carboplatin paclitaxel pembrolizumab for 4 cycles starting 06/23/2021 followed by doxorubicin and cyclophosphamide pembrolizumab for 4 cycles  (3) definitive surgery to follow  (4) adjuvant radiation as appropriate   PLAN: Jillaine has been doing "a lot of reading" and today I gave her one of our breast cancer treatment books so she could get her "PhD in breast cancer care".  She has a good understanding of how to take her supportive medicines and I reviewed that in detail for her today.  The  first 4 cycles of the chemotherapy she is receiving is quite complex.  The antinausea medicine she takes in the first week is not the same that she takes in the second and third week.  I went over all that with  her and I also made sure she was scheduled for the immune booster shots after the third dose of chemo.  I also set her up for the second cycle.  It is a good practice in this particular protocol to stay 1 cycle ahead in terms of scheduling because of the complexity.  She will see one of our advanced practice providers with her first Doxil treatment and I asked Rita Davis to keep a diary of symptoms so that when she meets with our APP she will be able to report what ever symptoms she experienced and we can troubleshoot those.  I will then see her with her third week of treatment.  Of course she knows to call us for any questions or concerns that she may have before those visits.  Total encounter time 35 minutes.Chauncey Cruel, MD   06/22/2021 4:45 PM Medical Oncology and Hematology [facility] [place], [place] [address] Tel. [phone]    Fax. [phone]    I, Wilburn Mylar, am acting as scribe for Dr. Virgie Dad. Nahum Sherrer.  I, Lurline Del MD, have reviewed the above documentation for accuracy and completeness, and I agree with the above.    *Total Encounter Time as defined by the Centers for Medicare and Medicaid Services includes, in addition to the face-to-face time of a patient visit (documented in the note above) non-face-to-face time: obtaining and reviewing outside history, ordering and reviewing medications, tests or procedures, care coordination (communications with other health care professionals or caregivers) and documentation in the medical record.

## 2021-06-22 ENCOUNTER — Other Ambulatory Visit: Payer: Self-pay

## 2021-06-22 ENCOUNTER — Inpatient Hospital Stay: Payer: BC Managed Care – PPO | Admitting: Oncology

## 2021-06-22 VITALS — BP 144/76 | HR 83 | Temp 97.9°F | Resp 18 | Ht 68.0 in | Wt 204.1 lb

## 2021-06-22 DIAGNOSIS — C50211 Malignant neoplasm of upper-inner quadrant of right female breast: Secondary | ICD-10-CM | POA: Diagnosis not present

## 2021-06-22 DIAGNOSIS — Z5112 Encounter for antineoplastic immunotherapy: Secondary | ICD-10-CM | POA: Diagnosis not present

## 2021-06-22 DIAGNOSIS — Z171 Estrogen receptor negative status [ER-]: Secondary | ICD-10-CM | POA: Diagnosis not present

## 2021-06-24 ENCOUNTER — Encounter: Payer: Self-pay | Admitting: Oncology

## 2021-06-24 ENCOUNTER — Inpatient Hospital Stay: Payer: BC Managed Care – PPO

## 2021-06-24 ENCOUNTER — Encounter: Payer: Self-pay | Admitting: *Deleted

## 2021-06-24 ENCOUNTER — Other Ambulatory Visit: Payer: Self-pay

## 2021-06-24 VITALS — BP 131/83 | HR 79 | Temp 98.0°F | Resp 18

## 2021-06-24 DIAGNOSIS — C50211 Malignant neoplasm of upper-inner quadrant of right female breast: Secondary | ICD-10-CM

## 2021-06-24 DIAGNOSIS — Z5112 Encounter for antineoplastic immunotherapy: Secondary | ICD-10-CM | POA: Diagnosis not present

## 2021-06-24 DIAGNOSIS — Z171 Estrogen receptor negative status [ER-]: Secondary | ICD-10-CM

## 2021-06-24 DIAGNOSIS — Z95828 Presence of other vascular implants and grafts: Secondary | ICD-10-CM

## 2021-06-24 HISTORY — DX: Presence of other vascular implants and grafts: Z95.828

## 2021-06-24 LAB — CMP (CANCER CENTER ONLY)
ALT: 28 U/L (ref 0–44)
AST: 20 U/L (ref 15–41)
Albumin: 3.8 g/dL (ref 3.5–5.0)
Alkaline Phosphatase: 89 U/L (ref 38–126)
Anion gap: 8 (ref 5–15)
BUN: 16 mg/dL (ref 6–20)
CO2: 27 mmol/L (ref 22–32)
Calcium: 9.3 mg/dL (ref 8.9–10.3)
Chloride: 104 mmol/L (ref 98–111)
Creatinine: 1.13 mg/dL — ABNORMAL HIGH (ref 0.44–1.00)
GFR, Estimated: 56 mL/min — ABNORMAL LOW (ref >60)
Glucose, Bld: 132 mg/dL — ABNORMAL HIGH (ref 70–99)
Potassium: 3.6 mmol/L (ref 3.5–5.1)
Sodium: 139 mmol/L (ref 135–145)
Total Bilirubin: 0.4 mg/dL (ref 0.3–1.2)
Total Protein: 7.4 g/dL (ref 6.5–8.1)

## 2021-06-24 LAB — CBC WITH DIFFERENTIAL (CANCER CENTER ONLY)
Abs Immature Granulocytes: 0.06 10*3/uL (ref 0.00–0.07)
Basophils Absolute: 0.1 10*3/uL (ref 0.0–0.1)
Basophils Relative: 1 %
Eosinophils Absolute: 0.2 10*3/uL (ref 0.0–0.5)
Eosinophils Relative: 3 %
HCT: 37.2 % (ref 36.0–46.0)
Hemoglobin: 12.6 g/dL (ref 12.0–15.0)
Immature Granulocytes: 1 %
Lymphocytes Relative: 22 %
Lymphs Abs: 1.8 10*3/uL (ref 0.7–4.0)
MCH: 30.1 pg (ref 26.0–34.0)
MCHC: 33.9 g/dL (ref 30.0–36.0)
MCV: 88.8 fL (ref 80.0–100.0)
Monocytes Absolute: 0.4 10*3/uL (ref 0.1–1.0)
Monocytes Relative: 6 %
Neutro Abs: 5.4 10*3/uL (ref 1.7–7.7)
Neutrophils Relative %: 67 %
Platelet Count: 229 10*3/uL (ref 150–400)
RBC: 4.19 MIL/uL (ref 3.87–5.11)
RDW: 14.5 % (ref 11.5–15.5)
WBC Count: 8 10*3/uL (ref 4.0–10.5)
nRBC: 0 % (ref 0.0–0.2)

## 2021-06-24 LAB — TSH: TSH: 1.382 u[IU]/mL (ref 0.308–3.960)

## 2021-06-24 MED ORDER — SODIUM CHLORIDE 0.9% FLUSH
10.0000 mL | Freq: Once | INTRAVENOUS | Status: AC
  Administered 2021-06-24: 10 mL

## 2021-06-24 MED ORDER — PALONOSETRON HCL INJECTION 0.25 MG/5ML
0.2500 mg | Freq: Once | INTRAVENOUS | Status: AC
  Administered 2021-06-24: 0.25 mg via INTRAVENOUS
  Filled 2021-06-24: qty 5

## 2021-06-24 MED ORDER — FAMOTIDINE 20 MG IN NS 100 ML IVPB
20.0000 mg | Freq: Once | INTRAVENOUS | Status: AC
  Administered 2021-06-24: 20 mg via INTRAVENOUS
  Filled 2021-06-24: qty 100

## 2021-06-24 MED ORDER — SODIUM CHLORIDE 0.9% FLUSH
10.0000 mL | INTRAVENOUS | Status: DC | PRN
  Administered 2021-06-24: 10 mL

## 2021-06-24 MED ORDER — DIPHENHYDRAMINE HCL 50 MG/ML IJ SOLN
25.0000 mg | Freq: Once | INTRAMUSCULAR | Status: AC
  Administered 2021-06-24: 25 mg via INTRAVENOUS
  Filled 2021-06-24: qty 1

## 2021-06-24 MED ORDER — SODIUM CHLORIDE 0.9 % IV SOLN: Freq: Once | INTRAVENOUS | Status: AC

## 2021-06-24 MED ORDER — SODIUM CHLORIDE 0.9 % IV SOLN
150.0000 mg | Freq: Once | INTRAVENOUS | Status: AC
  Administered 2021-06-24: 150 mg via INTRAVENOUS
  Filled 2021-06-24: qty 150

## 2021-06-24 MED ORDER — SODIUM CHLORIDE 0.9 % IV SOLN
10.0000 mg | Freq: Once | INTRAVENOUS | Status: AC
  Administered 2021-06-24: 10 mg via INTRAVENOUS
  Filled 2021-06-24: qty 10

## 2021-06-24 MED ORDER — SODIUM CHLORIDE 0.9 % IV SOLN
80.0000 mg/m2 | Freq: Once | INTRAVENOUS | Status: AC
  Administered 2021-06-24: 168 mg via INTRAVENOUS
  Filled 2021-06-24: qty 28

## 2021-06-24 MED ORDER — SODIUM CHLORIDE 0.9 % IV SOLN
200.0000 mg | Freq: Once | INTRAVENOUS | Status: AC
  Administered 2021-06-24: 200 mg via INTRAVENOUS
  Filled 2021-06-24: qty 8

## 2021-06-24 MED ORDER — SODIUM CHLORIDE 0.9 % IV SOLN
507.0000 mg | Freq: Once | INTRAVENOUS | Status: AC
  Administered 2021-06-24: 510 mg via INTRAVENOUS
  Filled 2021-06-24: qty 51

## 2021-06-24 MED ORDER — HEPARIN SOD (PORK) LOCK FLUSH 100 UNIT/ML IV SOLN
500.0000 [IU] | Freq: Once | INTRAVENOUS | Status: AC | PRN
  Administered 2021-06-24: 500 [IU]

## 2021-06-24 NOTE — Addendum Note (Signed)
Addended by: Chauncey Cruel on: 06/24/2021 09:50 AM   Modules accepted: Orders

## 2021-06-24 NOTE — Patient Instructions (Signed)
[facility]  Discharge Instructions: Thank you for choosing [facility] to provide your oncology and hematology care.   If you have a lab appointment with the [facility], please go directly to the [facility] and check in at the registration area.   Wear comfortable clothing and clothing appropriate for easy access to any Portacath or PICC line.   We strive to give you quality time with your provider. You may need to reschedule your appointment if you arrive late (15 or more minutes).  Arriving late affects you and other patients whose appointments are after yours.  Also, if you miss three or more appointments without notifying the office, you may be dismissed from the clinic at the provider's discretion.      For prescription refill requests, have your pharmacy contact our office and allow 72 hours for refills to be completed.    Today you received the following chemotherapy and/or immunotherapy agents keytruda/taxol/carboplatin      To help prevent nausea and vomiting after your treatment, we encourage you to take your nausea medication as directed.  BELOW ARE SYMPTOMS THAT SHOULD BE REPORTED IMMEDIATELY: *FEVER GREATER THAN 100.4 F (38 C) OR HIGHER *CHILLS OR SWEATING *NAUSEA AND VOMITING THAT IS NOT CONTROLLED WITH YOUR NAUSEA MEDICATION *UNUSUAL SHORTNESS OF BREATH *UNUSUAL BRUISING OR BLEEDING *URINARY PROBLEMS (pain or burning when urinating, or frequent urination) *BOWEL PROBLEMS (unusual diarrhea, constipation, pain near the anus) TENDERNESS IN MOUTH AND THROAT WITH OR WITHOUT PRESENCE OF ULCERS (sore throat, sores in mouth, or a toothache) UNUSUAL RASH, SWELLING OR PAIN  UNUSUAL VAGINAL DISCHARGE OR ITCHING   Items with * indicate a potential emergency and should be followed up as soon as possible or go to the Emergency Department if any problems should occur.  Please show the CHEMOTHERAPY ALERT CARD or IMMUNOTHERAPY ALERT CARD  at check-in to the Emergency Department and triage nurse.  Should you have questions after your visit or need to cancel or reschedule your appointment, please contact [facility]  Dept: [phone]  and follow the prompts.  Office hours are 8:00 a.m. to 4:30 p.m. Monday - Friday. Please note that voicemails left after 4:00 p.m. may not be returned until the following business day.  We are closed weekends and major holidays. You have access to a nurse at all times for urgent questions. Please call the main number to the clinic Dept: [phone] and follow the prompts.   For any non-urgent questions, you may also contact your provider using MyChart. We now offer e-Visits for anyone 15 and older to request care online for non-urgent symptoms. For details visit [email or web address].   Also download the MyChart app! Go to the app store, search "MyChart", open the app, select [facility], and log in with your MyChart username and password.  Due to Covid, a mask is required upon entering the hospital/clinic. If you do not have a mask, one will be given to you upon arrival. For doctor visits, patients may have 1 support person aged 68 or older with them. For treatment visits, patients cannot have anyone with them due to current Covid guidelines and our immunocompromised population.   Pembrolizumab injection What is this medication? PEMBROLIZUMAB (pem broe liz ue mab) is a monoclonal antibody. It is used totreat certain types of cancer. This medicine may be used for other purposes; ask your health care provider orpharmacist if you have questions. COMMON BRAND NAME(S): Keytruda What should I  tell my care team before I take this medication? They need to know if you have any of these conditions: autoimmune diseases like Crohn's disease, ulcerative colitis, or lupus have had or planning to have an allogeneic stem cell transplant (uses someone else's stem cells) history of  organ transplant history of chest radiation nervous system problems like myasthenia gravis or Guillain-Barre syndrome an unusual or allergic reaction to pembrolizumab, other medicines, foods, dyes, or preservatives pregnant or trying to get pregnant breast-feeding How should I use this medication? This medicine is for infusion into a vein. It is given by a health careprofessional in a hospital or clinic setting. A special MedGuide will be given to you before each treatment. Be sure to readthis information carefully each time. Talk to your pediatrician regarding the use of this medicine in children. While this drug may be prescribed for children as young as 6 months for selectedconditions, precautions do apply. Overdosage: If you think you have taken too much of this medicine contact apoison control center or emergency room at once. NOTE: This medicine is only for you. Do not share this medicine with others. What if I miss a dose? It is important not to miss your dose. Call your doctor or health careprofessional if you are unable to keep an appointment. What may interact with this medication? Interactions have not been studied. This list may not describe all possible interactions. Give your health care provider a list of all the medicines, herbs, non-prescription drugs, or dietary supplements you use. Also tell them if you smoke, drink alcohol, or use illegaldrugs. Some items may interact with your medicine. What should I watch for while using this medication? Your condition will be monitored carefully while you are receiving thismedicine. You may need blood work done while you are taking this medicine. Do not become pregnant while taking this medicine or for 4 months after stopping it. Women should inform their doctor if they wish to become pregnant or think they might be pregnant. There is a potential for serious side effects to an unborn child. Talk to your health care professional or  pharmacist for more information. Do not breast-feed an infant while taking this medicine orfor 4 months after the last dose. What side effects may I notice from receiving this medication? Side effects that you should report to your doctor or health care professionalas soon as possible: allergic reactions like skin rash, itching or hives, swelling of the face, lips, or tongue bloody or black, tarry breathing problems changes in vision chest pain chills confusion constipation cough diarrhea dizziness or feeling faint or lightheaded fast or irregular heartbeat fever flushing joint pain low blood counts - this medicine may decrease the number of white blood cells, red blood cells and platelets. You may be at increased risk for infections and bleeding. muscle pain muscle weakness pain, tingling, numbness in the hands or feet persistent headache redness, blistering, peeling or loosening of the skin, including inside the mouth signs and symptoms of high blood sugar such as dizziness; dry mouth; dry skin; fruity breath; nausea; stomach pain; increased hunger or thirst; increased urination signs and symptoms of kidney injury like trouble passing urine or change in the amount of urine signs and symptoms of liver injury like dark urine, light-colored stools, loss of appetite, nausea, right upper belly pain, yellowing of the eyes or skin sweating swollen lymph nodes weight loss Side effects that usually do not require medical attention (report to yourdoctor or health care professional if they continue  or are bothersome): decreased appetite hair loss tiredness This list may not describe all possible side effects. Call your doctor for medical advice about side effects. You may report side effects to FDA at1-800-FDA-1088. Where should I keep my medication? This drug is given in a hospital or clinic and will not be stored at home. NOTE: This sheet is a summary. It may not cover all possible  information. If you have questions about this medicine, talk to your doctor, pharmacist, orhealth care provider.  2022 Elsevier/Gold Standard (2019-09-19 21:44:53)  Paclitaxel injection What is this medication? PACLITAXEL (PAK li TAX el) is a chemotherapy drug. It targets fast dividing cells, like cancer cells, and causes these cells to die. This medicine is used to treat ovarian cancer, breast cancer, lung cancer, Kaposi's sarcoma, andother cancers. This medicine may be used for other purposes; ask your health care provider orpharmacist if you have questions. COMMON BRAND NAME(S): Onxol, Taxol What should I tell my care team before I take this medication? They need to know if you have any of these conditions: history of irregular heartbeat liver disease low blood counts, like low white cell, platelet, or red cell counts lung or breathing disease, like asthma tingling of the fingers or toes, or other nerve disorder an unusual or allergic reaction to paclitaxel, alcohol, polyoxyethylated castor oil, other chemotherapy, other medicines, foods, dyes, or preservatives pregnant or trying to get pregnant breast-feeding How should I use this medication? This drug is given as an infusion into a vein. It is administered in a hospitalor clinic by a specially trained health care professional. Talk to your pediatrician regarding the use of this medicine in children.Special care may be needed. Overdosage: If you think you have taken too much of this medicine contact apoison control center or emergency room at once. NOTE: This medicine is only for you. Do not share this medicine with others. What if I miss a dose? It is important not to miss your dose. Call your doctor or health careprofessional if you are unable to keep an appointment. What may interact with this medication? Do not take this medicine with any of the following medications: live virus vaccines This medicine may also interact with the  following medications: antiviral medicines for hepatitis, HIV or AIDS certain antibiotics like erythromycin and clarithromycin certain medicines for fungal infections like ketoconazole and itraconazole certain medicines for seizures like carbamazepine, phenobarbital, phenytoin gemfibrozil nefazodone rifampin St. John's wort This list may not describe all possible interactions. Give your health care provider a list of all the medicines, herbs, non-prescription drugs, or dietary supplements you use. Also tell them if you smoke, drink alcohol, or use illegaldrugs. Some items may interact with your medicine. What should I watch for while using this medication? Your condition will be monitored carefully while you are receiving this medicine. You will need important blood work done while you are taking thismedicine. This medicine can cause serious allergic reactions. To reduce your risk you will need to take other medicine(s) before treatment with this medicine. If you experience allergic reactions like skin rash, itching or hives, swelling of theface, lips, or tongue, tell your doctor or health care professional right away. In some cases, you may be given additional medicines to help with side effects.Follow all directions for their use. This drug may make you feel generally unwell. This is not uncommon, as chemotherapy can affect healthy cells as well as cancer cells. Report any side effects. Continue your course of treatment even though you feel  ill unless yourdoctor tells you to stop. Call your doctor or health care professional for advice if you get a fever, chills or sore throat, or other symptoms of a cold or flu. Do not treat yourself. This drug decreases your body's ability to fight infections. Try toavoid being around people who are sick. This medicine may increase your risk to bruise or bleed. Call your doctor orhealth care professional if you notice any unusual bleeding. Be careful brushing  and flossing your teeth or using a toothpick because you may get an infection or bleed more easily. If you have any dental work done,tell your dentist you are receiving this medicine. Avoid taking products that contain aspirin, acetaminophen, ibuprofen, naproxen, or ketoprofen unless instructed by your doctor. These medicines may hide afever. Do not become pregnant while taking this medicine. Women should inform their doctor if they wish to become pregnant or think they might be pregnant. There is a potential for serious side effects to an unborn child. Talk to your health care professional or pharmacist for more information. Do not breast-feed aninfant while taking this medicine. Men are advised not to father a child while receiving this medicine. This product may contain alcohol. Ask your pharmacist or healthcare provider if this medicine contains alcohol. Be sure to tell all healthcare providers you are taking this medicine. Certain medicines, like metronidazole and disulfiram, can cause an unpleasant reaction when taken with alcohol. The reaction includes flushing, headache, nausea, vomiting, sweating, and increased thirst. Thereaction can last from 30 minutes to several hours. What side effects may I notice from receiving this medication? Side effects that you should report to your doctor or health care professionalas soon as possible: allergic reactions like skin rash, itching or hives, swelling of the face, lips, or tongue breathing problems changes in vision fast, irregular heartbeat high or low blood pressure mouth sores pain, tingling, numbness in the hands or feet signs of decreased platelets or bleeding - bruising, pinpoint red spots on the skin, black, tarry stools, blood in the urine signs of decreased red blood cells - unusually weak or tired, feeling faint or lightheaded, falls signs of infection - fever or chills, cough, sore throat, pain or difficulty passing urine signs and  symptoms of liver injury like dark yellow or brown urine; general ill feeling or flu-like symptoms; light-colored stools; loss of appetite; nausea; right upper belly pain; unusually weak or tired; yellowing of the eyes or skin swelling of the ankles, feet, hands unusually slow heartbeat Side effects that usually do not require medical attention (report to yourdoctor or health care professional if they continue or are bothersome): diarrhea hair loss loss of appetite muscle or joint pain nausea, vomiting pain, redness, or irritation at site where injected tiredness This list may not describe all possible side effects. Call your doctor for medical advice about side effects. You may report side effects to FDA at1-800-FDA-1088. Where should I keep my medication? This drug is given in a hospital or clinic and will not be stored at home. NOTE: This sheet is a summary. It may not cover all possible information. If you have questions about this medicine, talk to your doctor, pharmacist, orhealth care provider.  2022 Elsevier/Gold Standard (2019-09-19 13:37:23)   Carboplatin injection What is this medication? CARBOPLATIN (KAR boe pla tin) is a chemotherapy drug. It targets fast dividing cells, like cancer cells, and causes these cells to die. This medicine is usedto treat ovarian cancer and many other cancers. This medicine may be used  for other purposes; ask your health care provider orpharmacist if you have questions. COMMON BRAND NAME(S): Paraplatin What should I tell my care team before I take this medication? They need to know if you have any of these conditions: blood disorders hearing problems kidney disease recent or ongoing radiation therapy an unusual or allergic reaction to carboplatin, cisplatin, other chemotherapy, other medicines, foods, dyes, or preservatives pregnant or trying to get pregnant breast-feeding How should I use this medication? This drug is usually given as an  infusion into a vein. It is administered in [place] or clinic by a specially trained health care professional. Talk to your pediatrician regarding the use of this medicine in children.Special care may be needed. Overdosage: If you think you have taken too much of this medicine contact apoison control center or emergency room at once. NOTE: This medicine is only for you. Do not share this medicine with others. What if I miss a dose? It is important not to miss a dose. Call your doctor or health careprofessional if you are unable to keep an appointment. What may interact with this medication? medicines for seizures medicines to increase blood counts like filgrastim, pegfilgrastim, sargramostim some antibiotics like amikacin, gentamicin, neomycin, streptomycin, tobramycin vaccines Talk to your doctor or health care professional before taking any of thesemedicines: acetaminophen aspirin ibuprofen ketoprofen naproxen This list may not describe all possible interactions. Give your health care provider a list of all the medicines, herbs, non-prescription drugs, or dietary supplements you use. Also tell them if you smoke, drink alcohol, or use illegaldrugs. Some items may interact with your medicine. What should I watch for while using this medication? Your condition will be monitored carefully while you are receiving this medicine. You will need important blood work done while you are taking thismedicine. This drug may make you feel generally unwell. This is not uncommon, as chemotherapy can affect healthy cells as well as cancer cells. Report any side effects. Continue your course of treatment even though you feel ill unless yourdoctor tells you to stop. In some cases, you may be given additional medicines to help with side effects.Follow all directions for their use. Call your doctor or health care professional for advice if you get a fever, chills or sore throat, or other symptoms of a cold or  flu. Do not treat yourself. This drug decreases your body's ability to fight infections. Try toavoid being around people who are sick. This medicine may increase your risk to bruise or bleed. Call your doctor orhealth care professional if you notice any unusual bleeding. Be careful brushing and flossing your teeth or using a toothpick because you may get an infection or bleed more easily. If you have any dental work done,tell your dentist you are receiving this medicine. Avoid taking products that contain aspirin, acetaminophen, ibuprofen, naproxen, or ketoprofen unless instructed by your doctor. These medicines may hide afever. Do not become pregnant while taking this medicine. Women should inform their doctor if they wish to become pregnant or think they might be pregnant. There is a potential for serious side effects to an unborn child. Talk to your health care professional or pharmacist for more information. Do not breast-feed aninfant while taking this medicine. What side effects may I notice from receiving this medication? Side effects that you should report to your doctor or health care professionalas soon as possible: allergic reactions like skin rash, itching or hives, swelling of the face, lips, or tongue signs of infection - fever or chills,  cough, sore throat, pain or difficulty passing urine signs of decreased platelets or bleeding - bruising, pinpoint red spots on the skin, black, tarry stools, nosebleeds signs of decreased red blood cells - unusually weak or tired, fainting spells, lightheadedness breathing problems changes in hearing changes in vision chest pain high blood pressure low blood counts - This drug may decrease the number of white blood cells, red blood cells and platelets. You may be at increased risk for infections and bleeding. nausea and vomiting pain, swelling, redness or irritation at the injection site pain, tingling, numbness in the hands or feet problems with  balance, talking, walking trouble passing urine or change in the amount of urine Side effects that usually do not require medical attention (report to yourdoctor or health care professional if they continue or are bothersome): hair loss loss of appetite metallic taste in the mouth or changes in taste This list may not describe all possible side effects. Call your doctor for medical advice about side effects. You may report side effects to FDA at1-800-FDA-1088. Where should I keep my medication? This drug is given in a hospital or clinic and will not be stored at home. NOTE: This sheet is a summary. It may not cover all possible information. If you have questions about this medicine, talk to your doctor, pharmacist, orhealth care provider.  2022 Elsevier/Gold Standard (2008-01-23 14:38:05)

## 2021-06-25 ENCOUNTER — Telehealth: Payer: Self-pay | Admitting: *Deleted

## 2021-06-25 LAB — T4: T4, Total: 6.9 ug/dL (ref 4.5–12.0)

## 2021-06-29 ENCOUNTER — Other Ambulatory Visit: Payer: Self-pay | Admitting: General Surgery

## 2021-06-29 ENCOUNTER — Encounter: Payer: Self-pay | Admitting: *Deleted

## 2021-06-29 ENCOUNTER — Ambulatory Visit
Admission: RE | Admit: 2021-06-29 | Discharge: 2021-06-29 | Disposition: A | Discharge status: Home / Self Care | Payer: BC Managed Care – PPO | Source: Ambulatory Visit | Attending: General Surgery | Admitting: General Surgery

## 2021-06-29 ENCOUNTER — Ambulatory Visit: Payer: BC Managed Care – PPO

## 2021-06-29 ENCOUNTER — Other Ambulatory Visit: Payer: Self-pay

## 2021-06-29 DIAGNOSIS — C50211 Malignant neoplasm of upper-inner quadrant of right female breast: Secondary | ICD-10-CM

## 2021-06-29 DIAGNOSIS — Z171 Estrogen receptor negative status [ER-]: Secondary | ICD-10-CM

## 2021-06-29 IMAGING — MR MR BREAST*R* WO/W CM
6 of 8 series · 34 of 48 positions shown · IV contrast (9 ml Gadavist)
Comparison: Previous exam(s).

CLINICAL DATA: 60-year-old female with recently diagnosed right
breast cancer presents for MRI guided biopsy of additional non-mass
enhancement seen anterior to the primary malignancy on recent MRI.

LABS:  None performed on site.
EXAM:
MR OF THE RIGHT BREAST WITH AND WITHOUT CONTRAST
TECHNIQUE: Multiplanar, multisequence MR images of the right breast were
obtained prior to and following the intravenous administration of 9
ml of Gadavist.

[Series 2: fiducial unilateral · sagittal · 2.0mm · 1.33mm/px · 1 of 60 slices shown]
[im 1/60]
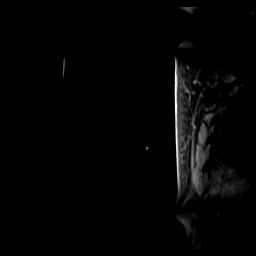

[Series 3: dynamic pre · axial · non-contrast · 1.3mm · 0.73mm/px · z∈[-108,+161]mm · 6 of 208 slices shown]
[im 1/208]
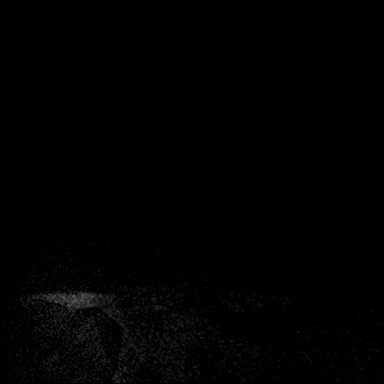
[im 42/208]
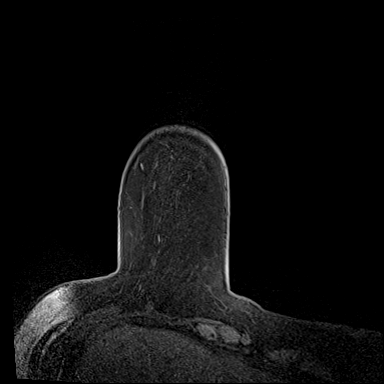
[im 83/208]
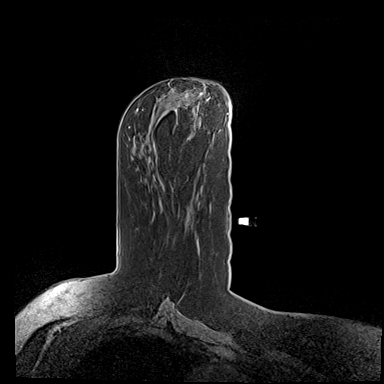
[im 125/208]
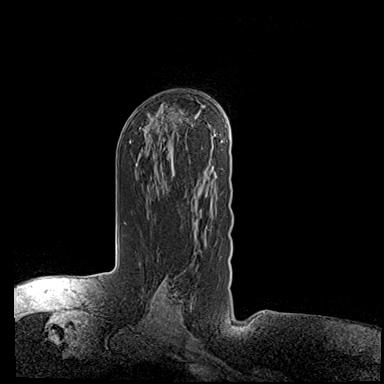
[im 166/208]
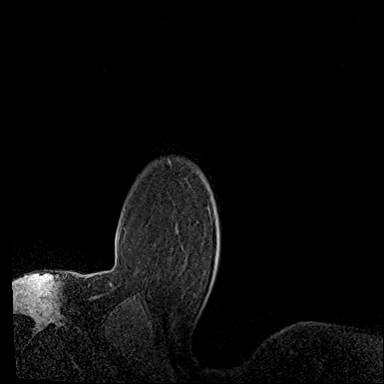
[im 208/208]
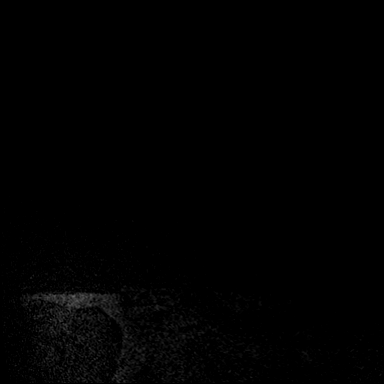

[Series 4: dynamic post 20 · axial · 1.3mm · 0.73mm/px · z∈[-108,+161]mm · 6 of 208 slices shown (1 of 2)]
[im 1/208]
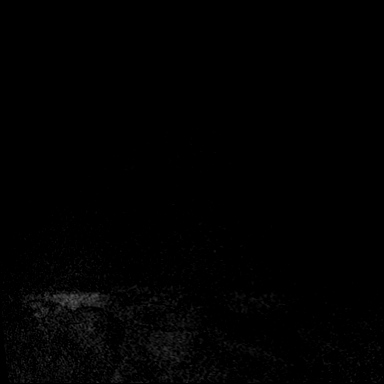
[im 42/208]
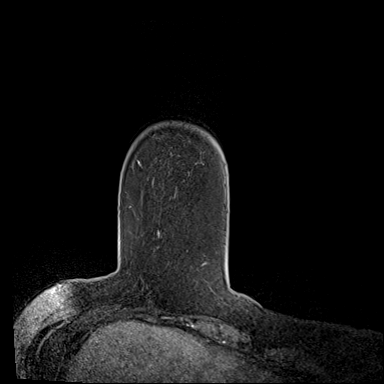
[im 83/208]
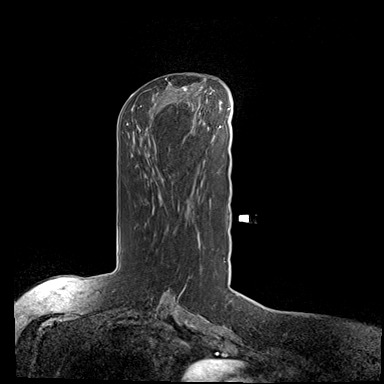
[im 125/208]
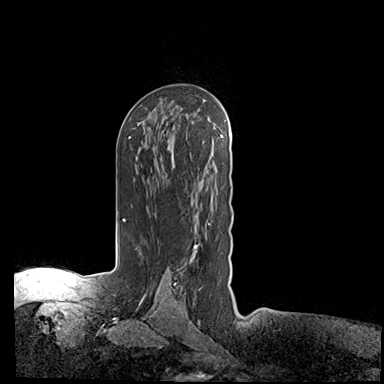
[im 166/208]
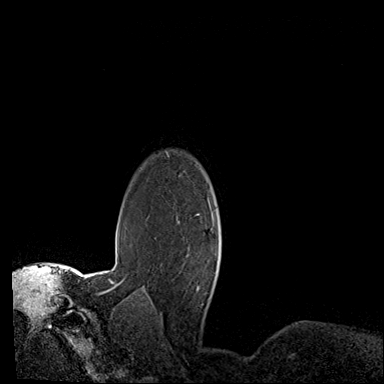
[im 208/208]
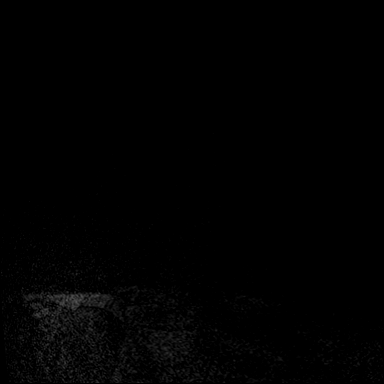

[Series 5: dynamic post 20 · axial · 1.3mm · 0.73mm/px · z∈[-108,+161]mm · 7 of 208 slices shown (2 of 2)]
[im 1/208]
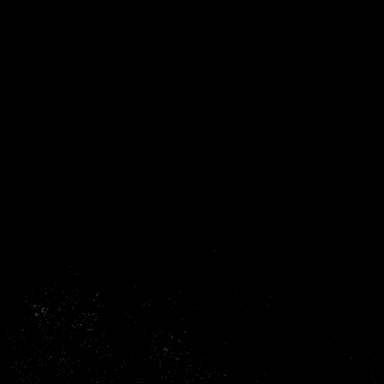
[im 35/208]
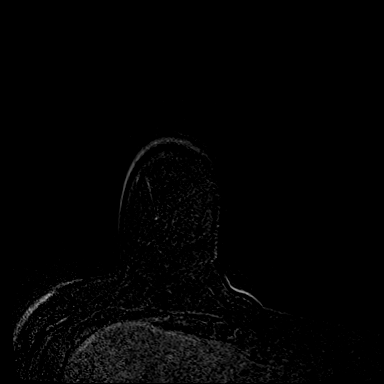
[im 70/208]
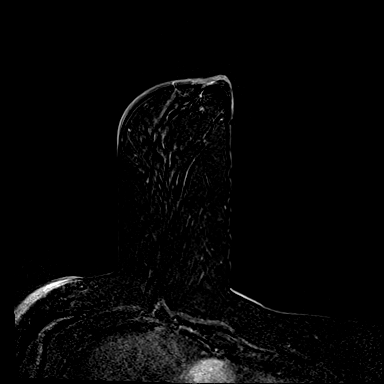
[im 104/208]
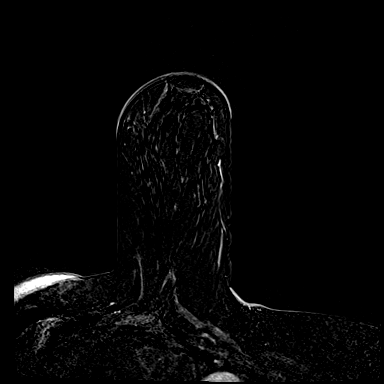
[im 139/208]
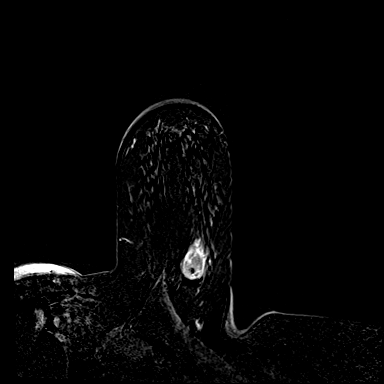
[im 173/208]
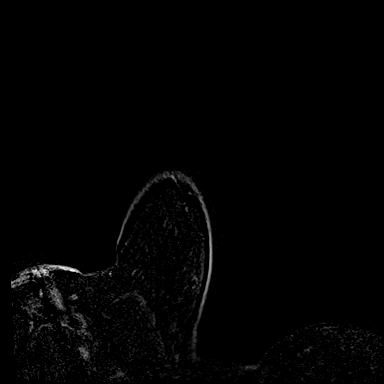
[im 208/208]
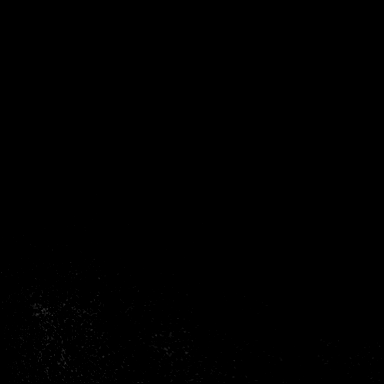

[Series 6: dynamic post 3 · axial · 1.3mm · 0.73mm/px · z∈[-108,+161]mm · 7 of 208 slices shown (1 of 2)]
[im 1/208]
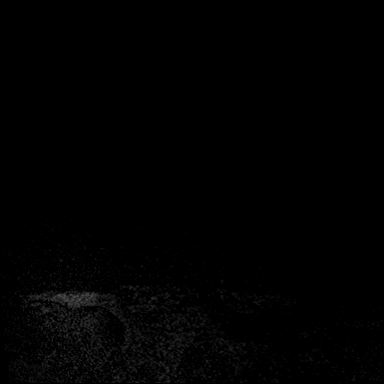
[im 35/208]
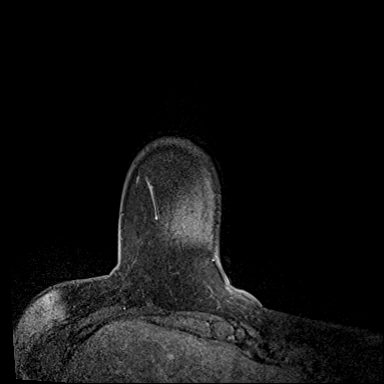
[im 70/208]
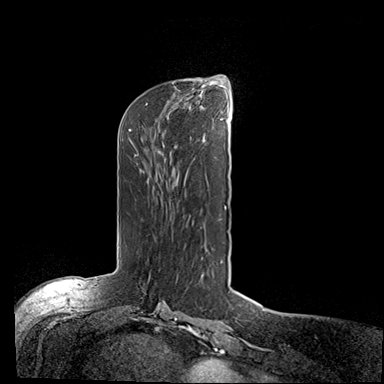
[im 104/208]
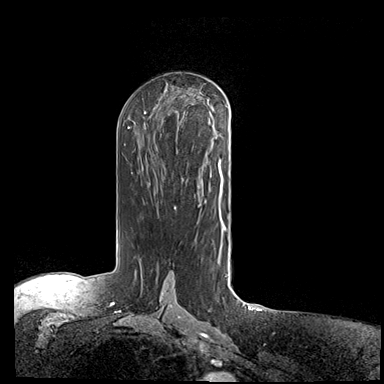
[im 139/208]
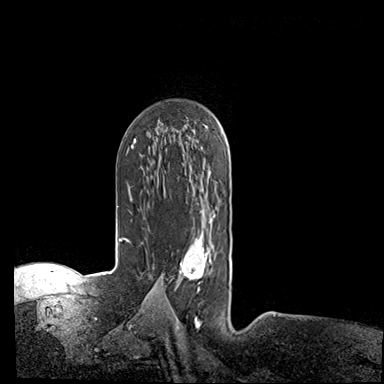
[im 173/208]
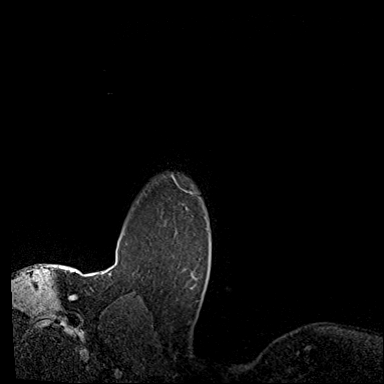
[im 208/208]
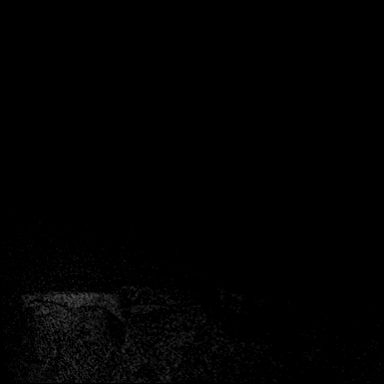

[Series 7: dynamic post 3 · axial · 1.3mm · 0.73mm/px · z∈[-108,+161]mm · 7 of 208 slices shown (2 of 2)]
[im 1/208]
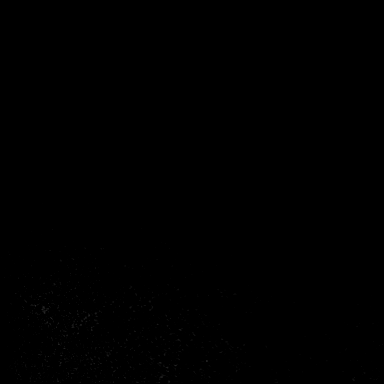
[im 35/208]
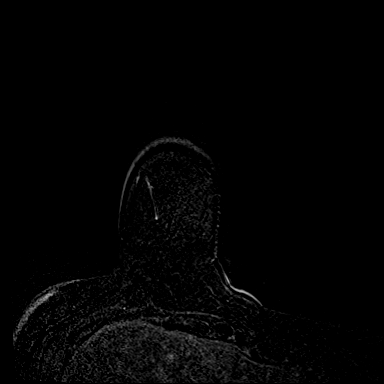
[im 70/208]
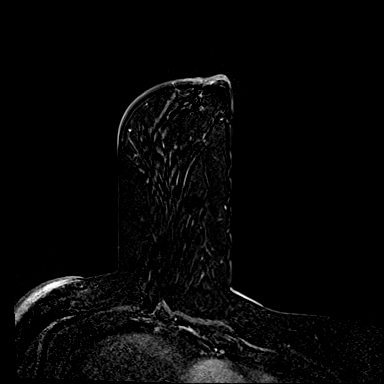
[im 104/208]
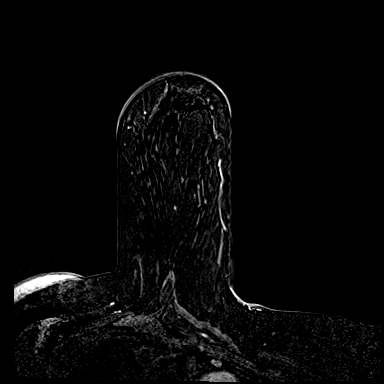
[im 139/208]
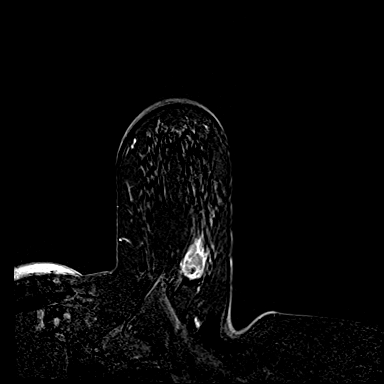
[im 173/208]
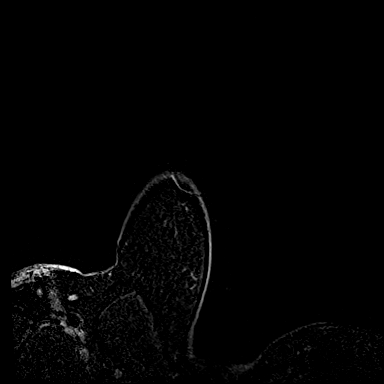
[im 208/208]
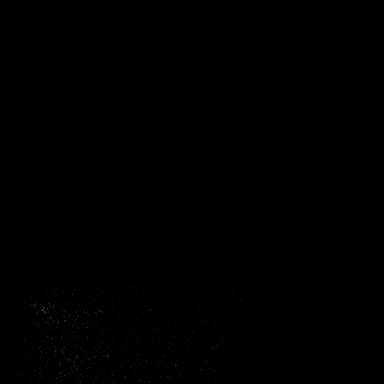

[34 of 48 positions shown; findings below may reference images not displayed]

Three-dimensional MR images were rendered by post-processing of the
original MR data on an independent workstation. The
three-dimensional MR images were interpreted, and findings are
reported in the following complete MRI report for this study. Three
dimensional images were evaluated at the independent [employer]
workstation.
FINDINGS: Breast composition: c. Heterogeneous fibroglandular tissue.

Background parenchymal enhancement: Minimal.

Right breast: Susceptibility artifact from post biopsy marking clip
is seen in association with an enhancing mass measuring up to 3.3 cm
in the upper outer quadrant posteriorly. No associated non mass
enhancement is seen on today's study surrounding the mass or
extending from it. Previously demonstrated non-mass enhancement they
have been related to post biopsy changes.

Ancillary findings:  None.
IMPRESSION: Interval resolution of non-mass enhancement extending from the
patient's biopsy proven right breast malignancy. The findings may
have been related to post biopsy changes.

RECOMMENDATION:
Per clinical treatment plan.

BI-RADS CATEGORY  6: Known biopsy-proven malignancy.

## 2021-06-29 MED ORDER — GADOBUTROL 1 MMOL/ML IV SOLN
9.0000 mL | Freq: Once | INTRAVENOUS | Status: AC | PRN
  Administered 2021-06-29: 9 mL via INTRAVENOUS

## 2021-06-29 NOTE — Progress Notes (Signed)
[place] [facility] OFFICE PROGRESS NOTE  Rita Lucks, PA-C [address] [place] [address]  DIAGNOSIS: Functionally triple negative breast cancer  CURRENT THERAPY: Neoadjuvant chemotherapy/checkpoint inhibitor therapy  INTERVAL HISTORY: Rita Davis 60 y.o. female returns to the clinic today for a follow-up visit.  The patient is feeling well today without any concerning complaints. She underwent her first cycle of neoadjuvant chemotherapy and immunotherapy last week and tolerated fairly well without any concerning adverse side effects. She denies any fever, chills, night sweats, or unexplained weight loss.  She denies any pain.  She denies any rashes or skin changes.  She denies any chest pain, shortness of breath, cough, or hemoptysis.  She denies any signs and symptoms of infection.  She denies an peripheral neuropathy.  She denies any nausea, vomiting, or diarrhea. She notes she has had hard "pellet" stools. She also has been trying to be active and drinking a lot of water. The patient is here today for evaluation and repeat blood work before starting day 8.  HISTORY OF CURRENT ILLNESS: From the original intake note:   Shakaya herself noted a mass in her right breast.  She brought it to medical attention and her mammogram was moved up to 06/03/2021 at the breast center.  This found the breast to be density category C.  On physical exam there was a palpable firm fixed mass in the upper inner quadrant of the right breast.  On the mammogram there was an irregular mass with spiculated borders which on ultrasound measured 2.9 cm at the 1 o'clock position 10 cm from the nipple.  The right axilla was sonographically benign.   Biopsy of the right breast mass in question 06/03/2021 showed an invasive ductal carcinoma, grade 3, estrogen receptor "positive" at 5% with weak staining intensity (functionally estrogen receptor negative), progesterone receptor negative, HER2  equivocal by immunotherapy but negative by FISH, with a proliferation marker of 95%.   Cancer Staging Malignant neoplasm of upper-inner quadrant of right breast in female, estrogen receptor negative ([place]) Staging form: Breast, AJCC 8th Edition - Clinical: Stage IIB (cT2, cN0, cM0, G3, ER-, PR-, HER2-) - Signed by Chauncey Cruel, MD on 06/09/2021   The patient's subsequent history is as detailed below   MEDICAL HISTORY: Past Medical History:  Diagnosis Date   Anemia    had a fibroid tumor, was anemic at that time   Anxiety    Cancer [facility])    breast cancer   Family history of breast cancer    Family history of colon cancer    Family history of prostate cancer    Hyperlipidemia    Hypertension    Pneumonia    as a child   Pre-diabetes     ALLERGIES:  has No Known Allergies.  MEDICATIONS:  Current Outpatient Medications  Medication Sig Dispense Refill   amLODipine-olmesartan (AZOR) 5-40 MG tablet Take 1 tablet by mouth in the morning.     betamethasone valerate (VALISONE) 0.1 % cream Apply 1 application topically daily as needed (skin irritation.).     bimatoprost (LATISSE) 0.03 % ophthalmic solution Place one drop on applicator and apply evenly along the skin of the upper eyelid at base of eyelashes once daily at bedtime; repeat procedure for second eye (use a clean applicator).     Calcium-Magnesium-Zinc (CAL-MAG-ZINC PO) Take 1 tablet by mouth every other day. In the morning     Cholecalciferol (VITAMIN D3) 250 MCG (10000 UT) TABS Take 10,000 Units by  mouth daily.     dexamethasone (DECADRON) 4 MG tablet Take 2 tablets once a day for 3 days after carboplatin and AC chemotherapy. Take with food. 30 tablet 1   escitalopram (LEXAPRO) 10 MG tablet Take 10 mg by mouth at bedtime.     lidocaine-prilocaine (EMLA) cream Apply to affected area once 30 g 3   Misc Natural Products (SAMBUCUS ELDERBERRY IMMUNE PO) Take 5 mLs by mouth daily.     Misc Natural Products (SUPER GREENS) POWD  Take 1 Scoop by mouth daily.     oxyCODONE (OXY IR/ROXICODONE) 5 MG immediate release tablet Take 1 tablet (5 mg total) by mouth every 6 (six) hours as needed for severe pain. 5 tablet 0   pravastatin (PRAVACHOL) 20 MG tablet Take 20 mg by mouth once a week.     prochlorperazine (COMPAZINE) 10 MG tablet Take 1 tablet (10 mg total) by mouth every 6 (six) hours as needed (Nausea or vomiting). 30 tablet 1   No current facility-administered medications for this visit.   Facility-Administered Medications Ordered in Other Visits  Medication Dose Route Frequency Provider Last Rate Last Admin   dexamethasone (DECADRON) 10 mg in sodium chloride 0.9 % 50 mL IVPB  10 mg Intravenous Once Magrinat, Virgie Dad, MD       diphenhydrAMINE (BENADRYL) injection 25 mg  25 mg Intravenous Once Magrinat, Virgie Dad, MD       famotidine (PEPCID) IVPB 20 mg in NS 100 mL IVPB  20 mg Intravenous Once Magrinat, Virgie Dad, MD       heparin lock flush 100 unit/mL  500 Units Intracatheter Once PRN Magrinat, Virgie Dad, MD       PACLitaxel (TAXOL) 168 mg in sodium chloride 0.9 % 250 mL chemo infusion (</= 69m/m2)  80 mg/m2 (Treatment Plan Recorded) Intravenous Once Magrinat, GVirgie Dad MD       sodium chloride flush (NS) 0.9 % injection 10 mL  10 mL Intracatheter PRN Magrinat, GVirgie Dad MD        SURGICAL HISTORY:  Past Surgical History:  Procedure Laterality Date   ABDOMINAL HYSTERECTOMY     still has ovaries   BREAST BIOPSY Right 05/23/2012   BREAST CYST EXCISION Right    cyst removed     from lower back   DILATION AND CURETTAGE OF UTERUS     ORIF ANKLE FRACTURE Left 11/13/2020   Procedure: OPEN TREATMENT OF LEFT TRIMALLEOLAR ANKLE FRACTURE WITH POSTERIOR FIXATION, SYNDESMOSIS;  Surgeon: AErle Crocker MD;  Location: [facility]  Service: Orthopedics;  Laterality: Left;  LENGTH OF SURGERY: 1.5 HOURS   PORTACATH PLACEMENT Left 06/18/2021   Procedure: PORT PLACEMENT;  Surgeon: BStark Klein MD;   Location: [facility] OR;  Service: General;  Laterality: Left;   gynecologic cancer (MGM's mother)  The patient's father died at the age of 678 having had breast cancer twice.  The patient has little information on his side of the family.  The patient's mother is 8103years old as of August 2022.  She is currently under hospice care for metastatic breast cancer having refused active treatment (she was evaluated by Dr. GLindi Adie.  Also on the maternal side there is a great grandmother with ovarian cancer.  The patient had one half brother who died from colon cancer at the age of 465  Another half brother survives.     GYNECOLOGIC HISTORY:  No LMP recorded. Patient has had a hysterectomy. Menarche: 60years old Age at first live birth:  60 years old [place] P 2 LMP January 14, 2015 Contraceptive several years with no complications HRT no Hysterectomy?  Yes Salpingo-oophorectomy?no     SOCIAL HISTORY:  [name] retired as Psychologist, counselling in 2020/01/15.  Her husband died 03/30/2020 from a cardiac arrest in the setting of renal failure.  The patient lives by herself with no pets.  Son Timmothy Sours graduated from [employer] and works for [employer] in [place].  Son Lovena Le graduated from [employer] with a business administration degree and placed in a band                          ADVANCED DIRECTIVES: Not in place.  As of 06/09/2021 the patient has not discussed her diagnosis of breast cancer with her children.    REVIEW OF SYSTEMS:   Review of Systems  Constitutional: Negative for appetite change, chills, fatigue, fever and unexpeced weight change.  HENT:   Negative for mouth sores, nosebleeds, sore throat and trouble swallowing.   Eyes: Negative for eye problems and icterus.  Respiratory: Negative for cough, hemoptysis, shortness of breath and wheezing.   Cardiovascular: Negative for chest pain and leg swelling.  Gastrointestinal: Positive for hard stools. Negative for abdominal pain, diarrhea, nausea and vomiting.   Genitourinary: Negative for bladder incontinence, difficulty urinating, dysuria, frequency and hematuria.   Musculoskeletal: Negative for back pain, gait problem, neck pain and neck stiffness.  Skin: Negative for itching and rash.  Neurological: Negative for dizziness, extremity weakness, gait problem, headaches, light-headedness and seizures.  Hematological: Negative for adenopathy. Does not bruise/bleed easily.  Psychiatric/Behavioral: Negative for confusion, depression and sleep disturbance. The patient is not nervous/anxious.     PHYSICAL EXAMINATION:  Blood pressure 135/78, pulse 79, temperature 98.2 F (36.8 C), temperature source Oral, resp. rate 16, height _0  (1.727 m), weight 206 lb 6.4 oz (93.6 kg), SpO2 98 %.  ECOG PERFORMANCE STATUS: 0-1  Physical Exam  Constitutional: Oriented to person, place, and time and well-developed, well-nourished, and in no distress.    HENT:  Head: Normocephalic and atraumatic.  Mouth/Throat: Oropharynx is clear and moist. No oropharyngeal exudate.  Eyes: Conjunctivae are normal. Right eye exhibits no discharge. Left eye exhibits no discharge. No scleral icterus.  Neck: Normal range of motion. Neck supple.  Cardiovascular: Normal rate, regular rhythm, normal heart sounds and intact distal pulses.   Pulmonary/Chest: Effort normal and breath sounds normal. No respiratory distress. No wheezes. No rales.  Abdominal: Soft. Bowel sounds are normal. Exhibits no distension and no mass. There is no tenderness.  Musculoskeletal: Normal range of motion. Exhibits no edema.  Lymphadenopathy:    No cervical adenopathy.  Neurological: Alert and oriented to person, place, and time. Exhibits normal muscle tone. Gait normal. Coordination normal.  Skin: Skin is warm and dry. No rash noted. Not diaphoretic. No erythema. No pallor.  Psychiatric: Mood, memory and judgment normal.  Breast: Breast mass in 1'oclock position. No nipple discharge, inversion, or  overlying skin changes.  Vitals reviewed.  LABORATORY DATA: Lab Results  Component Value Date   WBC 5.3 07/01/2021   HGB 11.9 (L) 07/01/2021   HCT 35.0 (L) 07/01/2021   MCV 88.4 07/01/2021   PLT 215 07/01/2021      Chemistry      Component Value Date/Time   NA 137 07/01/2021 0817   K 3.8 07/01/2021 0817   CL 103 07/01/2021 0817   CO2 26 07/01/2021 0817   BUN 16 07/01/2021 0817   CREATININE  0.96 07/01/2021 0817      Component Value Date/Time   CALCIUM 9.0 07/01/2021 0817   ALKPHOS 94 07/01/2021 0817   AST 18 07/01/2021 0817   ALT 33 07/01/2021 0817   BILITOT 0.3 07/01/2021 0817       RADIOGRAPHIC STUDIES:  MR BREAST RIGHT W WO CONTRAST INC CAD  Result Date: 06/29/2021 CLINICAL DATA:  60 year old female with recently diagnosed right breast cancer presents for MRI guided biopsy of additional non-mass enhancement seen anterior to the primary malignancy on recent MRI. LABS:  None performed on site. EXAM: MR OF THE RIGHT BREAST WITH AND WITHOUT CONTRAST TECHNIQUE: Multiplanar, multisequence MR images of the right breast were obtained prior to and following the intravenous administration of 9 ml of Gadavist. Three-dimensional MR images were rendered by post-processing of the original MR data on an independent workstation. The three-dimensional MR images were interpreted, and findings are reported in the following complete MRI report for this study. Three dimensional images were evaluated at the independent DynaCad workstation. COMPARISON:  Previous exam(s). FINDINGS: Breast composition: c. Heterogeneous fibroglandular tissue. Background parenchymal enhancement: Minimal. Right breast: Susceptibility artifact from post biopsy marking clip is seen in association with an enhancing mass measuring up to 3.3 cm in the upper outer quadrant posteriorly. No associated non mass enhancement is seen on today's study surrounding the mass or extending from it. Previously demonstrated non-mass  enhancement they have been related to post biopsy changes. Ancillary findings:  None. IMPRESSION: Interval resolution of non-mass enhancement extending from the patient's biopsy proven right breast malignancy. The findings may have been related to post biopsy changes. RECOMMENDATION: Per clinical treatment plan. BI-RADS CATEGORY  6: Known biopsy-proven malignancy. Electronically Signed   By: Kristopher Oppenheim M.D.   On: 06/29/2021 10:33  MR BREAST BILATERAL W WO CONTRAST INC CAD  Result Date: 06/15/2021 CLINICAL DATA:  60 year old female with biopsy proven grade 3 invasive ductal carcinoma of the right breast at 1 o'clock (coil clip). She has a ribbon clip in the lower-inner quadrant of the right breast from a biopsy of a benign fibroadenoma from 2013. She presents for MRI for initial staging. She has family history of breast cancer in her mother at age 16. LABS:  Creatinine of 1.24 mg per dL and GFR of 50 on 06/09/2021. EXAM: BILATERAL BREAST MRI WITH AND WITHOUT CONTRAST TECHNIQUE: Multiplanar, multisequence MR images of both breasts were obtained prior to and following the intravenous administration of 9 ml of Gadavist Three-dimensional MR images were rendered by post-processing of the original MR data on an independent workstation. The three-dimensional MR images were interpreted, and findings are reported in the following complete MRI report for this study. Three dimensional images were evaluated at the independent interpreting workstation using the DynaCAD thin client. COMPARISON:  No prior MRI available for comparison. Correlation made with prior mammograms and ultrasound images. FINDINGS: Breast composition: c. Heterogeneous fibroglandular tissue. Background parenchymal enhancement: Minimal Right breast: The biopsy-proven cancer in the upper inner posterior right breast is identified, with susceptibility artifact centrally located corresponding with the biopsy marking clip. The mass itself measures  approximately 3.3 cm by MRI (2.9 cm with ultrasound). There is some additional non mass enhancement extending anteriorly from the mass, all together measuring 5.0 cm (series 12, image 37). No additional suspicious areas of enhancement are found in the right breast. Left breast: No mass or abnormal enhancement. Lymph nodes: No abnormal appearing lymph nodes. Ancillary findings:  None. IMPRESSION: 1. The biopsied proven malignancy in the upper  inner right breast measures 3.3 cm. There is indeterminate non mass enhancement extending anteriorly from the mass, which all together spans approximately 5.0 cm. 2.  No MRI evidence of left breast malignancy. RECOMMENDATION: 1. If it will change clinical management, MRI guided biopsy of the anterior margin of the non mass enhancement in the right breast could be performed. Otherwise, continue treatment plan. BI-RADS CATEGORY  6: Known biopsy-proven malignancy. Electronically Signed   By: Ammie Ferrier M.D.   On: 06/15/2021 10:42  DG CHEST PORT 1 VIEW  Result Date: 06/18/2021 CLINICAL DATA:  Port-A-Cath placement EXAM: PORTABLE CHEST 1 VIEW COMPARISON:  None. FINDINGS: Interval placement of a left chest port with catheter tip overlying the superior cavoatrial junction. The heart size and mediastinal contours are within normal limits. No focal pulmonary opacity. No pleural effusion or pneumothorax. The visualized skeletal structures are unremarkable. IMPRESSION: Status post placement of a left chest port.  Otherwise normal chest. Electronically Signed   By: Merilyn Baba M.D.   On: 06/18/2021 17:38   DG Fluoro Guide CV Line-No Report  Result Date: 06/18/2021 Fluoroscopy was utilized by the requesting physician.  No radiographic interpretation.   US BREAST LTD UNI RIGHT INC AXILLA  Addendum Date: 06/04/2021   ADDENDUM REPORT: 06/04/2021 08:38 ADDENDUM: This is an addendum to the report dictated on 06/03/2021. The all caps section should read: On physical exam I  palpate a firm, fixed mass in the upper inner quadrant of the RIGHT breast. The positive findings on this study were in the right breast. Electronically Signed   By: Kristopher Oppenheim M.D.   On: 06/04/2021 08:38   Result Date: 06/04/2021 CLINICAL DATA:  60 year old female with a palpable right breast lump for 2 weeks. EXAM: DIGITAL DIAGNOSTIC BILATERAL MAMMOGRAM WITH TOMOSYNTHESIS AND CAD; ULTRASOUND RIGHT BREAST LIMITED TECHNIQUE: Bilateral digital diagnostic mammography and breast tomosynthesis was performed. The images were evaluated with computer-aided detection.; Targeted ultrasound examination of the right breast was performed COMPARISON:  Previous exam(s). ACR Breast Density Category c: The breast tissue is heterogeneously dense, which may obscure small masses. FINDINGS: A radiopaque BB was placed at the site of the patient's palpable lump in the upper inner quadrant of the right breast. An irregular mass with spiculated borders is demonstrated just deep to the radiopaque BB. Otherwise, no new or suspicious findings in the remainder of the right breast. Additional mammographic evaluation of the left breast demonstrates no focal or suspicious abnormality. The parenchymal pattern is stable. On physical exam, I palpate a firm, fixed mass in the upper inner quadrant of the left breast. Targeted ultrasound is performed, showing an irregular hypoechoic mass with associated vascularity at the 1 o'clock position 10 cm from the nipple. It measures 2.9 x 1.6 x 1.7 cm. This correlates with the mammographic finding. Evaluation of the right axilla demonstrates no suspicious lymphadenopathy. IMPRESSION: 1. Suspicious right breast mass corresponding with the patient's palpable lump. Recommendation is for ultrasound-guided biopsy. 2. No suspicious right axillary lymphadenopathy. 3. No mammographic evidence of malignancy on the left. RECOMMENDATION: Ultrasound-guided biopsy of the right breast. I have discussed the findings and  recommendations with the patient. If applicable, a reminder letter will be sent to the patient regarding the next appointment. BI-RADS CATEGORY  5: Highly suggestive of malignancy. Electronically Signed: By: Kristopher Oppenheim M.D. On: 06/03/2021 14:38  MM DIAG BREAST TOMO BILATERAL  Addendum Date: 06/04/2021   ADDENDUM REPORT: 06/04/2021 08:38 ADDENDUM: This is an addendum to the report dictated on 06/03/2021. The all  caps section should read: On physical exam I palpate a firm, fixed mass in the upper inner quadrant of the RIGHT breast. The positive findings on this study were in the right breast. Electronically Signed   By: Kristopher Oppenheim M.D.   On: 06/04/2021 08:38   Result Date: 06/04/2021 CLINICAL DATA:  60 year old female with a palpable right breast lump for 2 weeks. EXAM: DIGITAL DIAGNOSTIC BILATERAL MAMMOGRAM WITH TOMOSYNTHESIS AND CAD; ULTRASOUND RIGHT BREAST LIMITED TECHNIQUE: Bilateral digital diagnostic mammography and breast tomosynthesis was performed. The images were evaluated with computer-aided detection.; Targeted ultrasound examination of the right breast was performed COMPARISON:  Previous exam(s). ACR Breast Density Category c: The breast tissue is heterogeneously dense, which may obscure small masses. FINDINGS: A radiopaque BB was placed at the site of the patient's palpable lump in the upper inner quadrant of the right breast. An irregular mass with spiculated borders is demonstrated just deep to the radiopaque BB. Otherwise, no new or suspicious findings in the remainder of the right breast. Additional mammographic evaluation of the left breast demonstrates no focal or suspicious abnormality. The parenchymal pattern is stable. On physical exam, I palpate a firm, fixed mass in the upper inner quadrant of the left breast. Targeted ultrasound is performed, showing an irregular hypoechoic mass with associated vascularity at the 1 o'clock position 10 cm from the nipple. It measures 2.9 x 1.6 x  1.7 cm. This correlates with the mammographic finding. Evaluation of the right axilla demonstrates no suspicious lymphadenopathy. IMPRESSION: 1. Suspicious right breast mass corresponding with the patient's palpable lump. Recommendation is for ultrasound-guided biopsy. 2. No suspicious right axillary lymphadenopathy. 3. No mammographic evidence of malignancy on the left. RECOMMENDATION: Ultrasound-guided biopsy of the right breast. I have discussed the findings and recommendations with the patient. If applicable, a reminder letter will be sent to the patient regarding the next appointment. BI-RADS CATEGORY  5: Highly suggestive of malignancy. Electronically Signed: By: Kristopher Oppenheim M.D. On: 06/03/2021 14:38  ECHOCARDIOGRAM COMPLETE  Result Date: 06/18/2021    ECHOCARDIOGRAM REPORT   Patient Name:   DINITA MIGLIACCIO Date of Exam: 06/18/2021 Medical Rec #:  [id number]       Height:       68.0 in Accession #:    [id number]      Weight:       201.6 lb Date of Birth:  1961-05-21       BSA:          2.051 m Patient Age:    54 years        BP:           149/77 mmHg Patient Gender: F               HR:           56 bpm. Exam Location:  Outpatient Procedure: 2D Echo, Color Doppler, Cardiac Doppler and Strain Analysis Indications:    Chemo Z09  History:        Patient has no prior history of Echocardiogram examinations.                 Risk Factors:Dyslipidemia and Hypertension.  Sonographer:    Bernadene Person RDCS Referring Phys: [place]  1. Left ventricular ejection fraction, by estimation, is 55 to 60%. The left ventricle has normal function. The left ventricle has no regional wall motion abnormalities. Left ventricular diastolic parameters were normal. The average left ventricular global longitudinal strain is -21.1 %. The global  longitudinal strain is normal.  2. Right ventricular systolic function is normal. The right ventricular size is normal.  3. The mitral valve is normal in structure.  Trivial mitral valve regurgitation. No evidence of mitral stenosis.  4. The aortic valve is normal in structure. There is mild calcification of the aortic valve. Aortic valve regurgitation is not visualized. Mild aortic valve sclerosis is present, with no evidence of aortic valve stenosis.  5. The inferior vena cava is normal in size with greater than 50% respiratory variability, suggesting right atrial pressure of 3 mmHg. FINDINGS  Left Ventricle: Left ventricular ejection fraction, by estimation, is 55 to 60%. The left ventricle has normal function. The left ventricle has no regional wall motion abnormalities. The average left ventricular global longitudinal strain is -21.1 %. The global longitudinal strain is normal. The left ventricular internal cavity size was normal in size. There is no left ventricular hypertrophy. Left ventricular diastolic parameters were normal. Right Ventricle: The right ventricular size is normal. No increase in right ventricular wall thickness. Right ventricular systolic function is normal. Left Atrium: Left atrial size was normal in size. Right Atrium: Right atrial size was normal in size. Pericardium: There is no evidence of pericardial effusion. Mitral Valve: The mitral valve is normal in structure. Trivial mitral valve regurgitation. No evidence of mitral valve stenosis. Tricuspid Valve: The tricuspid valve is normal in structure. Tricuspid valve regurgitation is not demonstrated. No evidence of tricuspid stenosis. Aortic Valve: The aortic valve is normal in structure. There is mild calcification of the aortic valve. Aortic valve regurgitation is not visualized. Mild aortic valve sclerosis is present, with no evidence of aortic valve stenosis. Pulmonic Valve: The pulmonic valve was normal in structure. Pulmonic valve regurgitation is trivial. No evidence of pulmonic stenosis. Aorta: The aortic root is normal in size and structure. Venous: The inferior vena cava is normal in size  with greater than 50% respiratory variability, suggesting right atrial pressure of 3 mmHg. IAS/Shunts: No atrial level shunt detected by color flow Doppler.  LEFT VENTRICLE PLAX 2D LVIDd:         4.60 cm  Diastology LVIDs:         2.70 cm  LV e' medial:    6.12 cm/s LV PW:         1.10 cm  LV E/e' medial:  11.5 LV IVS:        1.00 cm  LV e' lateral:   8.66 cm/s LVOT diam:     2.00 cm  LV E/e' lateral: 8.2 LV SV:         68 LV SV Index:   33       2D Longitudinal Strain LVOT Area:     3.14 cm 2D Strain GLS Avg:     -21.1 %  RIGHT VENTRICLE RV S prime:     9.00 cm/s TAPSE (M-mode): 1.8 cm LEFT ATRIUM             Index       RIGHT ATRIUM           Index LA diam:        3.00 cm 1.46 cm/m  RA Area:     11.60 cm LA Vol (A2C):   42.0 ml 20.48 ml/m RA Volume:   19.70 ml  9.61 ml/m LA Vol (A4C):   35.9 ml 17.51 ml/m LA Biplane Vol: 39.5 ml 19.26 ml/m  AORTIC VALVE LVOT Vmax:   98.60 cm/s LVOT Vmean:  62.900 cm/s LVOT  VTI:    0.218 m  AORTA Ao Root diam: 3.40 cm Ao Asc diam:  3.20 cm MITRAL VALVE MV Area (PHT): 2.91 cm    SHUNTS MV Decel Time: 261 msec    Systemic VTI:  0.22 m MV E velocity: 70.60 cm/s  Systemic Diam: 2.00 cm MV A velocity: 77.50 cm/s MV E/A ratio:  0.91 Jenkins Rouge MD Electronically signed by Jenkins Rouge MD Signature Date/Time: 06/18/2021/12:34:34 PM    Final    MM CLIP PLACEMENT RIGHT  Result Date: 06/03/2021 CLINICAL DATA:  Evaluate COIL clip placement following ultrasound-guided RIGHT breast biopsy. EXAM: 3D DIAGNOSTIC RIGHT MAMMOGRAM POST ULTRASOUND BIOPSY COMPARISON:  Previous exam(s). FINDINGS: 3D Mammographic images were obtained following ultrasound guided biopsy of the 2.9 cm mass at the 1 o'clock position of the RIGHT breast. The COIL biopsy marking clip is in expected position at the site of biopsy. IMPRESSION: Appropriate positioning of the COIL shaped biopsy marking clip at the site of biopsy in the UPPER INNER RIGHT breast. Final Assessment: Post Procedure Mammograms for Marker  Placement Electronically Signed   By: Margarette Canada M.D.   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 This was found to be concordant by Dr. Hassan Rowan. Pathology results were discussed with the patient by telephone. The patient reported doing well after the biopsy with tenderness at the site. Post biopsy instructions and care were reviewed and questions were answered. The patient was encouraged to call [facility] for any additional concerns. Surgical consultation has been arranged with Dr. Stark Klein at [facility] on June 08, 2021. Pathology results reported by Stacie Acres RN on 06/04/2021. Electronically Signed   By: Margarette Canada M.D.   On: 06/04/2021 16:50   Result Date: 06/05/2021 CLINICAL DATA:  60 year old female for tissue sampling of 2.9 cm UPPER INNER RIGHT breast mass. EXAM: ULTRASOUND GUIDED RIGHT BREAST CORE NEEDLE BIOPSY COMPARISON:  Previous exam(s). FINDINGS: I met with the patient and we discussed the procedure of ultrasound-guided biopsy, including benefits and alternatives. We discussed the high likelihood of a successful procedure. We discussed the risks of the procedure, including infection, bleeding, tissue injury, clip migration, and inadequate sampling. Informed written consent was given. The usual time-out protocol was performed immediately prior to the procedure. Lesion quadrant: UPPER INNER RIGHT breast Using sterile technique and 1% Lidocaine as local anesthetic, under direct ultrasound visualization, a 12 gauge spring-loaded device was used to perform biopsy of the 2.9 cm mass at the 1 o'clock position of the RIGHT breast 10 cm from the nipple using a LATERAL approach. At the conclusion of the procedure a COIL tissue marker clip was  deployed into the biopsy cavity. Follow up 2 view mammogram was performed and dictated separately. IMPRESSION: Ultrasound guided biopsy of 2.9 cm UPPER INNER RIGHT breast mass. No apparent complications. Electronically Signed: By: Margarette Canada M.D. On: 06/03/2021 15:01     ASSESSMENT: 60 y.o. [place] woman status post right breast upper inner quadrant biopsy 06/03/2021 for a clinical T2N0 invasive ductal carcinoma, grade 3, functionally triple negative, with an MIB-1 of 95%.   (1) genetics testing  (2) neoadjuvant chemotherapy to consist of carboplatin paclitaxel pembrolizumab for 4 cycles starting 06/23/2021 followed by doxorubicin and cyclophosphamide pembrolizumab for 4 cycles   (  3) definitive surgery to follow   (4) adjuvant radiation as appropriate  PLAN: Niara tolerated the first cycle of her treatment well without any concerning adverse side effects.  Labs were reviewed.  Recommend that she proceed with day 8 of cycle #1 today as scheduled.  She will see Dr. Jana Hakim next week as scheduled. I gave her a print out of her schedule.  Advised she can use a stool softener if her stools are hard.   She was encoruaged to continue to be active and drink plenty of water. She was asking if she can do pilates. I advised her it is ok to do pilates if she wishes but advised her to be conscientious and follow COVID precautions such as washing hands, wearing masks, and encouraged her to stay 6 feet from the person besides her. Discussed if she is concerned about her infection risk, she can always do pilates at home via workout video/Internet.   We briefly discussed the difference between triple negative breast cancer, ER positive, and HER2 positive breast cancer.   I also discussed taking Claritin for 4-7 days with her GCSF injections which will start next week.   The patient was advised to call immediately if she has any concerning symptoms in the interval. The patient voices understanding of  current disease status and treatment options and is in agreement with the current care plan. All questions were answered. The patient knows to call the clinic with any problems, questions or concerns. We can certainly see the patient much sooner if necessary     No orders of the defined types were placed in this encounter.    The total time spent in the appointment was 20-29 minutes.   Wonda Goodgame L Tiaira Arambula, PA-C 07/01/21

## 2021-06-30 MED FILL — Dexamethasone Sodium Phosphate Inj 100 MG/10ML: INTRAMUSCULAR | Qty: 1 | Status: AC

## 2021-07-01 ENCOUNTER — Other Ambulatory Visit: Payer: Self-pay

## 2021-07-01 ENCOUNTER — Inpatient Hospital Stay: Payer: BC Managed Care – PPO

## 2021-07-01 ENCOUNTER — Encounter: Payer: Self-pay | Admitting: Physician Assistant

## 2021-07-01 ENCOUNTER — Inpatient Hospital Stay: Payer: BC Managed Care – PPO | Admitting: Physician Assistant

## 2021-07-01 VITALS — BP 135/78 | HR 79 | Temp 98.2°F | Resp 16 | Ht 68.0 in | Wt 206.4 lb

## 2021-07-01 DIAGNOSIS — Z171 Estrogen receptor negative status [ER-]: Secondary | ICD-10-CM

## 2021-07-01 DIAGNOSIS — C50211 Malignant neoplasm of upper-inner quadrant of right female breast: Secondary | ICD-10-CM

## 2021-07-01 DIAGNOSIS — Z95828 Presence of other vascular implants and grafts: Secondary | ICD-10-CM

## 2021-07-01 DIAGNOSIS — Z5112 Encounter for antineoplastic immunotherapy: Secondary | ICD-10-CM | POA: Diagnosis not present

## 2021-07-01 LAB — CMP (CANCER CENTER ONLY)
ALT: 33 U/L (ref 0–44)
AST: 18 U/L (ref 15–41)
Albumin: 3.5 g/dL (ref 3.5–5.0)
Alkaline Phosphatase: 94 U/L (ref 38–126)
Anion gap: 8 (ref 5–15)
BUN: 16 mg/dL (ref 6–20)
CO2: 26 mmol/L (ref 22–32)
Calcium: 9 mg/dL (ref 8.9–10.3)
Chloride: 103 mmol/L (ref 98–111)
Creatinine: 0.96 mg/dL (ref 0.44–1.00)
GFR, Estimated: >60 mL/min (ref >60)
Glucose, Bld: 124 mg/dL — ABNORMAL HIGH (ref 70–99)
Potassium: 3.8 mmol/L (ref 3.5–5.1)
Sodium: 137 mmol/L (ref 135–145)
Total Bilirubin: 0.3 mg/dL (ref 0.3–1.2)
Total Protein: 6.6 g/dL (ref 6.5–8.1)

## 2021-07-01 LAB — CBC WITH DIFFERENTIAL (CANCER CENTER ONLY)
Abs Immature Granulocytes: 0.04 10*3/uL (ref 0.00–0.07)
Basophils Absolute: 0 10*3/uL (ref 0.0–0.1)
Basophils Relative: 0 %
Eosinophils Absolute: 0.2 10*3/uL (ref 0.0–0.5)
Eosinophils Relative: 3 %
HCT: 35 % — ABNORMAL LOW (ref 36.0–46.0)
Hemoglobin: 11.9 g/dL — ABNORMAL LOW (ref 12.0–15.0)
Immature Granulocytes: 1 %
Lymphocytes Relative: 28 %
Lymphs Abs: 1.5 10*3/uL (ref 0.7–4.0)
MCH: 30.1 pg (ref 26.0–34.0)
MCHC: 34 g/dL (ref 30.0–36.0)
MCV: 88.4 fL (ref 80.0–100.0)
Monocytes Absolute: 0.1 10*3/uL (ref 0.1–1.0)
Monocytes Relative: 3 %
Neutro Abs: 3.5 10*3/uL (ref 1.7–7.7)
Neutrophils Relative %: 65 %
Platelet Count: 215 10*3/uL (ref 150–400)
RBC: 3.96 MIL/uL (ref 3.87–5.11)
RDW: 14.3 % (ref 11.5–15.5)
WBC Count: 5.3 10*3/uL (ref 4.0–10.5)
nRBC: 0 % (ref 0.0–0.2)

## 2021-07-01 LAB — TSH: TSH: 1.415 u[IU]/mL (ref 0.308–3.960)

## 2021-07-01 MED ORDER — SODIUM CHLORIDE 0.9% FLUSH
10.0000 mL | Freq: Once | INTRAVENOUS | Status: AC
  Administered 2021-07-01: 10 mL

## 2021-07-01 MED ORDER — FAMOTIDINE 20 MG IN NS 100 ML IVPB
20.0000 mg | Freq: Once | INTRAVENOUS | Status: AC
  Administered 2021-07-01: 20 mg via INTRAVENOUS
  Filled 2021-07-01: qty 100

## 2021-07-01 MED ORDER — SODIUM CHLORIDE 0.9 % IV SOLN
10.0000 mg | Freq: Once | INTRAVENOUS | Status: AC
  Administered 2021-07-01: 10 mg via INTRAVENOUS
  Filled 2021-07-01: qty 10

## 2021-07-01 MED ORDER — HEPARIN SOD (PORK) LOCK FLUSH 100 UNIT/ML IV SOLN
500.0000 [IU] | Freq: Once | INTRAVENOUS | Status: AC | PRN
  Administered 2021-07-01: 500 [IU]

## 2021-07-01 MED ORDER — SODIUM CHLORIDE 0.9 % IV SOLN
80.0000 mg/m2 | Freq: Once | INTRAVENOUS | Status: AC
  Administered 2021-07-01: 168 mg via INTRAVENOUS
  Filled 2021-07-01: qty 28

## 2021-07-01 MED ORDER — SODIUM CHLORIDE 0.9% FLUSH
10.0000 mL | INTRAVENOUS | Status: DC | PRN
  Administered 2021-07-01: 10 mL

## 2021-07-01 MED ORDER — DIPHENHYDRAMINE HCL 50 MG/ML IJ SOLN
25.0000 mg | Freq: Once | INTRAMUSCULAR | Status: AC
  Administered 2021-07-01: 25 mg via INTRAVENOUS
  Filled 2021-07-01: qty 1

## 2021-07-01 MED ORDER — SODIUM CHLORIDE 0.9 % IV SOLN: Freq: Once | INTRAVENOUS | Status: AC

## 2021-07-01 NOTE — Patient Instructions (Signed)
[facility]   Discharge Instructions: Thank you for choosing [facility] to provide your oncology and hematology care.   If you have a lab appointment with the [facility], please go directly to the [facility] and check in at the registration area.   Wear comfortable clothing and clothing appropriate for easy access to any Portacath or PICC line.   We strive to give you quality time with your provider. You may need to reschedule your appointment if you arrive late (15 or more minutes).  Arriving late affects you and other patients whose appointments are after yours.  Also, if you miss three or more appointments without notifying the office, you may be dismissed from the clinic at the provider's discretion.      For prescription refill requests, have your pharmacy contact our office and allow 72 hours for refills to be completed.    Today you received the following chemotherapy and/or immunotherapy agents: Paclitaxel (Taxol)      To help prevent nausea and vomiting after your treatment, we encourage you to take your nausea medication as directed.  BELOW ARE SYMPTOMS THAT SHOULD BE REPORTED IMMEDIATELY: *FEVER GREATER THAN 100.4 F (38 C) OR HIGHER *CHILLS OR SWEATING *NAUSEA AND VOMITING THAT IS NOT CONTROLLED WITH YOUR NAUSEA MEDICATION *UNUSUAL SHORTNESS OF BREATH *UNUSUAL BRUISING OR BLEEDING *URINARY PROBLEMS (pain or burning when urinating, or frequent urination) *BOWEL PROBLEMS (unusual diarrhea, constipation, pain near the anus) TENDERNESS IN MOUTH AND THROAT WITH OR WITHOUT PRESENCE OF ULCERS (sore throat, sores in mouth, or a toothache) UNUSUAL RASH, SWELLING OR PAIN  UNUSUAL VAGINAL DISCHARGE OR ITCHING   Items with * indicate a potential emergency and should be followed up as soon as possible or go to the Emergency Department if any problems should occur.  Please show the CHEMOTHERAPY ALERT CARD or IMMUNOTHERAPY ALERT CARD at  check-in to the Emergency Department and triage nurse.  Should you have questions after your visit or need to cancel or reschedule your appointment, please contact [facility]  Dept: [phone]  and follow the prompts.  Office hours are 8:00 a.m. to 4:30 p.m. Monday - Friday. Please note that voicemails left after 4:00 p.m. may not be returned until the following business day.  We are closed weekends and major holidays. You have access to a nurse at all times for urgent questions. Please call the main number to the clinic Dept: [phone] and follow the prompts.   For any non-urgent questions, you may also contact your provider using MyChart. We now offer e-Visits for anyone 27 and older to request care online for non-urgent symptoms. For details visit [email or web address].   Also download the MyChart app! Go to the app store, search "MyChart", open the app, select [facility], and log in with your MyChart username and password.  Due to Covid, a mask is required upon entering the hospital/clinic. If you do not have a mask, one will be given to you upon arrival. For doctor visits, patients may have 1 support person aged 46 or older with them. For treatment visits, patients cannot have anyone with them due to current Covid guidelines and our immunocompromised population.

## 2021-07-02 ENCOUNTER — Encounter (HOSPITAL_COMMUNITY): Payer: Self-pay | Admitting: General Surgery

## 2021-07-02 LAB — T4: T4, Total: 6.5 ug/dL (ref 4.5–12.0)

## 2021-07-07 ENCOUNTER — Telehealth: Payer: Self-pay | Admitting: Genetic Counselor

## 2021-07-07 ENCOUNTER — Encounter: Payer: Self-pay | Admitting: Oncology

## 2021-07-07 MED FILL — Dexamethasone Sodium Phosphate Inj 100 MG/10ML: INTRAMUSCULAR | Qty: 1 | Status: AC

## 2021-07-07 NOTE — Telephone Encounter (Signed)
I contacted Rita Davis to discuss her genetic testing results. No pathogenic variants were identified in the 47 genes analyzed. Of note, a variant of uncertain significance was detected in the BRIP1 and TSC2 genes. We discussed that we do not know at this time the cause of her breast cancer. It could be sporadic/familial, due to a different gene that we are not testing, or maybe she has a pathogenic variant that is unable to detected by our current technology.  It will be important for her to keep in contact with genetics to see whether additional testing may be recommended.

## 2021-07-07 NOTE — Progress Notes (Signed)
Called pt to introduce myself as her Arboriculturist and to discuss copay assistance.  Pt would like to apply and gave me consent to apply in her behalf so I completed the application w/ [employer] for [employer].  I also completed the [employer] for [place], will get hers and Dr. Virgie Dad signature for both applications and fax to [employer] and [employer] for processing.  I will notify the pt of the outcome once I receive it.  I informed her of the [employer], went over what it covers and gave her the income requirement.  She stated she is over the requirement so she doesn't qualify for the grant at this time.  I will give her my card for any questions or concerns she may have in the future.

## 2021-07-08 ENCOUNTER — Inpatient Hospital Stay: Payer: BC Managed Care – PPO | Attending: Genetic Counselor

## 2021-07-08 ENCOUNTER — Other Ambulatory Visit: Payer: Self-pay

## 2021-07-08 ENCOUNTER — Inpatient Hospital Stay: Payer: BC Managed Care – PPO | Admitting: Oncology

## 2021-07-08 ENCOUNTER — Inpatient Hospital Stay: Payer: BC Managed Care – PPO

## 2021-07-08 VITALS — BP 151/83 | HR 84 | Temp 97.8°F | Resp 18 | Ht 68.0 in | Wt 205.4 lb

## 2021-07-08 DIAGNOSIS — Z8041 Family history of malignant neoplasm of ovary: Secondary | ICD-10-CM | POA: Diagnosis not present

## 2021-07-08 DIAGNOSIS — Z800 Family history of malignant neoplasm of digestive organs: Secondary | ICD-10-CM | POA: Insufficient documentation

## 2021-07-08 DIAGNOSIS — Z171 Estrogen receptor negative status [ER-]: Secondary | ICD-10-CM | POA: Diagnosis not present

## 2021-07-08 DIAGNOSIS — Z95828 Presence of other vascular implants and grafts: Secondary | ICD-10-CM

## 2021-07-08 DIAGNOSIS — Z8042 Family history of malignant neoplasm of prostate: Secondary | ICD-10-CM | POA: Diagnosis not present

## 2021-07-08 DIAGNOSIS — Z803 Family history of malignant neoplasm of breast: Secondary | ICD-10-CM | POA: Diagnosis not present

## 2021-07-08 DIAGNOSIS — Z5112 Encounter for antineoplastic immunotherapy: Secondary | ICD-10-CM | POA: Insufficient documentation

## 2021-07-08 DIAGNOSIS — Z8249 Family history of ischemic heart disease and other diseases of the circulatory system: Secondary | ICD-10-CM | POA: Diagnosis not present

## 2021-07-08 DIAGNOSIS — Z5111 Encounter for antineoplastic chemotherapy: Secondary | ICD-10-CM | POA: Insufficient documentation

## 2021-07-08 DIAGNOSIS — C50211 Malignant neoplasm of upper-inner quadrant of right female breast: Secondary | ICD-10-CM

## 2021-07-08 DIAGNOSIS — Z79899 Other long term (current) drug therapy: Secondary | ICD-10-CM | POA: Diagnosis not present

## 2021-07-08 DIAGNOSIS — Z809 Family history of malignant neoplasm, unspecified: Secondary | ICD-10-CM | POA: Insufficient documentation

## 2021-07-08 LAB — CBC WITH DIFFERENTIAL (CANCER CENTER ONLY)
Abs Immature Granulocytes: 0.17 10*3/uL — ABNORMAL HIGH (ref 0.00–0.07)
Basophils Absolute: 0 10*3/uL (ref 0.0–0.1)
Basophils Relative: 1 %
Eosinophils Absolute: 0.1 10*3/uL (ref 0.0–0.5)
Eosinophils Relative: 2 %
HCT: 34.5 % — ABNORMAL LOW (ref 36.0–46.0)
Hemoglobin: 11.4 g/dL — ABNORMAL LOW (ref 12.0–15.0)
Immature Granulocytes: 4 %
Lymphocytes Relative: 33 %
Lymphs Abs: 1.4 10*3/uL (ref 0.7–4.0)
MCH: 29.5 pg (ref 26.0–34.0)
MCHC: 33 g/dL (ref 30.0–36.0)
MCV: 89.4 fL (ref 80.0–100.0)
Monocytes Absolute: 0.4 10*3/uL (ref 0.1–1.0)
Monocytes Relative: 9 %
Neutro Abs: 2.2 10*3/uL (ref 1.7–7.7)
Neutrophils Relative %: 51 %
Platelet Count: 286 10*3/uL (ref 150–400)
RBC: 3.86 MIL/uL — ABNORMAL LOW (ref 3.87–5.11)
RDW: 14.8 % (ref 11.5–15.5)
WBC Count: 4.3 10*3/uL (ref 4.0–10.5)
nRBC: 0 % (ref 0.0–0.2)

## 2021-07-08 LAB — CMP (CANCER CENTER ONLY)
ALT: 34 U/L (ref 0–44)
AST: 19 U/L (ref 15–41)
Albumin: 3.7 g/dL (ref 3.5–5.0)
Alkaline Phosphatase: 82 U/L (ref 38–126)
Anion gap: 10 (ref 5–15)
BUN: 14 mg/dL (ref 6–20)
CO2: 24 mmol/L (ref 22–32)
Calcium: 9.1 mg/dL (ref 8.9–10.3)
Chloride: 105 mmol/L (ref 98–111)
Creatinine: 1.13 mg/dL — ABNORMAL HIGH (ref 0.44–1.00)
GFR, Estimated: 56 mL/min — ABNORMAL LOW (ref >60)
Glucose, Bld: 126 mg/dL — ABNORMAL HIGH (ref 70–99)
Potassium: 4 mmol/L (ref 3.5–5.1)
Sodium: 139 mmol/L (ref 135–145)
Total Bilirubin: 0.2 mg/dL — ABNORMAL LOW (ref 0.3–1.2)
Total Protein: 7 g/dL (ref 6.5–8.1)

## 2021-07-08 LAB — TSH: TSH: 1.197 u[IU]/mL (ref 0.308–3.960)

## 2021-07-08 MED ORDER — SODIUM CHLORIDE 0.9% FLUSH
10.0000 mL | INTRAVENOUS | Status: DC | PRN
  Administered 2021-07-08: 10 mL

## 2021-07-08 MED ORDER — DIPHENHYDRAMINE HCL 50 MG/ML IJ SOLN
25.0000 mg | Freq: Once | INTRAMUSCULAR | Status: AC
  Administered 2021-07-08: 25 mg via INTRAVENOUS
  Filled 2021-07-08: qty 1

## 2021-07-08 MED ORDER — SODIUM CHLORIDE 0.9 % IV SOLN: Freq: Once | INTRAVENOUS | Status: AC

## 2021-07-08 MED ORDER — SODIUM CHLORIDE 0.9 % IV SOLN
80.0000 mg/m2 | Freq: Once | INTRAVENOUS | Status: AC
  Administered 2021-07-08: 168 mg via INTRAVENOUS
  Filled 2021-07-08: qty 28

## 2021-07-08 MED ORDER — SODIUM CHLORIDE 0.9 % IV SOLN
10.0000 mg | Freq: Once | INTRAVENOUS | Status: AC
  Administered 2021-07-08: 10 mg via INTRAVENOUS
  Filled 2021-07-08: qty 10

## 2021-07-08 MED ORDER — HEPARIN SOD (PORK) LOCK FLUSH 100 UNIT/ML IV SOLN
500.0000 [IU] | Freq: Once | INTRAVENOUS | Status: AC | PRN
  Administered 2021-07-08: 500 [IU]

## 2021-07-08 MED ORDER — FAMOTIDINE 20 MG IN NS 100 ML IVPB
20.0000 mg | Freq: Once | INTRAVENOUS | Status: AC
  Administered 2021-07-08: 20 mg via INTRAVENOUS
  Filled 2021-07-08: qty 100

## 2021-07-08 MED ORDER — SODIUM CHLORIDE 0.9% FLUSH
10.0000 mL | Freq: Once | INTRAVENOUS | Status: AC
  Administered 2021-07-08: 10 mL

## 2021-07-08 NOTE — Patient Instructions (Signed)
[facility]   Discharge Instructions: Thank you for choosing [facility] to provide your oncology and hematology care.   If you have a lab appointment with the [facility], please go directly to the [facility] and check in at the registration area.   Wear comfortable clothing and clothing appropriate for easy access to any Portacath or PICC line.   We strive to give you quality time with your provider. You may need to reschedule your appointment if you arrive late (15 or more minutes).  Arriving late affects you and other patients whose appointments are after yours.  Also, if you miss three or more appointments without notifying the office, you may be dismissed from the clinic at the provider's discretion.      For prescription refill requests, have your pharmacy contact our office and allow 72 hours for refills to be completed.    Today you received the following chemotherapy and/or immunotherapy agents: Paclitaxel (Taxol)      To help prevent nausea and vomiting after your treatment, we encourage you to take your nausea medication as directed.  BELOW ARE SYMPTOMS THAT SHOULD BE REPORTED IMMEDIATELY: *FEVER GREATER THAN 100.4 F (38 C) OR HIGHER *CHILLS OR SWEATING *NAUSEA AND VOMITING THAT IS NOT CONTROLLED WITH YOUR NAUSEA MEDICATION *UNUSUAL SHORTNESS OF BREATH *UNUSUAL BRUISING OR BLEEDING *URINARY PROBLEMS (pain or burning when urinating, or frequent urination) *BOWEL PROBLEMS (unusual diarrhea, constipation, pain near the anus) TENDERNESS IN MOUTH AND THROAT WITH OR WITHOUT PRESENCE OF ULCERS (sore throat, sores in mouth, or a toothache) UNUSUAL RASH, SWELLING OR PAIN  UNUSUAL VAGINAL DISCHARGE OR ITCHING   Items with * indicate a potential emergency and should be followed up as soon as possible or go to the Emergency Department if any problems should occur.  Please show the CHEMOTHERAPY ALERT CARD or IMMUNOTHERAPY ALERT CARD at  check-in to the Emergency Department and triage nurse.  Should you have questions after your visit or need to cancel or reschedule your appointment, please contact [facility]  Dept: [phone]  and follow the prompts.  Office hours are 8:00 a.m. to 4:30 p.m. Monday - Friday. Please note that voicemails left after 4:00 p.m. may not be returned until the following business day.  We are closed weekends and major holidays. You have access to a nurse at all times for urgent questions. Please call the main number to the clinic Dept: [phone] and follow the prompts.   For any non-urgent questions, you may also contact your provider using MyChart. We now offer e-Visits for anyone 27 and older to request care online for non-urgent symptoms. For details visit [email or web address].   Also download the MyChart app! Go to the app store, search "MyChart", open the app, select [facility], and log in with your MyChart username and password.  Due to Covid, a mask is required upon entering the hospital/clinic. If you do not have a mask, one will be given to you upon arrival. For doctor visits, patients may have 1 support person aged 46 or older with them. For treatment visits, patients cannot have anyone with them due to current Covid guidelines and our immunocompromised population.

## 2021-07-08 NOTE — Progress Notes (Signed)
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   She began neoadjuvant chemotherapy, consisting of carboplatin paclitaxel pembrolizumab for 4 cycles, on 06/24/2021.  This will be followed by 4 cycles of doxorubicin and cyclophosphamide again with pembrolizumab.  Today is day 15 cycle 1, she receives paclitaxel alone.  Since her last visit, she presented for MRI-guided right breast biopsy on 06/29/2021, but on re-evaluation, the non-mass enhancement had resolved.  Her genetic testing results returned negative, with the exception of two variants of uncertain significance-- one in BRIP1 and the other in TSC2   REVIEW OF SYSTEMS: Rita Davis has had some bony aches from her treatment but she says these were minor and she can tolerate them.  Possibly this were due to to the first Taxol dose.  This should not occur with subsequent doses.  She is walking up to 2 miles at a time.  She is doing Pilates.  She is very excited because she no longer can feel the mass in her breast.  She has lost her hair but has a very becoming wig.  A detailed review of systems today was otherwise stable.  COVID 19 VACCINATION STATUS:  Status post Pfizer x3 as of August 2022   HISTORY OF CURRENT ILLNESS: From the original intake  note:  Rita Davis herself noted a mass in her right breast.  She brought it to medical attention and her mammogram was moved up to 06/03/2021 at the breast center.  This found the breast to be density category C.  On physical exam there was a palpable firm fixed mass in the upper inner quadrant of the right breast.  On the mammogram there was an irregular mass with spiculated borders which on ultrasound measured 2.9 cm at the 1 o'clock position 10 cm from the nipple.  The right axilla was sonographically benign.  Biopsy of the right breast mass in question 06/03/2021 showed an invasive ductal carcinoma, grade 3, estrogen receptor "positive" at 5% with weak staining intensity (functionally estrogen receptor negative), progesterone receptor negative, HER2 equivocal by immunotherapy but negative by FISH, with a proliferation marker of 95%.  Cancer Staging Malignant neoplasm of upper-inner quadrant of right breast in female, estrogen receptor negative (Rita Davis) Staging form: Breast, AJCC 8th Edition - Clinical: Stage IIB (cT2, cN0, cM0, G3, ER-, PR-, HER2-) - Signed by Chauncey Cruel, MD on 06/09/2021  The patient's subsequent history is as detailed below   PAST MEDICAL HISTORY: Past Medical History:  Diagnosis Date   Anemia    had a fibroid tumor, was anemic at that time   Anxiety    Cancer [facility])    breast cancer   Family history of breast cancer    Family history of colon cancer    Family history of prostate cancer    Hyperlipidemia    Hypertension    Pneumonia    as a child   Pre-diabetes  PAST SURGICAL HISTORY: Past Surgical History:  Procedure Laterality Date   ABDOMINAL HYSTERECTOMY     still has ovaries   BREAST BIOPSY Right 05/23/2012   BREAST CYST EXCISION Right    cyst removed     from lower back   DILATION AND CURETTAGE OF UTERUS     ORIF ANKLE FRACTURE Left 11/13/2020   Procedure: OPEN TREATMENT OF LEFT TRIMALLEOLAR ANKLE FRACTURE WITH POSTERIOR FIXATION, SYNDESMOSIS;   Surgeon: Erle Crocker, MD;  Location: [place];  Service: Orthopedics;  Laterality: Left;  LENGTH OF SURGERY: 1.5 HOURS   PORTACATH PLACEMENT Left 06/18/2021   Procedure: PORT PLACEMENT;  Surgeon: Stark Klein, MD;  Location: [place];  Service: General;  Laterality: Left;    FAMILY HISTORY Family History  Problem Relation Age of Onset   Hypertension Mother    Breast cancer Mother 64       declined treatment   Prostate cancer Father        metastatic, dx 50s   Colon cancer Brother 20   Cancer Maternal Great-grandmother 60       gynecologic cancer (MGM's mother)  The patient's father died at the age of 76, having had breast cancer twice.  The patient has little information on his side of the family.  The patient's mother is 28 years old as of August 2022.  She is currently under hospice care for metastatic breast cancer having refused active treatment (she was evaluated by Dr. Lindi Adie).  Also on the maternal side there is a great grandmother with ovarian cancer.  The patient had one half brother who died from colon cancer at the age of 72.  Another half brother survives.   GYNECOLOGIC HISTORY:  No LMP recorded. Patient has had a hysterectomy. Menarche: 60 years old Age at first live birth: 60 years old [facility] Home P 2 LMP 12/28/14 Contraceptive several years with no complications HRT no Hysterectomy?  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  Her husband died 2020-03-30 from a cardiac arrest in the setting of renal failure.  The patient lives by herself with no pets.  Son Rita Davis graduated from [employer] and works for [employer] in [place].  Son Rita Davis graduated from [employer] with a business administration degree and placed in a band    ADVANCED DIRECTIVES: Not in place.  As of 06/09/2021 the patient has not discussed her diagnosis of breast cancer with her children.   HEALTH MAINTENANCE: Social History    Tobacco Use   Smoking status: Never   Smokeless tobacco: Never  Substance Use Topics   Alcohol use: No   Drug use: No     Colonoscopy:  PAP:  Bone density:   No Known Allergies  Current Outpatient Medications  Medication Sig Dispense Refill   amLODipine-olmesartan (AZOR) 5-40 MG tablet Take 1 tablet by mouth in the morning.     betamethasone valerate (VALISONE) 0.1 % cream Apply 1 application topically daily as needed (skin irritation.).     bimatoprost (LATISSE) 0.03 % ophthalmic solution Place one drop on applicator and apply evenly along the skin of the upper eyelid at base of eyelashes once daily at bedtime; repeat procedure for second eye (use a clean applicator).     Calcium-Magnesium-Zinc (CAL-MAG-ZINC PO) Take 1 tablet by mouth every other day. In the morning     Cholecalciferol (VITAMIN D3) 250 MCG (10000 UT) TABS Take 10,000 Units by mouth daily.  dexamethasone (DECADRON) 4 MG tablet Take 2 tablets once a day for 3 days after carboplatin and AC chemotherapy. Take with food. 30 tablet 1   escitalopram (LEXAPRO) 10 MG tablet Take 10 mg by mouth at bedtime.     lidocaine-prilocaine (EMLA) cream Apply to affected area once 30 g 3   Misc Natural Products (SAMBUCUS ELDERBERRY IMMUNE PO) Take 5 mLs by mouth daily.     Misc Natural Products (SUPER GREENS) POWD Take 1 Scoop by mouth daily.     oxyCODONE (OXY IR/ROXICODONE) 5 MG immediate release tablet Take 1 tablet (5 mg total) by mouth every 6 (six) hours as needed for severe pain. 5 tablet 0   pravastatin (PRAVACHOL) 20 MG tablet Take 20 mg by mouth once a week.     prochlorperazine (COMPAZINE) 10 MG tablet Take 1 tablet (10 mg total) by mouth every 6 (six) hours as needed (Nausea or vomiting). 30 tablet 1   No current facility-administered medications for this visit.   Facility-Administered Medications Ordered in Other Visits  Medication Dose Route Frequency Provider Last Rate Last Admin   heparin lock flush 100 unit/mL   500 Units Intracatheter Once PRN Magrinat, Virgie Dad, MD       PACLitaxel (TAXOL) 168 mg in sodium chloride 0.9 % 250 mL chemo infusion (</= 83m/m2)  80 mg/m2 (Treatment Plan Recorded) Intravenous Once Magrinat, GVirgie Dad MD       sodium chloride flush (NS) 0.9 % injection 10 mL  10 mL Intracatheter PRN Magrinat, GVirgie Dad MD        OBJECTIVE: African-American woman who appears younger than stated age  Vitals:   07/08/21 0854  BP: (!) 151/83  Pulse: 84  Resp: 18  Temp: 97.8 F (36.6 C)  SpO2: 97%      Body mass index is 31.23 kg/m.   Wt Readings from Last 3 Encounters:  07/08/21 205 lb 6.4 oz (93.2 kg)  07/01/21 206 lb 6.4 oz (93.6 kg)  06/22/21 204 lb 1.6 oz (92.6 kg)      ECOG FS:1 - Symptomatic but completely ambulatory  Sclerae unicteric, EOMs intact Wearing a mask No cervical or supraclavicular adenopathy Lungs no rales or rhonchi Heart regular rate and rhythm Abd soft, nontender, positive bowel sounds MSK no focal spinal tenderness, no upper extremity lymphedema Neuro: nonfocal, well oriented, appropriate affect Breasts: I do not feel a well-defined mass in the right breast.  Both the patient's breasts are relatively dense and not easy to exam.  There is no skin or nipple change of concern.  Left breast and both axilla are benign.   LAB RESULTS:  CMP     Component Value Date/Time   NA 139 07/08/2021 0825   K 4.0 07/08/2021 0825   CL 105 07/08/2021 0825   CO2 24 07/08/2021 0825   GLUCOSE 126 (H) 07/08/2021 0825   BUN 14 07/08/2021 0825   CREATININE 1.13 (H) 07/08/2021 0825   CALCIUM 9.1 07/08/2021 0825   PROT 7.0 07/08/2021 0825   ALBUMIN 3.7 07/08/2021 0825   AST 19 07/08/2021 0825   ALT 34 07/08/2021 0825   ALKPHOS 82 07/08/2021 0825   BILITOT 0.2 (L) 07/08/2021 0825   GFRNONAA 56 (L) 07/08/2021 0825   GFRAA 86 04/18/2008 1652    No results found for: TOTALPROTELP, ALBUMINELP, A1GS, A2GS, BETS, BETA2SER, GAMS, MSPIKE, SPEI  No results found for:  KPAFRELGTCHN, LAMBDASER, KAPLAMBRATIO  Lab Results  Component Value Date   WBC 4.3 07/08/2021   NEUTROABS 2.2  07/08/2021   HGB 11.4 (L) 07/08/2021   HCT 34.5 (L) 07/08/2021   MCV 89.4 07/08/2021   PLT 286 07/08/2021      Chemistry      Component Value Date/Time   NA 139 07/08/2021 0825   K 4.0 07/08/2021 0825   CL 105 07/08/2021 0825   CO2 24 07/08/2021 0825   BUN 14 07/08/2021 0825   CREATININE 1.13 (H) 07/08/2021 0825      Component Value Date/Time   CALCIUM 9.1 07/08/2021 0825   ALKPHOS 82 07/08/2021 0825   AST 19 07/08/2021 0825   ALT 34 07/08/2021 0825   BILITOT 0.2 (L) 07/08/2021 0825       No results found for: LABCA2  No components found for: [id number]  No results for input(s): INR in the last 168 hours.  No results found for: LABCA2  No results found for: NTI144  No results found for: RXV400  No results found for: QQP619  No results found for: CA2729  No components found for: HGQUANT  No results found for: CEA1 / No results found for: CEA1   No results found for: AFPTUMOR  No results found for: CHROMOGRNA  No results found for: TOTALPROTELP, ALBUMINELP, A1GS, A2GS, BETS, BETA2SER, GAMS, MSPIKE, SPEI (this displays SPEP labs)  No results found for: KPAFRELGTCHN, LAMBDASER, KAPLAMBRATIO (kappa/lambda light chains)  No results found for: HGBA, HGBA2QUANT, HGBFQUANT, HGBSQUAN (Hemoglobinopathy evaluation)   No results found for: LDH  No results found for: IRON, TIBC, IRONPCTSAT (Iron and TIBC)  No results found for: FERRITIN  Urinalysis    Component Value Date/Time   COLORURINE YELLOW 11/20/2007 0951   APPEARANCEUR Sl Cloudy 11/20/2007 0951   LABSPEC > OR = 1.030 11/20/2007 0951   PHURINE 5.5 11/20/2007 0951   GLUCOSEU NEGATIVE 11/20/2007 0951   BILIRUBINUR NEGATIVE 11/20/2007 0951   KETONESUR NEGATIVE 11/20/2007 0951   UROBILINOGEN 0.2 mg/dL 11/20/2007 0951   NITRITE Negative 11/20/2007 0951   LEUKOCYTESUR Negative 11/20/2007  0951    STUDIES: MR BREAST RIGHT W WO CONTRAST INC CAD  Result Date: 06/29/2021 CLINICAL DATA:  60 year old female with recently diagnosed right breast cancer presents for MRI guided biopsy of additional non-mass enhancement seen anterior to the primary malignancy on recent MRI. LABS:  None performed on site. EXAM: MR OF THE RIGHT BREAST WITH AND WITHOUT CONTRAST TECHNIQUE: Multiplanar, multisequence MR images of the right breast were obtained prior to and following the intravenous administration of 9 ml of Gadavist. Three-dimensional MR images were rendered by post-processing of the original MR data on an independent workstation. The three-dimensional MR images were interpreted, and findings are reported in the following complete MRI report for this study. Three dimensional images were evaluated at the independent DynaCad workstation. COMPARISON:  Previous exam(s). FINDINGS: Breast composition: c. Heterogeneous fibroglandular tissue. Background parenchymal enhancement: Minimal. Right breast: Susceptibility artifact from post biopsy marking clip is seen in association with an enhancing mass measuring up to 3.3 cm in the upper outer quadrant posteriorly. No associated non mass enhancement is seen on today's study surrounding the mass or extending from it. Previously demonstrated non-mass enhancement they have been related to post biopsy changes. Ancillary findings:  None. IMPRESSION: Interval resolution of non-mass enhancement extending from the patient's biopsy proven right breast malignancy. The findings may have been related to post biopsy changes. RECOMMENDATION: Per clinical treatment plan. BI-RADS CATEGORY  6: Known biopsy-proven malignancy. Electronically Signed   By: Kristopher Oppenheim M.D.   On: 06/29/2021 10:33  MR BREAST BILATERAL  W WO CONTRAST INC CAD  Result Date: 06/15/2021 CLINICAL DATA:  60 year old female with biopsy proven grade 3 invasive ductal carcinoma of the right breast at 1 o'clock  (coil clip). She has a ribbon clip in the lower-inner quadrant of the right breast from a biopsy of a benign fibroadenoma from 2013. She presents for MRI for initial staging. She has family history of breast cancer in her mother at age 61. LABS:  Creatinine of 1.24 mg per dL and GFR of 50 on 06/09/2021. EXAM: BILATERAL BREAST MRI WITH AND WITHOUT CONTRAST TECHNIQUE: Multiplanar, multisequence MR images of both breasts were obtained prior to and following the intravenous administration of 9 ml of Gadavist Three-dimensional MR images were rendered by post-processing of the original MR data on an independent workstation. The three-dimensional MR images were interpreted, and findings are reported in the following complete MRI report for this study. Three dimensional images were evaluated at the independent interpreting workstation using the DynaCAD thin client. COMPARISON:  No prior MRI available for comparison. Correlation made with prior mammograms and ultrasound images. FINDINGS: Breast composition: c. Heterogeneous fibroglandular tissue. Background parenchymal enhancement: Minimal Right breast: The biopsy-proven cancer in the upper inner posterior right breast is identified, with susceptibility artifact centrally located corresponding with the biopsy marking clip. The mass itself measures approximately 3.3 cm by MRI (2.9 cm with ultrasound). There is some additional non mass enhancement extending anteriorly from the mass, all together measuring 5.0 cm (series 12, image 37). No additional suspicious areas of enhancement are found in the right breast. Left breast: No mass or abnormal enhancement. Lymph nodes: No abnormal appearing lymph nodes. Ancillary findings:  None. IMPRESSION: 1. The biopsied proven malignancy in the upper inner right breast measures 3.3 cm. There is indeterminate non mass enhancement extending anteriorly from the mass, which all together spans approximately 5.0 cm. 2.  No MRI evidence of left  breast malignancy. RECOMMENDATION: 1. If it will change clinical management, MRI guided biopsy of the anterior margin of the non mass enhancement in the right breast could be performed. Otherwise, continue treatment plan. BI-RADS CATEGORY  6: Known biopsy-proven malignancy. Electronically Signed   By: Ammie Ferrier M.D.   On: 06/15/2021 10:42  DG CHEST PORT 1 VIEW  Result Date: 06/18/2021 CLINICAL DATA:  Port-A-Cath placement EXAM: PORTABLE CHEST 1 VIEW COMPARISON:  None. FINDINGS: Interval placement of a left chest port with catheter tip overlying the superior cavoatrial junction. The heart size and mediastinal contours are within normal limits. No focal pulmonary opacity. No pleural effusion or pneumothorax. The visualized skeletal structures are unremarkable. IMPRESSION: Status post placement of a left chest port.  Otherwise normal chest. Electronically Signed   By: Merilyn Baba M.D.   On: 06/18/2021 17:38   DG Fluoro Guide CV Line-No Report  Result Date: 06/18/2021 Fluoroscopy was utilized by the requesting physician.  No radiographic interpretation.   ECHOCARDIOGRAM COMPLETE  Result Date: 06/18/2021    ECHOCARDIOGRAM REPORT   Patient Name:   Rita Davis Date of Exam: 06/18/2021 Medical Rec #:  [id number]       Height:       68.0 in Accession #:    [id number]      Weight:       201.6 lb Date of Birth:  1960/11/17       BSA:          2.051 m Patient Age:    20 years        BP:  149/77 mmHg Patient Gender: F               HR:           56 bpm. Exam Location:  Outpatient Procedure: 2D Echo, Color Doppler, Cardiac Doppler and Strain Analysis Indications:    Chemo Z09  History:        Patient has no prior history of Echocardiogram examinations.                 Risk Factors:Dyslipidemia and Hypertension.  Sonographer:    Bernadene Person RDCS Referring Phys: [place]  1. Left ventricular ejection fraction, by estimation, is 55 to 60%. The left ventricle has normal  function. The left ventricle has no regional wall motion abnormalities. Left ventricular diastolic parameters were normal. The average left ventricular global longitudinal strain is -21.1 %. The global longitudinal strain is normal.  2. Right ventricular systolic function is normal. The right ventricular size is normal.  3. The mitral valve is normal in structure. Trivial mitral valve regurgitation. No evidence of mitral stenosis.  4. The aortic valve is normal in structure. There is mild calcification of the aortic valve. Aortic valve regurgitation is not visualized. Mild aortic valve sclerosis is present, with no evidence of aortic valve stenosis.  5. The inferior vena cava is normal in size with greater than 50% respiratory variability, suggesting right atrial pressure of 3 mmHg. FINDINGS  Left Ventricle: Left ventricular ejection fraction, by estimation, is 55 to 60%. The left ventricle has normal function. The left ventricle has no regional wall motion abnormalities. The average left ventricular global longitudinal strain is -21.1 %. The global longitudinal strain is normal. The left ventricular internal cavity size was normal in size. There is no left ventricular hypertrophy. Left ventricular diastolic parameters were normal. Right Ventricle: The right ventricular size is normal. No increase in right ventricular wall thickness. Right ventricular systolic function is normal. Left Atrium: Left atrial size was normal in size. Right Atrium: Right atrial size was normal in size. Pericardium: There is no evidence of pericardial effusion. Mitral Valve: The mitral valve is normal in structure. Trivial mitral valve regurgitation. No evidence of mitral valve stenosis. Tricuspid Valve: The tricuspid valve is normal in structure. Tricuspid valve regurgitation is not demonstrated. No evidence of tricuspid stenosis. Aortic Valve: The aortic valve is normal in structure. There is mild calcification of the aortic valve.  Aortic valve regurgitation is not visualized. Mild aortic valve sclerosis is present, with no evidence of aortic valve stenosis. Pulmonic Valve: The pulmonic valve was normal in structure. Pulmonic valve regurgitation is trivial. No evidence of pulmonic stenosis. Aorta: The aortic root is normal in size and structure. Venous: The inferior vena cava is normal in size with greater than 50% respiratory variability, suggesting right atrial pressure of 3 mmHg. IAS/Shunts: No atrial level shunt detected by color flow Doppler.  LEFT VENTRICLE PLAX 2D LVIDd:         4.60 cm  Diastology LVIDs:         2.70 cm  LV e' medial:    6.12 cm/s LV PW:         1.10 cm  LV E/e' medial:  11.5 LV IVS:        1.00 cm  LV e' lateral:   8.66 cm/s LVOT diam:     2.00 cm  LV E/e' lateral: 8.2 LV SV:         68 LV SV Index:  33       2D Longitudinal Strain LVOT Area:     3.14 cm 2D Strain GLS Avg:     -21.1 %  RIGHT VENTRICLE RV S prime:     9.00 cm/s TAPSE (M-mode): 1.8 cm LEFT ATRIUM             Index       RIGHT ATRIUM           Index LA diam:        3.00 cm 1.46 cm/m  RA Area:     11.60 cm LA Vol (A2C):   42.0 ml 20.48 ml/m RA Volume:   19.70 ml  9.61 ml/m LA Vol (A4C):   35.9 ml 17.51 ml/m LA Biplane Vol: 39.5 ml 19.26 ml/m  AORTIC VALVE LVOT Vmax:   98.60 cm/s LVOT Vmean:  62.900 cm/s LVOT VTI:    0.218 m  AORTA Ao Root diam: 3.40 cm Ao Asc diam:  3.20 cm MITRAL VALVE MV Area (PHT): 2.91 cm    SHUNTS MV Decel Time: 261 msec    Systemic VTI:  0.22 m MV E velocity: 70.60 cm/s  Systemic Diam: 2.00 cm MV A velocity: 77.50 cm/s MV E/A ratio:  0.91 Jenkins Rouge MD Electronically signed by Jenkins Rouge MD Signature Date/Time: 06/18/2021/12:34:34 PM    Final      ELIGIBLE FOR AVAILABLE RESEARCH PROTOCOL: no  ASSESSMENT: 60 y.o. [place] woman status post right breast upper inner quadrant biopsy 06/03/2021 for a clinical T2N0 invasive ductal carcinoma, grade 3, functionally triple negative, with an MIB-1 of 95%.  (1) genetics  testing  (2) neoadjuvant chemotherapy to consist of carboplatin paclitaxel pembrolizumab for 4 cycles starting 06/23/2021 followed by doxorubicin and cyclophosphamide pembrolizumab for 4 cycles  (3) definitive surgery to follow  (4) adjuvant radiation as appropriate   PLAN: Nolene is tolerating treatment remarkably well and she appears to be having an early response which is favorable.  She will proceed to her at Taxol today and she will receive Neupogen for the next 3days.  She will then return to see me on 07/15/2021 to start cycle #2.  I have commended her excellent exercise program.  She is also starting a very helpful diet.  She is very positive about her feelings and for example losing her hair which is so dramatic for so many patients really seems to have been a very minor bump on the road for her.    She knows to call for any other issue that may develop before the next visit.  Total encounter time 20 minutes.Chauncey Cruel, MD   07/08/2021 10:57 AM Medical Oncology and Hematology [facility] [place],  [id number] Tel. [phone]    Fax. [phone]    I, Wilburn Mylar, am acting as scribe for Dr. Virgie Dad. Rilley Stash.  I, Lurline Del MD, have reviewed the above documentation for accuracy and completeness, and I agree with the above.   *Total Encounter Time as defined by the Centers for Medicare and Medicaid Services includes, in addition to the face-to-face time of a patient visit (documented in the note above) non-face-to-face time: obtaining and reviewing outside history, ordering and reviewing medications, tests or procedures, care coordination (communications with other health care professionals or caregivers) and documentation in the medical record.

## 2021-07-09 ENCOUNTER — Inpatient Hospital Stay: Payer: BC Managed Care – PPO

## 2021-07-09 ENCOUNTER — Ambulatory Visit: Payer: Self-pay

## 2021-07-09 DIAGNOSIS — Z5112 Encounter for antineoplastic immunotherapy: Secondary | ICD-10-CM | POA: Diagnosis not present

## 2021-07-09 DIAGNOSIS — C50211 Malignant neoplasm of upper-inner quadrant of right female breast: Secondary | ICD-10-CM

## 2021-07-09 LAB — T4: T4, Total: 6.8 ug/dL (ref 4.5–12.0)

## 2021-07-09 MED ORDER — FILGRASTIM-AAFI 480 MCG/0.8ML IJ SOSY
480.0000 ug | PREFILLED_SYRINGE | Freq: Once | INTRAMUSCULAR | Status: AC
  Administered 2021-07-09: 480 ug via SUBCUTANEOUS
  Filled 2021-07-09: qty 0.8

## 2021-07-10 ENCOUNTER — Inpatient Hospital Stay: Payer: BC Managed Care – PPO

## 2021-07-10 ENCOUNTER — Encounter: Payer: Self-pay | Admitting: Genetic Counselor

## 2021-07-10 ENCOUNTER — Ambulatory Visit: Payer: Self-pay | Admitting: Genetic Counselor

## 2021-07-10 ENCOUNTER — Other Ambulatory Visit: Payer: Self-pay

## 2021-07-10 VITALS — BP 146/94 | HR 86 | Temp 98.5°F | Resp 18

## 2021-07-10 DIAGNOSIS — Z803 Family history of malignant neoplasm of breast: Secondary | ICD-10-CM

## 2021-07-10 DIAGNOSIS — C50211 Malignant neoplasm of upper-inner quadrant of right female breast: Secondary | ICD-10-CM

## 2021-07-10 DIAGNOSIS — Z8042 Family history of malignant neoplasm of prostate: Secondary | ICD-10-CM

## 2021-07-10 DIAGNOSIS — Z800 Family history of malignant neoplasm of digestive organs: Secondary | ICD-10-CM

## 2021-07-10 DIAGNOSIS — Z1379 Encounter for other screening for genetic and chromosomal anomalies: Secondary | ICD-10-CM | POA: Insufficient documentation

## 2021-07-10 DIAGNOSIS — Z5112 Encounter for antineoplastic immunotherapy: Secondary | ICD-10-CM | POA: Diagnosis not present

## 2021-07-10 MED ORDER — FILGRASTIM-AAFI 480 MCG/0.8ML IJ SOSY
480.0000 ug | PREFILLED_SYRINGE | Freq: Once | INTRAMUSCULAR | Status: AC
  Administered 2021-07-10: 480 ug via SUBCUTANEOUS
  Filled 2021-07-10: qty 0.8

## 2021-07-11 ENCOUNTER — Inpatient Hospital Stay: Payer: BC Managed Care – PPO

## 2021-07-11 ENCOUNTER — Other Ambulatory Visit: Payer: Self-pay

## 2021-07-11 VITALS — BP 158/82 | HR 98 | Temp 99.1°F | Resp 16

## 2021-07-11 DIAGNOSIS — Z5112 Encounter for antineoplastic immunotherapy: Secondary | ICD-10-CM | POA: Diagnosis not present

## 2021-07-11 DIAGNOSIS — C50211 Malignant neoplasm of upper-inner quadrant of right female breast: Secondary | ICD-10-CM

## 2021-07-11 MED ORDER — FILGRASTIM-AAFI 300 MCG/0.5ML IJ SOSY
480.0000 ug | PREFILLED_SYRINGE | Freq: Once | INTRAMUSCULAR | Status: AC
  Administered 2021-07-11: 480 ug via SUBCUTANEOUS
  Filled 2021-07-11: qty 0.8

## 2021-07-11 MED ORDER — FILGRASTIM-AAFI 300 MCG/0.5ML IJ SOSY
480.0000 ug | PREFILLED_SYRINGE | Freq: Once | INTRAMUSCULAR | Status: DC
  Filled 2021-07-11: qty 1

## 2021-07-11 MED ORDER — FILGRASTIM-AAFI 480 MCG/0.8ML IJ SOSY: 480.0000 ug | PREFILLED_SYRINGE | Freq: Once | INTRAMUSCULAR | Status: DC

## 2021-07-13 NOTE — Progress Notes (Signed)
HPI:  Rita Davis was previously seen in the [facility] due to a personal and family history of cancer and concerns regarding a hereditary predisposition to cancer. Please refer to our prior cancer genetics clinic note for more information regarding our discussion, assessment and recommendations, at the time. Rita Davis recent genetic test results were disclosed to her, as were recommendations warranted by these results. These results and recommendations are discussed in more detail below.  CANCER HISTORY:  Oncology History  Malignant neoplasm of upper-inner quadrant of right breast in female, estrogen receptor negative ([place])  06/09/2021 Initial Diagnosis   Malignant neoplasm of upper-inner quadrant of right breast in female, estrogen receptor negative ([place])   06/09/2021 Cancer Staging   Staging form: Breast, AJCC 8th Edition - Clinical: Stage IIB (cT2, cN0, cM0, G3, ER-, PR-, HER2-) - Signed by Chauncey Cruel, MD on 06/09/2021 Histologic grading system: 3 grade system   06/24/2021 -  Chemotherapy    Patient is on Treatment Plan: BREAST PEMBROLIZUMAB + CARBOPLATIN D1 + PACLITAXEL D1,8,15 Q21D X 4 CYCLES / PEMBROLIZUMAB + AC Q21D X 4 CYCLES       06/30/2021 Genetic Testing   Patient has genetic testing done for personal history of breast cancer. No pathogenic variants were detected. Of note, a variant of uncertain significance was detected in the BRIP1 and TSC2 genes.  The genes analyzed include: APC, ATM, AXIN2, BARD1, BMPR1A, BRCA1, BRCA2, BRIP1, CDH1, CDK4*, CDKN2A (p14ARF)*, CDKN2A (p16INK4a)*, CHEK2, CTNNA1, DICER1, EPCAM (Deletion/duplication testing only), GREM1 (promoter region deletion/duplication testing only), KIT, MEN1, MLH1, MSH2, MSH3, MSH6, MUTYH, NBN, NF1, NHTL1, PALB2, PDGFRA*, PMS2, POLD1, POLE, PTEN, RAD50, RAD51C, RAD51D, SDHB, SDHC, SDHD, SMAD4, SMARCA4. STK11, TP53, TSC1, TSC2, and VHL.  The following genes were evaluated for sequence changes only:  SDHA and HOXB13 c.251G>A variant only.      FAMILY HISTORY:  We obtained a detailed, 4-generation family history.  Significant diagnoses are listed below: Family History  Problem Relation Age of Onset   Hypertension Mother    Breast cancer Mother 79       declined treatment   Prostate cancer Father        metastatic, dx 63s   Colon cancer Brother 35   Cancer Maternal Great-grandmother 80       gynecologic cancer (MGM's mother)     Rita Davis has two sons (ages 81 and 36). She had one brother who died from colon cancer at age 55. She also has a paternal half-brother but does not have any information about him.   Rita Davis mother is alive at age 34 without cancer. There was one maternal aunt and one maternal uncle. There is no known cancer among maternal aunts/uncles or maternal cousins. Rita Davis maternal grandmother died at age 86 without cancer. Her maternal grandfather died at age 35 without cancer. Her maternal great-grandmother (MGM's mother) died at age 51 with an unknown gynecologic cancer (patient things cervical cancer).   Rita Davis father died at age 43 from metastatic prostate cancer (first diagnosed in his 21s). There were eight or nine paternal aunts/uncles, although Rita Davis does not have much information about these relatives. She does not have any information about her paternal grandparents.   Rita Davis is unaware of previous family history of genetic testing for hereditary cancer risks. Patient's maternal ancestors are of Black/African American, Cherokee Native American, and White/Caucasian descent, and paternal ancestors are of Black/African American descent. There is no reported Ashkenazi Jewish ancestry. There is  no known consanguinity.  GENETIC TEST RESULTS:  Genetic testing reported out on 06/30/2021. The [facility] CustomNext-Cancer Panel found no pathogenic mutations. The CustomNext-Cancer+RNAinsight panel offered by [facility] includes  sequencing and rearrangement analysis for the following 47 genes:  APC, ATM, AXIN2, BARD1, BMPR1A, BRCA1, BRCA2, BRIP1, CDH1, CDK4, CDKN2A, CHEK2, DICER1, EPCAM, GREM1, HOXB13, MEN1, MLH1, MSH2, MSH3, MSH6, MUTYH, NBN, NF1, NF2, NTHL1, PALB2, PMS2, POLD1, POLE, PTEN, RAD51C, RAD51D, RECQL, RET, SDHA, SDHAF2, SDHB, SDHC, SDHD, SMAD4, SMARCA4, STK11, TP53, TSC1, TSC2, and VHL.  RNA data is routinely analyzed for use in variant interpretation for all genes. The test report has been scanned into EPIC and is located under the Molecular Pathology section of the Results Review tab.  A portion of the result report is included below for reference.   recommended.             We discussed with Rita Davis that because current genetic testing is not perfect, it is possible there may be a gene mutation in one of these genes that current testing cannot detect, but that chance is small.  We also discussed, that there could be another gene that has not yet been discovered, or that we have not yet tested, that is responsible for the cancer diagnoses in the family. It is also possible there is a hereditary cause for the cancer in the family that Rita Davis did not inherit and therefore was not identified in her testing.  Therefore, it is important to remain in touch with cancer genetics in the future so that we can continue to offer Rita Davis the most up to date genetic testing.   Genetic testing did identify a variant of uncertain significance (VUS) in the BRIP1 and TSC2 genes.  At this time, it is unknown if the variants are associated with increased cancer risk or if this is a normal finding, but most variants such as this get reclassified to being inconsequential. It should not be used to make medical management decisions. With time, we suspect the lab will determine the significance of this variant, if any. If we do learn more about it, we will try to contact Rita Davis to discuss it further. However, it is  important to stay in touch with [place] periodically and keep the address and phone number up to date.  CANCER SCREENING RECOMMENDATIONS: Rita Davis test result is considered negative (normal).  This means that we have not identified a hereditary cause for her personal and family history of cancer at this time. Most cancers happen by chance and this negative test suggests that her cancer may fall into this category.    While reassuring, this does not definitively rule out a hereditary predisposition to cancer. It is still possible that there could be genetic mutations that are undetectable by current technology. There could be genetic mutations in genes that have not been tested or identified to increase cancer risk.  Therefore, it is recommended she continue to follow the cancer management and screening guidelines provided by her oncology and primary healthcare provider.   An individual's cancer risk and medical management are not determined by genetic test results alone. Overall cancer risk assessment incorporates additional factors, including personal medical history, family history, and any available genetic information that may result in a personalized plan for cancer prevention and surveillance  Due to Rita Davis's brother's history of colon cancer, we recommend Rita Davis have colonoscopies every 5 years. More frequent colonoscopies may be recommended if polyps are identified.  RECOMMENDATIONS FOR FAMILY MEMBERS:   Individuals in this family might be at some increased risk of developing cancer, over the general population risk, simply due to the family history of cancer.  We recommended women in this family have a yearly mammogram beginning at age 69, or 15 years younger than the earliest onset of cancer, an annual clinical breast exam, and perform monthly breast self-exams. Women in this family should also have a gynecological exam as recommended by their primary provider. Family members  should be referred for colonoscopy starting at age 37, or earlier, as recommended by their providers.  FOLLOW-UP:  Lastly, we discussed with Rita Davis that cancer genetics is a rapidly advancing field and it is possible that new genetic tests will be appropriate for her and/or her family members in the future. We encouraged her to remain in contact with cancer genetics on an annual basis so we can update her personal and family histories and let her know of advances in cancer genetics that may benefit this family.   Our contact number was provided. Rita Davis questions were answered to her satisfaction, and she knows she is welcome to call us at anytime with additional questions or concerns.   Lucille Passy, MS, [facility] Genetic Counselor [email or web address] (P) [phone]

## 2021-07-14 MED FILL — Fosaprepitant Dimeglumine For IV Infusion 150 MG (Base Eq): INTRAVENOUS | Qty: 5 | Status: AC

## 2021-07-14 MED FILL — Dexamethasone Sodium Phosphate Inj 100 MG/10ML: INTRAMUSCULAR | Qty: 1 | Status: AC

## 2021-07-14 NOTE — Progress Notes (Signed)
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   She began neoadjuvant chemotherapy, consisting of carboplatin paclitaxel pembrolizumab for 4 cycles, on 06/24/2021.  This will be followed by 4 cycles of doxorubicin and cyclophosphamide again with pembrolizumab.    Today is day 1 cycle 2.  And repeat breast MRI 06/29/2021 shows complete resolution of the previously noted non-mass-like enhancement which may have been really postbiopsy changes.   REVIEW OF SYSTEMS: Auria is tolerating treatment remarkably well.  She is going walking regularly.  She takes pictures of herself and faxes them to her children.  She is going to a concert with her son this Saturday.  She has had no peripheral neuropathy, no nausea or vomiting, no fatigue, no intercurrent infections and really a negative review of systems today.  COVID 19 VACCINATION STATUS:  Status post Pfizer x3 as of August 2022   HISTORY OF CURRENT ILLNESS: From the original intake note:  Juliett herself noted a mass in her right breast.  She brought it to medical attention and her mammogram was moved up to 06/03/2021 at the breast center.  This found the breast to be density category C.  On physical exam there was a palpable firm fixed mass in the upper  inner quadrant of the right breast.  On the mammogram there was an irregular mass with spiculated borders which on ultrasound measured 2.9 cm at the 1 o'clock position 10 cm from the nipple.  The right axilla was sonographically benign.  Biopsy of the right breast mass in question 06/03/2021 showed an invasive ductal carcinoma, grade 3, estrogen receptor "positive" at 5% with weak staining intensity (functionally estrogen receptor negative), progesterone receptor negative, HER2 equivocal by immunotherapy but negative by FISH, with a proliferation marker of 95%.  Cancer Staging Malignant neoplasm of upper-inner quadrant of right breast in female, estrogen receptor negative ([facility]) Staging form: Breast, AJCC 8th Edition - Clinical: Stage IIB (cT2, cN0, cM0, G3, ER-, PR-, HER2-) - Signed by Chauncey Cruel, MD on 06/09/2021  The patient's subsequent history is as detailed below   PAST MEDICAL HISTORY: Past Medical History:  Diagnosis Date   Anemia    had a fibroid tumor, was anemic at that time   Anxiety    Cancer [facility])    breast cancer   Family history of breast cancer    Family history of colon cancer    Family history of prostate cancer    Hyperlipidemia    Hypertension    Pneumonia    as a child   Pre-diabetes     PAST SURGICAL HISTORY: Past Surgical History:  Procedure Laterality Date   ABDOMINAL HYSTERECTOMY     still has ovaries   BREAST BIOPSY Right 05/23/2012   BREAST CYST EXCISION Right    cyst removed     from lower back   DILATION AND CURETTAGE OF UTERUS     ORIF ANKLE  FRACTURE Left 11/13/2020   Procedure: OPEN TREATMENT OF LEFT TRIMALLEOLAR ANKLE FRACTURE WITH POSTERIOR FIXATION, SYNDESMOSIS;  Surgeon: Erle Crocker, MD;  Location: [facility];  Service: Orthopedics;  Laterality: Left;  LENGTH OF SURGERY: 1.5 HOURS   PORTACATH PLACEMENT Left 06/18/2021   Procedure: PORT PLACEMENT;  Surgeon: Stark Klein, MD;  Location: [place];  Service: General;   Laterality: Left;    FAMILY HISTORY Family History  Problem Relation Age of Onset   Hypertension Mother    Breast cancer Mother 41       declined treatment   Prostate cancer Father        metastatic, dx 46s   Colon cancer Brother 69   Cancer Maternal Great-grandmother 26       gynecologic cancer (MGM's mother)  The patient's father died at the age of 60, having had breast cancer twice.  The patient has little information on his side of the family.  The patient's mother is 61 years old as of August 2022.  She is currently under hospice care for metastatic breast cancer having refused active treatment (she was evaluated by Dr. Lindi Adie).  Also on the maternal side there is a great grandmother with ovarian cancer.  The patient had one half brother who died from colon cancer at the age of 17.  Another half brother survives.   GYNECOLOGIC HISTORY:  No LMP recorded. Patient has had a hysterectomy. Menarche: 60 years old Age at first live birth: 60 years old [place] P 2 LMP 12/30/2014 Contraceptive several years with no complications HRT no Hysterectomy?  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  Her husband died 2020-03-19 from a cardiac arrest in the setting of renal failure.  The patient lives by herself with no pets.  Son Timmothy Sours graduated from [place] [employer] and works for [employer] in [place].  Son Lovena Le graduated from [employer] with a business administration degree and placed in a band    ADVANCED DIRECTIVES: Not in place.  As of 06/09/2021 the patient has not discussed her diagnosis of breast cancer with her children.   HEALTH MAINTENANCE: Social History   Tobacco Use   Smoking status: Never   Smokeless tobacco: Never  Substance Use Topics   Alcohol use: No   Drug use: No     Colonoscopy:  PAP:  Bone density:   No Known Allergies  Current Outpatient Medications  Medication Sig Dispense Refill   amLODipine-olmesartan  (AZOR) 5-40 MG tablet Take 1 tablet by mouth in the morning.     betamethasone valerate (VALISONE) 0.1 % cream Apply 1 application topically daily as needed (skin irritation.).     bimatoprost (LATISSE) 0.03 % ophthalmic solution Place one drop on applicator and apply evenly along the skin of the upper eyelid at base of eyelashes once daily at bedtime; repeat procedure for second eye (use a clean applicator).     Calcium-Magnesium-Zinc (CAL-MAG-ZINC PO) Take 1 tablet by mouth every other day. In the morning     Cholecalciferol (VITAMIN D3) 250 MCG (10000 UT) TABS Take 10,000 Units by mouth daily.     dexamethasone (DECADRON) 4 MG tablet Take 2 tablets once a day for 3 days after carboplatin and AC chemotherapy. Take with food. 30 tablet 1   escitalopram (LEXAPRO) 10 MG tablet Take 10 mg by mouth at bedtime.     lidocaine-prilocaine (EMLA) cream Apply to affected area once 30 g 3   Misc Natural  Products (SAMBUCUS ELDERBERRY IMMUNE PO) Take 5 mLs by mouth daily.     Misc Natural Products (SUPER GREENS) POWD Take 1 Scoop by mouth daily.     oxyCODONE (OXY IR/ROXICODONE) 5 MG immediate release tablet Take 1 tablet (5 mg total) by mouth every 6 (six) hours as needed for severe pain. 5 tablet 0   pravastatin (PRAVACHOL) 20 MG tablet Take 20 mg by mouth once a week.     prochlorperazine (COMPAZINE) 10 MG tablet Take 1 tablet (10 mg total) by mouth every 6 (six) hours as needed (Nausea or vomiting). 30 tablet 1   No current facility-administered medications for this visit.    OBJECTIVE: African-American woman who appears younger than stated age  37:   07/15/21 0836 07/15/21 0851  BP: 127/63 135/77  Pulse:  83  Resp:  18  Temp:  98.2 F (36.8 C)  SpO2:  99%      Body mass index is 31.23 kg/m.   Wt Readings from Last 3 Encounters:  07/15/21 205 lb 6.4 oz (93.2 kg)  07/08/21 205 lb 6.4 oz (93.2 kg)  07/01/21 206 lb 6.4 oz (93.6 kg)     ECOG FS:1 - Symptomatic but completely  ambulatory  Sclerae unicteric, EOMs intact Wearing a mask No cervical or supraclavicular adenopathy Lungs no rales or rhonchi Heart regular rate and rhythm Abd soft, nontender, positive bowel sounds MSK no focal spinal tenderness, no upper extremity lymphedema Neuro: nonfocal, well oriented, appropriate affect Breasts: I do not palpate a mass in the right breast.  There are no skin or nipple changes of concern.  The left breast and both axillae are benign   LAB RESULTS:  CMP     Component Value Date/Time   NA 140 07/15/2021 0826   K 4.2 07/15/2021 0826   CL 106 07/15/2021 0826   CO2 25 07/15/2021 0826   GLUCOSE 117 (H) 07/15/2021 0826   BUN 16 07/15/2021 0826   CREATININE 1.00 07/15/2021 0826   CALCIUM 9.5 07/15/2021 0826   PROT 7.0 07/15/2021 0826   ALBUMIN 3.8 07/15/2021 0826   AST 28 07/15/2021 0826   ALT 37 07/15/2021 0826   ALKPHOS 99 07/15/2021 0826   BILITOT 0.2 (L) 07/15/2021 0826   GFRNONAA >60 07/15/2021 0826   GFRAA 86 04/18/2008 1652    No results found for: TOTALPROTELP, ALBUMINELP, A1GS, A2GS, BETS, BETA2SER, GAMS, MSPIKE, SPEI  No results found for: Nils Pyle, [facility]  Lab Results  Component Value Date   WBC 8.2 07/15/2021   NEUTROABS PENDING 07/15/2021   HGB 11.0 (L) 07/15/2021   HCT 32.7 (L) 07/15/2021   MCV 89.8 07/15/2021   PLT 238 07/15/2021      Chemistry      Component Value Date/Time   NA 140 07/15/2021 0826   K 4.2 07/15/2021 0826   CL 106 07/15/2021 0826   CO2 25 07/15/2021 0826   BUN 16 07/15/2021 0826   CREATININE 1.00 07/15/2021 0826      Component Value Date/Time   CALCIUM 9.5 07/15/2021 0826   ALKPHOS 99 07/15/2021 0826   AST 28 07/15/2021 0826   ALT 37 07/15/2021 0826   BILITOT 0.2 (L) 07/15/2021 0826       No results found for: LABCA2  No components found for: KAJGOT157  No results for input(s): INR in the last 168 hours.  No results found for: LABCA2  No results found for: WIO035  No  results found for: DHR416  No results found for: LAG536  No results found for: CA2729  No components found for: HGQUANT  No results found for: CEA1 / No results found for: CEA1   No results found for: AFPTUMOR  No results found for: CHROMOGRNA  No results found for: TOTALPROTELP, ALBUMINELP, A1GS, A2GS, BETS, BETA2SER, GAMS, MSPIKE, SPEI (this displays SPEP labs)  No results found for: KPAFRELGTCHN, LAMBDASER, KAPLAMBRATIO (kappa/lambda light chains)  No results found for: HGBA, HGBA2QUANT, HGBFQUANT, HGBSQUAN (Hemoglobinopathy evaluation)   No results found for: LDH  No results found for: IRON, TIBC, IRONPCTSAT (Iron and TIBC)  No results found for: FERRITIN  Urinalysis    Component Value Date/Time   COLORURINE YELLOW 11/20/2007 0951   APPEARANCEUR Sl Cloudy 11/20/2007 0951   LABSPEC > OR = 1.030 11/20/2007 0951   PHURINE 5.5 11/20/2007 0951   GLUCOSEU NEGATIVE 11/20/2007 0951   BILIRUBINUR NEGATIVE 11/20/2007 0951   KETONESUR NEGATIVE 11/20/2007 0951   UROBILINOGEN 0.2 mg/dL 11/20/2007 0951   NITRITE Negative 11/20/2007 0951   LEUKOCYTESUR Negative 11/20/2007 0951    STUDIES: MR BREAST RIGHT W WO CONTRAST INC CAD  Result Date: 06/29/2021 CLINICAL DATA:  60 year old female with recently diagnosed right breast cancer presents for MRI guided biopsy of additional non-mass enhancement seen anterior to the primary malignancy on recent MRI. LABS:  None performed on site. EXAM: MR OF THE RIGHT BREAST WITH AND WITHOUT CONTRAST TECHNIQUE: Multiplanar, multisequence MR images of the right breast were obtained prior to and following the intravenous administration of 9 ml of Gadavist. Three-dimensional MR images were rendered by post-processing of the original MR data on an independent workstation. The three-dimensional MR images were interpreted, and findings are reported in the following complete MRI report for this study. Three dimensional images were evaluated at the  independent DynaCad workstation. COMPARISON:  Previous exam(s). FINDINGS: Breast composition: c. Heterogeneous fibroglandular tissue. Background parenchymal enhancement: Minimal. Right breast: Susceptibility artifact from post biopsy marking clip is seen in association with an enhancing mass measuring up to 3.3 cm in the upper outer quadrant posteriorly. No associated non mass enhancement is seen on today's study surrounding the mass or extending from it. Previously demonstrated non-mass enhancement they have been related to post biopsy changes. Ancillary findings:  None. IMPRESSION: Interval resolution of non-mass enhancement extending from the patient's biopsy proven right breast malignancy. The findings may have been related to post biopsy changes. RECOMMENDATION: Per clinical treatment plan. BI-RADS CATEGORY  6: Known biopsy-proven malignancy. Electronically Signed   By: Kristopher Oppenheim M.D.   On: 06/29/2021 10:33  DG CHEST PORT 1 VIEW  Result Date: 06/18/2021 CLINICAL DATA:  Port-A-Cath placement EXAM: PORTABLE CHEST 1 VIEW COMPARISON:  None. FINDINGS: Interval placement of a left chest port with catheter tip overlying the superior cavoatrial junction. The heart size and mediastinal contours are within normal limits. No focal pulmonary opacity. No pleural effusion or pneumothorax. The visualized skeletal structures are unremarkable. IMPRESSION: Status post placement of a left chest port.  Otherwise normal chest. Electronically Signed   By: Merilyn Baba M.D.   On: 06/18/2021 17:38   DG Fluoro Guide CV Line-No Report  Result Date: 06/18/2021 Fluoroscopy was utilized by the requesting physician.  No radiographic interpretation.   ECHOCARDIOGRAM COMPLETE  Result Date: 06/18/2021    ECHOCARDIOGRAM REPORT   Patient Name:   SHENOA HATTABAUGH Date of Exam: 06/18/2021 Medical Rec #:  [id number]       Height:       68.0 in Accession #:    [id number]  Weight:       201.6 lb Date of Birth:  Jun 11, 1961       BSA:           2.051 m Patient Age:    63 years        BP:           149/77 mmHg Patient Gender: F               HR:           56 bpm. Exam Location:  Outpatient Procedure: 2D Echo, Color Doppler, Cardiac Doppler and Strain Analysis Indications:    Chemo Z09  History:        Patient has no prior history of Echocardiogram examinations.                 Risk Factors:Dyslipidemia and Hypertension.  Sonographer:    Bernadene Person RDCS Referring Phys: Arab  1. Left ventricular ejection fraction, by estimation, is 55 to 60%. The left ventricle has normal function. The left ventricle has no regional wall motion abnormalities. Left ventricular diastolic parameters were normal. The average left ventricular global longitudinal strain is -21.1 %. The global longitudinal strain is normal.  2. Right ventricular systolic function is normal. The right ventricular size is normal.  3. The mitral valve is normal in structure. Trivial mitral valve regurgitation. No evidence of mitral stenosis.  4. The aortic valve is normal in structure. There is mild calcification of the aortic valve. Aortic valve regurgitation is not visualized. Mild aortic valve sclerosis is present, with no evidence of aortic valve stenosis.  5. The inferior vena cava is normal in size with greater than 50% respiratory variability, suggesting right atrial pressure of 3 mmHg. FINDINGS  Left Ventricle: Left ventricular ejection fraction, by estimation, is 55 to 60%. The left ventricle has normal function. The left ventricle has no regional wall motion abnormalities. The average left ventricular global longitudinal strain is -21.1 %. The global longitudinal strain is normal. The left ventricular internal cavity size was normal in size. There is no left ventricular hypertrophy. Left ventricular diastolic parameters were normal. Right Ventricle: The right ventricular size is normal. No increase in right ventricular wall thickness. Right  ventricular systolic function is normal. Left Atrium: Left atrial size was normal in size. Right Atrium: Right atrial size was normal in size. Pericardium: There is no evidence of pericardial effusion. Mitral Valve: The mitral valve is normal in structure. Trivial mitral valve regurgitation. No evidence of mitral valve stenosis. Tricuspid Valve: The tricuspid valve is normal in structure. Tricuspid valve regurgitation is not demonstrated. No evidence of tricuspid stenosis. Aortic Valve: The aortic valve is normal in structure. There is mild calcification of the aortic valve. Aortic valve regurgitation is not visualized. Mild aortic valve sclerosis is present, with no evidence of aortic valve stenosis. Pulmonic Valve: The pulmonic valve was normal in structure. Pulmonic valve regurgitation is trivial. No evidence of pulmonic stenosis. Aorta: The aortic root is normal in size and structure. Venous: The inferior vena cava is normal in size with greater than 50% respiratory variability, suggesting right atrial pressure of 3 mmHg. IAS/Shunts: No atrial level shunt detected by color flow Doppler.  LEFT VENTRICLE PLAX 2D LVIDd:         4.60 cm  Diastology LVIDs:         2.70 cm  LV e' medial:    6.12 cm/s LV PW:  1.10 cm  LV E/e' medial:  11.5 LV IVS:        1.00 cm  LV e' lateral:   8.66 cm/s LVOT diam:     2.00 cm  LV E/e' lateral: 8.2 LV SV:         68 LV SV Index:   33       2D Longitudinal Strain LVOT Area:     3.14 cm 2D Strain GLS Avg:     -21.1 %  RIGHT VENTRICLE RV S prime:     9.00 cm/s TAPSE (M-mode): 1.8 cm LEFT ATRIUM             Index       RIGHT ATRIUM           Index LA diam:        3.00 cm 1.46 cm/m  RA Area:     11.60 cm LA Vol (A2C):   42.0 ml 20.48 ml/m RA Volume:   19.70 ml  9.61 ml/m LA Vol (A4C):   35.9 ml 17.51 ml/m LA Biplane Vol: 39.5 ml 19.26 ml/m  AORTIC VALVE LVOT Vmax:   98.60 cm/s LVOT Vmean:  62.900 cm/s LVOT VTI:    0.218 m  AORTA Ao Root diam: 3.40 cm Ao Asc diam:  3.20 cm  MITRAL VALVE MV Area (PHT): 2.91 cm    SHUNTS MV Decel Time: 261 msec    Systemic VTI:  0.22 m MV E velocity: 70.60 cm/s  Systemic Diam: 2.00 cm MV A velocity: 77.50 cm/s MV E/A ratio:  0.91 Jenkins Rouge MD Electronically signed by Jenkins Rouge MD Signature Date/Time: 06/18/2021/12:34:34 PM    Final      ELIGIBLE FOR AVAILABLE RESEARCH PROTOCOL: no  ASSESSMENT: 60 y.o. [place] woman status post right breast upper inner quadrant biopsy 06/03/2021 for a clinical T2N0 invasive ductal carcinoma, grade 3, functionally triple negative, with an MIB-1 of 95%.  (1) genetics testing  (2) neoadjuvant chemotherapy to consist of carboplatin paclitaxel pembrolizumab for 4 cycles starting 06/23/2021 followed by doxorubicin and cyclophosphamide pembrolizumab for 4 cycles  (3) definitive surgery to follow  (4) adjuvant radiation as appropriate   PLAN: Shakisha is tolerating her treatment with no unusual side effects.  I again reviewed the issue of neuropathy with her and she will alert [place] if she develops any numbness or tingling in her finger pads or toe pads.  We went over her schedule and she understands she will be receiving chemotherapy until middle of January so that her surgery will be in February.  Her main concern is not to be treated in home coming weekend and it appears that will work well for her  She will see is on day 1 of each cycle but she will alert [place] if any problems develop in between those visits  Total encounter time 25 minutes.Chauncey Cruel, MD   07/15/2021 9:16 AM Medical Oncology and Hematology [facility] [place], [place] [address] Tel. [phone]    Fax. [phone]    I, Wilburn Mylar, am acting as scribe for Dr. Virgie Dad. Rebecah Dangerfield.  I, Lurline Del MD, have reviewed the above documentation for accuracy and completeness, and I agree with the above.   *Total Encounter Time as defined by the Centers for Medicare and Medicaid  Services includes, in addition to the face-to-face time of a patient visit (documented in the note above) non-face-to-face time: obtaining and reviewing outside history, ordering and reviewing medications,  tests or procedures, care coordination (communications with other health care professionals or caregivers) and documentation in the medical record.

## 2021-07-15 ENCOUNTER — Inpatient Hospital Stay: Payer: BC Managed Care – PPO

## 2021-07-15 ENCOUNTER — Inpatient Hospital Stay (HOSPITAL_BASED_OUTPATIENT_CLINIC_OR_DEPARTMENT_OTHER): Payer: BC Managed Care – PPO | Admitting: Oncology

## 2021-07-15 ENCOUNTER — Other Ambulatory Visit: Payer: Self-pay

## 2021-07-15 VITALS — BP 135/77 | HR 83 | Temp 98.2°F | Resp 18 | Ht 68.0 in | Wt 205.4 lb

## 2021-07-15 DIAGNOSIS — Z171 Estrogen receptor negative status [ER-]: Secondary | ICD-10-CM | POA: Diagnosis not present

## 2021-07-15 DIAGNOSIS — Z95828 Presence of other vascular implants and grafts: Secondary | ICD-10-CM

## 2021-07-15 DIAGNOSIS — C50211 Malignant neoplasm of upper-inner quadrant of right female breast: Secondary | ICD-10-CM | POA: Diagnosis not present

## 2021-07-15 DIAGNOSIS — Z5112 Encounter for antineoplastic immunotherapy: Secondary | ICD-10-CM | POA: Diagnosis not present

## 2021-07-15 LAB — CBC WITH DIFFERENTIAL (CANCER CENTER ONLY)
Abs Immature Granulocytes: 1.7 10*3/uL — ABNORMAL HIGH (ref 0.00–0.07)
Basophils Absolute: 0.1 10*3/uL (ref 0.0–0.1)
Basophils Relative: 1 %
Eosinophils Absolute: 0.1 10*3/uL (ref 0.0–0.5)
Eosinophils Relative: 1 %
HCT: 32.7 % — ABNORMAL LOW (ref 36.0–46.0)
Hemoglobin: 11 g/dL — ABNORMAL LOW (ref 12.0–15.0)
Immature Granulocytes: 21 %
Lymphocytes Relative: 22 %
Lymphs Abs: 1.8 10*3/uL (ref 0.7–4.0)
MCH: 30.2 pg (ref 26.0–34.0)
MCHC: 33.6 g/dL (ref 30.0–36.0)
MCV: 89.8 fL (ref 80.0–100.0)
Monocytes Absolute: 0.7 10*3/uL (ref 0.1–1.0)
Monocytes Relative: 9 %
Neutro Abs: 3.9 10*3/uL (ref 1.7–7.7)
Neutrophils Relative %: 46 %
Platelet Count: 238 10*3/uL (ref 150–400)
RBC: 3.64 MIL/uL — ABNORMAL LOW (ref 3.87–5.11)
RDW: 15.5 % (ref 11.5–15.5)
WBC Count: 8.2 10*3/uL (ref 4.0–10.5)
nRBC: 0.7 % — ABNORMAL HIGH (ref 0.0–0.2)

## 2021-07-15 LAB — CMP (CANCER CENTER ONLY)
ALT: 37 U/L (ref 0–44)
AST: 28 U/L (ref 15–41)
Albumin: 3.8 g/dL (ref 3.5–5.0)
Alkaline Phosphatase: 99 U/L (ref 38–126)
Anion gap: 9 (ref 5–15)
BUN: 16 mg/dL (ref 6–20)
CO2: 25 mmol/L (ref 22–32)
Calcium: 9.5 mg/dL (ref 8.9–10.3)
Chloride: 106 mmol/L (ref 98–111)
Creatinine: 1 mg/dL (ref 0.44–1.00)
GFR, Estimated: >60 mL/min (ref >60)
Glucose, Bld: 117 mg/dL — ABNORMAL HIGH (ref 70–99)
Potassium: 4.2 mmol/L (ref 3.5–5.1)
Sodium: 140 mmol/L (ref 135–145)
Total Bilirubin: 0.2 mg/dL — ABNORMAL LOW (ref 0.3–1.2)
Total Protein: 7 g/dL (ref 6.5–8.1)

## 2021-07-15 LAB — TSH: TSH: 1.36 u[IU]/mL (ref 0.308–3.960)

## 2021-07-15 MED ORDER — FAMOTIDINE 20 MG IN NS 100 ML IVPB
20.0000 mg | Freq: Once | INTRAVENOUS | Status: AC
  Administered 2021-07-15: 20 mg via INTRAVENOUS
  Filled 2021-07-15: qty 100

## 2021-07-15 MED ORDER — SODIUM CHLORIDE 0.9 % IV SOLN
10.0000 mg | Freq: Once | INTRAVENOUS | Status: AC
  Administered 2021-07-15: 10 mg via INTRAVENOUS
  Filled 2021-07-15: qty 10

## 2021-07-15 MED ORDER — SODIUM CHLORIDE 0.9% FLUSH
10.0000 mL | Freq: Once | INTRAVENOUS | Status: AC
  Administered 2021-07-15: 10 mL

## 2021-07-15 MED ORDER — FOSAPREPITANT DIMEGLUMINE INJECTION 150 MG
150.0000 mg | Freq: Once | INTRAVENOUS | Status: AC
  Administered 2021-07-15: 150 mg via INTRAVENOUS
  Filled 2021-07-15: qty 150

## 2021-07-15 MED ORDER — SODIUM CHLORIDE 0.9 % IV SOLN: Freq: Once | INTRAVENOUS | Status: AC

## 2021-07-15 MED ORDER — SODIUM CHLORIDE 0.9 % IV SOLN
200.0000 mg | Freq: Once | INTRAVENOUS | Status: AC
  Administered 2021-07-15: 200 mg via INTRAVENOUS
  Filled 2021-07-15: qty 8

## 2021-07-15 MED ORDER — SODIUM CHLORIDE 0.9 % IV SOLN
80.0000 mg/m2 | Freq: Once | INTRAVENOUS | Status: AC
  Administered 2021-07-15: 168 mg via INTRAVENOUS
  Filled 2021-07-15: qty 28

## 2021-07-15 MED ORDER — PALONOSETRON HCL INJECTION 0.25 MG/5ML
0.2500 mg | Freq: Once | INTRAVENOUS | Status: AC
  Administered 2021-07-15: 0.25 mg via INTRAVENOUS
  Filled 2021-07-15: qty 5

## 2021-07-15 MED ORDER — DIPHENHYDRAMINE HCL 50 MG/ML IJ SOLN
25.0000 mg | Freq: Once | INTRAMUSCULAR | Status: AC
  Administered 2021-07-15: 25 mg via INTRAVENOUS
  Filled 2021-07-15: qty 1

## 2021-07-15 MED ORDER — SODIUM CHLORIDE 0.9 % IV SOLN
510.0000 mg | Freq: Once | INTRAVENOUS | Status: AC
  Administered 2021-07-15: 510 mg via INTRAVENOUS
  Filled 2021-07-15: qty 51

## 2021-07-16 LAB — T4: T4, Total: 7.1 ug/dL (ref 4.5–12.0)

## 2021-07-17 ENCOUNTER — Encounter: Payer: Self-pay | Admitting: Oncology

## 2021-07-17 NOTE — Progress Notes (Signed)
Pt has been enrolled w/ the [employer] program for [facility] for $25,000 from 11/01/20 - 10/31/21.  Her copay for [name] will be $25.

## 2021-07-21 ENCOUNTER — Telehealth: Payer: Self-pay | Admitting: *Deleted

## 2021-07-21 MED FILL — Dexamethasone Sodium Phosphate Inj 100 MG/10ML: INTRAMUSCULAR | Qty: 1 | Status: AC

## 2021-07-21 NOTE — Telephone Encounter (Signed)
This RN contacted pt per her communication with the on call service per loss of dental cap.  She states the cap is on her front tooth- she went to the dentist yesterday and " she discussed possible need to extract the remaining tooth "  She was not put on antibiotic.  She is scheduled for d8 of cycle 2 of carbo/taxol/keytruda 07/22/2021.  Per above and pt concern - pt put on schedule with [facility]/NP for review prior to d8 treatment.

## 2021-07-22 ENCOUNTER — Other Ambulatory Visit: Payer: Self-pay

## 2021-07-22 ENCOUNTER — Inpatient Hospital Stay: Payer: BC Managed Care – PPO

## 2021-07-22 ENCOUNTER — Encounter: Payer: Self-pay | Admitting: Adult Health

## 2021-07-22 ENCOUNTER — Inpatient Hospital Stay (HOSPITAL_BASED_OUTPATIENT_CLINIC_OR_DEPARTMENT_OTHER): Payer: BC Managed Care – PPO | Admitting: Adult Health

## 2021-07-22 VITALS — BP 133/75 | HR 100 | Temp 97.5°F | Resp 18 | Ht 68.0 in | Wt 201.6 lb

## 2021-07-22 DIAGNOSIS — Z95828 Presence of other vascular implants and grafts: Secondary | ICD-10-CM

## 2021-07-22 DIAGNOSIS — Z171 Estrogen receptor negative status [ER-]: Secondary | ICD-10-CM | POA: Diagnosis not present

## 2021-07-22 DIAGNOSIS — Z5112 Encounter for antineoplastic immunotherapy: Secondary | ICD-10-CM | POA: Diagnosis not present

## 2021-07-22 DIAGNOSIS — C50211 Malignant neoplasm of upper-inner quadrant of right female breast: Secondary | ICD-10-CM

## 2021-07-22 LAB — CMP (CANCER CENTER ONLY)
ALT: 36 U/L (ref 0–44)
AST: 21 U/L (ref 15–41)
Albumin: 3.8 g/dL (ref 3.5–5.0)
Alkaline Phosphatase: 89 U/L (ref 38–126)
Anion gap: 10 (ref 5–15)
BUN: 20 mg/dL (ref 6–20)
CO2: 24 mmol/L (ref 22–32)
Calcium: 9.4 mg/dL (ref 8.9–10.3)
Chloride: 103 mmol/L (ref 98–111)
Creatinine: 1.01 mg/dL — ABNORMAL HIGH (ref 0.44–1.00)
GFR, Estimated: >60 mL/min (ref >60)
Glucose, Bld: 124 mg/dL — ABNORMAL HIGH (ref 70–99)
Potassium: 3.6 mmol/L (ref 3.5–5.1)
Sodium: 137 mmol/L (ref 135–145)
Total Bilirubin: 0.4 mg/dL (ref 0.3–1.2)
Total Protein: 6.8 g/dL (ref 6.5–8.1)

## 2021-07-22 LAB — CBC WITH DIFFERENTIAL (CANCER CENTER ONLY)
Abs Immature Granulocytes: 0.04 10*3/uL (ref 0.00–0.07)
Basophils Absolute: 0 10*3/uL (ref 0.0–0.1)
Basophils Relative: 0 %
Eosinophils Absolute: 0.1 10*3/uL (ref 0.0–0.5)
Eosinophils Relative: 1 %
HCT: 32.4 % — ABNORMAL LOW (ref 36.0–46.0)
Hemoglobin: 11 g/dL — ABNORMAL LOW (ref 12.0–15.0)
Immature Granulocytes: 1 %
Lymphocytes Relative: 25 %
Lymphs Abs: 1.7 10*3/uL (ref 0.7–4.0)
MCH: 29.9 pg (ref 26.0–34.0)
MCHC: 34 g/dL (ref 30.0–36.0)
MCV: 88 fL (ref 80.0–100.0)
Monocytes Absolute: 0.2 10*3/uL (ref 0.1–1.0)
Monocytes Relative: 3 %
Neutro Abs: 4.7 10*3/uL (ref 1.7–7.7)
Neutrophils Relative %: 70 %
Platelet Count: 192 10*3/uL (ref 150–400)
RBC: 3.68 MIL/uL — ABNORMAL LOW (ref 3.87–5.11)
RDW: 15.2 % (ref 11.5–15.5)
WBC Count: 6.7 10*3/uL (ref 4.0–10.5)
nRBC: 0 % (ref 0.0–0.2)

## 2021-07-22 LAB — TSH: TSH: 1.599 u[IU]/mL (ref 0.308–3.960)

## 2021-07-22 MED ORDER — FAMOTIDINE 20 MG IN NS 100 ML IVPB
20.0000 mg | Freq: Once | INTRAVENOUS | Status: AC
  Administered 2021-07-22: 20 mg via INTRAVENOUS
  Filled 2021-07-22: qty 100

## 2021-07-22 MED ORDER — SODIUM CHLORIDE 0.9% FLUSH: 10.0000 mL | Freq: Once | INTRAVENOUS | Status: DC

## 2021-07-22 MED ORDER — HEPARIN SOD (PORK) LOCK FLUSH 100 UNIT/ML IV SOLN
500.0000 [IU] | Freq: Once | INTRAVENOUS | Status: AC | PRN
  Administered 2021-07-22: 500 [IU]

## 2021-07-22 MED ORDER — SODIUM CHLORIDE 0.9 % IV SOLN: Freq: Once | INTRAVENOUS | Status: AC

## 2021-07-22 MED ORDER — SODIUM CHLORIDE 0.9 % IV SOLN
80.0000 mg/m2 | Freq: Once | INTRAVENOUS | Status: AC
  Administered 2021-07-22: 168 mg via INTRAVENOUS
  Filled 2021-07-22: qty 28

## 2021-07-22 MED ORDER — DIPHENHYDRAMINE HCL 50 MG/ML IJ SOLN
25.0000 mg | Freq: Once | INTRAMUSCULAR | Status: AC
  Administered 2021-07-22: 25 mg via INTRAVENOUS
  Filled 2021-07-22: qty 1

## 2021-07-22 MED ORDER — SODIUM CHLORIDE 0.9% FLUSH
10.0000 mL | INTRAVENOUS | Status: DC | PRN
  Administered 2021-07-22: 10 mL

## 2021-07-22 MED ORDER — SODIUM CHLORIDE 0.9 % IV SOLN
10.0000 mg | Freq: Once | INTRAVENOUS | Status: AC
  Administered 2021-07-22: 10 mg via INTRAVENOUS
  Filled 2021-07-22: qty 10

## 2021-07-22 NOTE — Patient Instructions (Signed)
[facility] MEDICAL ONCOLOGY   Discharge Instructions: Thank you for choosing [facility] to provide your oncology and hematology care.   If you have a lab appointment with the Cancer Center, please go directly to the Cancer Center and check in at the registration area.   Wear comfortable clothing and clothing appropriate for easy access to any Portacath or PICC line.   We strive to give you quality time with your provider. You may need to reschedule your appointment if you arrive late (15 or more minutes).  Arriving late affects you and other patients whose appointments are after yours.  Also, if you miss three or more appointments without notifying the office, you may be dismissed from the clinic at the provider's discretion.      For prescription refill requests, have your pharmacy contact our office and allow 72 hours for refills to be completed.    Today you received the following chemotherapy and/or immunotherapy agents: paclitaxel.      To help prevent nausea and vomiting after your treatment, we encourage you to take your nausea medication as directed.  BELOW ARE SYMPTOMS THAT SHOULD BE REPORTED IMMEDIATELY: *FEVER GREATER THAN 100.4 F (38 C) OR HIGHER *CHILLS OR SWEATING *NAUSEA AND VOMITING THAT IS NOT CONTROLLED WITH YOUR NAUSEA MEDICATION *UNUSUAL SHORTNESS OF BREATH *UNUSUAL BRUISING OR BLEEDING *URINARY PROBLEMS (pain or burning when urinating, or frequent urination) *BOWEL PROBLEMS (unusual diarrhea, constipation, pain near the anus) TENDERNESS IN MOUTH AND THROAT WITH OR WITHOUT PRESENCE OF ULCERS (sore throat, sores in mouth, or a toothache) UNUSUAL RASH, SWELLING OR PAIN  UNUSUAL VAGINAL DISCHARGE OR ITCHING   Items with * indicate a potential emergency and should be followed up as soon as possible or go to the Emergency Department if any problems should occur.  Please show the CHEMOTHERAPY ALERT CARD or IMMUNOTHERAPY ALERT CARD at check-in  to the Emergency Department and triage nurse.  Should you have questions after your visit or need to cancel or reschedule your appointment, please contact [facility] MEDICAL ONCOLOG[facility]  Dept: [phone]  and follow the prompts.  Office hours are 8:00 a.m. to 4:30 p.m. Monday - Friday. Please note that voicemails left after 4:00 p.m. may not be returned until the following business day.  We are closed weekends and major holidays. You have access to a nurse at all times for urgent questions. Please call the main number to the clinic Dept: [phone] and follow the prompts.   For any non-urgent questions, you may also contact your provider using MyChart. We now offer e-Visits for anyone 18 and older to request care online for non-urgent symptoms. For details visit [email or web address].   Also download the MyChart app! Go to the app store, search "MyChart", open the app, select [facility], and log in with your MyChart username and password.  Due to Covid, a mask is required upon entering the hospital/clinic. If you do not have a mask, one will be given to you upon arrival. For doctor visits, patients may have 1 support person aged 18 or older with them. For treatment visits, patients cannot have anyone with them due to current Covid guidelines and our immunocompromised population.   

## 2021-07-22 NOTE — Progress Notes (Signed)
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   She began neoadjuvant chemotherapy, consisting of carboplatin paclitaxel pembrolizumab for 4 cycles, on 06/24/2021.  This will be followed by 4 cycles of doxorubicin and cyclophosphamide again with pembrolizumab.   Today is day 8 cycle 2.  She is concerned about her tooth because a cap came off, and she saw the dentist who wanted to extract her tooth.  She is concerned about this considering that she is on chemotherapy.  She wants me to call and figure out what is going on with her tooth.  Her dental office is [place] [phone].    Jaydalee notes she is tolerating the chemotherapy moderately well.  She has no peripheral neuropathy.      REVIEW OF SYSTEMS: Review of Systems  Constitutional:  Negative for appetite change, chills, fatigue, fever and unexpected weight change.  HENT:   Negative for hearing loss, lump/mass and trouble swallowing.   Eyes:  Negative for eye problems and icterus.  Respiratory:  Negative for chest tightness, cough and shortness of breath.   Cardiovascular:  Negative for chest pain, leg swelling and palpitations.  Gastrointestinal:  Negative for abdominal distention, abdominal pain, constipation, diarrhea, nausea and vomiting.   Endocrine: Negative for hot flashes.  Genitourinary:  Negative for difficulty urinating.   Musculoskeletal:  Negative for arthralgias.  Skin:  Negative for itching and rash.  Neurological:  Negative for dizziness, extremity weakness, headaches and numbness.  Hematological:  Negative for adenopathy. Does not bruise/bleed easily.  Psychiatric/Behavioral:  Negative for depression. The patient is not nervous/anxious.      COVID 19 VACCINATION STATUS:  Status post Pfizer x3 as of August 2022   HISTORY OF CURRENT ILLNESS: From the original intake note:  Rita Davis herself noted a mass in her right breast.  She brought it to medical attention and her mammogram was moved up to 06/03/2021 at the breast center.  This found the breast to be density category C.  On physical exam there was a palpable firm fixed mass in the upper inner quadrant of the right breast.  On the mammogram there was an irregular mass with spiculated borders which on ultrasound measured 2.9 cm at the 1 o'clock position 10 cm from the nipple.  The right axilla was sonographically benign.  Biopsy of the right breast mass in question 06/03/2021 showed an invasive ductal carcinoma, grade 3, estrogen receptor "positive" at 5% with weak staining intensity (functionally estrogen receptor negative), progesterone receptor negative, HER2 equivocal by immunotherapy but negative by FISH, with a proliferation marker of 95%.  Cancer Staging Malignant neoplasm of upper-inner quadrant of right breast in female, estrogen receptor negative ([place]) Staging form: Breast, AJCC 8th Edition - Clinical: Stage IIB (cT2, cN0, cM0, G3, ER-, PR-, HER2-) - Signed by Chauncey Cruel, MD on 06/09/2021  The patient's subsequent history is as detailed below   PAST MEDICAL HISTORY: Past Medical  History:  Diagnosis Date   Anemia    had a fibroid tumor, was anemic at that time   Anxiety    Cancer [facility])    breast cancer   Family history of breast cancer     Family history of colon cancer    Family history of prostate cancer    Hyperlipidemia    Hypertension    Pneumonia    as a child   Pre-diabetes     PAST SURGICAL HISTORY: Past Surgical History:  Procedure Laterality Date   ABDOMINAL HYSTERECTOMY     still has ovaries   BREAST BIOPSY Right 05/23/2012   BREAST CYST EXCISION Right    cyst removed     from lower back   DILATION AND CURETTAGE OF UTERUS     ORIF ANKLE FRACTURE Left 11/13/2020   Procedure: OPEN TREATMENT OF LEFT TRIMALLEOLAR ANKLE FRACTURE WITH POSTERIOR FIXATION, SYNDESMOSIS;  Surgeon: Erle Crocker, MD;  Location: [place];  Service: Orthopedics;  Laterality: Left;  LENGTH OF SURGERY: 1.5 HOURS   PORTACATH PLACEMENT Left 06/18/2021   Procedure: PORT PLACEMENT;  Surgeon: Stark Klein, MD;  Location: [place];  Service: General;  Laterality: Left;    FAMILY HISTORY Family History  Problem Relation Age of Onset   Hypertension Mother    Breast cancer Mother 36       declined treatment   Prostate cancer Father        metastatic, dx 10s   Colon cancer Brother 8   Cancer Maternal Great-grandmother 47       gynecologic cancer (MGM's mother)  The patient's father died at the age of 3, having had breast cancer twice.  The patient has little information on his side of the family.  The patient's mother is 43 years old as of August 2022.  She is currently under hospice care for metastatic breast cancer having refused active treatment (she was evaluated by Dr. Lindi Adie).  Also on the maternal side there is a great grandmother with ovarian cancer.  The patient had one half brother who died from colon cancer at the age of 79.  Another half brother survives.   GYNECOLOGIC HISTORY:  No LMP recorded. Patient has had a hysterectomy. Menarche: 60 years old Age at first live birth: 60 years old [place] P 2 LMP 01-16-15 Contraceptive several years with no complications HRT no Hysterectomy?   Yes Salpingo-oophorectomy?no   SOCIAL HISTORY:  Rita Davis retired as Psychologist, counselling in 01/16/2020.  Her husband died 2020/03/15 from a cardiac arrest in the setting of renal failure.  The patient lives by herself with no pets.  Son Timmothy Sours graduated from [employer] and works for [employer] in [place].  Son Lovena Le graduated from [employer] with a business administration degree and placed in a band    ADVANCED DIRECTIVES: Not in place.  As of 06/09/2021 the patient has not discussed her diagnosis of breast cancer with her children.   HEALTH MAINTENANCE: Social History   Tobacco Use   Smoking status: Never   Smokeless tobacco: Never  Substance Use Topics   Alcohol use: No   Drug use: No     Colonoscopy:  PAP:  Bone density:   No Known Allergies  Current Outpatient Medications  Medication Sig Dispense Refill   amLODipine-olmesartan (AZOR) 5-40 MG tablet Take 1 tablet by mouth in the morning.     betamethasone valerate (VALISONE) 0.1 % cream Apply 1 application topically daily as needed (skin irritation.).  bimatoprost (LATISSE) 0.03 % ophthalmic solution Place one drop on applicator and apply evenly along the skin of the upper eyelid at base of eyelashes once daily at bedtime; repeat procedure for second eye (use a clean applicator).     Calcium-Magnesium-Zinc (CAL-MAG-ZINC PO) Take 1 tablet by mouth every other day. In the morning     Cholecalciferol (VITAMIN D3) 250 MCG (10000 UT) TABS Take 10,000 Units by mouth daily.     dexamethasone (DECADRON) 4 MG tablet Take 2 tablets once a day for 3 days after carboplatin and AC chemotherapy. Take with food. 30 tablet 1   escitalopram (LEXAPRO) 10 MG tablet Take 10 mg by mouth at bedtime.     lidocaine-prilocaine (EMLA) cream Apply to affected area once 30 g 3   Misc Natural Products (SAMBUCUS ELDERBERRY IMMUNE PO) Take 5 mLs by mouth daily.     Misc Natural Products (SUPER GREENS) POWD Take 1 Scoop by mouth daily.     oxyCODONE (OXY  IR/ROXICODONE) 5 MG immediate release tablet Take 1 tablet (5 mg total) by mouth every 6 (six) hours as needed for severe pain. 5 tablet 0   pravastatin (PRAVACHOL) 20 MG tablet Take 20 mg by mouth once a week.     prochlorperazine (COMPAZINE) 10 MG tablet Take 1 tablet (10 mg total) by mouth every 6 (six) hours as needed (Nausea or vomiting). 30 tablet 1   No current facility-administered medications for this visit.    OBJECTIVE:   Vitals:   07/22/21 0848  BP: 133/75  Pulse: 100  Resp: 18  Temp: (!) 97.5 F (36.4 C)  SpO2: 100%      Body mass index is 30.65 kg/m.   Wt Readings from Last 3 Encounters:  07/22/21 201 lb 9.6 oz (91.4 kg)  07/15/21 205 lb 6.4 oz (93.2 kg)  07/08/21 205 lb 6.4 oz (93.2 kg)     ECOG FS:1 - Symptomatic but completely ambulatory GENERAL: Patient is a well appearing female in no acute distress HEENT:  Sclerae anicteric.  Oropharynx clear and moist. No ulcerations or evidence of oropharyngeal candidiasis. Neck is supple.  NODES:  No cervical, supraclavicular, or axillary lymphadenopathy palpated.  BREAST EXAM:  Deferred. LUNGS:  Clear to auscultation bilaterally.  No wheezes or rhonchi. HEART:  Regular rate and rhythm. No murmur appreciated. ABDOMEN:  Soft, nontender.  Positive, normoactive bowel sounds. No organomegaly palpated. MSK:  No focal spinal tenderness to palpation. Full range of motion bilaterally in the upper extremities. EXTREMITIES:  No peripheral edema.   SKIN:  Clear with no obvious rashes or skin changes. No nail dyscrasia. NEURO:  Nonfocal. Well oriented.  Appropriate affect.    LAB RESULTS:  CMP     Component Value Date/Time   NA 137 07/22/2021 0806   K 3.6 07/22/2021 0806   CL 103 07/22/2021 0806   CO2 24 07/22/2021 0806   GLUCOSE 124 (H) 07/22/2021 0806   BUN 20 07/22/2021 0806   CREATININE 1.01 (H) 07/22/2021 0806   CALCIUM 9.4 07/22/2021 0806   PROT 6.8 07/22/2021 0806   ALBUMIN 3.8 07/22/2021 0806   AST 21  07/22/2021 0806   ALT 36 07/22/2021 0806   ALKPHOS 89 07/22/2021 0806   BILITOT 0.4 07/22/2021 0806   GFRNONAA >60 07/22/2021 0806   GFRAA 86 04/18/2008 1652    No results found for: TOTALPROTELP, ALBUMINELP, A1GS, A2GS, BETS, BETA2SER, GAMS, MSPIKE, SPEI  No results found for: KPAFRELGTCHN, LAMBDASER, KAPLAMBRATIO  Lab Results  Component Value Date  WBC 6.7 07/22/2021   NEUTROABS 4.7 07/22/2021   HGB 11.0 (L) 07/22/2021   HCT 32.4 (L) 07/22/2021   MCV 88.0 07/22/2021   PLT 192 07/22/2021      Chemistry      Component Value Date/Time   NA 137 07/22/2021 0806   K 3.6 07/22/2021 0806   CL 103 07/22/2021 0806   CO2 24 07/22/2021 0806   BUN 20 07/22/2021 0806   CREATININE 1.01 (H) 07/22/2021 0806      Component Value Date/Time   CALCIUM 9.4 07/22/2021 0806   ALKPHOS 89 07/22/2021 0806   AST 21 07/22/2021 0806   ALT 36 07/22/2021 0806   BILITOT 0.4 07/22/2021 0806       No results found for: LABCA2  No components found for: QMGQQP619  No results for input(s): INR in the last 168 hours.  No results found for: LABCA2  No results found for: JKD326  No results found for: ZTI458  No results found for: KDX833  No results found for: CA2729  No components found for: HGQUANT  No results found for: CEA1 / No results found for: CEA1   No results found for: AFPTUMOR  No results found for: CHROMOGRNA  No results found for: TOTALPROTELP, ALBUMINELP, A1GS, A2GS, BETS, BETA2SER, GAMS, MSPIKE, SPEI (this displays SPEP labs)  No results found for: KPAFRELGTCHN, LAMBDASER, KAPLAMBRATIO (kappa/lambda light chains)  No results found for: HGBA, HGBA2QUANT, HGBFQUANT, HGBSQUAN (Hemoglobinopathy evaluation)   No results found for: LDH  No results found for: IRON, TIBC, IRONPCTSAT (Iron and TIBC)  No results found for: FERRITIN  Urinalysis    Component Value Date/Time   COLORURINE YELLOW 11/20/2007 0951   APPEARANCEUR Sl Cloudy 11/20/2007 0951   LABSPEC > OR  = 1.030 11/20/2007 0951   PHURINE 5.5 11/20/2007 0951   GLUCOSEU NEGATIVE 11/20/2007 0951   BILIRUBINUR NEGATIVE 11/20/2007 0951   KETONESUR NEGATIVE 11/20/2007 0951   UROBILINOGEN 0.2 mg/dL 11/20/2007 0951   NITRITE Negative 11/20/2007 0951   LEUKOCYTESUR Negative 11/20/2007 0951    STUDIES: MR BREAST RIGHT W WO CONTRAST INC CAD  Result Date: 06/29/2021 CLINICAL DATA:  60 year old female with recently diagnosed right breast cancer presents for MRI guided biopsy of additional non-mass enhancement seen anterior to the primary malignancy on recent MRI. LABS:  None performed on site. EXAM: MR OF THE RIGHT BREAST WITH AND WITHOUT CONTRAST TECHNIQUE: Multiplanar, multisequence MR images of the right breast were obtained prior to and following the intravenous administration of 9 ml of Gadavist. Three-dimensional MR images were rendered by post-processing of the original MR data on an independent workstation. The three-dimensional MR images were interpreted, and findings are reported in the following complete MRI report for this study. Three dimensional images were evaluated at the independent DynaCad workstation. COMPARISON:  Previous exam(s). FINDINGS: Breast composition: c. Heterogeneous fibroglandular tissue. Background parenchymal enhancement: Minimal. Right breast: Susceptibility artifact from post biopsy marking clip is seen in association with an enhancing mass measuring up to 3.3 cm in the upper outer quadrant posteriorly. No associated non mass enhancement is seen on today's study surrounding the mass or extending from it. Previously demonstrated non-mass enhancement they have been related to post biopsy changes. Ancillary findings:  None. IMPRESSION: Interval resolution of non-mass enhancement extending from the patient's biopsy proven right breast malignancy. The findings may have been related to post biopsy changes. RECOMMENDATION: Per clinical treatment plan. BI-RADS CATEGORY  6: Known  biopsy-proven malignancy. Electronically Signed   By: Shayne Alken.D.  On: 06/29/2021 10:33    ELIGIBLE FOR AVAILABLE RESEARCH PROTOCOL: no  ASSESSMENT: 60 y.o. [place] woman status post right breast upper inner quadrant biopsy 06/03/2021 for a clinical T2N0 invasive ductal carcinoma, grade 3, functionally triple negative, with an MIB-1 of 95%.  (1) genetics testing  (2) neoadjuvant chemotherapy to consist of carboplatin paclitaxel pembrolizumab for 4 cycles starting 06/23/2021 followed by doxorubicin and cyclophosphamide pembrolizumab for 4 cycles  (3) definitive surgery to follow  (4) adjuvant radiation as appropriate   PLAN: Jaime continues on chemotherapy with Pembrolizumab, Carbo, and Paclitaxel.  Today she is due for Paclitaxel alone.  She continues to tolerate this well.  Her labs remaine adequate for treatment.  Her WBC and ANC is normal today.    We discussed the issue of her tooth.  I reviewed with her that I cannot make any alterations or modifications to her tooth.  I did review that the biggest thing to consider prior to any procedure/treatment is weighing the risks versus the benefits.  I discussed with Danylah that the opportune time to undergo dental work is during her week off from chemotherapy, just prior to starting back.  I reviewed that even then at that point, the benefits should outweigh the risks.  I reviewed that regardless, I'd recommend that she take antibiotics while doing under chemotherapy.  She understands this.   I had Stephania sign some paperwork so that we can get information from the dentist office about the imaging and plan, so that we can coordinate based on what is best for her with the lowest amount of risk.  I asked my nurse to fax the form over and get more information from the dental office.    She will continue to see [place] on day one of her treatments.  She knows to call for any questions that may arise between now and her next appointment.  We  are happy to see her sooner if needed.   Total encounter time 30 minutes.Wilber Bihari, NP 07/22/21 9:14 AM Medical Oncology and Hematology [facility] [place], [place] [address] Tel. [phone]    Fax. [phone]    *Total Encounter Time as defined by the Centers for Medicare and Medicaid Services includes, in addition to the face-to-face time of a patient visit (documented in the note above) non-face-to-face time: obtaining and reviewing outside history, ordering and reviewing medications, tests or procedures, care coordination (communications with other health care professionals or caregivers) and documentation in the medical record.

## 2021-07-23 LAB — T4: T4, Total: 7.8 ug/dL (ref 4.5–12.0)

## 2021-07-24 ENCOUNTER — Encounter: Payer: Self-pay | Admitting: Oncology

## 2021-07-24 NOTE — Progress Notes (Signed)
Pt has been enrolled w/ the [place] [employer] program for [place] for $10,000 from 07/23/21 - 10/31/21.  Her copay for Rita Davis will be $0.

## 2021-07-27 ENCOUNTER — Telehealth: Payer: Self-pay | Admitting: *Deleted

## 2021-07-27 NOTE — Telephone Encounter (Signed)
Pt called to this RN to state she noted today " when I was walking my usual 2 miles a day- that my middle toe has numbness on the very tip "  She states this it the foot of the ankle that she had fractured " and this is the first time it has felt like this and it is only this toe "  This RN informed her above would be given to MD for review with noted next Taxol infusion scheduled for this Wednesday without a provider visit.  Pt is scheduled for follow up 08/04/2021 with [facility]/NP.  Of note pt is also stating plans "if ok'ed by Dr Jana Hakim " to go to [facility] from 10/15 to 10/18.  This note will be given to MD for review of above.

## 2021-07-28 MED FILL — Dexamethasone Sodium Phosphate Inj 100 MG/10ML: INTRAMUSCULAR | Qty: 1 | Status: AC

## 2021-07-29 ENCOUNTER — Inpatient Hospital Stay: Payer: BC Managed Care – PPO

## 2021-07-29 ENCOUNTER — Encounter: Payer: Self-pay | Admitting: *Deleted

## 2021-07-29 ENCOUNTER — Other Ambulatory Visit: Payer: Self-pay

## 2021-07-29 VITALS — BP 146/90 | HR 99 | Temp 98.2°F | Resp 17

## 2021-07-29 DIAGNOSIS — Z171 Estrogen receptor negative status [ER-]: Secondary | ICD-10-CM

## 2021-07-29 DIAGNOSIS — Z5112 Encounter for antineoplastic immunotherapy: Secondary | ICD-10-CM | POA: Diagnosis not present

## 2021-07-29 DIAGNOSIS — Z95828 Presence of other vascular implants and grafts: Secondary | ICD-10-CM

## 2021-07-29 DIAGNOSIS — C50211 Malignant neoplasm of upper-inner quadrant of right female breast: Secondary | ICD-10-CM

## 2021-07-29 LAB — CBC WITH DIFFERENTIAL (CANCER CENTER ONLY)
Abs Immature Granulocytes: 0.1 10*3/uL — ABNORMAL HIGH (ref 0.00–0.07)
Basophils Absolute: 0 10*3/uL (ref 0.0–0.1)
Basophils Relative: 1 %
Eosinophils Absolute: 0.1 10*3/uL (ref 0.0–0.5)
Eosinophils Relative: 1 %
HCT: 29.5 % — ABNORMAL LOW (ref 36.0–46.0)
Hemoglobin: 10.2 g/dL — ABNORMAL LOW (ref 12.0–15.0)
Immature Granulocytes: 2 %
Lymphocytes Relative: 31 %
Lymphs Abs: 1.6 10*3/uL (ref 0.7–4.0)
MCH: 30.9 pg (ref 26.0–34.0)
MCHC: 34.6 g/dL (ref 30.0–36.0)
MCV: 89.4 fL (ref 80.0–100.0)
Monocytes Absolute: 0.5 10*3/uL (ref 0.1–1.0)
Monocytes Relative: 10 %
Neutro Abs: 2.9 10*3/uL (ref 1.7–7.7)
Neutrophils Relative %: 55 %
Platelet Count: 315 10*3/uL (ref 150–400)
RBC: 3.3 MIL/uL — ABNORMAL LOW (ref 3.87–5.11)
RDW: 16.2 % — ABNORMAL HIGH (ref 11.5–15.5)
WBC Count: 5.2 10*3/uL (ref 4.0–10.5)
nRBC: 0 % (ref 0.0–0.2)

## 2021-07-29 LAB — TSH: TSH: 1.448 u[IU]/mL (ref 0.308–3.960)

## 2021-07-29 LAB — CMP (CANCER CENTER ONLY)
ALT: 38 U/L (ref 0–44)
AST: 22 U/L (ref 15–41)
Albumin: 3.8 g/dL (ref 3.5–5.0)
Alkaline Phosphatase: 85 U/L (ref 38–126)
Anion gap: 10 (ref 5–15)
BUN: 18 mg/dL (ref 6–20)
CO2: 23 mmol/L (ref 22–32)
Calcium: 9.7 mg/dL (ref 8.9–10.3)
Chloride: 105 mmol/L (ref 98–111)
Creatinine: 0.89 mg/dL (ref 0.44–1.00)
GFR, Estimated: >60 mL/min (ref >60)
Glucose, Bld: 103 mg/dL — ABNORMAL HIGH (ref 70–99)
Potassium: 4.2 mmol/L (ref 3.5–5.1)
Sodium: 138 mmol/L (ref 135–145)
Total Bilirubin: <0.2 mg/dL — ABNORMAL LOW (ref 0.3–1.2)
Total Protein: 6.9 g/dL (ref 6.5–8.1)

## 2021-07-29 MED ORDER — HEPARIN SOD (PORK) LOCK FLUSH 100 UNIT/ML IV SOLN
500.0000 [IU] | Freq: Once | INTRAVENOUS | Status: AC | PRN
  Administered 2021-07-29: 500 [IU]

## 2021-07-29 MED ORDER — SODIUM CHLORIDE 0.9 % IV SOLN: Freq: Once | INTRAVENOUS | Status: AC

## 2021-07-29 MED ORDER — DIPHENHYDRAMINE HCL 50 MG/ML IJ SOLN
25.0000 mg | Freq: Once | INTRAMUSCULAR | Status: AC
  Administered 2021-07-29: 25 mg via INTRAVENOUS
  Filled 2021-07-29: qty 1

## 2021-07-29 MED ORDER — SODIUM CHLORIDE 0.9 % IV SOLN
80.0000 mg/m2 | Freq: Once | INTRAVENOUS | Status: AC
  Administered 2021-07-29: 168 mg via INTRAVENOUS
  Filled 2021-07-29: qty 28

## 2021-07-29 MED ORDER — FAMOTIDINE 20 MG IN NS 100 ML IVPB
20.0000 mg | Freq: Once | INTRAVENOUS | Status: AC
  Administered 2021-07-29: 20 mg via INTRAVENOUS
  Filled 2021-07-29: qty 100

## 2021-07-29 MED ORDER — SODIUM CHLORIDE 0.9% FLUSH
10.0000 mL | INTRAVENOUS | Status: DC | PRN
  Administered 2021-07-29: 10 mL

## 2021-07-29 MED ORDER — SODIUM CHLORIDE 0.9% FLUSH
10.0000 mL | Freq: Once | INTRAVENOUS | Status: AC
  Administered 2021-07-29: 10 mL

## 2021-07-29 MED ORDER — SODIUM CHLORIDE 0.9 % IV SOLN
10.0000 mg | Freq: Once | INTRAVENOUS | Status: AC
  Administered 2021-07-29: 10 mg via INTRAVENOUS
  Filled 2021-07-29: qty 10

## 2021-07-30 ENCOUNTER — Other Ambulatory Visit: Payer: Self-pay

## 2021-07-30 ENCOUNTER — Inpatient Hospital Stay: Payer: BC Managed Care – PPO

## 2021-07-30 VITALS — BP 139/82 | HR 95 | Temp 98.1°F | Resp 18

## 2021-07-30 DIAGNOSIS — Z5112 Encounter for antineoplastic immunotherapy: Secondary | ICD-10-CM | POA: Diagnosis not present

## 2021-07-30 DIAGNOSIS — C50211 Malignant neoplasm of upper-inner quadrant of right female breast: Secondary | ICD-10-CM

## 2021-07-30 LAB — T4: T4, Total: 6.2 ug/dL (ref 4.5–12.0)

## 2021-07-30 MED ORDER — FILGRASTIM-AAFI 480 MCG/0.8ML IJ SOSY
480.0000 ug | PREFILLED_SYRINGE | Freq: Once | INTRAMUSCULAR | Status: AC
  Administered 2021-07-30: 480 ug via SUBCUTANEOUS
  Filled 2021-07-30: qty 0.8

## 2021-07-30 NOTE — Patient Instructions (Signed)
Filgrastim, G-CSF injection What is this medication? FILGRASTIM, G-CSF (fil GRA stim) is a granulocyte colony-stimulating factor that stimulates the growth of neutrophils, a type of white blood cell (WBC) important in the body's fight against infection. It is used to reduce the incidence of fever and infection in patients with certain types of cancer who are receiving chemotherapy that affects the bone marrow, to stimulate blood cell production for removal of WBCs from the body prior to a bone marrow transplantation, to reduce the incidence of fever and infection in patients who have severe chronic neutropenia, and to improve survival outcomes following high-dose radiation exposure that is toxic to the bone marrow. This medicine may be used for other purposes; ask your health care provider or pharmacist if you have questions. COMMON BRAND NAME(S): Neupogen, Nivestym, Releuko, Zarxio What should I tell my care team before I take this medication? They need to know if you have any of these conditions: kidney disease latex allergy ongoing radiation therapy sickle cell disease an unusual or allergic reaction to filgrastim, pegfilgrastim, other medicines, foods, dyes, or preservatives pregnant or trying to get pregnant breast-feeding How should I use this medication? This medicine is for injection under the skin or infusion into a vein. As an infusion into a vein, it is usually given by a health care professional in a hospital or clinic setting. If you get this medicine at home, you will be taught how to prepare and give this medicine. Refer to the Instructions for Use that come with your medication packaging. Use exactly as directed. Take your medicine at regular intervals. Do not take your medicine more often than directed. It is important that you put your used needles and syringes in a special sharps container. Do not put them in a trash can. If you do not have a sharps container, call your pharmacist  or healthcare provider to get one. Talk to your pediatrician regarding the use of this medicine in children. While this drug may be prescribed for children as young as 7 months for selected conditions, precautions do apply. Overdosage: If you think you have taken too much of this medicine contact a poison control center or emergency room at once. NOTE: This medicine is only for you. Do not share this medicine with others. What if I miss a dose? It is important not to miss your dose. Call your doctor or health care professional if you miss a dose. What may interact with this medication? This medicine may interact with the following medications: medicines that may cause a release of neutrophils, such as lithium This list may not describe all possible interactions. Give your health care provider a list of all the medicines, herbs, non-prescription drugs, or dietary supplements you use. Also tell them if you smoke, drink alcohol, or use illegal drugs. Some items may interact with your medicine. What should I watch for while using this medication? Your condition will be monitored carefully while you are receiving this medicine. You may need blood work done while you are taking this medicine. Talk to your health care provider about your risk of cancer. You may be more at risk for certain types of cancer if you take this medicine. What side effects may I notice from receiving this medication? Side effects that you should report to your doctor or health care professional as soon as possible: allergic reactions like skin rash, itching or hives, swelling of the face, lips, or tongue back pain dizziness or feeling faint fever pain, redness, or   irritation at site where injected pinpoint red spots on the skin shortness of breath or breathing problems signs and symptoms of kidney injury like trouble passing urine, change in the amount of urine, or red or dark-brown urine stomach or side pain, or pain at  the shoulder swelling tiredness unusual bleeding or bruising Side effects that usually do not require medical attention (report to your doctor or health care professional if they continue or are bothersome): bone pain cough diarrhea hair loss headache muscle pain This list may not describe all possible side effects. Call your doctor for medical advice about side effects. You may report side effects to FDA at 1-800-FDA-1088. Where should I keep my medication? Keep out of the reach of children. Store in a refrigerator between 2 and 8 degrees C (36 and 46 degrees F). Do not freeze. Keep in carton to protect from light. Throw away this medicine if vials or syringes are left out of the refrigerator for more than 24 hours. Throw away any unused medicine after the expiration date. NOTE: This sheet is a summary. It may not cover all possible information. If you have questions about this medicine, talk to your doctor, pharmacist, or health care provider.  2022 Elsevier/Gold Standard (2019-11-08 18:47:55)  

## 2021-07-31 ENCOUNTER — Inpatient Hospital Stay: Payer: BC Managed Care – PPO

## 2021-07-31 VITALS — BP 124/78 | HR 98 | Temp 98.6°F | Resp 18

## 2021-07-31 DIAGNOSIS — C50211 Malignant neoplasm of upper-inner quadrant of right female breast: Secondary | ICD-10-CM

## 2021-07-31 DIAGNOSIS — Z5112 Encounter for antineoplastic immunotherapy: Secondary | ICD-10-CM | POA: Diagnosis not present

## 2021-07-31 MED ORDER — FILGRASTIM-AAFI 480 MCG/0.8ML IJ SOSY
480.0000 ug | PREFILLED_SYRINGE | Freq: Once | INTRAMUSCULAR | Status: AC
  Administered 2021-07-31: 480 ug via SUBCUTANEOUS
  Filled 2021-07-31: qty 0.8

## 2021-08-01 ENCOUNTER — Other Ambulatory Visit: Payer: Self-pay

## 2021-08-01 ENCOUNTER — Inpatient Hospital Stay: Payer: BC Managed Care – PPO | Attending: Genetic Counselor

## 2021-08-01 VITALS — BP 140/92 | HR 106 | Temp 97.0°F | Resp 18

## 2021-08-01 DIAGNOSIS — Z803 Family history of malignant neoplasm of breast: Secondary | ICD-10-CM | POA: Diagnosis not present

## 2021-08-01 DIAGNOSIS — Z800 Family history of malignant neoplasm of digestive organs: Secondary | ICD-10-CM | POA: Diagnosis not present

## 2021-08-01 DIAGNOSIS — C50211 Malignant neoplasm of upper-inner quadrant of right female breast: Secondary | ICD-10-CM | POA: Diagnosis present

## 2021-08-01 DIAGNOSIS — Z171 Estrogen receptor negative status [ER-]: Secondary | ICD-10-CM | POA: Diagnosis not present

## 2021-08-01 DIAGNOSIS — Z634 Disappearance and death of family member: Secondary | ICD-10-CM | POA: Insufficient documentation

## 2021-08-01 DIAGNOSIS — Z5111 Encounter for antineoplastic chemotherapy: Secondary | ICD-10-CM | POA: Diagnosis present

## 2021-08-01 DIAGNOSIS — Z8042 Family history of malignant neoplasm of prostate: Secondary | ICD-10-CM | POA: Diagnosis not present

## 2021-08-01 DIAGNOSIS — Z7952 Long term (current) use of systemic steroids: Secondary | ICD-10-CM | POA: Diagnosis not present

## 2021-08-01 DIAGNOSIS — Z8249 Family history of ischemic heart disease and other diseases of the circulatory system: Secondary | ICD-10-CM | POA: Diagnosis not present

## 2021-08-01 DIAGNOSIS — Z5112 Encounter for antineoplastic immunotherapy: Secondary | ICD-10-CM | POA: Insufficient documentation

## 2021-08-01 DIAGNOSIS — Z79899 Other long term (current) drug therapy: Secondary | ICD-10-CM | POA: Insufficient documentation

## 2021-08-01 MED ORDER — FILGRASTIM-AAFI 480 MCG/0.8ML IJ SOSY
PREFILLED_SYRINGE | INTRAMUSCULAR | Status: AC
  Administered 2021-08-01: 480 ug
  Filled 2021-08-01: qty 0.8

## 2021-08-01 MED ORDER — FILGRASTIM-AAFI 480 MCG/0.8ML IJ SOSY: 480.0000 ug | PREFILLED_SYRINGE | Freq: Once | INTRAMUSCULAR | Status: DC

## 2021-08-03 NOTE — Progress Notes (Deleted)
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   She began neoadjuvant chemotherapy, consisting of carboplatin paclitaxel pembrolizumab for 4 cycles, on 06/24/2021.  This will be followed by 4 cycles of doxorubicin and cyclophosphamide again with pembrolizumab.       REVIEW OF SYSTEMS: Review of Systems  Constitutional:  Negative for appetite change, chills, fatigue, fever and unexpected weight change.  HENT:   Negative for hearing loss, lump/mass and trouble swallowing.   Eyes:  Negative for eye problems and icterus.  Respiratory:  Negative for chest tightness, cough and shortness of breath.   Cardiovascular:  Negative for chest pain, leg swelling and palpitations.  Gastrointestinal:  Negative for abdominal distention, abdominal pain, constipation, diarrhea, nausea and vomiting.  Endocrine: Negative for hot flashes.  Genitourinary:  Negative for difficulty urinating.   Musculoskeletal:  Negative for arthralgias.  Skin:  Negative for itching and rash.  Neurological:  Negative for dizziness, extremity weakness, headaches and numbness.  Hematological:  Negative for adenopathy. Does not bruise/bleed easily.  Psychiatric/Behavioral:  Negative for depression. The patient is not nervous/anxious.       COVID 19 VACCINATION STATUS:  Status post Pfizer x3 as of August 2022   HISTORY OF CURRENT ILLNESS: From the original intake note:  Rita Davis herself noted a mass in her right breast.  She brought it to medical attention and her mammogram was moved up to 06/03/2021 at the breast center.  This found the breast to be density category C.  On physical exam there was a palpable firm fixed mass in the upper inner quadrant of the right breast.  On the mammogram there was an irregular mass with spiculated borders which on ultrasound measured 2.9 cm at the 1 o'clock position 10 cm from the nipple.  The right axilla was sonographically benign.  Biopsy of the right breast mass in question 06/03/2021 showed an invasive ductal carcinoma, grade 3, estrogen receptor "positive" at 5% with weak staining intensity (functionally estrogen receptor negative), progesterone receptor negative, HER2 equivocal by immunotherapy but negative by FISH, with a proliferation marker of 95%.  Cancer Staging Malignant neoplasm of upper-inner quadrant of right breast in female, estrogen receptor negative ([place]) Staging form: Breast, AJCC 8th Edition - Clinical: Stage IIB (cT2, cN0, cM0, G3, ER-, PR-, HER2-) - Signed by Chauncey Cruel, MD on 06/09/2021  The patient's subsequent history is as detailed below   PAST MEDICAL HISTORY: Past Medical History:  Diagnosis Date   Anemia    had a fibroid tumor, was anemic at that time   Anxiety    Cancer [facility])    breast cancer   Family history of breast cancer    Family history of colon cancer    Family history of prostate cancer    Hyperlipidemia    Hypertension    Pneumonia    as a child   Pre-diabetes     PAST SURGICAL HISTORY: Past  Surgical History:  Procedure Laterality Date   ABDOMINAL HYSTERECTOMY     still has ovaries   BREAST BIOPSY Right 05/23/2012   BREAST CYST EXCISION Right    cyst removed     from lower back   DILATION AND CURETTAGE OF UTERUS      ORIF ANKLE FRACTURE Left 11/13/2020   Procedure: OPEN TREATMENT OF LEFT TRIMALLEOLAR ANKLE FRACTURE WITH POSTERIOR FIXATION, SYNDESMOSIS;  Surgeon: Erle Crocker, MD;  Location: [place];  Service: Orthopedics;  Laterality: Left;  LENGTH OF SURGERY: 1.5 HOURS   PORTACATH PLACEMENT Left 06/18/2021   Procedure: PORT PLACEMENT;  Surgeon: Stark Klein, MD;  Location: [place];  Service: General;  Laterality: Left;    FAMILY HISTORY Family History  Problem Relation Age of Onset   Hypertension Mother    Breast cancer Mother 21       declined treatment   Prostate cancer Father        metastatic, dx 13s   Colon cancer Brother 46   Cancer Maternal Great-grandmother 42       gynecologic cancer (MGM's mother)  The patient's father died at the age of 84, having had breast cancer twice.  The patient has little information on his side of the family.  The patient's mother is 91 years old as of August 2022.  She is currently under hospice care for metastatic breast cancer having refused active treatment (she was evaluated by Dr. Lindi Adie).  Also on the maternal side there is a great grandmother with ovarian cancer.  The patient had one half brother who died from colon cancer at the age of 65.  Another half brother survives.   GYNECOLOGIC HISTORY:  No LMP recorded. Patient has had a hysterectomy. Menarche: 60 years old Age at first live birth: 60 years old [place] P 2 LMP 01/27/2015 Contraceptive several years with no complications HRT no Hysterectomy?  Yes Salpingo-oophorectomy?no   SOCIAL HISTORY:  Rita Davis retired as Psychologist, counselling in 2020-01-27.  Her husband died Mar 26, 2020 from a cardiac arrest in the setting of renal failure.  The patient lives by herself with no pets.  Son Timmothy Sours graduated from [employer] and works for [employer] in [place].  Son Lovena Le graduated from [employer] with a business administration degree and placed in a band    ADVANCED DIRECTIVES: Not in place.  As of  06/09/2021 the patient has not discussed her diagnosis of breast cancer with her children.   HEALTH MAINTENANCE: Social History   Tobacco Use   Smoking status: Never   Smokeless tobacco: Never  Substance Use Topics   Alcohol use: No   Drug use: No     Colonoscopy:  PAP:  Bone density:   No Known Allergies  Current Outpatient Medications  Medication Sig Dispense Refill   amLODipine-olmesartan (AZOR) 5-40 MG tablet Take 1 tablet by mouth in the morning.     betamethasone valerate (VALISONE) 0.1 % cream Apply 1 application topically daily as needed (skin irritation.).     bimatoprost (LATISSE) 0.03 % ophthalmic solution Place one drop on applicator and apply evenly along the skin of the upper eyelid at base of eyelashes once daily at bedtime; repeat procedure for second eye (use a clean applicator).     Calcium-Magnesium-Zinc (CAL-MAG-ZINC PO) Take 1 tablet by mouth every other day. In the morning     Cholecalciferol (VITAMIN D3) 250 MCG (10000 UT) TABS Take 10,000 Units by mouth daily.     dexamethasone (DECADRON) 4 MG  tablet Take 2 tablets once a day for 3 days after carboplatin and AC chemotherapy. Take with food. 30 tablet 1   escitalopram (LEXAPRO) 10 MG tablet Take 10 mg by mouth at bedtime.     lidocaine-prilocaine (EMLA) cream Apply to affected area once 30 g 3   Misc Natural Products (SAMBUCUS ELDERBERRY IMMUNE PO) Take 5 mLs by mouth daily.     Misc Natural Products (SUPER GREENS) POWD Take 1 Scoop by mouth daily.     oxyCODONE (OXY IR/ROXICODONE) 5 MG immediate release tablet Take 1 tablet (5 mg total) by mouth every 6 (six) hours as needed for severe pain. 5 tablet 0   pravastatin (PRAVACHOL) 20 MG tablet Take 20 mg by mouth once a week.     prochlorperazine (COMPAZINE) 10 MG tablet Take 1 tablet (10 mg total) by mouth every 6 (six) hours as needed (Nausea or vomiting). 30 tablet 1   No current facility-administered medications for this visit.    OBJECTIVE:   Vitals:    07/22/21 0848  BP: 133/75  Pulse: 100  Resp: 18  Temp: (!) 97.5 F (36.4 C)  SpO2: 100%      Body mass index is 30.65 kg/m.   Wt Readings from Last 3 Encounters:  07/22/21 201 lb 9.6 oz (91.4 kg)  07/15/21 205 lb 6.4 oz (93.2 kg)  07/08/21 205 lb 6.4 oz (93.2 kg)     ECOG FS:1 - Symptomatic but completely ambulatory GENERAL: Patient is a well appearing female in no acute distress HEENT:  Sclerae anicteric.  Oropharynx clear and moist. No ulcerations or evidence of oropharyngeal candidiasis. Neck is supple.  NODES:  No cervical, supraclavicular, or axillary lymphadenopathy palpated.  BREAST EXAM:  Deferred. LUNGS:  Clear to auscultation bilaterally.  No wheezes or rhonchi. HEART:  Regular rate and rhythm. No murmur appreciated. ABDOMEN:  Soft, nontender.  Positive, normoactive bowel sounds. No organomegaly palpated. MSK:  No focal spinal tenderness to palpation. Full range of motion bilaterally in the upper extremities. EXTREMITIES:  No peripheral edema.   SKIN:  Clear with no obvious rashes or skin changes. No nail dyscrasia. NEURO:  Nonfocal. Well oriented.  Appropriate affect.    LAB RESULTS:  CMP     Component Value Date/Time   NA 137 07/22/2021 0806   K 3.6 07/22/2021 0806   CL 103 07/22/2021 0806   CO2 24 07/22/2021 0806   GLUCOSE 124 (H) 07/22/2021 0806   BUN 20 07/22/2021 0806   CREATININE 1.01 (H) 07/22/2021 0806   CALCIUM 9.4 07/22/2021 0806   PROT 6.8 07/22/2021 0806   ALBUMIN 3.8 07/22/2021 0806   AST 21 07/22/2021 0806   ALT 36 07/22/2021 0806   ALKPHOS 89 07/22/2021 0806   BILITOT 0.4 07/22/2021 0806   GFRNONAA >60 07/22/2021 0806   GFRAA 86 04/18/2008 1652    No results found for: TOTALPROTELP, ALBUMINELP, A1GS, A2GS, BETS, BETA2SER, GAMS, MSPIKE, SPEI  No results found for: Nils Pyle, [facility]  Lab Results  Component Value Date   WBC 6.7 07/22/2021   NEUTROABS 4.7 07/22/2021   HGB 11.0 (L) 07/22/2021   HCT 32.4 (L)  07/22/2021   MCV 88.0 07/22/2021   PLT 192 07/22/2021      Chemistry      Component Value Date/Time   NA 137 07/22/2021 0806   K 3.6 07/22/2021 0806   CL 103 07/22/2021 0806   CO2 24 07/22/2021 0806   BUN 20 07/22/2021 0806   CREATININE 1.01 (H) 07/22/2021 4098  Component Value Date/Time   CALCIUM 9.4 07/22/2021 0806   ALKPHOS 89 07/22/2021 0806   AST 21 07/22/2021 0806   ALT 36 07/22/2021 0806   BILITOT 0.4 07/22/2021 0806       No results found for: LABCA2  No components found for: [id number]  No results for input(s): INR in the last 168 hours.  No results found for: LABCA2  No results found for: NTI144  No results found for: RXV400  No results found for: QQP619  No results found for: CA2729  No components found for: HGQUANT  No results found for: CEA1 / No results found for: CEA1   No results found for: AFPTUMOR  No results found for: CHROMOGRNA  No results found for: TOTALPROTELP, ALBUMINELP, A1GS, A2GS, BETS, BETA2SER, GAMS, MSPIKE, SPEI (this displays SPEP labs)  No results found for: KPAFRELGTCHN, LAMBDASER, KAPLAMBRATIO (kappa/lambda light chains)  No results found for: HGBA, HGBA2QUANT, HGBFQUANT, HGBSQUAN (Hemoglobinopathy evaluation)   No results found for: LDH  No results found for: IRON, TIBC, IRONPCTSAT (Iron and TIBC)  No results found for: FERRITIN  Urinalysis    Component Value Date/Time   COLORURINE YELLOW 11/20/2007 0951   APPEARANCEUR Sl Cloudy 11/20/2007 0951   LABSPEC > OR = 1.030 11/20/2007 0951   PHURINE 5.5 11/20/2007 0951   GLUCOSEU NEGATIVE 11/20/2007 0951   BILIRUBINUR NEGATIVE 11/20/2007 0951   KETONESUR NEGATIVE 11/20/2007 0951   UROBILINOGEN 0.2 mg/dL 11/20/2007 0951   NITRITE Negative 11/20/2007 0951   LEUKOCYTESUR Negative 11/20/2007 0951    STUDIES: MR BREAST RIGHT W WO CONTRAST INC CAD  Result Date: 06/29/2021 CLINICAL DATA:  60 year old female with recently diagnosed right breast cancer presents  for MRI guided biopsy of additional non-mass enhancement seen anterior to the primary malignancy on recent MRI. LABS:  None performed on site. EXAM: MR OF THE RIGHT BREAST WITH AND WITHOUT CONTRAST TECHNIQUE: Multiplanar, multisequence MR images of the right breast were obtained prior to and following the intravenous administration of 9 ml of Gadavist. Three-dimensional MR images were rendered by post-processing of the original MR data on an independent workstation. The three-dimensional MR images were interpreted, and findings are reported in the following complete MRI report for this study. Three dimensional images were evaluated at the independent DynaCad workstation. COMPARISON:  Previous exam(s). FINDINGS: Breast composition: c. Heterogeneous fibroglandular tissue. Background parenchymal enhancement: Minimal. Right breast: Susceptibility artifact from post biopsy marking clip is seen in association with an enhancing mass measuring up to 3.3 cm in the upper outer quadrant posteriorly. No associated non mass enhancement is seen on today's study surrounding the mass or extending from it. Previously demonstrated non-mass enhancement they have been related to post biopsy changes. Ancillary findings:  None. IMPRESSION: Interval resolution of non-mass enhancement extending from the patient's biopsy proven right breast malignancy. The findings may have been related to post biopsy changes. RECOMMENDATION: Per clinical treatment plan. BI-RADS CATEGORY  6: Known biopsy-proven malignancy. Electronically Signed   By: Kristopher Oppenheim M.D.   On: 06/29/2021 10:33    ELIGIBLE FOR AVAILABLE RESEARCH PROTOCOL: no  ASSESSMENT: 59 y.o. Rita Davis woman status post right breast upper inner quadrant biopsy 06/03/2021 for a clinical T2N0 invasive ductal carcinoma, grade 3, functionally triple negative, with an MIB-1 of 95%.  (1) genetics testing  (2) neoadjuvant chemotherapy to consist of carboplatin paclitaxel pembrolizumab  for 4 cycles starting 06/23/2021 followed by doxorubicin and cyclophosphamide pembrolizumab for 4 cycles  (3) definitive surgery to follow  (4) adjuvant radiation as appropriate  PLAN: Rita Davis continues on chemotherapy with Pembrolizumab, Carbo, and Paclitaxel.  Today she is due for Paclitaxel alone.  She continues to tolerate this well.  Her labs remaine adequate for treatment.  Her WBC and ANC is normal today.    We discussed the issue of her tooth.  I reviewed with her that I cannot make any alterations or modifications to her tooth.  I did review that the biggest thing to consider prior to any procedure/treatment is weighing the risks versus the benefits.  I discussed with Rita Davis that the opportune time to undergo dental work is during her week off from chemotherapy, just prior to starting back.  I reviewed that even then at that point, the benefits should outweigh the risks.  I reviewed that regardless, I'd recommend that she take antibiotics while doing under chemotherapy.  She understands this.   I had Rita Davis sign some paperwork so that we can get information from the dentist office about the imaging and plan, so that we can coordinate based on what is best for her with the lowest amount of risk.  I asked my nurse to fax the form over and get more information from the dental office.    She will continue to see [place] on day one of her treatments.  She knows to call for any questions that may arise between now and her next appointment.  We are happy to see her sooner if needed.   Total encounter time 30 minutes.Rita Bihari, NP 07/22/21 9:14 AM Medical Oncology and Hematology [facility] [place], [place] [address] Tel. [phone]    Fax. [phone]    *Total Encounter Time as defined by the Centers for Medicare and Medicaid Services includes, in addition to the face-to-face time of a patient visit (documented in the note above) non-face-to-face time:  obtaining and reviewing outside history, ordering and reviewing medications, tests or procedures, care coordination (communications with other health care professionals or caregivers) and documentation in the medical record.

## 2021-08-04 ENCOUNTER — Inpatient Hospital Stay: Payer: BC Managed Care – PPO | Admitting: Adult Health

## 2021-08-04 ENCOUNTER — Inpatient Hospital Stay: Payer: BC Managed Care – PPO

## 2021-08-04 ENCOUNTER — Telehealth: Payer: Self-pay | Admitting: Adult Health

## 2021-08-04 NOTE — Telephone Encounter (Signed)
Called patient to discuss her missed appointments for today's labs, follow-up, and chemo.  She noted that she never comes on Tuesdays, her appointments are on Wednesdays.  She asked that we call it back once we get it together.    Message sent to nursing and navigation to inquire with scheduling and nursing the availability to work her in today.    Wilber Bihari, NP

## 2021-08-04 NOTE — Progress Notes (Signed)
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   SUMMARY OF ONCOLOGIC HISTORY: Oncology History  Malignant neoplasm of upper-inner quadrant of right breast in female, estrogen receptor negative ([place])  06/09/2021 Initial Diagnosis   Malignant neoplasm of upper-inner quadrant of right breast in female, estrogen receptor negative ([place])   06/09/2021 Cancer Staging   Staging form: Breast, AJCC 8th Edition - Clinical: Stage IIB (cT2, cN0, cM0, G3, ER-, PR-, HER2-) - Signed by Chauncey Cruel, MD on 06/09/2021 Histologic grading system: 3 grade system   06/24/2021 -  Chemotherapy   Patient is on Treatment Plan : BREAST Pembrolizumab + Carboplatin D1 + Paclitaxel D1,8,15 q21d X 4 cycles / Pembrolizumab + AC q21d x 4 cycles     06/30/2021 Genetic Testing   Patient has genetic testing done for personal history of breast cancer. No pathogenic variants were detected. Of note, a variant of uncertain significance was detected in the BRIP1 and TSC2 genes.  The genes analyzed include: APC, ATM, AXIN2, BARD1, BMPR1A, BRCA1, BRCA2, BRIP1, CDH1, CDK4*, CDKN2A (p14ARF)*, CDKN2A (p16INK4a)*, CHEK2, CTNNA1, DICER1, EPCAM (Deletion/duplication testing only), GREM1 (promoter region deletion/duplication testing only), KIT, MEN1, MLH1, MSH2, MSH3, MSH6, MUTYH, NBN, NF1, NHTL1, PALB2, PDGFRA*, PMS2, POLD1, POLE, PTEN, RAD50, RAD51C, RAD51D, SDHB, SDHC, SDHD, SMAD4, SMARCA4. STK11, TP53, TSC1, TSC2, and VHL.  The following genes were evaluated for sequence changes only: SDHA and HOXB13 c.251G>A  variant only.      CURRENT THERAPY: neoadjuvant chemo weekly taxol days 1, 8, 15 q. 21 days with [place] and [place] on day 1 of every cycle.  INTERVAL HISTORY: Ms. Speirs returns for follow up and treatment as scheduled. She cycle 2 carbo/Taxol/Pember on day 1, Taxol on days 8 and 15 with nivestym on 9/29 and 9/30. She is here prior to cycle 3.  She tolerates treatment very well, energy and appetite are more than adequate, she continues to walk 2 miles a day.  Bowels moving normally.  No nausea or vomiting. She had 1 episode of tingling to the second toe on the left foot last week but that resolved. Denies rash, fever, chills, cough, chest pain, dyspnea, neuropathy.  The right breast mass is not palpable.   MEDICAL HISTORY:  Past Medical History:  Diagnosis Date   Anemia    had a fibroid tumor, was anemic at that time   Anxiety    Cancer [facility])    breast cancer   Family history of breast cancer    Family history of colon cancer    Family history of prostate cancer    Hyperlipidemia    Hypertension    Pneumonia    as a child   Pre-diabetes     SURGICAL HISTORY: Past Surgical History:  Procedure Laterality Date   ABDOMINAL HYSTERECTOMY     still has ovaries   BREAST BIOPSY Right 05/23/2012   BREAST CYST EXCISION Right    cyst removed     from lower back   DILATION AND CURETTAGE OF UTERUS     ORIF ANKLE FRACTURE Left 11/13/2020   Procedure: OPEN TREATMENT OF LEFT TRIMALLEOLAR ANKLE FRACTURE WITH  POSTERIOR FIXATION, SYNDESMOSIS;  Surgeon: Erle Crocker, MD;  Location: [facility];  Service: Orthopedics;  Laterality: Left;  LENGTH OF SURGERY: 1.5 HOURS   PORTACATH PLACEMENT Left 06/18/2021   Procedure: PORT PLACEMENT;  Surgeon: Stark Klein, MD;  Location: [facility];  Service: General;  Laterality: Left;    I have reviewed the social history and family history with the patient and they are unchanged from previous note.  ALLERGIES:  has No Known  Allergies.  MEDICATIONS:  Current Outpatient Medications  Medication Sig Dispense Refill   amLODipine-olmesartan (AZOR) 5-40 MG tablet Take 1 tablet by mouth in the morning.     betamethasone valerate (VALISONE) 0.1 % cream Apply 1 application topically daily as needed (skin irritation.).     bimatoprost (LATISSE) 0.03 % ophthalmic solution Place one drop on applicator and apply evenly along the skin of the upper eyelid at base of eyelashes once daily at bedtime; repeat procedure for second eye (use a clean applicator).     Calcium-Magnesium-Zinc (CAL-MAG-ZINC PO) Take 1 tablet by mouth every other day. In the morning     Cholecalciferol (VITAMIN D3) 250 MCG (10000 UT) TABS Take 10,000 Units by mouth daily.     dexamethasone (DECADRON) 4 MG tablet Take 2 tablets once a day for 3 days after carboplatin and AC chemotherapy. Take with food. 30 tablet 1   escitalopram (LEXAPRO) 10 MG tablet Take 10 mg by mouth at bedtime.     lidocaine-prilocaine (EMLA) cream Apply to affected area once 30 g 3   Misc Natural Products (SAMBUCUS ELDERBERRY IMMUNE PO) Take 5 mLs by mouth daily.     Misc Natural Products (SUPER GREENS) POWD Take 1 Scoop by mouth daily.     pravastatin (PRAVACHOL) 20 MG tablet Take 20 mg by mouth once a week.     prochlorperazine (COMPAZINE) 10 MG tablet Take 1 tablet (10 mg total) by mouth every 6 (six) hours as needed (Nausea or vomiting). 30 tablet 1   No current facility-administered medications for this visit.   Facility-Administered Medications Ordered in Other Visits  Medication Dose Route Frequency Provider Last Rate Last Admin   CARBOplatin (PARAPLATIN) 540 mg in sodium chloride 0.9 % 250 mL chemo infusion  540 mg Intravenous Once Magrinat, Virgie Dad, MD       dexamethasone (DECADRON) 10 mg in sodium chloride 0.9 % 50 mL IVPB  10 mg Intravenous Once Magrinat, Virgie Dad, MD       famotidine (PEPCID) IVPB 20 mg in NS 100 mL IVPB  20 mg Intravenous Once Magrinat, Virgie Dad, MD 400  mL/hr at 08/05/21 1237 20 mg at 08/05/21 1237   fosaprepitant (EMEND) 150 mg in sodium chloride 0.9 % 145 mL IVPB  150 mg Intravenous Once Magrinat, Virgie Dad, MD       heparin lock flush 100 unit/mL  500 Units Intracatheter Once PRN Magrinat, Virgie Dad, MD       PACLitaxel (TAXOL) 168 mg in sodium chloride 0.9 % 250 mL chemo infusion (</= 60m/m2)  80 mg/m2 (Treatment Plan Recorded) Intravenous Once Magrinat, GVirgie Dad MD       pembrolizumab ([facility] 200 mg in sodium chloride 0.9 % 50 mL chemo infusion  200 mg Intravenous Once Magrinat, GVirgie Dad MD       sodium chloride flush (NS) 0.9 % injection 10 mL  10 mL Intracatheter PRN Magrinat, GVirgie Dad MD        PHYSICAL EXAMINATION: ECOG PERFORMANCE STATUS: 0 - Asymptomatic  Vitals:   08/05/21 1136  BP: 134/85  Pulse: 96  Resp: 18  Temp: 98.4 F (36.9 C)  SpO2: 98%   Filed Weights   08/05/21 1136  Weight: 209 lb 11.2 oz (95.1 kg)    GENERAL:alert, no distress and comfortable SKIN: Few small skin eruptions to the left upper chest near [facility].  No rash EYES: sclera clear NECK: Without mass LYMPH:  no palpable cervical or supraclavicular lymphadenopathy LUNGS: clear with normal breathing effort HEART: regular rate & rhythm, no lower extremity edema NEURO: alert & oriented x 3 with fluent speech, no focal motor deficits PAC without erythema Breast exam: Symmetrical without nipple discharge or inversion.  No palpable mass in the right breast or either axilla that I could appreciate  LABORATORY DATA:  I have reviewed the data as listed CBC Latest Ref Rng & Units 08/05/2021 07/29/2021 07/22/2021  WBC 4.0 - 10.5 K/uL 15.7(H) 5.2 6.7  Hemoglobin 12.0 - 15.0 g/dL 9.3(L) 10.2(L) 11.0(L)  Hematocrit 36.0 - 46.0 % 27.2(L) 29.5(L) 32.4(L)  Platelets 150 - 400 K/uL 264 315 192     CMP Latest Ref Rng & Units 08/05/2021 07/29/2021 07/22/2021  Glucose 70 - 99 mg/dL 84 103(H) 124(H)  BUN 6 - 20 mg/dL _0 Creatinine 0.44 - 1.00 mg/dL 1.05(H)  0.89 1.01(H)  Sodium 135 - 145 mmol/L 141 138 137  Potassium 3.5 - 5.1 mmol/L 4.2 4.2 3.6  Chloride 98 - 111 mmol/L 108 105 103  CO2 22 - 32 mmol/L _1 Calcium 8.9 - 10.3 mg/dL 9.1 9.7 9.4  Total Protein 6.5 - 8.1 g/dL 6.4(L) 6.9 6.8  Total Bilirubin 0.3 - 1.2 mg/dL <0.2(L) <0.2(L) 0.4  Alkaline Phos 38 - 126 U/L 99 85 89  AST 15 - 41 U/L _2 ALT 0 - 44 U/L 34 38 36      RADIOGRAPHIC STUDIES: I have personally reviewed the radiological images as listed and agreed with the findings in the report. No results found.   ASSESSMENT & PLAN: 60 year old female  1.  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  Began neoadjuvant chemo 06/23/2021 with weekly Taxol days 1, 8, 15 q. 21 days with carboplatin on day 1 of each cycle and Pembro every 3 weeks.  And filgrastim after each cycle -The treatment plan includes AC x4 cycles to follow carbo/Taxol, continue Pembro q3 weeks, definitive surgery, and adjuvant radiation  Disposition: Rita Davis appears stable.  She completed 2 cycles of weekly Taxol on days 1, 8, 15 q. 21 days and carbo and Pembro on day 1 of each cycle.  She tolerates treatment very well with minimal side effects, no significant neuropathy.  She is able to recover and function well with good performance status.  There is no palpable mass in the right breast, consistent with a clinical response to neoadjuvant treatment.  Abs reviewed, adequate to proceed with cycle 3-day 1 Taxol/carbo/pembrolizumab today as planned.  Recommend cryotherapy during Taxol.  She will return for Taxol and 1 in 2 weeks then filgrastim as scheduled.  Follow-up with Dr. Jana Hakim and cycle 4 day 1 in 3 weeks.  All questions were answered. The patient knows to call the clinic with any problems, questions or concerns. No barriers to learning were detected.     Alla Feeling, NP 08/05/21

## 2021-08-05 ENCOUNTER — Inpatient Hospital Stay: Payer: BC Managed Care – PPO

## 2021-08-05 ENCOUNTER — Encounter: Payer: Self-pay | Admitting: Nurse Practitioner

## 2021-08-05 ENCOUNTER — Inpatient Hospital Stay (HOSPITAL_BASED_OUTPATIENT_CLINIC_OR_DEPARTMENT_OTHER): Payer: BC Managed Care – PPO | Admitting: Nurse Practitioner

## 2021-08-05 ENCOUNTER — Other Ambulatory Visit: Payer: Self-pay

## 2021-08-05 VITALS — BP 134/85 | HR 96 | Temp 98.4°F | Resp 18 | Ht 68.0 in | Wt 209.7 lb

## 2021-08-05 DIAGNOSIS — C50211 Malignant neoplasm of upper-inner quadrant of right female breast: Secondary | ICD-10-CM | POA: Diagnosis not present

## 2021-08-05 DIAGNOSIS — Z95828 Presence of other vascular implants and grafts: Secondary | ICD-10-CM

## 2021-08-05 DIAGNOSIS — Z171 Estrogen receptor negative status [ER-]: Secondary | ICD-10-CM | POA: Diagnosis not present

## 2021-08-05 DIAGNOSIS — Z5112 Encounter for antineoplastic immunotherapy: Secondary | ICD-10-CM | POA: Diagnosis not present

## 2021-08-05 LAB — CMP (CANCER CENTER ONLY)
ALT: 34 U/L (ref 0–44)
AST: 27 U/L (ref 15–41)
Albumin: 3.5 g/dL (ref 3.5–5.0)
Alkaline Phosphatase: 99 U/L (ref 38–126)
Anion gap: 8 (ref 5–15)
BUN: 17 mg/dL (ref 6–20)
CO2: 25 mmol/L (ref 22–32)
Calcium: 9.1 mg/dL (ref 8.9–10.3)
Chloride: 108 mmol/L (ref 98–111)
Creatinine: 1.05 mg/dL — ABNORMAL HIGH (ref 0.44–1.00)
GFR, Estimated: >60 mL/min (ref >60)
Glucose, Bld: 84 mg/dL (ref 70–99)
Potassium: 4.2 mmol/L (ref 3.5–5.1)
Sodium: 141 mmol/L (ref 135–145)
Total Bilirubin: <0.2 mg/dL — ABNORMAL LOW (ref 0.3–1.2)
Total Protein: 6.4 g/dL — ABNORMAL LOW (ref 6.5–8.1)

## 2021-08-05 LAB — CBC WITH DIFFERENTIAL (CANCER CENTER ONLY)
Abs Immature Granulocytes: 5.1 10*3/uL — ABNORMAL HIGH (ref 0.00–0.07)
Basophils Absolute: 0 10*3/uL (ref 0.0–0.1)
Basophils Relative: 0 %
Eosinophils Absolute: 0.1 10*3/uL (ref 0.0–0.5)
Eosinophils Relative: 0 %
HCT: 27.2 % — ABNORMAL LOW (ref 36.0–46.0)
Hemoglobin: 9.3 g/dL — ABNORMAL LOW (ref 12.0–15.0)
Immature Granulocytes: 33 %
Lymphocytes Relative: 16 %
Lymphs Abs: 2.4 10*3/uL (ref 0.7–4.0)
MCH: 31.1 pg (ref 26.0–34.0)
MCHC: 34.2 g/dL (ref 30.0–36.0)
MCV: 91 fL (ref 80.0–100.0)
Monocytes Absolute: 1.4 10*3/uL — ABNORMAL HIGH (ref 0.1–1.0)
Monocytes Relative: 9 %
Neutro Abs: 6.7 10*3/uL (ref 1.7–7.7)
Neutrophils Relative %: 42 %
Platelet Count: 264 10*3/uL (ref 150–400)
RBC: 2.99 MIL/uL — ABNORMAL LOW (ref 3.87–5.11)
RDW: 18.1 % — ABNORMAL HIGH (ref 11.5–15.5)
WBC Count: 15.7 10*3/uL — ABNORMAL HIGH (ref 4.0–10.5)
nRBC: 2.9 % — ABNORMAL HIGH (ref 0.0–0.2)

## 2021-08-05 LAB — TSH: TSH: 0.694 u[IU]/mL (ref 0.308–3.960)

## 2021-08-05 MED ORDER — PALONOSETRON HCL INJECTION 0.25 MG/5ML
0.2500 mg | Freq: Once | INTRAVENOUS | Status: AC
  Administered 2021-08-05: 0.25 mg via INTRAVENOUS
  Filled 2021-08-05: qty 5

## 2021-08-05 MED ORDER — HEPARIN SOD (PORK) LOCK FLUSH 100 UNIT/ML IV SOLN
500.0000 [IU] | Freq: Once | INTRAVENOUS | Status: AC | PRN
  Administered 2021-08-05: 500 [IU]

## 2021-08-05 MED ORDER — SODIUM CHLORIDE 0.9% FLUSH
10.0000 mL | Freq: Once | INTRAVENOUS | Status: AC
  Administered 2021-08-05: 10 mL

## 2021-08-05 MED ORDER — SODIUM CHLORIDE 0.9 % IV SOLN
80.0000 mg/m2 | Freq: Once | INTRAVENOUS | Status: AC
  Administered 2021-08-05: 168 mg via INTRAVENOUS
  Filled 2021-08-05: qty 28

## 2021-08-05 MED ORDER — SODIUM CHLORIDE 0.9 % IV SOLN
200.0000 mg | Freq: Once | INTRAVENOUS | Status: AC
Start: 2021-08-05 — End: 2021-08-05
  Administered 2021-08-05: 200 mg via INTRAVENOUS
  Filled 2021-08-05: qty 8

## 2021-08-05 MED ORDER — SODIUM CHLORIDE 0.9 % IV SOLN
150.0000 mg | Freq: Once | INTRAVENOUS | Status: AC
  Administered 2021-08-05: 150 mg via INTRAVENOUS
  Filled 2021-08-05: qty 150

## 2021-08-05 MED ORDER — SODIUM CHLORIDE 0.9% FLUSH
10.0000 mL | INTRAVENOUS | Status: DC | PRN
  Administered 2021-08-05: 10 mL

## 2021-08-05 MED ORDER — DIPHENHYDRAMINE HCL 50 MG/ML IJ SOLN
25.0000 mg | Freq: Once | INTRAMUSCULAR | Status: AC
  Administered 2021-08-05: 25 mg via INTRAVENOUS
  Filled 2021-08-05: qty 1

## 2021-08-05 MED ORDER — FAMOTIDINE 20 MG IN NS 100 ML IVPB
20.0000 mg | Freq: Once | INTRAVENOUS | Status: AC
  Administered 2021-08-05: 20 mg via INTRAVENOUS
  Filled 2021-08-05: qty 100

## 2021-08-05 MED ORDER — SODIUM CHLORIDE 0.9 % IV SOLN
536.0000 mg | Freq: Once | INTRAVENOUS | Status: AC
  Administered 2021-08-05: 540 mg via INTRAVENOUS
  Filled 2021-08-05: qty 54

## 2021-08-05 MED ORDER — SODIUM CHLORIDE 0.9 % IV SOLN
10.0000 mg | Freq: Once | INTRAVENOUS | Status: AC
  Administered 2021-08-05: 10 mg via INTRAVENOUS
  Filled 2021-08-05: qty 10

## 2021-08-05 MED ORDER — SODIUM CHLORIDE 0.9 % IV SOLN: Freq: Once | INTRAVENOUS | Status: AC

## 2021-08-05 NOTE — Patient Instructions (Signed)
[facility]  Discharge Instructions: Thank you for choosing [facility] to provide your oncology and hematology care.   If you have a lab appointment with the Cancer Center, please go directly to the Cancer Center and check in at the registration area.   Wear comfortable clothing and clothing appropriate for easy access to any Portacath or PICC line.   We strive to give you quality time with your provider. You may need to reschedule your appointment if you arrive late (15 or more minutes).  Arriving late affects you and other patients whose appointments are after yours.  Also, if you miss three or more appointments without notifying the office, you may be dismissed from the clinic at the provider's discretion.      For prescription refill requests, have your pharmacy contact our office and allow 72 hours for refills to be completed.    Today you received the following chemotherapy and/or immunotherapy agents: Keytruda, Taxol, Carboplatin     To help prevent nausea and vomiting after your treatment, we encourage you to take your nausea medication as directed.  BELOW ARE SYMPTOMS THAT SHOULD BE REPORTED IMMEDIATELY: . *FEVER GREATER THAN 100.4 F (38 C) OR HIGHER . *CHILLS OR SWEATING . *NAUSEA AND VOMITING THAT IS NOT CONTROLLED WITH YOUR NAUSEA MEDICATION . *UNUSUAL SHORTNESS OF BREATH . *UNUSUAL BRUISING OR BLEEDING . *URINARY PROBLEMS (pain or burning when urinating, or frequent urination) . *BOWEL PROBLEMS (unusual diarrhea, constipation, pain near the anus) . TENDERNESS IN MOUTH AND THROAT WITH OR WITHOUT PRESENCE OF ULCERS (sore throat, sores in mouth, or a toothache) . UNUSUAL RASH, SWELLING OR PAIN  . UNUSUAL VAGINAL DISCHARGE OR ITCHING   Items with * indicate a potential emergency and should be followed up as soon as possible or go to the Emergency Department if any problems should occur.  Please show the CHEMOTHERAPY ALERT CARD or  IMMUNOTHERAPY ALERT CARD at check-in to the Emergency Department and triage nurse.  Should you have questions after your visit or need to cancel or reschedule your appointment, please contact [facility]  Dept: [phone]  and follow the prompts.  Office hours are 8:00 a.m. to 4:30 p.m. Monday - Friday. Please note that voicemails left after 4:00 p.m. may not be returned until the following business day.  We are closed weekends and major holidays. You have access to a nurse at all times for urgent questions. Please call the main number to the clinic Dept: [phone] and follow the prompts.   For any non-urgent questions, you may also contact your provider using MyChart. We now offer e-Visits for anyone 18 and older to request care online for non-urgent symptoms. For details visit [email or web address].   Also download the MyChart app! Go to the app store, search "MyChart", open the app, select [facility], and log in with your MyChart username and password.  Due to Covid, a mask is required upon entering the hospital/clinic. If you do not have a mask, one will be given to you upon arrival. For doctor visits, patients may have 1 support person aged 18 or older with them. For treatment visits, patients cannot have anyone with them due to current Covid guidelines and our immunocompromised population.   

## 2021-08-06 ENCOUNTER — Telehealth: Payer: Self-pay | Admitting: Nurse Practitioner

## 2021-08-06 LAB — T4: T4, Total: 5.8 ug/dL (ref 4.5–12.0)

## 2021-08-06 NOTE — Telephone Encounter (Signed)
Scheduled follow-up appointments per 10/5 los. Patient is aware. 

## 2021-08-11 MED FILL — Dexamethasone Sodium Phosphate Inj 100 MG/10ML: INTRAMUSCULAR | Qty: 1 | Status: AC

## 2021-08-12 ENCOUNTER — Inpatient Hospital Stay: Payer: BC Managed Care – PPO

## 2021-08-12 ENCOUNTER — Other Ambulatory Visit: Payer: Self-pay

## 2021-08-12 VITALS — BP 137/80 | HR 96 | Temp 98.3°F | Resp 16 | Wt 204.8 lb

## 2021-08-12 DIAGNOSIS — C50211 Malignant neoplasm of upper-inner quadrant of right female breast: Secondary | ICD-10-CM

## 2021-08-12 DIAGNOSIS — Z5112 Encounter for antineoplastic immunotherapy: Secondary | ICD-10-CM | POA: Diagnosis not present

## 2021-08-12 DIAGNOSIS — Z95828 Presence of other vascular implants and grafts: Secondary | ICD-10-CM

## 2021-08-12 LAB — CBC WITH DIFFERENTIAL (CANCER CENTER ONLY)
Abs Immature Granulocytes: 0.09 10*3/uL — ABNORMAL HIGH (ref 0.00–0.07)
Basophils Absolute: 0 10*3/uL (ref 0.0–0.1)
Basophils Relative: 0 %
Eosinophils Absolute: 0 10*3/uL (ref 0.0–0.5)
Eosinophils Relative: 1 %
HCT: 30.2 % — ABNORMAL LOW (ref 36.0–46.0)
Hemoglobin: 10.3 g/dL — ABNORMAL LOW (ref 12.0–15.0)
Immature Granulocytes: 1 %
Lymphocytes Relative: 18 %
Lymphs Abs: 1.5 10*3/uL (ref 0.7–4.0)
MCH: 30.7 pg (ref 26.0–34.0)
MCHC: 34.1 g/dL (ref 30.0–36.0)
MCV: 90.1 fL (ref 80.0–100.0)
Monocytes Absolute: 0.2 10*3/uL (ref 0.1–1.0)
Monocytes Relative: 3 %
Neutro Abs: 6.3 10*3/uL (ref 1.7–7.7)
Neutrophils Relative %: 77 %
Platelet Count: 205 10*3/uL (ref 150–400)
RBC: 3.35 MIL/uL — ABNORMAL LOW (ref 3.87–5.11)
RDW: 17.2 % — ABNORMAL HIGH (ref 11.5–15.5)
WBC Count: 8.1 10*3/uL (ref 4.0–10.5)
nRBC: 0.2 % (ref 0.0–0.2)

## 2021-08-12 LAB — CMP (CANCER CENTER ONLY)
ALT: 30 U/L (ref 0–44)
AST: 21 U/L (ref 15–41)
Albumin: 3.7 g/dL (ref 3.5–5.0)
Alkaline Phosphatase: 71 U/L (ref 38–126)
Anion gap: 5 (ref 5–15)
BUN: 20 mg/dL (ref 6–20)
CO2: 28 mmol/L (ref 22–32)
Calcium: 9.5 mg/dL (ref 8.9–10.3)
Chloride: 107 mmol/L (ref 98–111)
Creatinine: 0.9 mg/dL (ref 0.44–1.00)
GFR, Estimated: >60 mL/min (ref >60)
Glucose, Bld: 122 mg/dL — ABNORMAL HIGH (ref 70–99)
Potassium: 4 mmol/L (ref 3.5–5.1)
Sodium: 140 mmol/L (ref 135–145)
Total Bilirubin: 0.4 mg/dL (ref 0.3–1.2)
Total Protein: 6.6 g/dL (ref 6.5–8.1)

## 2021-08-12 LAB — TSH: TSH: 2.341 u[IU]/mL (ref 0.350–4.500)

## 2021-08-12 MED ORDER — FAMOTIDINE 20 MG IN NS 100 ML IVPB
20.0000 mg | Freq: Once | INTRAVENOUS | Status: AC
  Administered 2021-08-12: 20 mg via INTRAVENOUS
  Filled 2021-08-12: qty 100

## 2021-08-12 MED ORDER — SODIUM CHLORIDE 0.9 % IV SOLN: Freq: Once | INTRAVENOUS | Status: AC

## 2021-08-12 MED ORDER — DIPHENHYDRAMINE HCL 50 MG/ML IJ SOLN
25.0000 mg | Freq: Once | INTRAMUSCULAR | Status: AC
  Administered 2021-08-12: 25 mg via INTRAVENOUS
  Filled 2021-08-12: qty 1

## 2021-08-12 MED ORDER — HEPARIN SOD (PORK) LOCK FLUSH 100 UNIT/ML IV SOLN
500.0000 [IU] | Freq: Once | INTRAVENOUS | Status: AC | PRN
  Administered 2021-08-12: 500 [IU]

## 2021-08-12 MED ORDER — SODIUM CHLORIDE 0.9% FLUSH
10.0000 mL | INTRAVENOUS | Status: DC | PRN
  Administered 2021-08-12: 10 mL

## 2021-08-12 MED ORDER — SODIUM CHLORIDE 0.9% FLUSH
10.0000 mL | Freq: Once | INTRAVENOUS | Status: AC
Start: 2021-08-12 — End: 2021-08-12
  Administered 2021-08-12: 10 mL

## 2021-08-12 MED ORDER — SODIUM CHLORIDE 0.9 % IV SOLN
80.0000 mg/m2 | Freq: Once | INTRAVENOUS | Status: AC
  Administered 2021-08-12: 168 mg via INTRAVENOUS
  Filled 2021-08-12: qty 28

## 2021-08-12 MED ORDER — SODIUM CHLORIDE 0.9 % IV SOLN
10.0000 mg | Freq: Once | INTRAVENOUS | Status: AC
  Administered 2021-08-12: 10 mg via INTRAVENOUS
  Filled 2021-08-12: qty 10

## 2021-08-12 NOTE — Patient Instructions (Signed)
[facility]   Discharge Instructions: Thank you for choosing [facility] to provide your oncology and hematology care.   If you have a lab appointment with the Cancer Center, please go directly to the Cancer Center and check in at the registration area.   Wear comfortable clothing and clothing appropriate for easy access to any Portacath or PICC line.   We strive to give you quality time with your provider. You may need to reschedule your appointment if you arrive late (15 or more minutes).  Arriving late affects you and other patients whose appointments are after yours.  Also, if you miss three or more appointments without notifying the office, you may be dismissed from the clinic at the provider's discretion.      For prescription refill requests, have your pharmacy contact our office and allow 72 hours for refills to be completed.    Today you received the following chemotherapy and/or immunotherapy agents: paclitaxel.      To help prevent nausea and vomiting after your treatment, we encourage you to take your nausea medication as directed.  BELOW ARE SYMPTOMS THAT SHOULD BE REPORTED IMMEDIATELY: *FEVER GREATER THAN 100.4 F (38 C) OR HIGHER *CHILLS OR SWEATING *NAUSEA AND VOMITING THAT IS NOT CONTROLLED WITH YOUR NAUSEA MEDICATION *UNUSUAL SHORTNESS OF BREATH *UNUSUAL BRUISING OR BLEEDING *URINARY PROBLEMS (pain or burning when urinating, or frequent urination) *BOWEL PROBLEMS (unusual diarrhea, constipation, pain near the anus) TENDERNESS IN MOUTH AND THROAT WITH OR WITHOUT PRESENCE OF ULCERS (sore throat, sores in mouth, or a toothache) UNUSUAL RASH, SWELLING OR PAIN  UNUSUAL VAGINAL DISCHARGE OR ITCHING   Items with * indicate a potential emergency and should be followed up as soon as possible or go to the Emergency Department if any problems should occur.  Please show the CHEMOTHERAPY ALERT CARD or IMMUNOTHERAPY ALERT CARD at check-in  to the Emergency Department and triage nurse.  Should you have questions after your visit or need to cancel or reschedule your appointment, please contact [facility]  Dept: [phone]  and follow the prompts.  Office hours are 8:00 a.m. to 4:30 p.m. Monday - Friday. Please note that voicemails left after 4:00 p.m. may not be returned until the following business day.  We are closed weekends and major holidays. You have access to a nurse at all times for urgent questions. Please call the main number to the clinic Dept: [phone] and follow the prompts.   For any non-urgent questions, you may also contact your provider using MyChart. We now offer e-Visits for anyone 18 and older to request care online for non-urgent symptoms. For details visit [email or web address].   Also download the MyChart app! Go to the app store, search "MyChart", open the app, select [facility], and log in with your MyChart username and password.  Due to Covid, a mask is required upon entering the hospital/clinic. If you do not have a mask, one will be given to you upon arrival. For doctor visits, patients may have 1 support person aged 18 or older with them. For treatment visits, patients cannot have anyone with them due to current Covid guidelines and our immunocompromised population.   

## 2021-08-13 LAB — T4: T4, Total: 7.2 ug/dL (ref 4.5–12.0)

## 2021-08-18 MED FILL — Dexamethasone Sodium Phosphate Inj 100 MG/10ML: INTRAMUSCULAR | Qty: 1 | Status: AC

## 2021-08-19 ENCOUNTER — Inpatient Hospital Stay: Payer: BC Managed Care – PPO

## 2021-08-19 ENCOUNTER — Other Ambulatory Visit: Payer: Self-pay

## 2021-08-19 VITALS — BP 137/88 | HR 95 | Temp 97.8°F | Resp 18 | Wt 201.8 lb

## 2021-08-19 DIAGNOSIS — C50211 Malignant neoplasm of upper-inner quadrant of right female breast: Secondary | ICD-10-CM

## 2021-08-19 DIAGNOSIS — Z5112 Encounter for antineoplastic immunotherapy: Secondary | ICD-10-CM | POA: Diagnosis not present

## 2021-08-19 DIAGNOSIS — Z95828 Presence of other vascular implants and grafts: Secondary | ICD-10-CM

## 2021-08-19 DIAGNOSIS — Z171 Estrogen receptor negative status [ER-]: Secondary | ICD-10-CM

## 2021-08-19 LAB — CMP (CANCER CENTER ONLY)
ALT: 31 U/L (ref 0–44)
AST: 20 U/L (ref 15–41)
Albumin: 3.7 g/dL (ref 3.5–5.0)
Alkaline Phosphatase: 70 U/L (ref 38–126)
Anion gap: 11 (ref 5–15)
BUN: 12 mg/dL (ref 6–20)
CO2: 23 mmol/L (ref 22–32)
Calcium: 9.4 mg/dL (ref 8.9–10.3)
Chloride: 106 mmol/L (ref 98–111)
Creatinine: 0.95 mg/dL (ref 0.44–1.00)
GFR, Estimated: >60 mL/min (ref >60)
Glucose, Bld: 110 mg/dL — ABNORMAL HIGH (ref 70–99)
Potassium: 3.8 mmol/L (ref 3.5–5.1)
Sodium: 140 mmol/L (ref 135–145)
Total Bilirubin: 0.2 mg/dL — ABNORMAL LOW (ref 0.3–1.2)
Total Protein: 6.8 g/dL (ref 6.5–8.1)

## 2021-08-19 LAB — CBC WITH DIFFERENTIAL (CANCER CENTER ONLY)
Abs Immature Granulocytes: 0.05 10*3/uL (ref 0.00–0.07)
Basophils Absolute: 0 10*3/uL (ref 0.0–0.1)
Basophils Relative: 0 %
Eosinophils Absolute: 0 10*3/uL (ref 0.0–0.5)
Eosinophils Relative: 1 %
HCT: 28.2 % — ABNORMAL LOW (ref 36.0–46.0)
Hemoglobin: 9.5 g/dL — ABNORMAL LOW (ref 12.0–15.0)
Immature Granulocytes: 1 %
Lymphocytes Relative: 25 %
Lymphs Abs: 1.2 10*3/uL (ref 0.7–4.0)
MCH: 30.9 pg (ref 26.0–34.0)
MCHC: 33.7 g/dL (ref 30.0–36.0)
MCV: 91.9 fL (ref 80.0–100.0)
Monocytes Absolute: 0.4 10*3/uL (ref 0.1–1.0)
Monocytes Relative: 7 %
Neutro Abs: 3.2 10*3/uL (ref 1.7–7.7)
Neutrophils Relative %: 66 %
Platelet Count: 290 10*3/uL (ref 150–400)
RBC: 3.07 MIL/uL — ABNORMAL LOW (ref 3.87–5.11)
RDW: 18.6 % — ABNORMAL HIGH (ref 11.5–15.5)
WBC Count: 4.9 10*3/uL (ref 4.0–10.5)
nRBC: 0.4 % — ABNORMAL HIGH (ref 0.0–0.2)

## 2021-08-19 LAB — TSH: TSH: 1.709 u[IU]/mL (ref 0.308–3.960)

## 2021-08-19 MED ORDER — DIPHENHYDRAMINE HCL 50 MG/ML IJ SOLN
25.0000 mg | Freq: Once | INTRAMUSCULAR | Status: AC
  Administered 2021-08-19: 25 mg via INTRAVENOUS
  Filled 2021-08-19: qty 1

## 2021-08-19 MED ORDER — SODIUM CHLORIDE 0.9% FLUSH
10.0000 mL | Freq: Once | INTRAVENOUS | Status: AC
Start: 2021-08-19 — End: 2021-08-19
  Administered 2021-08-19: 10 mL

## 2021-08-19 MED ORDER — HEPARIN SOD (PORK) LOCK FLUSH 100 UNIT/ML IV SOLN
500.0000 [IU] | Freq: Once | INTRAVENOUS | Status: AC | PRN
Start: 2021-08-19 — End: 2021-08-19
  Administered 2021-08-19: 500 [IU]

## 2021-08-19 MED ORDER — SODIUM CHLORIDE 0.9 % IV SOLN
10.0000 mg | Freq: Once | INTRAVENOUS | Status: AC
  Administered 2021-08-19: 10 mg via INTRAVENOUS
  Filled 2021-08-19: qty 10

## 2021-08-19 MED ORDER — SODIUM CHLORIDE 0.9 % IV SOLN
80.0000 mg/m2 | Freq: Once | INTRAVENOUS | Status: AC
  Administered 2021-08-19: 168 mg via INTRAVENOUS
  Filled 2021-08-19: qty 28

## 2021-08-19 MED ORDER — SODIUM CHLORIDE 0.9 % IV SOLN: Freq: Once | INTRAVENOUS | Status: AC

## 2021-08-19 MED ORDER — SODIUM CHLORIDE 0.9% FLUSH
10.0000 mL | INTRAVENOUS | Status: DC | PRN
  Administered 2021-08-19: 10 mL

## 2021-08-19 MED ORDER — FAMOTIDINE 20 MG IN NS 100 ML IVPB
20.0000 mg | Freq: Once | INTRAVENOUS | Status: AC
  Administered 2021-08-19: 20 mg via INTRAVENOUS
  Filled 2021-08-19: qty 100

## 2021-08-19 NOTE — Patient Instructions (Signed)
[facility]  Discharge Instructions: Thank you for choosing [facility] to provide your oncology and hematology care.   If you have a lab appointment with the [facility], please go directly to the [facility] and check in at the registration area.   Wear comfortable clothing and clothing appropriate for easy access to any Portacath or PICC line.   We strive to give you quality time with your provider. You may need to reschedule your appointment if you arrive late (15 or more minutes).  Arriving late affects you and other patients whose appointments are after yours.  Also, if you miss three or more appointments without notifying the office, you may be dismissed from the clinic at the provider's discretion.      For prescription refill requests, have your pharmacy contact our office and allow 72 hours for refills to be completed.    Today you received the following chemotherapy and/or immunotherapy agents Taxol      To help prevent nausea and vomiting after your treatment, we encourage you to take your nausea medication as directed.  BELOW ARE SYMPTOMS THAT SHOULD BE REPORTED IMMEDIATELY: *FEVER GREATER THAN 100.4 F (38 C) OR HIGHER *CHILLS OR SWEATING *NAUSEA AND VOMITING THAT IS NOT CONTROLLED WITH YOUR NAUSEA MEDICATION *UNUSUAL SHORTNESS OF BREATH *UNUSUAL BRUISING OR BLEEDING *URINARY PROBLEMS (pain or burning when urinating, or frequent urination) *BOWEL PROBLEMS (unusual diarrhea, constipation, pain near the anus) TENDERNESS IN MOUTH AND THROAT WITH OR WITHOUT PRESENCE OF ULCERS (sore throat, sores in mouth, or a toothache) UNUSUAL RASH, SWELLING OR PAIN  UNUSUAL VAGINAL DISCHARGE OR ITCHING   Items with * indicate a potential emergency and should be followed up as soon as possible or go to the Emergency Department if any problems should occur.  Please show the CHEMOTHERAPY ALERT CARD or IMMUNOTHERAPY ALERT CARD at check-in to the  Emergency Department and triage nurse.  Should you have questions after your visit or need to cancel or reschedule your appointment, please contact [facility]  Dept: [phone]  and follow the prompts.  Office hours are 8:00 a.m. to 4:30 p.m. Monday - Friday. Please note that voicemails left after 4:00 p.m. may not be returned until the following business day.  We are closed weekends and major holidays. You have access to a nurse at all times for urgent questions. Please call the main number to the clinic Dept: [phone] and follow the prompts.   For any non-urgent questions, you may also contact your provider using MyChart. We now offer e-Visits for anyone 60 and older to request care online for non-urgent symptoms. For details visit [email or web address].   Also download the MyChart app! Go to the app store, search "MyChart", open the app, select [facility], and log in with your MyChart username and password.  Due to Covid, a mask is required upon entering the hospital/clinic. If you do not have a mask, one will be given to you upon arrival. For doctor visits, patients may have 1 support person aged 60 or older with them. For treatment visits, patients cannot have anyone with them due to current Covid guidelines and our immunocompromised population.

## 2021-08-20 ENCOUNTER — Other Ambulatory Visit: Payer: Self-pay

## 2021-08-20 ENCOUNTER — Inpatient Hospital Stay: Payer: BC Managed Care – PPO

## 2021-08-20 VITALS — BP 145/79 | HR 98 | Temp 98.1°F | Resp 18

## 2021-08-20 DIAGNOSIS — Z171 Estrogen receptor negative status [ER-]: Secondary | ICD-10-CM

## 2021-08-20 DIAGNOSIS — Z5112 Encounter for antineoplastic immunotherapy: Secondary | ICD-10-CM | POA: Diagnosis not present

## 2021-08-20 DIAGNOSIS — C50211 Malignant neoplasm of upper-inner quadrant of right female breast: Secondary | ICD-10-CM

## 2021-08-20 LAB — T4: T4, Total: 7.4 ug/dL (ref 4.5–12.0)

## 2021-08-20 MED ORDER — FILGRASTIM-AAFI 480 MCG/0.8ML IJ SOSY
480.0000 ug | PREFILLED_SYRINGE | Freq: Once | INTRAMUSCULAR | Status: AC
Start: 2021-08-20 — End: 2021-08-20
  Administered 2021-08-20: 480 ug via SUBCUTANEOUS
  Filled 2021-08-20: qty 0.8

## 2021-08-21 ENCOUNTER — Inpatient Hospital Stay: Payer: BC Managed Care – PPO

## 2021-08-21 VITALS — BP 119/75 | HR 88 | Temp 98.8°F | Resp 18

## 2021-08-21 DIAGNOSIS — C50211 Malignant neoplasm of upper-inner quadrant of right female breast: Secondary | ICD-10-CM

## 2021-08-21 DIAGNOSIS — Z171 Estrogen receptor negative status [ER-]: Secondary | ICD-10-CM

## 2021-08-21 DIAGNOSIS — Z5112 Encounter for antineoplastic immunotherapy: Secondary | ICD-10-CM | POA: Diagnosis not present

## 2021-08-21 MED ORDER — FILGRASTIM-AAFI 480 MCG/0.8ML IJ SOSY
480.0000 ug | PREFILLED_SYRINGE | Freq: Once | INTRAMUSCULAR | Status: AC
  Administered 2021-08-21: 480 ug via SUBCUTANEOUS
  Filled 2021-08-21: qty 0.8

## 2021-08-22 ENCOUNTER — Other Ambulatory Visit: Payer: Self-pay

## 2021-08-22 ENCOUNTER — Inpatient Hospital Stay: Payer: BC Managed Care – PPO

## 2021-08-22 VITALS — BP 145/82 | HR 98 | Temp 98.4°F | Resp 20

## 2021-08-22 DIAGNOSIS — C50211 Malignant neoplasm of upper-inner quadrant of right female breast: Secondary | ICD-10-CM

## 2021-08-22 DIAGNOSIS — Z5112 Encounter for antineoplastic immunotherapy: Secondary | ICD-10-CM | POA: Diagnosis not present

## 2021-08-22 DIAGNOSIS — Z171 Estrogen receptor negative status [ER-]: Secondary | ICD-10-CM

## 2021-08-22 MED ORDER — FILGRASTIM-AAFI 480 MCG/0.8ML IJ SOSY
480.0000 ug | PREFILLED_SYRINGE | Freq: Once | INTRAMUSCULAR | Status: AC
  Administered 2021-08-22: 480 ug via SUBCUTANEOUS
  Filled 2021-08-22: qty 0.8

## 2021-08-25 ENCOUNTER — Encounter: Payer: Self-pay | Admitting: Oncology

## 2021-08-25 MED FILL — Fosaprepitant Dimeglumine For IV Infusion 150 MG (Base Eq): INTRAVENOUS | Qty: 5 | Status: AC

## 2021-08-25 MED FILL — Dexamethasone Sodium Phosphate Inj 100 MG/10ML: INTRAMUSCULAR | Qty: 1 | Status: AC

## 2021-08-25 NOTE — Telephone Encounter (Signed)
No entry 

## 2021-08-25 NOTE — Progress Notes (Signed)
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   She started her neoadjuvant chemotherapy, consisting of carboplatin paclitaxel pembrolizumab for 4 cycles, on 06/24/2021. Today is day 1 cycle 4.    She maintains an excellent functional status and just returned from a trip to [place] which she greatly enjoyed.  She is heading out to a football game later today at a [employer]  REVIEW OF SYSTEMS: Laraina tells me her feet feel "cold".  There is no actual numbness or tingling and its not mostly in the toes or ball of the foot.  It is more the sole of the foot.  She has no similar sensations in her hands.  Aside from that a detailed review of systems today was stable  COVID 19 VACCINATION STATUS:  Status post Pfizer x3 as of August 2022   HISTORY OF CURRENT ILLNESS: From the original intake note:  Rita Davis herself noted a mass in her right breast.  She brought it to medical attention and her mammogram was moved up to 06/03/2021 at the breast center.  This found the breast to be density category C.  On physical exam there was a palpable firm fixed mass in the upper inner quadrant of the right breast.  On the mammogram there was an irregular mass with spiculated borders which on ultrasound measured 2.9 cm at the 1 o'clock  position 10 cm from the nipple.  The right axilla was sonographically benign.  Biopsy of the right breast mass in question 06/03/2021 showed an invasive ductal carcinoma, grade 3, estrogen receptor "positive" at 5% with weak staining intensity (functionally estrogen receptor negative), progesterone receptor negative, HER2 equivocal by immunotherapy but negative by FISH, with a proliferation marker of 95%.  Cancer Staging Malignant neoplasm of upper-inner quadrant of right breast in female, estrogen receptor negative ([place]) Staging form: Breast, AJCC 8th Edition - Clinical: Stage IIB (cT2, cN0, cM0, G3, ER-, PR-, HER2-) - Signed by Chauncey Cruel, MD on 06/09/2021  The patient's subsequent history is as detailed below   PAST MEDICAL HISTORY: Past Medical History:  Diagnosis Date   Anemia    had a fibroid tumor, was anemic at that time   Anxiety    Cancer [facility])    breast cancer   Family history of breast cancer    Family history of colon cancer    Family history of prostate cancer    Hyperlipidemia    Hypertension    Pneumonia    as a child   Pre-diabetes     PAST SURGICAL HISTORY: Past Surgical History:  Procedure Laterality Date   ABDOMINAL HYSTERECTOMY     still has ovaries   BREAST BIOPSY Right 05/23/2012   BREAST CYST EXCISION Right    cyst removed     from lower back   DILATION AND CURETTAGE OF UTERUS     ORIF ANKLE FRACTURE Left 11/13/2020   Procedure: OPEN TREATMENT  OF LEFT TRIMALLEOLAR ANKLE FRACTURE WITH POSTERIOR FIXATION, SYNDESMOSIS;  Surgeon: Erle Crocker, MD;  Location: [facility];  Service: Orthopedics;  Laterality: Left;  LENGTH OF SURGERY: 1.5 HOURS   PORTACATH PLACEMENT Left 06/18/2021   Procedure: PORT PLACEMENT;  Surgeon: Stark Klein, MD;  Location: [facility];  Service: General;  Laterality: Left;    FAMILY HISTORY Family History  Problem Relation Age of Onset   Hypertension Mother    Breast cancer Mother 60       declined  treatment   Prostate cancer Father        metastatic, dx 34s   Colon cancer Brother 66   Cancer Maternal Great-grandmother 23       gynecologic cancer (MGM's mother)  The patient's father died at the age of 110, having had breast cancer twice.  The patient has little information on his side of the family.  The patient's mother is 60 years old as of August 2022.  She is currently under hospice care for metastatic breast cancer having refused active treatment (she was evaluated by Dr. Lindi Adie).  Also on the maternal side there is a great grandmother with ovarian cancer.  The patient had one half brother who died from colon cancer at the age of 58.  Another half brother survives.   GYNECOLOGIC HISTORY:  No LMP recorded. Patient has had a hysterectomy. Menarche: 60 years old Age at first live birth: 60 years old [place] P 2 LMP 01-07-2015 Contraceptive several years with no complications HRT no Hysterectomy?  Yes Salpingo-oophorectomy?no   SOCIAL HISTORY:  [name] retired as Psychologist, counselling in 2020/01/08.  Her husband died 03-27-2020 from a cardiac arrest in the setting of renal failure.  The patient lives by herself with no pets.  Son Timmothy Davis graduated from [employer] and works for [employer] in [place].  Son Rita Davis graduated from [employer] with a business administration degree and placed in a band    ADVANCED DIRECTIVES: Not in place.  As of 06/09/2021 the patient has not discussed her diagnosis of breast cancer with her children.   HEALTH MAINTENANCE: Social History   Tobacco Use   Smoking status: Never   Smokeless tobacco: Never  Substance Use Topics   Alcohol use: No   Drug use: No     Colonoscopy:  PAP:  Bone density:   No Known Allergies  Current Outpatient Medications  Medication Sig Dispense Refill   rosuvastatin (CRESTOR) 20 MG tablet Take 1 tablet (20 mg total) by mouth daily.     amLODipine-olmesartan (AZOR) 5-40 MG tablet Take 1 tablet by mouth in the morning.      betamethasone valerate (VALISONE) 0.1 % cream Apply 1 application topically daily as needed (skin irritation.).     bimatoprost (LATISSE) 0.03 % ophthalmic solution Place one drop on applicator and apply evenly along the skin of the upper eyelid at base of eyelashes once daily at bedtime; repeat procedure for second eye (use a clean applicator).     Calcium-Magnesium-Zinc (CAL-MAG-ZINC PO) Take 1 tablet by mouth every other day. In the morning     Cholecalciferol (VITAMIN D3) 250 MCG (10000 UT) TABS Take 10,000 Units by mouth daily.     dexamethasone (DECADRON) 4 MG tablet Take 2 tablets once a day for 3 days after carboplatin and AC chemotherapy. Take with food. 30 tablet 1   escitalopram (LEXAPRO) 10 MG tablet Take 10 mg by mouth at bedtime.     lidocaine-prilocaine (EMLA) cream Apply to  affected area once 30 g 3   metFORMIN (GLUCOPHAGE) 500 MG tablet Take by mouth daily with breakfast.     Misc Natural Products (SAMBUCUS ELDERBERRY IMMUNE PO) Take 5 mLs by mouth daily.     Misc Natural Products (SUPER GREENS) POWD Take 1 Scoop by mouth daily.     prochlorperazine (COMPAZINE) 10 MG tablet Take 1 tablet (10 mg total) by mouth every 6 (six) hours as needed (Nausea or vomiting). 30 tablet 1   No current facility-administered medications for this visit.   Facility-Administered Medications Ordered in Other Visits  Medication Dose Route Frequency Provider Last Rate Last Admin   CARBOplatin (PARAPLATIN) 510 mg in sodium chloride 0.9 % 250 mL chemo infusion  510 mg Intravenous Once Otha Rickles, Virgie Dad, MD       dexamethasone (DECADRON) 10 mg in sodium chloride 0.9 % 50 mL IVPB  10 mg Intravenous Once Coraleigh Sheeran, Virgie Dad, MD       famotidine (PEPCID) IVPB 20 mg in NS 100 mL IVPB  20 mg Intravenous Once Duran Ohern, Virgie Dad, MD       fosaprepitant (EMEND) 150 mg in sodium chloride 0.9 % 145 mL IVPB  150 mg Intravenous Once Leidi Astle, Virgie Dad, MD       heparin lock flush 100 unit/mL  500 Units Intracatheter  Once PRN Bogdan Vivona, Virgie Dad, MD       PACLitaxel (TAXOL) 168 mg in sodium chloride 0.9 % 250 mL chemo infusion (</= 1m/m2)  80 mg/m2 (Treatment Plan Recorded) Intravenous Once Theresia Pree, GVirgie Dad MD       pembrolizumab ([facility] 200 mg in sodium chloride 0.9 % 50 mL chemo infusion  200 mg Intravenous Once Jacquelynne Guedes, GVirgie Dad MD       sodium chloride flush (NS) 0.9 % injection 10 mL  10 mL Intracatheter PRN Vanita Cannell, GVirgie Dad MD        OBJECTIVE: African-American woman who appears well  Vitals:   08/26/21 0910  BP: 127/77  Pulse: (!) 106  Resp: 17  Temp: (!) 97.2 F (36.2 C)  SpO2: 100%      Body mass index is 30.3 kg/m.   Wt Readings from Last 3 Encounters:  08/26/21 199 lb 4 oz (90.4 kg)  08/19/21 201 lb 12 oz (91.5 kg)  08/12/21 204 lb 12 oz (92.9 kg)     ECOG FS:1 - Symptomatic but completely ambulatory  Sclerae unicteric, EOMs intact Wearing a mask No cervical or supraclavicular adenopathy Lungs no rales or rhonchi Heart regular rate and rhythm Abd soft, nontender, positive bowel sounds MSK no focal spinal tenderness, no upper extremity lymphedema Neuro: nonfocal, well oriented, appropriate affect Breasts: I do not palpate a mass in the right breast.  There are no skin or nipple changes of concern.  The left breast and both axillae are benign.   LAB RESULTS:  CMP     Component Value Date/Time   NA 140 08/26/2021 0842   K 4.0 08/26/2021 0842   CL 107 08/26/2021 0842   CO2 22 08/26/2021 0842   GLUCOSE 121 (H) 08/26/2021 0842   BUN 19 08/26/2021 0842   CREATININE 1.13 (H) 08/26/2021 0842   CALCIUM 9.6 08/26/2021 0842   PROT 6.8 08/26/2021 0842   ALBUMIN 3.8 08/26/2021 0842   AST 23 08/26/2021 0842   ALT 30 08/26/2021 0842   ALKPHOS 95 08/26/2021 0842   BILITOT 0.3 08/26/2021 0842   GFRNONAA 56 (L) 08/26/2021 0842   GFRAA 86 04/18/2008 1652  No results found for: TOTALPROTELP, ALBUMINELP, A1GS, A2GS, BETS, BETA2SER, GAMS, MSPIKE, SPEI  No results  found for: KPAFRELGTCHN, LAMBDASER, [facility]  Lab Results  Component Value Date   WBC 17.6 (H) 08/26/2021   NEUTROABS 10.7 (H) 08/26/2021   HGB 9.8 (L) 08/26/2021   HCT 29.5 (L) 08/26/2021   MCV 93.4 08/26/2021   PLT 316 08/26/2021      Chemistry      Component Value Date/Time   NA 140 08/26/2021 0842   K 4.0 08/26/2021 0842   CL 107 08/26/2021 0842   CO2 22 08/26/2021 0842   BUN 19 08/26/2021 0842   CREATININE 1.13 (H) 08/26/2021 0842      Component Value Date/Time   CALCIUM 9.6 08/26/2021 0842   ALKPHOS 95 08/26/2021 0842   AST 23 08/26/2021 0842   ALT 30 08/26/2021 0842   BILITOT 0.3 08/26/2021 0842       No results found for: LABCA2  No components found for: [id number]  No results for input(s): INR in the last 168 hours.  No results found for: LABCA2  No results found for: JSH702  No results found for: OVZ858  No results found for: IFO277  No results found for: CA2729  No components found for: HGQUANT  No results found for: CEA1 / No results found for: CEA1   No results found for: AFPTUMOR  No results found for: CHROMOGRNA  No results found for: TOTALPROTELP, ALBUMINELP, A1GS, A2GS, BETS, BETA2SER, GAMS, MSPIKE, SPEI (this displays SPEP labs)  No results found for: KPAFRELGTCHN, LAMBDASER, KAPLAMBRATIO (kappa/lambda light chains)  No results found for: HGBA, HGBA2QUANT, HGBFQUANT, HGBSQUAN (Hemoglobinopathy evaluation)   No results found for: LDH  No results found for: IRON, TIBC, IRONPCTSAT (Iron and TIBC)  No results found for: FERRITIN  Urinalysis    Component Value Date/Time   COLORURINE YELLOW 11/20/2007 0951   APPEARANCEUR [place] 11/20/2007 0951   LABSPEC > OR = 1.030 11/20/2007 0951   PHURINE 5.5 11/20/2007 [place] 11/20/2007 [place] 11/20/2007 0951   KETONESUR NEGATIVE 11/20/2007 0951   UROBILINOGEN 0.2 mg/dL 11/20/2007 0951   NITRITE Negative 11/20/2007 0951   LEUKOCYTESUR  Negative 11/20/2007 0951    STUDIES: No results found.   ELIGIBLE FOR AVAILABLE RESEARCH PROTOCOL: no  ASSESSMENT: 60 y.o. [place] woman status post right breast upper inner quadrant biopsy 06/03/2021 for a clinical T2N0 invasive ductal carcinoma, grade 3, functionally triple negative, with an MIB-1 of 95%.  (1) genetics testing 06/30/2021 found no deleterious mutations in APC, ATM, AXIN2, BARD1, BMPR1A, BRCA1, BRCA2, BRIP1, CDH1, CDK4*, CDKN2A (p14ARF)*, CDKN2A (p16INK4a)*, CHEK2, CTNNA1, DICER1, EPCAM (Deletion/duplication testing only), GREM1 (promoter region deletion/duplication testing only), KIT, MEN1, MLH1, MSH2, MSH3, MSH6, MUTYH, NBN, NF1, NHTL1, PALB2, PDGFRA*, PMS2, POLD1, POLE, PTEN, RAD50, RAD51C, RAD51D, SDHB, SDHC, SDHD, SMAD4, SMARCA4. STK11, TP53, TSC1, TSC2, and VHL.  The following genes were evaluated for sequence changes only: SDHA and HOXB13 c.251G>A variant only. The result report date was 06/30/2021.  (A) a variant of uncertain significance was detected in the BRIP1 and TSC2 genes.  (2) neoadjuvant chemotherapy consisting of carboplatin paclitaxel and pembrolizumab for 4 cycles starting 06/23/2021, to be followed by doxorubicin and cyclophosphamide and pembrolizumab for 4 cycles, with pembrolizumab to be continued to complete a year  (3) definitive surgery to follow  (4) adjuvant radiation as appropriate   PLAN: Shanina is tolerating chemotherapy generally well and we are proceeding to the fourth cycle of the first series.  This will complete her carboplatin/paclitaxel treatments.  While she does not give me classic symptoms for neuropathy pump, I am concerned about this "cold" feeling she reports in the soles of her feet.  I am going to pop in on her day 8 treatment to make sure there has been no progression and if so we would discontinue the Taxol but if not we will complete this cycle.  3 weeks from now of course she will see me again and she will be ready to  start cyclophosphamide and doxorubicin at that time.  Those are not associated with peripheral neuropathy problems  Total encounter time 30 minutes.Sarajane Jews C. Ashly Goethe, MD 08/26/21 10:30 AM Medical Oncology and Hematology [facility] [place], [place] [address] Tel. [phone]    Fax. [phone]   I, Wilburn Mylar, am acting as scribe for Dr. Virgie Dad. Derwin Reddy.  I, Lurline Del MD, have reviewed the above documentation for accuracy and completeness, and I agree with the above.    *Total Encounter Time as defined by the Centers for Medicare and Medicaid Services includes, in addition to the face-to-face time of a patient visit (documented in the note above) non-face-to-face time: obtaining and reviewing outside history, ordering and reviewing medications, tests or procedures, care coordination (communications with other health care professionals or caregivers) and documentation in the medical record.

## 2021-08-26 ENCOUNTER — Inpatient Hospital Stay (HOSPITAL_BASED_OUTPATIENT_CLINIC_OR_DEPARTMENT_OTHER): Payer: BC Managed Care – PPO | Admitting: Oncology

## 2021-08-26 ENCOUNTER — Inpatient Hospital Stay: Payer: BC Managed Care – PPO

## 2021-08-26 ENCOUNTER — Other Ambulatory Visit: Payer: Self-pay

## 2021-08-26 ENCOUNTER — Encounter: Payer: Self-pay | Admitting: *Deleted

## 2021-08-26 VITALS — BP 127/77 | HR 106 | Temp 97.2°F | Resp 17 | Wt 199.2 lb

## 2021-08-26 VITALS — HR 97

## 2021-08-26 DIAGNOSIS — Z171 Estrogen receptor negative status [ER-]: Secondary | ICD-10-CM | POA: Diagnosis not present

## 2021-08-26 DIAGNOSIS — C50211 Malignant neoplasm of upper-inner quadrant of right female breast: Secondary | ICD-10-CM | POA: Diagnosis not present

## 2021-08-26 DIAGNOSIS — Z95828 Presence of other vascular implants and grafts: Secondary | ICD-10-CM

## 2021-08-26 DIAGNOSIS — Z5112 Encounter for antineoplastic immunotherapy: Secondary | ICD-10-CM | POA: Diagnosis not present

## 2021-08-26 LAB — CBC WITH DIFFERENTIAL (CANCER CENTER ONLY)
Abs Immature Granulocytes: 2.1 10*3/uL — ABNORMAL HIGH (ref 0.00–0.07)
Band Neutrophils: 16 %
Basophils Absolute: 0 10*3/uL (ref 0.0–0.1)
Basophils Relative: 0 %
Eosinophils Absolute: 0 10*3/uL (ref 0.0–0.5)
Eosinophils Relative: 0 %
HCT: 29.5 % — ABNORMAL LOW (ref 36.0–46.0)
Hemoglobin: 9.8 g/dL — ABNORMAL LOW (ref 12.0–15.0)
Lymphocytes Relative: 19 %
Lymphs Abs: 3.3 10*3/uL (ref 0.7–4.0)
MCH: 31 pg (ref 26.0–34.0)
MCHC: 33.2 g/dL (ref 30.0–36.0)
MCV: 93.4 fL (ref 80.0–100.0)
Metamyelocytes Relative: 9 %
Monocytes Absolute: 1.4 10*3/uL — ABNORMAL HIGH (ref 0.1–1.0)
Monocytes Relative: 8 %
Myelocytes: 3 %
Neutro Abs: 10.7 10*3/uL — ABNORMAL HIGH (ref 1.7–7.7)
Neutrophils Relative %: 45 %
Platelet Count: 316 10*3/uL (ref 150–400)
RBC: 3.16 MIL/uL — ABNORMAL LOW (ref 3.87–5.11)
RDW: 19.5 % — ABNORMAL HIGH (ref 11.5–15.5)
Smear Review: NORMAL
WBC Count: 17.6 10*3/uL — ABNORMAL HIGH (ref 4.0–10.5)
nRBC: 0.6 % — ABNORMAL HIGH (ref 0.0–0.2)

## 2021-08-26 LAB — CMP (CANCER CENTER ONLY)
ALT: 30 U/L (ref 0–44)
AST: 23 U/L (ref 15–41)
Albumin: 3.8 g/dL (ref 3.5–5.0)
Alkaline Phosphatase: 95 U/L (ref 38–126)
Anion gap: 11 (ref 5–15)
BUN: 19 mg/dL (ref 6–20)
CO2: 22 mmol/L (ref 22–32)
Calcium: 9.6 mg/dL (ref 8.9–10.3)
Chloride: 107 mmol/L (ref 98–111)
Creatinine: 1.13 mg/dL — ABNORMAL HIGH (ref 0.44–1.00)
GFR, Estimated: 56 mL/min — ABNORMAL LOW (ref >60)
Glucose, Bld: 121 mg/dL — ABNORMAL HIGH (ref 70–99)
Potassium: 4 mmol/L (ref 3.5–5.1)
Sodium: 140 mmol/L (ref 135–145)
Total Bilirubin: 0.3 mg/dL (ref 0.3–1.2)
Total Protein: 6.8 g/dL (ref 6.5–8.1)

## 2021-08-26 LAB — TSH: TSH: 1.344 u[IU]/mL (ref 0.308–3.960)

## 2021-08-26 MED ORDER — DIPHENHYDRAMINE HCL 50 MG/ML IJ SOLN
25.0000 mg | Freq: Once | INTRAMUSCULAR | Status: AC
  Administered 2021-08-26: 25 mg via INTRAVENOUS
  Filled 2021-08-26: qty 1

## 2021-08-26 MED ORDER — SODIUM CHLORIDE 0.9 % IV SOLN
150.0000 mg | Freq: Once | INTRAVENOUS | Status: AC
  Administered 2021-08-26: 150 mg via INTRAVENOUS
  Filled 2021-08-26: qty 150

## 2021-08-26 MED ORDER — SODIUM CHLORIDE 0.9% FLUSH
10.0000 mL | INTRAVENOUS | Status: DC | PRN
  Administered 2021-08-26: 10 mL

## 2021-08-26 MED ORDER — SODIUM CHLORIDE 0.9 % IV SOLN
200.0000 mg | Freq: Once | INTRAVENOUS | Status: AC
  Administered 2021-08-26: 200 mg via INTRAVENOUS
  Filled 2021-08-26: qty 8

## 2021-08-26 MED ORDER — PALONOSETRON HCL INJECTION 0.25 MG/5ML
0.2500 mg | Freq: Once | INTRAVENOUS | Status: AC
  Administered 2021-08-26: 0.25 mg via INTRAVENOUS
  Filled 2021-08-26: qty 5

## 2021-08-26 MED ORDER — SODIUM CHLORIDE 0.9 % IV SOLN
80.0000 mg/m2 | Freq: Once | INTRAVENOUS | Status: AC
  Administered 2021-08-26: 168 mg via INTRAVENOUS
  Filled 2021-08-26: qty 28

## 2021-08-26 MED ORDER — HEPARIN SOD (PORK) LOCK FLUSH 100 UNIT/ML IV SOLN
500.0000 [IU] | Freq: Once | INTRAVENOUS | Status: AC | PRN
  Administered 2021-08-26: 500 [IU]

## 2021-08-26 MED ORDER — SODIUM CHLORIDE 0.9 % IV SOLN: Freq: Once | INTRAVENOUS | Status: AC

## 2021-08-26 MED ORDER — CARBOPLATIN CHEMO INJECTION 600 MG/60ML
507.0000 mg | Freq: Once | INTRAVENOUS | Status: AC
  Administered 2021-08-26: 510 mg via INTRAVENOUS
  Filled 2021-08-26: qty 51

## 2021-08-26 MED ORDER — SODIUM CHLORIDE 0.9 % IV SOLN
10.0000 mg | Freq: Once | INTRAVENOUS | Status: AC
  Administered 2021-08-26: 10 mg via INTRAVENOUS
  Filled 2021-08-26: qty 10

## 2021-08-26 MED ORDER — FAMOTIDINE 20 MG IN NS 100 ML IVPB
20.0000 mg | Freq: Once | INTRAVENOUS | Status: AC
Start: 2021-08-26 — End: 2021-08-26
  Administered 2021-08-26: 20 mg via INTRAVENOUS
  Filled 2021-08-26: qty 100

## 2021-08-26 MED ORDER — SODIUM CHLORIDE 0.9% FLUSH
10.0000 mL | Freq: Once | INTRAVENOUS | Status: AC
  Administered 2021-08-26: 10 mL

## 2021-08-26 NOTE — Patient Instructions (Signed)
[facility] MEDICAL ONCOLOGY  Discharge Instructions: Thank you for choosing [facility] to provide your oncology and hematology care.   If you have a lab appointment with the Cancer Center, please go directly to the Cancer Center and check in at the registration area.   Wear comfortable clothing and clothing appropriate for easy access to any Portacath or PICC line.   We strive to give you quality time with your provider. You may need to reschedule your appointment if you arrive late (15 or more minutes).  Arriving late affects you and other patients whose appointments are after yours.  Also, if you miss three or more appointments without notifying the office, you may be dismissed from the clinic at the provider's discretion.      For prescription refill requests, have your pharmacy contact our office and allow 72 hours for refills to be completed.    Today you received the following chemotherapy and/or immunotherapy agents: Keytruda, Taxol, Carboplatin     To help prevent nausea and vomiting after your treatment, we encourage you to take your nausea medication as directed.  BELOW ARE SYMPTOMS THAT SHOULD BE REPORTED IMMEDIATELY: . *FEVER GREATER THAN 100.4 F (38 C) OR HIGHER . *CHILLS OR SWEATING . *NAUSEA AND VOMITING THAT IS NOT CONTROLLED WITH YOUR NAUSEA MEDICATION . *UNUSUAL SHORTNESS OF BREATH . *UNUSUAL BRUISING OR BLEEDING . *URINARY PROBLEMS (pain or burning when urinating, or frequent urination) . *BOWEL PROBLEMS (unusual diarrhea, constipation, pain near the anus) . TENDERNESS IN MOUTH AND THROAT WITH OR WITHOUT PRESENCE OF ULCERS (sore throat, sores in mouth, or a toothache) . UNUSUAL RASH, SWELLING OR PAIN  . UNUSUAL VAGINAL DISCHARGE OR ITCHING   Items with * indicate a potential emergency and should be followed up as soon as possible or go to the Emergency Department if any problems should occur.  Please show the CHEMOTHERAPY ALERT CARD or  IMMUNOTHERAPY ALERT CARD at check-in to the Emergency Department and triage nurse.  Should you have questions after your visit or need to cancel or reschedule your appointment, please contact [facility]  Dept: [phone]  and follow the prompts.  Office hours are 8:00 a.m. to 4:30 p.m. Monday - Friday. Please note that voicemails left after 4:00 p.m. may not be returned until the following business day.  We are closed weekends and major holidays. You have access to a nurse at all times for urgent questions. Please call the main number to the clinic Dept: [phone] and follow the prompts.   For any non-urgent questions, you may also contact your provider using MyChart. We now offer e-Visits for anyone 18 and older to request care online for non-urgent symptoms. For details visit [email or web address].   Also download the MyChart app! Go to the app store, search "MyChart", open the app, select [facility], and log in with your MyChart username and password.  Due to Covid, a mask is required upon entering the hospital/clinic. If you do not have a mask, one will be given to you upon arrival. For doctor visits, patients may have 1 support person aged 18 or older with them. For treatment visits, patients cannot have anyone with them due to current Covid guidelines and our immunocompromised population.   

## 2021-08-27 LAB — T4: T4, Total: 7.7 ug/dL (ref 4.5–12.0)

## 2021-09-01 MED FILL — Dexamethasone Sodium Phosphate Inj 100 MG/10ML: INTRAMUSCULAR | Qty: 1 | Status: AC

## 2021-09-01 NOTE — Progress Notes (Signed)
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   She started her neoadjuvant chemotherapy, consisting of carboplatin paclitaxel pembrolizumab for 4 cycles, on 06/24/2021. Today is day 8 cycle 4.    I am seeing her in the treatment area today just to make sure her neuropathy if any has not progressed since at the last visit she reported some questionable symptoms  She is scheduled to start her fifth cycle, which will consist of cyclophosphamide and doxorubicin together with pembrolizumab, on 09/16/2021.  Her echo at baseline showed an ejection fraction in the 55 to 60% range.   REVIEW OF SYSTEMS: Rita Davis had a great time at home coming.  This past weekend however she was walking on a trail and after about 2 miles she felt extremely tired and had to sit down.  She was not short of breath.  She did not have palpitations or chest pain or pressure.  After resting for a little bit she got up and was fine.  A detailed review of systems was otherwise stable  COVID 19 VACCINATION STATUS:  Status post Pfizer x3 as of August 2022   HISTORY OF CURRENT ILLNESS: From the original intake note:  Rita Davis herself noted a mass in her right breast.  She brought it to medical attention and her mammogram was moved up to  06/03/2021 at the breast center.  This found the breast to be density category C.  On physical exam there was a palpable firm fixed mass in the upper inner quadrant of the right breast.  On the mammogram there was an irregular mass with spiculated borders which on ultrasound measured 2.9 cm at the 1 o'clock position 10 cm from the nipple.  The right axilla was sonographically benign.  Biopsy of the right breast mass in question 06/03/2021 showed an invasive ductal carcinoma, grade 3, estrogen receptor "positive" at 5% with weak staining intensity (functionally estrogen receptor negative), progesterone receptor negative, HER2 equivocal by immunotherapy but negative by FISH, with a proliferation marker of 95%.  Cancer Staging Malignant neoplasm of upper-inner quadrant of right breast in female, estrogen receptor negative ([place]) Staging form: Breast, AJCC 8th Edition - Clinical: Stage IIB (cT2, cN0, cM0, G3, ER-, PR-, HER2-) - Signed by Chauncey Cruel, MD on 06/09/2021  The patient's subsequent history is as detailed below   PAST MEDICAL HISTORY: Past Medical History:  Diagnosis Date   Anemia    had a fibroid tumor, was anemic at that time   Anxiety    Cancer [facility])    breast cancer   Family history of breast cancer    Family history of colon cancer    Family history of prostate cancer    Hyperlipidemia    Hypertension    Pneumonia    as a child   Pre-diabetes     PAST SURGICAL HISTORY: Past Surgical History:  Procedure Laterality Date   ABDOMINAL HYSTERECTOMY  still has ovaries   BREAST BIOPSY Right 05/23/2012   BREAST CYST EXCISION Right    cyst removed     from lower back   DILATION AND CURETTAGE OF UTERUS     ORIF ANKLE FRACTURE Left 11/13/2020   Procedure: OPEN TREATMENT OF LEFT TRIMALLEOLAR ANKLE FRACTURE WITH POSTERIOR FIXATION, SYNDESMOSIS;  Surgeon: Erle Crocker, MD;  Location: [place];  Service: Orthopedics;  Laterality: Left;  LENGTH OF  SURGERY: 1.5 HOURS   PORTACATH PLACEMENT Left 06/18/2021   Procedure: PORT PLACEMENT;  Surgeon: Stark Klein, MD;  Location: [place];  Service: General;  Laterality: Left;    FAMILY HISTORY Family History  Problem Relation Age of Onset   Hypertension Mother    Breast cancer Mother 67       declined treatment   Prostate cancer Father        metastatic, dx 67s   Colon cancer Brother 37   Cancer Maternal Great-grandmother 58       gynecologic cancer (MGM's mother)  The patient's father died at the age of 53, having had breast cancer twice.  The patient has little information on his side of the family.  The patient's mother is 10 years old as of August 2022.  She is currently under hospice care for metastatic breast cancer having refused active treatment (she was evaluated by Dr. Lindi Adie).  Also on the maternal side there is a great grandmother with ovarian cancer.  The patient had one half brother who died from colon cancer at the age of 54.  Another half brother survives.   GYNECOLOGIC HISTORY:  No LMP recorded. Patient has had a hysterectomy. Menarche: 60 years old Age at first live birth: 60 years old [place] P 2 LMP 01/31/15 Contraceptive several years with no complications HRT no Hysterectomy?  Yes Salpingo-oophorectomy?no   SOCIAL HISTORY:  Rita Davis retired as Psychologist, counselling in 01-31-20.  Her husband died 2020-03-30 from a cardiac arrest in the setting of renal failure.  The patient lives by herself with no pets.  Son Rita Davis graduated from [employer] and works for [employer] in [place].  Son Rita Davis graduated from [employer] with a business administration degree and placed in a band    ADVANCED DIRECTIVES: Not in place.  As of 06/09/2021 the patient has not discussed her diagnosis of breast cancer with her children.   HEALTH MAINTENANCE: Social History   Tobacco Use   Smoking status: Never   Smokeless tobacco: Never  Substance Use Topics   Alcohol use: No   Drug use: No      Colonoscopy:  PAP:  Bone density:   No Known Allergies  Current Outpatient Medications  Medication Sig Dispense Refill   amLODipine-olmesartan (AZOR) 5-40 MG tablet Take 1 tablet by mouth in the morning.     betamethasone valerate (VALISONE) 0.1 % cream Apply 1 application topically daily as needed (skin irritation.).     bimatoprost (LATISSE) 0.03 % ophthalmic solution Place one drop on applicator and apply evenly along the skin of the upper eyelid at base of eyelashes once daily at bedtime; repeat procedure for second eye (use a clean applicator).     Calcium-Magnesium-Zinc (CAL-MAG-ZINC PO) Take 1 tablet by mouth every other day. In the morning     Cholecalciferol (VITAMIN D3) 250 MCG (10000 UT) TABS Take 10,000 Units by mouth daily.     dexamethasone (DECADRON) 4 MG tablet Take 2 tablets once a day for 3 days after carboplatin and AC  chemotherapy. Take with food. 30 tablet 1   escitalopram (LEXAPRO) 10 MG tablet Take 10 mg by mouth at bedtime.     lidocaine-prilocaine (EMLA) cream Apply to affected area once 30 g 3   metFORMIN (GLUCOPHAGE) 500 MG tablet Take by mouth daily with breakfast.     Misc Natural Products (SAMBUCUS ELDERBERRY IMMUNE PO) Take 5 mLs by mouth daily.     Misc Natural Products (SUPER GREENS) POWD Take 1 Scoop by mouth daily.     prochlorperazine (COMPAZINE) 10 MG tablet Take 1 tablet (10 mg total) by mouth every 6 (six) hours as needed (Nausea or vomiting). 30 tablet 1   rosuvastatin (CRESTOR) 20 MG tablet Take 1 tablet (20 mg total) by mouth daily.     No current facility-administered medications for this visit.    OBJECTIVE: African-American woman examined in the infusion area  There were no vitals filed for this visit.  For vitals associated with the 09/02/2021 visit please see infusion area flowsheet     There is no height or weight on file to calculate BMI.   Wt Readings from Last 3 Encounters:  09/02/21 196 lb 4 oz (89 kg)  08/26/21 199 lb 4 oz  (90.4 kg)  08/19/21 201 lb 12 oz (91.5 kg)     ECOG FS:1 - Symptomatic but completely ambulatory  There is no significant neuropathy by exam or review of systems   LAB RESULTS:  CMP     Component Value Date/Time   NA 140 08/26/2021 0842   K 4.0 08/26/2021 0842   CL 107 08/26/2021 0842   CO2 22 08/26/2021 0842   GLUCOSE 121 (H) 08/26/2021 0842   BUN 19 08/26/2021 0842   CREATININE 1.13 (H) 08/26/2021 0842   CALCIUM 9.6 08/26/2021 0842   PROT 6.8 08/26/2021 0842   ALBUMIN 3.8 08/26/2021 0842   AST 23 08/26/2021 0842   ALT 30 08/26/2021 0842   ALKPHOS 95 08/26/2021 0842   BILITOT 0.3 08/26/2021 0842   GFRNONAA 56 (L) 08/26/2021 0842   GFRAA 86 04/18/2008 1652    No results found for: TOTALPROTELP, ALBUMINELP, A1GS, A2GS, BETS, BETA2SER, GAMS, MSPIKE, SPEI  No results found for: KPAFRELGTCHN, LAMBDASER, KAPLAMBRATIO  Lab Results  Component Value Date   WBC 6.6 09/02/2021   NEUTROABS 4.9 09/02/2021   HGB 9.8 (L) 09/02/2021   HCT 28.2 (L) 09/02/2021   MCV 91.3 09/02/2021   PLT 242 09/02/2021      Chemistry      Component Value Date/Time   NA 140 08/26/2021 0842   K 4.0 08/26/2021 0842   CL 107 08/26/2021 0842   CO2 22 08/26/2021 0842   BUN 19 08/26/2021 0842   CREATININE 1.13 (H) 08/26/2021 0842      Component Value Date/Time   CALCIUM 9.6 08/26/2021 0842   ALKPHOS 95 08/26/2021 0842   AST 23 08/26/2021 0842   ALT 30 08/26/2021 0842   BILITOT 0.3 08/26/2021 0842       No results found for: LABCA2  No components found for: [id number]  No results for input(s): INR in the last 168 hours.  No results found for: LABCA2  No results found for: AOZ308  No results found for: MVH846  No results found for: NGE952  No results found for: CA2729  No components found for: HGQUANT  No results found for: CEA1 / No results found for: CEA1   No results found for: AFPTUMOR  No results found for: [place]  No results found for:  TOTALPROTELP, ALBUMINELP,  A1GS, A2GS, BETS, BETA2SER, GAMS, MSPIKE, SPEI (this displays SPEP labs)  No results found for: KPAFRELGTCHN, LAMBDASER, KAPLAMBRATIO (kappa/lambda light chains)  No results found for: HGBA, HGBA2QUANT, HGBFQUANT, HGBSQUAN (Hemoglobinopathy evaluation)   No results found for: LDH  No results found for: IRON, TIBC, IRONPCTSAT (Iron and TIBC)  No results found for: FERRITIN  Urinalysis    Component Value Date/Time   COLORURINE YELLOW 11/20/2007 0951   APPEARANCEUR [place] 11/20/2007 0951   LABSPEC > OR = 1.030 11/20/2007 0951   PHURINE 5.5 11/20/2007 [place] 11/20/2007 [place] 11/20/2007 0951   KETONESUR NEGATIVE 11/20/2007 0951   UROBILINOGEN 0.2 mg/dL 11/20/2007 0951   NITRITE Negative 11/20/2007 0951   LEUKOCYTESUR Negative 11/20/2007 0951    STUDIES: No results found.   ELIGIBLE FOR AVAILABLE RESEARCH PROTOCOL: no  ASSESSMENT: 60 y.o. [place] woman status post right breast upper inner quadrant biopsy 06/03/2021 for a clinical T2N0 invasive ductal carcinoma, grade 3, functionally triple negative, with an MIB-1 of 95%.  (1) genetics testing 06/30/2021 found no deleterious mutations in APC, ATM, AXIN2, BARD1, BMPR1A, BRCA1, BRCA2, BRIP1, CDH1, CDK4*, CDKN2A (p14ARF)*, CDKN2A (p16INK4a)*, CHEK2, CTNNA1, DICER1, EPCAM (Deletion/duplication testing only), GREM1 (promoter region deletion/duplication testing only), KIT, MEN1, MLH1, MSH2, MSH3, MSH6, MUTYH, NBN, NF1, NHTL1, PALB2, PDGFRA*, PMS2, POLD1, POLE, PTEN, RAD50, RAD51C, RAD51D, SDHB, SDHC, SDHD, SMAD4, SMARCA4. STK11, TP53, TSC1, TSC2, and VHL.  The following genes were evaluated for sequence changes only: SDHA and HOXB13 c.251G>A variant only. The result report date was 06/30/2021.  (A) a variant of uncertain significance was detected in the BRIP1 and TSC2 genes.  (2) neoadjuvant chemotherapy consisting of carboplatin paclitaxel and pembrolizumab for 4 cycles starting 06/23/2021,  to be followed by doxorubicin and cyclophosphamide and pembrolizumab for 4 cycles, with pembrolizumab to be continued to complete a year  (3) definitive surgery to follow  (4) adjuvant radiation as appropriate   PLAN: We are proceeding with treatment today.  I have asked her to alert [place] if she develops any numbness or tingling in her fingers or toes before next week which will be her last Taxol dose.  Her third Neupogen dose was not scheduled and that has been corrected.  I think what happened during her walk was that she was dehydrated.  We discussed that and I gave her information on celebrate the trails to recovery.  She knows to call for any other issue that may develop before the next visit  Total encounter time 20 minutes.Sarajane Jews C. Samanda Buske, MD 09/02/21 9:18 AM Medical Oncology and Hematology [facility] [place],  [address] Tel. [phone]    Fax. [phone]   I, Wilburn Mylar, am acting as scribe for Dr. Virgie Dad. Brynlei Klausner.  I, Lurline Del MD, have reviewed the above documentation for accuracy and completeness, and I agree with the above.   *Total Encounter Time as defined by the Centers for Medicare and Medicaid Services includes, in addition to the face-to-face time of a patient visit (documented in the note above) non-face-to-face time: obtaining and reviewing outside history, ordering and reviewing medications, tests or procedures, care coordination (communications with other health care professionals or caregivers) and documentation in the medical record.

## 2021-09-02 ENCOUNTER — Telehealth: Payer: Self-pay | Admitting: *Deleted

## 2021-09-02 ENCOUNTER — Inpatient Hospital Stay: Payer: BC Managed Care – PPO

## 2021-09-02 ENCOUNTER — Inpatient Hospital Stay (HOSPITAL_BASED_OUTPATIENT_CLINIC_OR_DEPARTMENT_OTHER): Payer: BC Managed Care – PPO | Admitting: Oncology

## 2021-09-02 ENCOUNTER — Other Ambulatory Visit: Payer: Self-pay

## 2021-09-02 ENCOUNTER — Inpatient Hospital Stay: Payer: BC Managed Care – PPO | Attending: Genetic Counselor

## 2021-09-02 VITALS — BP 116/74 | HR 101 | Temp 98.1°F | Resp 20 | Wt 196.2 lb

## 2021-09-02 DIAGNOSIS — Z8042 Family history of malignant neoplasm of prostate: Secondary | ICD-10-CM | POA: Insufficient documentation

## 2021-09-02 DIAGNOSIS — Z800 Family history of malignant neoplasm of digestive organs: Secondary | ICD-10-CM | POA: Diagnosis not present

## 2021-09-02 DIAGNOSIS — Z5111 Encounter for antineoplastic chemotherapy: Secondary | ICD-10-CM | POA: Diagnosis present

## 2021-09-02 DIAGNOSIS — G620 Drug-induced polyneuropathy: Secondary | ICD-10-CM | POA: Insufficient documentation

## 2021-09-02 DIAGNOSIS — Z809 Family history of malignant neoplasm, unspecified: Secondary | ICD-10-CM | POA: Diagnosis not present

## 2021-09-02 DIAGNOSIS — Z803 Family history of malignant neoplasm of breast: Secondary | ICD-10-CM | POA: Insufficient documentation

## 2021-09-02 DIAGNOSIS — C50211 Malignant neoplasm of upper-inner quadrant of right female breast: Secondary | ICD-10-CM

## 2021-09-02 DIAGNOSIS — Z171 Estrogen receptor negative status [ER-]: Secondary | ICD-10-CM

## 2021-09-02 DIAGNOSIS — T451X5A Adverse effect of antineoplastic and immunosuppressive drugs, initial encounter: Secondary | ICD-10-CM | POA: Diagnosis not present

## 2021-09-02 DIAGNOSIS — Z79899 Other long term (current) drug therapy: Secondary | ICD-10-CM | POA: Diagnosis not present

## 2021-09-02 DIAGNOSIS — Z5112 Encounter for antineoplastic immunotherapy: Secondary | ICD-10-CM | POA: Diagnosis present

## 2021-09-02 DIAGNOSIS — Z95828 Presence of other vascular implants and grafts: Secondary | ICD-10-CM

## 2021-09-02 DIAGNOSIS — Z8249 Family history of ischemic heart disease and other diseases of the circulatory system: Secondary | ICD-10-CM | POA: Insufficient documentation

## 2021-09-02 LAB — CBC WITH DIFFERENTIAL (CANCER CENTER ONLY)
Abs Immature Granulocytes: 0.04 10*3/uL (ref 0.00–0.07)
Basophils Absolute: 0 10*3/uL (ref 0.0–0.1)
Basophils Relative: 0 %
Eosinophils Absolute: 0 10*3/uL (ref 0.0–0.5)
Eosinophils Relative: 1 %
HCT: 28.2 % — ABNORMAL LOW (ref 36.0–46.0)
Hemoglobin: 9.8 g/dL — ABNORMAL LOW (ref 12.0–15.0)
Immature Granulocytes: 1 %
Lymphocytes Relative: 20 %
Lymphs Abs: 1.3 10*3/uL (ref 0.7–4.0)
MCH: 31.7 pg (ref 26.0–34.0)
MCHC: 34.8 g/dL (ref 30.0–36.0)
MCV: 91.3 fL (ref 80.0–100.0)
Monocytes Absolute: 0.2 10*3/uL (ref 0.1–1.0)
Monocytes Relative: 4 %
Neutro Abs: 4.9 10*3/uL (ref 1.7–7.7)
Neutrophils Relative %: 74 %
Platelet Count: 242 10*3/uL (ref 150–400)
RBC: 3.09 MIL/uL — ABNORMAL LOW (ref 3.87–5.11)
RDW: 18 % — ABNORMAL HIGH (ref 11.5–15.5)
WBC Count: 6.6 10*3/uL (ref 4.0–10.5)
nRBC: 0 % (ref 0.0–0.2)

## 2021-09-02 LAB — CMP (CANCER CENTER ONLY)
ALT: 33 U/L (ref 0–44)
AST: 24 U/L (ref 15–41)
Albumin: 3.8 g/dL (ref 3.5–5.0)
Alkaline Phosphatase: 81 U/L (ref 38–126)
Anion gap: 11 (ref 5–15)
BUN: 18 mg/dL (ref 6–20)
CO2: 22 mmol/L (ref 22–32)
Calcium: 9.1 mg/dL (ref 8.9–10.3)
Chloride: 104 mmol/L (ref 98–111)
Creatinine: 0.99 mg/dL (ref 0.44–1.00)
GFR, Estimated: >60 mL/min (ref >60)
Glucose, Bld: 113 mg/dL — ABNORMAL HIGH (ref 70–99)
Potassium: 3.7 mmol/L (ref 3.5–5.1)
Sodium: 137 mmol/L (ref 135–145)
Total Bilirubin: 0.5 mg/dL (ref 0.3–1.2)
Total Protein: 6.6 g/dL (ref 6.5–8.1)

## 2021-09-02 LAB — TSH: TSH: 1.747 u[IU]/mL (ref 0.308–3.960)

## 2021-09-02 MED ORDER — SODIUM CHLORIDE 0.9 % IV SOLN
10.0000 mg | Freq: Once | INTRAVENOUS | Status: AC
  Administered 2021-09-02: 10 mg via INTRAVENOUS
  Filled 2021-09-02: qty 10

## 2021-09-02 MED ORDER — FAMOTIDINE 20 MG IN NS 100 ML IVPB
20.0000 mg | Freq: Once | INTRAVENOUS | Status: AC
  Administered 2021-09-02: 20 mg via INTRAVENOUS
  Filled 2021-09-02: qty 100

## 2021-09-02 MED ORDER — DIPHENHYDRAMINE HCL 50 MG/ML IJ SOLN
25.0000 mg | Freq: Once | INTRAMUSCULAR | Status: AC
  Administered 2021-09-02: 25 mg via INTRAVENOUS
  Filled 2021-09-02: qty 1

## 2021-09-02 MED ORDER — SODIUM CHLORIDE 0.9 % IV SOLN: Freq: Once | INTRAVENOUS | Status: AC

## 2021-09-02 MED ORDER — HEPARIN SOD (PORK) LOCK FLUSH 100 UNIT/ML IV SOLN
500.0000 [IU] | Freq: Once | INTRAVENOUS | Status: AC | PRN
  Administered 2021-09-02: 500 [IU]

## 2021-09-02 MED ORDER — SODIUM CHLORIDE 0.9% FLUSH: 10.0000 mL | Freq: Once | INTRAVENOUS | Status: DC

## 2021-09-02 MED ORDER — SODIUM CHLORIDE 0.9% FLUSH
10.0000 mL | INTRAVENOUS | Status: DC | PRN
  Administered 2021-09-02: 10 mL

## 2021-09-02 MED ORDER — SODIUM CHLORIDE 0.9 % IV SOLN
80.0000 mg/m2 | Freq: Once | INTRAVENOUS | Status: AC
  Administered 2021-09-02: 168 mg via INTRAVENOUS
  Filled 2021-09-02: qty 28

## 2021-09-02 NOTE — Telephone Encounter (Signed)
Per MD ok to treat with elevated heart rate greater then 100.

## 2021-09-03 LAB — T4: T4, Total: 9.5 ug/dL (ref 4.5–12.0)

## 2021-09-07 ENCOUNTER — Telehealth: Payer: Self-pay | Admitting: *Deleted

## 2021-09-07 ENCOUNTER — Other Ambulatory Visit: Payer: Self-pay | Admitting: Oncology

## 2021-09-07 NOTE — Telephone Encounter (Signed)
Rita Davis states she is having more neuropathy in her feet, especially the left where she had broken her ankle. Fingers are having more as well- tingling feeling. Also wants to know is she should put ice on her feet prior to treatment on Wednesday?

## 2021-09-07 NOTE — Progress Notes (Signed)
Rita Davis has developed neuropathy in her feet.  I am going to omit the final Taxol dose

## 2021-09-07 NOTE — Telephone Encounter (Signed)
Dr Jana Hakim states we will cancel Wednesday's infusion. She will come for injections as scheduled. Verbalized understanding.

## 2021-09-09 ENCOUNTER — Other Ambulatory Visit: Payer: BC Managed Care – PPO

## 2021-09-09 ENCOUNTER — Ambulatory Visit: Payer: BC Managed Care – PPO

## 2021-09-09 ENCOUNTER — Encounter: Payer: Self-pay | Admitting: *Deleted

## 2021-09-10 ENCOUNTER — Other Ambulatory Visit: Payer: Self-pay

## 2021-09-10 ENCOUNTER — Inpatient Hospital Stay: Payer: BC Managed Care – PPO

## 2021-09-10 VITALS — BP 125/82 | HR 109 | Temp 98.7°F | Resp 18

## 2021-09-10 DIAGNOSIS — C50211 Malignant neoplasm of upper-inner quadrant of right female breast: Secondary | ICD-10-CM

## 2021-09-10 DIAGNOSIS — Z171 Estrogen receptor negative status [ER-]: Secondary | ICD-10-CM

## 2021-09-10 DIAGNOSIS — Z5111 Encounter for antineoplastic chemotherapy: Secondary | ICD-10-CM | POA: Diagnosis not present

## 2021-09-10 MED ORDER — FILGRASTIM-AAFI 480 MCG/0.8ML IJ SOSY
480.0000 ug | PREFILLED_SYRINGE | Freq: Once | INTRAMUSCULAR | Status: AC
  Administered 2021-09-10: 480 ug via SUBCUTANEOUS
  Filled 2021-09-10: qty 0.8

## 2021-09-11 ENCOUNTER — Inpatient Hospital Stay: Payer: BC Managed Care – PPO

## 2021-09-11 VITALS — BP 122/65 | HR 100 | Temp 98.8°F | Resp 18

## 2021-09-11 DIAGNOSIS — C50211 Malignant neoplasm of upper-inner quadrant of right female breast: Secondary | ICD-10-CM

## 2021-09-11 DIAGNOSIS — Z5111 Encounter for antineoplastic chemotherapy: Secondary | ICD-10-CM | POA: Diagnosis not present

## 2021-09-11 MED ORDER — FILGRASTIM-AAFI 480 MCG/0.8ML IJ SOSY
480.0000 ug | PREFILLED_SYRINGE | Freq: Once | INTRAMUSCULAR | Status: AC
  Administered 2021-09-11: 480 ug via SUBCUTANEOUS
  Filled 2021-09-11: qty 0.8

## 2021-09-11 NOTE — Patient Instructions (Signed)
Filgrastim, G-CSF injection °What is this medication? °FILGRASTIM, G-CSF (fil GRA stim) is a granulocyte colony-stimulating factor that stimulates the growth of neutrophils, a type of white blood cell (WBC) important in the body's fight against infection. It is used to reduce the incidence of fever and infection in patients with certain types of cancer who are receiving chemotherapy that affects the bone marrow, to stimulate blood cell production for removal of WBCs from the body prior to a bone marrow transplantation, to reduce the incidence of fever and infection in patients who have severe chronic neutropenia, and to improve survival outcomes following high-dose radiation exposure that is toxic to the bone marrow. °This medicine may be used for other purposes; ask your health care provider or pharmacist if you have questions. °COMMON BRAND NAME(S): Neupogen, Nivestym, Releuko, Zarxio °What should I tell my care team before I take this medication? °They need to know if you have any of these conditions: °kidney disease °latex allergy °ongoing radiation therapy °sickle cell disease °an unusual or allergic reaction to filgrastim, pegfilgrastim, other medicines, foods, dyes, or preservatives °pregnant or trying to get pregnant °breast-feeding °How should I use this medication? °This medicine is for injection under the skin or infusion into a vein. As an infusion into a vein, it is usually given by a health care professional in a hospital or clinic setting. If you get this medicine at home, you will be taught how to prepare and give this medicine. Refer to the Instructions for Use that come with your medication packaging. Use exactly as directed. Take your medicine at regular intervals. Do not take your medicine more often than directed. °It is important that you put your used needles and syringes in a special sharps container. Do not put them in a trash can. If you do not have a sharps container, call your pharmacist  or healthcare provider to get one. °Talk to your pediatrician regarding the use of this medicine in children. While this drug may be prescribed for children as young as 7 months for selected conditions, precautions do apply. °Overdosage: If you think you have taken too much of this medicine contact a poison control center or emergency room at once. °NOTE: This medicine is only for you. Do not share this medicine with others. °What if I miss a dose? °It is important not to miss your dose. Call your doctor or health care professional if you miss a dose. °What may interact with this medication? °This medicine may interact with the following medications: °medicines that may cause a release of neutrophils, such as lithium °This list may not describe all possible interactions. Give your health care provider a list of all the medicines, herbs, non-prescription drugs, or dietary supplements you use. Also tell them if you smoke, drink alcohol, or use illegal drugs. Some items may interact with your medicine. °What should I watch for while using this medication? °Your condition will be monitored carefully while you are receiving this medicine. °You may need blood work done while you are taking this medicine. °Talk to your health care provider about your risk of cancer. You may be more at risk for certain types of cancer if you take this medicine. °What side effects may I notice from receiving this medication? °Side effects that you should report to your doctor or health care professional as soon as possible: °allergic reactions like skin rash, itching or hives, swelling of the face, lips, or tongue °back pain °dizziness or feeling faint °fever °pain, redness, or   irritation at site where injected °pinpoint red spots on the skin °shortness of breath or breathing problems °signs and symptoms of kidney injury like trouble passing urine, change in the amount of urine, or red or dark-brown urine °stomach or side pain, or pain at  the shoulder °swelling °tiredness °unusual bleeding or bruising °Side effects that usually do not require medical attention (report to your doctor or health care professional if they continue or are bothersome): °bone pain °cough °diarrhea °hair loss °headache °muscle pain °This list may not describe all possible side effects. Call your doctor for medical advice about side effects. You may report side effects to FDA at 1-800-FDA-1088. °Where should I keep my medication? °Keep out of the reach of children. °Store in a refrigerator between 2 and 8 degrees C (36 and 46 degrees F). Do not freeze. Keep in carton to protect from light. Throw away this medicine if vials or syringes are left out of the refrigerator for more than 24 hours. Throw away any unused medicine after the expiration date. °NOTE: This sheet is a summary. It may not cover all possible information. If you have questions about this medicine, talk to your doctor, pharmacist, or health care provider. °© 2022 [employer] (2021-07-07 00:00:00) ° °

## 2021-09-12 ENCOUNTER — Inpatient Hospital Stay: Payer: BC Managed Care – PPO

## 2021-09-12 ENCOUNTER — Other Ambulatory Visit: Payer: Self-pay

## 2021-09-12 VITALS — BP 143/84 | HR 112 | Temp 98.7°F | Resp 18

## 2021-09-12 DIAGNOSIS — C50211 Malignant neoplasm of upper-inner quadrant of right female breast: Secondary | ICD-10-CM

## 2021-09-12 DIAGNOSIS — Z5111 Encounter for antineoplastic chemotherapy: Secondary | ICD-10-CM | POA: Diagnosis not present

## 2021-09-12 MED ORDER — FILGRASTIM-AAFI 480 MCG/0.8ML IJ SOSY
480.0000 ug | PREFILLED_SYRINGE | Freq: Once | INTRAMUSCULAR | Status: AC
  Administered 2021-09-12: 480 ug via SUBCUTANEOUS

## 2021-09-15 ENCOUNTER — Encounter: Payer: Self-pay | Admitting: Oncology

## 2021-09-15 MED FILL — Fosaprepitant Dimeglumine For IV Infusion 150 MG (Base Eq): INTRAVENOUS | Qty: 5 | Status: AC

## 2021-09-15 MED FILL — Dexamethasone Sodium Phosphate Inj 100 MG/10ML: INTRAMUSCULAR | Qty: 1 | Status: AC

## 2021-09-15 NOTE — Progress Notes (Signed)
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   She is scheduled to start her fifth cycle today, which will consist of cyclophosphamide and doxorubicin together with pembrolizumab.  Her echo at baseline showed an ejection fraction in the 55 to 60% range.  She tells me generally she is doing well and has a trip to [place] scheduled for December 13 through 17.  REVIEW OF SYSTEMS: [name] tells me her peripheral neuropathy is better.  Her hands are now "fine".  Her feet are "better" she notes that the neuropathy actually got a little bit worse after we discontinued the Taxol but it finally improving.  A detailed review of systems today was otherwise stable.  COVID 19 VACCINATION STATUS:  Status post Pfizer x3 as of August 2022   HISTORY OF CURRENT ILLNESS: From the original intake note:  Rita Davis herself noted a mass in her right breast.  She brought it to medical attention and her mammogram was moved up to 06/03/2021 at the breast center.  This found the breast to be density category C.  On physical exam there was a palpable firm fixed mass in the upper inner quadrant of the right breast.  On the mammogram there was an irregular mass with spiculated borders which on ultrasound measured 2.9 cm at the 1  o'clock position 10 cm from the nipple.  The right axilla was sonographically benign.  Biopsy of the right breast mass in question 06/03/2021 showed an invasive ductal carcinoma, grade 3, estrogen receptor "positive" at 5% with weak staining intensity (functionally estrogen receptor negative), progesterone receptor negative, HER2 equivocal by immunotherapy but negative by FISH, with a proliferation marker of 60%.  Cancer Staging Malignant neoplasm of upper-inner quadrant of right breast in female, estrogen receptor negative ([place]) Staging form: Breast, AJCC 8th Edition - Clinical: Stage IIB (cT2, cN0, cM0, G3, ER-, PR-, HER2-) - Signed by Chauncey Cruel, MD on 06/09/2021  The patient's subsequent history is as detailed below   PAST MEDICAL HISTORY: Past Medical History:  Diagnosis Date   Anemia    had a fibroid tumor, was anemic at that time   Anxiety    Cancer [facility])    breast cancer   Family history of breast cancer    Family history of colon cancer    Family history of prostate cancer    Hyperlipidemia    Hypertension    Pneumonia    as a child   Pre-diabetes     PAST SURGICAL HISTORY: Past Surgical History:  Procedure Laterality Date   ABDOMINAL HYSTERECTOMY     still has ovaries   BREAST BIOPSY Right 05/23/2012   BREAST CYST EXCISION Right    cyst removed     from lower back   DILATION AND CURETTAGE OF UTERUS     ORIF ANKLE FRACTURE Left 11/13/2020   Procedure: OPEN TREATMENT OF LEFT TRIMALLEOLAR ANKLE FRACTURE WITH POSTERIOR FIXATION, SYNDESMOSIS;  Surgeon: Erle Crocker, MD;  Location: [facility];  Service: Orthopedics;  Laterality: Left;  LENGTH OF SURGERY: 1.5 HOURS   PORTACATH PLACEMENT Left 06/18/2021   Procedure: PORT PLACEMENT;  Surgeon: Stark Klein, MD;  Location: [facility];  Service: General;  Laterality: Left;    FAMILY HISTORY Family History  Problem Relation Age of Onset   Hypertension Mother    Breast cancer Mother 96        declined treatment   Prostate cancer Father        metastatic, dx 63s   Colon cancer Brother 32   Cancer Maternal Great-grandmother 49       gynecologic cancer (MGM's mother)  The patient's father died at the age of 65, having had breast cancer twice.  The patient has little information on his side of the family.  The patient's mother is 74 years old as of August 2022.  She is currently under hospice care for metastatic breast cancer having refused active treatment (she was evaluated by Dr. Lindi Adie).  Also on the maternal side there is a great grandmother with ovarian cancer.  The patient had one half brother who died from colon cancer at the age of 92.  Another half brother survives.   GYNECOLOGIC HISTORY:  No LMP recorded. Patient has had a hysterectomy. Menarche: 60 years old Age at first live birth: 60 years old [place] P 2 LMP 2015/01/28 Contraceptive several years with no complications HRT no Hysterectomy?  Yes Salpingo-oophorectomy?no   SOCIAL HISTORY:  [name] retired as Psychologist, counselling in 01/28/2020.  Her husband died 03/27/2020. 60 from a cardiac arrest in the setting of renal failure.  The patient lives by herself with no pets.  Son Timmothy Sours graduated from [employer] and works for [employer] in [place].  Son Lovena Le graduated from [employer] with a business administration degree and placed in a band    ADVANCED DIRECTIVES: Not in place.  As of 06/09/2021 60 the patient has not discussed her diagnosis of breast cancer with her children.   HEALTH MAINTENANCE: Social History   Tobacco Use   Smoking status: Never   Smokeless tobacco: Never  Substance Use Topics   Alcohol use: No   Drug use: No     Colonoscopy:  PAP:  Bone density:   No Known Allergies  Current Outpatient Medications  Medication Sig Dispense Refill   amLODipine-olmesartan (AZOR) 5-40 MG tablet Take 1 tablet by mouth in the morning.     betamethasone valerate (VALISONE) 0.1 % cream Apply 1 application topically daily  as needed (skin irritation.).     bimatoprost (LATISSE) 0.03 % ophthalmic solution Place one drop on applicator and apply evenly along the skin of the upper eyelid at base of eyelashes once daily at bedtime; repeat procedure for second eye (use a clean applicator).     Calcium-Magnesium-Zinc (CAL-MAG-ZINC PO) Take 1 tablet by mouth every other day. In the morning     Cholecalciferol (VITAMIN D3) 250 MCG (10000 UT) TABS Take 10,000 Units by mouth daily.     dexamethasone (DECADRON) 4 MG tablet Take 2 tablets once a day for 3 days after carboplatin and AC chemotherapy. Take with food. 30 tablet 1   escitalopram (LEXAPRO) 10 MG tablet Take 10 mg by mouth at bedtime.     lidocaine-prilocaine (EMLA) cream Apply to affected area once 30 g 3   metFORMIN (GLUCOPHAGE) 500 MG tablet Take by mouth daily with breakfast.     Misc Natural Products (SAMBUCUS ELDERBERRY  IMMUNE PO) Take 5 mLs by mouth daily.     Misc Natural Products (SUPER GREENS) POWD Take 1 Scoop by mouth daily.     prochlorperazine (COMPAZINE) 10 MG tablet Take 1 tablet (10 mg total) by mouth every 6 (six) hours as needed (Nausea or vomiting). 30 tablet 1   rosuvastatin (CRESTOR) 20 MG tablet Take 1 tablet (20 mg total) by mouth daily.     No current facility-administered medications for this visit.   Facility-Administered Medications Ordered in Other Visits  Medication Dose Route Frequency Provider Last Rate Last Admin   cyclophosphamide (CYTOXAN) 1,260 mg in sodium chloride 0.9 % 250 mL chemo infusion  600 mg/m2 (Treatment Plan Recorded) Intravenous Once Nimsi Males, Virgie Dad, MD       DOXOrubicin (ADRIAMYCIN) chemo injection 126 mg  60 mg/m2 (Treatment Plan Recorded) Intravenous Once Kace Hartje, Virgie Dad, MD       fosaprepitant (EMEND) 150 mg in sodium chloride 0.9 % 145 mL IVPB  150 mg Intravenous Once Marly Schuld, Virgie Dad, MD 450 mL/hr at 09/16/21 1155 150 mg at 09/16/21 1155   heparin lock flush 100 unit/mL  500 Units Intracatheter Once PRN  Otto Caraway, Virgie Dad, MD       pembrolizumab [facility]) 200 mg in sodium chloride 0.9 % 50 mL chemo infusion  200 mg Intravenous Once Jareli Highland, Virgie Dad, MD       sodium chloride flush (NS) 0.9 % injection 10 mL  10 mL Intracatheter PRN Alexi Dorminey, Virgie Dad, MD        OBJECTIVE: African-American woman examined in the infusion area  There were no vitals filed for this visit.  --For vitals associated with the 09/16/2021 visit please see the infusion area flowsheet     There is no height or weight on file to calculate BMI.   Wt Readings from Last 3 Encounters:  09/16/21 201 lb (91.2 kg)  09/02/21 196 lb 4 oz (89 kg)  08/26/21 199 lb 4 oz (90.4 kg)     ECOG FS:1 - Symptomatic but completely ambulatory  Sclerae unicteric, EOMs intact Lungs no rales or rhonchi Heart regular rate and rhythm Abd soft, nontender, positive bowel sounds Neuro: nonfocal, well oriented, appropriate affect Breasts: Deferred   LAB RESULTS:  CMP     Component Value Date/Time   NA 139 09/16/2021 0942   K 3.8 09/16/2021 0942   CL 106 09/16/2021 0942   CO2 24 09/16/2021 0942   GLUCOSE 138 (H) 09/16/2021 0942   BUN 13 09/16/2021 0942   CREATININE 0.96 09/16/2021 0942   CALCIUM 9.2 09/16/2021 0942   PROT 6.8 09/16/2021 0942   ALBUMIN 3.8 09/16/2021 0942   AST 23 09/16/2021 0942   ALT 29 09/16/2021 0942   ALKPHOS 98 09/16/2021 0942   BILITOT <0.2 (L) 09/16/2021 0942   GFRNONAA >60 09/16/2021 0942   GFRAA 86 04/18/2008 1652    No results found for: TOTALPROTELP, ALBUMINELP, A1GS, A2GS, BETS, BETA2SER, GAMS, MSPIKE, SPEI  No results found for: KPAFRELGTCHN, LAMBDASER, KAPLAMBRATIO  Lab Results  Component Value Date   WBC 10.2 09/16/2021   NEUTROABS 5.7 09/16/2021   HGB 9.9 (L) 09/16/2021   HCT 30.8 (L) 09/16/2021   MCV 96.0 09/16/2021   PLT 227 09/16/2021      Chemistry      Component Value Date/Time   NA 139 09/16/2021 0942   K 3.8 09/16/2021 0942   CL 106 09/16/2021 0942   CO2 24 09/16/2021  0942   BUN 13 09/16/2021 0942  CREATININE 0.96 09/16/2021 0942      Component Value Date/Time   CALCIUM 9.2 09/16/2021 0942   ALKPHOS 98 09/16/2021 0942   AST 23 09/16/2021 0942   ALT 29 09/16/2021 0942   BILITOT <0.2 (L) 09/16/2021 0942       No results found for: LABCA2  No components found for: [id number]  No results for input(s): INR in the last 168 hours.  No results found for: LABCA2  No results found for: AJG811  No results found for: XBW620  No results found for: BTD974  No results found for: CA2729  No components found for: HGQUANT  No results found for: CEA1 / No results found for: CEA1   No results found for: AFPTUMOR  No results found for: CHROMOGRNA  No results found for: TOTALPROTELP, ALBUMINELP, A1GS, A2GS, BETS, BETA2SER, GAMS, MSPIKE, SPEI (this displays SPEP labs)  No results found for: KPAFRELGTCHN, LAMBDASER, KAPLAMBRATIO (kappa/lambda light chains)  No results found for: HGBA, HGBA2QUANT, HGBFQUANT, HGBSQUAN (Hemoglobinopathy evaluation)   No results found for: LDH  No results found for: IRON, TIBC, IRONPCTSAT (Iron and TIBC)  No results found for: FERRITIN  Urinalysis    Component Value Date/Time   COLORURINE YELLOW 11/20/2007 0951   APPEARANCEUR [place] 11/20/2007 0951   LABSPEC > OR = 1.030 11/20/2007 0951   PHURINE 5.5 11/20/2007 [place] 11/20/2007 [place] 11/20/2007 0951   KETONESUR NEGATIVE 11/20/2007 0951   UROBILINOGEN 0.2 mg/dL 11/20/2007 0951   NITRITE Negative 11/20/2007 0951   LEUKOCYTESUR Negative 11/20/2007 0951    STUDIES: No results found.   ELIGIBLE FOR AVAILABLE RESEARCH PROTOCOL: no  ASSESSMENT: 60 y.o. [place] woman status post right breast upper inner quadrant biopsy 06/03/2021 for a clinical T2N0 invasive ductal carcinoma, grade 3, functionally triple negative, with an MIB-1 of 95%.  (1) genetics testing 06/30/2021 found no deleterious mutations in APC,  ATM, AXIN2, BARD1, BMPR1A, BRCA1, BRCA2, BRIP1, CDH1, CDK4*, CDKN2A (p14ARF)*, CDKN2A (p16INK4a)*, CHEK2, CTNNA1, DICER1, EPCAM (Deletion/duplication testing only), GREM1 (promoter region deletion/duplication testing only), KIT, MEN1, MLH1, MSH2, MSH3, MSH6, MUTYH, NBN, NF1, NHTL1, PALB2, PDGFRA*, PMS2, POLD1, POLE, PTEN, RAD50, RAD51C, RAD51D, SDHB, SDHC, SDHD, SMAD4, SMARCA4. STK11, TP53, TSC1, TSC2, and VHL.  The following genes were evaluated for sequence changes only: SDHA and HOXB13 c.251G>A variant only. The result report date was 06/30/2021.  (A) a variant of uncertain significance was detected in the BRIP1 and TSC2 genes.  (2) neoadjuvant chemotherapy consisting of carboplatin paclitaxel and pembrolizumab for 4 cycles starting 06/23/2021, to be followed by doxorubicin and cyclophosphamide and pembrolizumab for 4 cycles, with pembrolizumab to be continued to complete a year  (3) definitive surgery to follow  (4) adjuvant radiation as appropriate   PLAN: Corbin will start the second portion of her neoadjuvant chemotherapy today.  She understands these are completely different drugs.  She has a good understanding of the possible toxicities side effects and complications of these agents and she knows how to take her supportive medications.  I have asked her to keep a symptom diary and I have made an appointment with [place] 1 week from now so we can review her symptoms and then make any corrections.  2 for the last 3 cycles of chemotherapy.  I think a trip to [place] is fine.  She will need to be careful regarding infections, with masking and distancing and handwashing.  In addition she is going to feel that during the trip exactly like she will  feel 8 days from now, after her first cycle.  If she finds that she is excessively tired then she will be excessively tired at that point after the second cycle..  She is aware of that and will keep an eye on those symptoms.  She requests Dr. Sonny Dandy for  continued follow-up since he also sees her mother.  Total encounter time 25 minutes.Sarajane Jews C. Tammi Boulier, MD 09/16/21 12:05 PM Medical Oncology and Hematology [facility] [place], [place] [address] Tel. [phone]    Fax. [phone]   I, Wilburn Mylar, am acting as scribe for Dr. Virgie Dad. Karron Goens.  I, Lurline Del MD, have reviewed the above documentation for accuracy and completeness, and I agree with the above.   *Total Encounter Time as defined by the Centers for Medicare and Medicaid Services includes, in addition to the face-to-face time of a patient visit (documented in the note above) non-face-to-face time: obtaining and reviewing outside history, ordering and reviewing medications, tests or procedures, care coordination (communications with other health care professionals or caregivers) and documentation in the medical record.

## 2021-09-16 ENCOUNTER — Other Ambulatory Visit: Payer: Self-pay

## 2021-09-16 ENCOUNTER — Inpatient Hospital Stay: Payer: BC Managed Care – PPO

## 2021-09-16 ENCOUNTER — Inpatient Hospital Stay (HOSPITAL_BASED_OUTPATIENT_CLINIC_OR_DEPARTMENT_OTHER): Payer: BC Managed Care – PPO | Admitting: Oncology

## 2021-09-16 VITALS — BP 119/79 | HR 95 | Temp 97.9°F | Resp 16 | Wt 201.0 lb

## 2021-09-16 DIAGNOSIS — C50211 Malignant neoplasm of upper-inner quadrant of right female breast: Secondary | ICD-10-CM

## 2021-09-16 DIAGNOSIS — Z171 Estrogen receptor negative status [ER-]: Secondary | ICD-10-CM

## 2021-09-16 DIAGNOSIS — Z5111 Encounter for antineoplastic chemotherapy: Secondary | ICD-10-CM | POA: Diagnosis not present

## 2021-09-16 DIAGNOSIS — Z95828 Presence of other vascular implants and grafts: Secondary | ICD-10-CM

## 2021-09-16 LAB — CBC WITH DIFFERENTIAL (CANCER CENTER ONLY)
Abs Immature Granulocytes: 1.19 10*3/uL — ABNORMAL HIGH (ref 0.00–0.07)
Basophils Absolute: 0.1 10*3/uL (ref 0.0–0.1)
Basophils Relative: 1 %
Eosinophils Absolute: 0.1 10*3/uL (ref 0.0–0.5)
Eosinophils Relative: 1 %
HCT: 30.8 % — ABNORMAL LOW (ref 36.0–46.0)
Hemoglobin: 9.9 g/dL — ABNORMAL LOW (ref 12.0–15.0)
Immature Granulocytes: 12 %
Lymphocytes Relative: 19 %
Lymphs Abs: 1.9 10*3/uL (ref 0.7–4.0)
MCH: 30.8 pg (ref 26.0–34.0)
MCHC: 32.1 g/dL (ref 30.0–36.0)
MCV: 96 fL (ref 80.0–100.0)
Monocytes Absolute: 1.3 10*3/uL — ABNORMAL HIGH (ref 0.1–1.0)
Monocytes Relative: 13 %
Neutro Abs: 5.7 10*3/uL (ref 1.7–7.7)
Neutrophils Relative %: 54 %
Platelet Count: 227 10*3/uL (ref 150–400)
RBC: 3.21 MIL/uL — ABNORMAL LOW (ref 3.87–5.11)
RDW: 19 % — ABNORMAL HIGH (ref 11.5–15.5)
Smear Review: NORMAL
WBC Count: 10.2 10*3/uL (ref 4.0–10.5)
nRBC: 1.2 % — ABNORMAL HIGH (ref 0.0–0.2)

## 2021-09-16 LAB — CMP (CANCER CENTER ONLY)
ALT: 29 U/L (ref 0–44)
AST: 23 U/L (ref 15–41)
Albumin: 3.8 g/dL (ref 3.5–5.0)
Alkaline Phosphatase: 98 U/L (ref 38–126)
Anion gap: 9 (ref 5–15)
BUN: 13 mg/dL (ref 6–20)
CO2: 24 mmol/L (ref 22–32)
Calcium: 9.2 mg/dL (ref 8.9–10.3)
Chloride: 106 mmol/L (ref 98–111)
Creatinine: 0.96 mg/dL (ref 0.44–1.00)
GFR, Estimated: >60 mL/min (ref >60)
Glucose, Bld: 138 mg/dL — ABNORMAL HIGH (ref 70–99)
Potassium: 3.8 mmol/L (ref 3.5–5.1)
Sodium: 139 mmol/L (ref 135–145)
Total Bilirubin: <0.2 mg/dL — ABNORMAL LOW (ref 0.3–1.2)
Total Protein: 6.8 g/dL (ref 6.5–8.1)

## 2021-09-16 LAB — TSH: TSH: 1.001 u[IU]/mL (ref 0.308–3.960)

## 2021-09-16 MED ORDER — SODIUM CHLORIDE 0.9 % IV SOLN
200.0000 mg | Freq: Once | INTRAVENOUS | Status: AC
  Administered 2021-09-16: 200 mg via INTRAVENOUS
  Filled 2021-09-16: qty 8

## 2021-09-16 MED ORDER — PALONOSETRON HCL INJECTION 0.25 MG/5ML
0.2500 mg | Freq: Once | INTRAVENOUS | Status: AC
  Administered 2021-09-16: 0.25 mg via INTRAVENOUS
  Filled 2021-09-16: qty 5

## 2021-09-16 MED ORDER — DOXORUBICIN HCL CHEMO IV INJECTION 2 MG/ML
60.0000 mg/m2 | Freq: Once | INTRAVENOUS | Status: AC
  Administered 2021-09-16: 126 mg via INTRAVENOUS
  Filled 2021-09-16: qty 63

## 2021-09-16 MED ORDER — SODIUM CHLORIDE 0.9% FLUSH
10.0000 mL | Freq: Once | INTRAVENOUS | Status: AC
  Administered 2021-09-16: 10 mL

## 2021-09-16 MED ORDER — HEPARIN SOD (PORK) LOCK FLUSH 100 UNIT/ML IV SOLN
500.0000 [IU] | Freq: Once | INTRAVENOUS | Status: AC | PRN
  Administered 2021-09-16: 500 [IU]

## 2021-09-16 MED ORDER — SODIUM CHLORIDE 0.9% FLUSH
10.0000 mL | INTRAVENOUS | Status: DC | PRN
  Administered 2021-09-16: 10 mL

## 2021-09-16 MED ORDER — SODIUM CHLORIDE 0.9 % IV SOLN
600.0000 mg/m2 | Freq: Once | INTRAVENOUS | Status: AC
  Administered 2021-09-16: 1260 mg via INTRAVENOUS
  Filled 2021-09-16: qty 63

## 2021-09-16 MED ORDER — SODIUM CHLORIDE 0.9 % IV SOLN
150.0000 mg | Freq: Once | INTRAVENOUS | Status: AC
  Administered 2021-09-16: 150 mg via INTRAVENOUS
  Filled 2021-09-16: qty 150

## 2021-09-16 MED ORDER — SODIUM CHLORIDE 0.9 % IV SOLN
10.0000 mg | Freq: Once | INTRAVENOUS | Status: AC
  Administered 2021-09-16: 10 mg via INTRAVENOUS
  Filled 2021-09-16: qty 10

## 2021-09-16 MED ORDER — SODIUM CHLORIDE 0.9 % IV SOLN: Freq: Once | INTRAVENOUS | Status: AC

## 2021-09-16 NOTE — Patient Instructions (Signed)
[facility]  Discharge Instructions: Thank you for choosing [facility] to provide your oncology and hematology care.   If you have a lab appointment with the [facility], please go directly to the [facility] and check in at the registration area.   Wear comfortable clothing and clothing appropriate for easy access to any Portacath or PICC line.   We strive to give you quality time with your provider. You may need to reschedule your appointment if you arrive late (15 or more minutes).  Arriving late affects you and other patients whose appointments are after yours.  Also, if you miss three or more appointments without notifying the office, you may be dismissed from the clinic at the provider's discretion.      For prescription refill requests, have your pharmacy contact our office and allow 72 hours for refills to be completed.    Today you received the following chemotherapy and/or immunotherapy agents Keytruda/Doxorubicin/Cytoxan       To help prevent nausea and vomiting after your treatment, we encourage you to take your nausea medication as directed.  BELOW ARE SYMPTOMS THAT SHOULD BE REPORTED IMMEDIATELY: *FEVER GREATER THAN 100.4 F (38 C) OR HIGHER *CHILLS OR SWEATING *NAUSEA AND VOMITING THAT IS NOT CONTROLLED WITH YOUR NAUSEA MEDICATION *UNUSUAL SHORTNESS OF BREATH *UNUSUAL BRUISING OR BLEEDING *URINARY PROBLEMS (pain or burning when urinating, or frequent urination) *BOWEL PROBLEMS (unusual diarrhea, constipation, pain near the anus) TENDERNESS IN MOUTH AND THROAT WITH OR WITHOUT PRESENCE OF ULCERS (sore throat, sores in mouth, or a toothache) UNUSUAL RASH, SWELLING OR PAIN  UNUSUAL VAGINAL DISCHARGE OR ITCHING   Items with * indicate a potential emergency and should be followed up as soon as possible or go to the Emergency Department if any problems should occur.  Please show the CHEMOTHERAPY ALERT CARD or IMMUNOTHERAPY ALERT  CARD at check-in to the Emergency Department and triage nurse.  Should you have questions after your visit or need to cancel or reschedule your appointment, please contact [facility]  Dept: [phone]  and follow the prompts.  Office hours are 8:00 a.m. to 4:30 p.m. Monday - Friday. Please note that voicemails left after 4:00 p.m. may not be returned until the following business day.  We are closed weekends and major holidays. You have access to a nurse at all times for urgent questions. Please call the main number to the clinic Dept: [phone] and follow the prompts.   For any non-urgent questions, you may also contact your provider using MyChart. We now offer e-Visits for anyone 90 and older to request care online for non-urgent symptoms. For details visit [email or web address].   Also download the MyChart app! Go to the app store, search "MyChart", open the app, select [facility], and log in with your MyChart username and password.  Due to Covid, a mask is required upon entering the hospital/clinic. If you do not have a mask, one will be given to you upon arrival. For doctor visits, patients may have 1 support person aged 7 or older with them. For treatment visits, patients cannot have anyone with them due to current Covid guidelines and our immunocompromised population.

## 2021-09-17 LAB — T4: T4, Total: 7.7 ug/dL (ref 4.5–12.0)

## 2021-09-18 ENCOUNTER — Telehealth: Payer: Self-pay | Admitting: Oncology

## 2021-09-18 ENCOUNTER — Inpatient Hospital Stay: Payer: BC Managed Care – PPO

## 2021-09-18 ENCOUNTER — Other Ambulatory Visit: Payer: Self-pay

## 2021-09-18 VITALS — BP 136/70 | HR 90 | Temp 98.5°F | Resp 20

## 2021-09-18 DIAGNOSIS — Z5111 Encounter for antineoplastic chemotherapy: Secondary | ICD-10-CM | POA: Diagnosis not present

## 2021-09-18 DIAGNOSIS — C50211 Malignant neoplasm of upper-inner quadrant of right female breast: Secondary | ICD-10-CM

## 2021-09-18 DIAGNOSIS — Z171 Estrogen receptor negative status [ER-]: Secondary | ICD-10-CM

## 2021-09-18 MED ORDER — PEGFILGRASTIM-CBQV 6 MG/0.6ML ~~LOC~~ SOSY
6.0000 mg | PREFILLED_SYRINGE | Freq: Once | SUBCUTANEOUS | Status: AC
  Administered 2021-09-18: 6 mg via SUBCUTANEOUS
  Filled 2021-09-18: qty 0.6

## 2021-09-18 NOTE — Telephone Encounter (Signed)
Scheduled per 11/16 los. Did not scheduled 12/05 appointments because lindsey is not here. Reached out to nurse to see how to proceed. No one got back to me.

## 2021-09-28 ENCOUNTER — Telehealth: Payer: Self-pay

## 2021-09-28 NOTE — Telephone Encounter (Signed)
Returned call to pt to discuss her neuropathy symptoms.  Pt reports she is having a stimulation treatment done to her feet today but is still concerned about the numb feelings.  I discussed this with Val and then shared with pt B vitamins could help - pt states she would be happy to try these and then let us know how she responds.

## 2021-09-28 NOTE — Telephone Encounter (Signed)
Returned call to pt to discuss

## 2021-10-05 ENCOUNTER — Telehealth: Payer: Self-pay | Admitting: *Deleted

## 2021-10-05 NOTE — Telephone Encounter (Addendum)
This RN spoke with pt today who wanted to verify if she should come in Wednesday do to having some congestion and cough with fatigue and some dizziness.  She is hoping that the " chemo doses can be changed or can we hold the treatment tomorrow ".  This RN verified appt as scheduled for Wed not tomorrow with pt verbalizing understanding " I got confused "  Per further inquiry pt denies any fevers and had mild chills.  Pt is now day 19 post 1 cycle of A/C with [place] on 09/16/2021 with undenyka support. ( She has had 4 cycles of taxol/carbo/keytruda).  This RN informed her she needs to keep appt unless her symptoms worsen - need to assess how she is doing post new therapy and then discussion about proceeding with treatment or holding can be done at that time.  This RN discussed benefit in self testing for Covid.  Per end of call pt understands date and time of pending appt as well as to call if her symptoms worsen or she develops a fever.  MD is aware of above

## 2021-10-05 NOTE — Telephone Encounter (Incomplete Revision)
This RN spoke with pt today who wanted to verify if she should come in Wednesday do to having some congestion and cough with fatigue and some dizziness.  She is hoping that the " chemo doses can be changed or can we hold the treatment tomorrow ".  This RN verified appt as scheduled for Wed not tomorrow with pt verbalizing understanding " I got confused "  Per further inquiry pt denies any fevers and had mild chills.  Pt is now day 19 post 1 cycle of A/C with [place] on 09/16/2021 with undenyka support. ( She has had 4 cycles of taxol/carbo/keytruda).  This RN informed her she needs to keep appt unless her symptoms worsen - need to assess how she is doing post new therapy and then discussion about proceeding with treatment or holding can be done at that time.  This RN discussed benefit in self testing for Covid.  Per end of call pt understands date and time of pending appt as well as to call if her symptoms worsen or she develops a fever.  MD is aware of above

## 2021-10-06 ENCOUNTER — Telehealth: Payer: Self-pay | Admitting: Hematology and Oncology

## 2021-10-06 MED FILL — Fosaprepitant Dimeglumine For IV Infusion 150 MG (Base Eq): INTRAVENOUS | Qty: 5 | Status: AC

## 2021-10-06 MED FILL — Dexamethasone Sodium Phosphate Inj 100 MG/10ML: INTRAMUSCULAR | Qty: 1 | Status: AC

## 2021-10-06 NOTE — Telephone Encounter (Signed)
Per sch msg, patient would like to r/s 12/7 appt. Called and left msg for patient to call back if still needing to cancel appt

## 2021-10-07 ENCOUNTER — Inpatient Hospital Stay: Payer: BC Managed Care – PPO

## 2021-10-07 ENCOUNTER — Inpatient Hospital Stay: Payer: BC Managed Care – PPO | Attending: Genetic Counselor

## 2021-10-07 ENCOUNTER — Inpatient Hospital Stay: Payer: BC Managed Care – PPO | Admitting: Adult Health

## 2021-10-07 ENCOUNTER — Encounter: Payer: Self-pay | Admitting: *Deleted

## 2021-10-07 ENCOUNTER — Encounter: Payer: Self-pay | Admitting: Adult Health

## 2021-10-07 ENCOUNTER — Other Ambulatory Visit: Payer: BC Managed Care – PPO

## 2021-10-07 ENCOUNTER — Other Ambulatory Visit: Payer: Self-pay

## 2021-10-07 VITALS — BP 156/80 | HR 100 | Temp 97.2°F | Resp 18 | Ht 68.0 in | Wt 193.3 lb

## 2021-10-07 DIAGNOSIS — Z800 Family history of malignant neoplasm of digestive organs: Secondary | ICD-10-CM | POA: Diagnosis not present

## 2021-10-07 DIAGNOSIS — R6883 Chills (without fever): Secondary | ICD-10-CM | POA: Diagnosis not present

## 2021-10-07 DIAGNOSIS — C50211 Malignant neoplasm of upper-inner quadrant of right female breast: Secondary | ICD-10-CM | POA: Diagnosis not present

## 2021-10-07 DIAGNOSIS — Z79899 Other long term (current) drug therapy: Secondary | ICD-10-CM | POA: Diagnosis not present

## 2021-10-07 DIAGNOSIS — Z5112 Encounter for antineoplastic immunotherapy: Secondary | ICD-10-CM | POA: Diagnosis not present

## 2021-10-07 DIAGNOSIS — Z171 Estrogen receptor negative status [ER-]: Secondary | ICD-10-CM | POA: Insufficient documentation

## 2021-10-07 DIAGNOSIS — R110 Nausea: Secondary | ICD-10-CM | POA: Diagnosis not present

## 2021-10-07 DIAGNOSIS — R0602 Shortness of breath: Secondary | ICD-10-CM | POA: Insufficient documentation

## 2021-10-07 DIAGNOSIS — Z8042 Family history of malignant neoplasm of prostate: Secondary | ICD-10-CM | POA: Insufficient documentation

## 2021-10-07 DIAGNOSIS — G629 Polyneuropathy, unspecified: Secondary | ICD-10-CM | POA: Diagnosis not present

## 2021-10-07 DIAGNOSIS — Z803 Family history of malignant neoplasm of breast: Secondary | ICD-10-CM | POA: Diagnosis not present

## 2021-10-07 DIAGNOSIS — R000 Tachycardia, unspecified: Secondary | ICD-10-CM | POA: Insufficient documentation

## 2021-10-07 DIAGNOSIS — Z8249 Family history of ischemic heart disease and other diseases of the circulatory system: Secondary | ICD-10-CM | POA: Insufficient documentation

## 2021-10-07 DIAGNOSIS — Z5189 Encounter for other specified aftercare: Secondary | ICD-10-CM | POA: Insufficient documentation

## 2021-10-07 DIAGNOSIS — Z5111 Encounter for antineoplastic chemotherapy: Secondary | ICD-10-CM | POA: Insufficient documentation

## 2021-10-07 DIAGNOSIS — Z809 Family history of malignant neoplasm, unspecified: Secondary | ICD-10-CM | POA: Insufficient documentation

## 2021-10-07 DIAGNOSIS — Z7952 Long term (current) use of systemic steroids: Secondary | ICD-10-CM | POA: Insufficient documentation

## 2021-10-07 DIAGNOSIS — R5383 Other fatigue: Secondary | ICD-10-CM | POA: Diagnosis not present

## 2021-10-07 DIAGNOSIS — R002 Palpitations: Secondary | ICD-10-CM | POA: Insufficient documentation

## 2021-10-07 DIAGNOSIS — Z95828 Presence of other vascular implants and grafts: Secondary | ICD-10-CM

## 2021-10-07 LAB — CBC WITH DIFFERENTIAL (CANCER CENTER ONLY)
Abs Immature Granulocytes: 0.11 10*3/uL — ABNORMAL HIGH (ref 0.00–0.07)
Basophils Absolute: 0.1 10*3/uL (ref 0.0–0.1)
Basophils Relative: 1 %
Eosinophils Absolute: 0.1 10*3/uL (ref 0.0–0.5)
Eosinophils Relative: 0 %
HCT: 28.4 % — ABNORMAL LOW (ref 36.0–46.0)
Hemoglobin: 9.6 g/dL — ABNORMAL LOW (ref 12.0–15.0)
Immature Granulocytes: 1 %
Lymphocytes Relative: 8 %
Lymphs Abs: 1.1 10*3/uL (ref 0.7–4.0)
MCH: 31.2 pg (ref 26.0–34.0)
MCHC: 33.8 g/dL (ref 30.0–36.0)
MCV: 92.2 fL (ref 80.0–100.0)
Monocytes Absolute: 1.1 10*3/uL — ABNORMAL HIGH (ref 0.1–1.0)
Monocytes Relative: 8 %
Neutro Abs: 11.4 10*3/uL — ABNORMAL HIGH (ref 1.7–7.7)
Neutrophils Relative %: 82 %
Platelet Count: 505 10*3/uL — ABNORMAL HIGH (ref 150–400)
RBC: 3.08 MIL/uL — ABNORMAL LOW (ref 3.87–5.11)
RDW: 17.2 % — ABNORMAL HIGH (ref 11.5–15.5)
WBC Count: 13.9 10*3/uL — ABNORMAL HIGH (ref 4.0–10.5)
nRBC: 0 % (ref 0.0–0.2)

## 2021-10-07 LAB — CMP (CANCER CENTER ONLY)
ALT: 25 U/L (ref 0–44)
AST: 22 U/L (ref 15–41)
Albumin: 3.6 g/dL (ref 3.5–5.0)
Alkaline Phosphatase: 83 U/L (ref 38–126)
Anion gap: 11 (ref 5–15)
BUN: 13 mg/dL (ref 6–20)
CO2: 22 mmol/L (ref 22–32)
Calcium: 9.3 mg/dL (ref 8.9–10.3)
Chloride: 105 mmol/L (ref 98–111)
Creatinine: 0.95 mg/dL (ref 0.44–1.00)
GFR, Estimated: >60 mL/min (ref >60)
Glucose, Bld: 141 mg/dL — ABNORMAL HIGH (ref 70–99)
Potassium: 3.6 mmol/L (ref 3.5–5.1)
Sodium: 138 mmol/L (ref 135–145)
Total Bilirubin: 0.3 mg/dL (ref 0.3–1.2)
Total Protein: 6.8 g/dL (ref 6.5–8.1)

## 2021-10-07 LAB — TSH: TSH: 1.318 u[IU]/mL (ref 0.308–3.960)

## 2021-10-07 MED ORDER — SODIUM CHLORIDE 0.9% FLUSH
10.0000 mL | Freq: Once | INTRAVENOUS | Status: AC
  Administered 2021-10-07: 10 mL

## 2021-10-07 NOTE — Progress Notes (Addendum)
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   She is scheduled to start her fifth cycle today, which will consist of cyclophosphamide and doxorubicin together with pembrolizumab.  Her echo at baseline showed an ejection fraction in the 55 to 60% range.  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  This neuropathy worsened over the past few weeks. She reached out to a different doctor and started neuropathy treatment.  She says this treatment involves vitamin supplements, lights, and vibrations.  This treatment cost her $7000.  She says she went into her retirement fund to be able to afford this.  She says she couldn't get in with her podiatrist.  She referred to her cancer book which talked about neuropathy.  She called in on 11/28 to report her symptoms and talked to nursing who recommended b vitamins for the neuropathy.    Rita Davis began on Doxorubicin/Cyclophosphamide and pembrolizumab on 09/16/2021.  She started feeling poorly on Saturday 09/19/2021 with fatigue, and chills.  She had some nausea, but it resolved with the prochlorperazine.  She didn't vomit.  The fatigue and chills progressively worsened and she says she had to be driven to thanksgiving.   Rita Davis has been noticing palpitations.  She says that they occur with exertion.  She says that when she is walking upstairs, cleaning the bathroom.  She says this is accompanied with shortness of breath and feeling of her heart racing.  When this happens she rests and the issue improves.    Rita Davis notes that she was well hydrated over the course of her chemotherapy and had an extensive hydration regimen which included 64 ounces in water and other nutrients in there such as green water to hydrate.      REVIEW OF SYSTEMS: Review of Systems  Constitutional:  Positive for fatigue. Negative for appetite change, chills, fever and unexpected weight change.  HENT:   Negative for hearing loss, lump/mass and trouble swallowing.   Eyes:  Negative for eye problems and icterus.  Respiratory:  Positive for shortness of breath. Negative for chest tightness and cough.   Cardiovascular:  Positive for palpitations. Negative for chest pain and leg swelling.  Gastrointestinal:  Negative for abdominal distention, abdominal pain, constipation, diarrhea, nausea and vomiting.  Endocrine: Negative for hot flashes.  Genitourinary:  Negative for difficulty urinating.   Musculoskeletal:  Negative for arthralgias.  Skin:  Negative for itching and rash.  Neurological:  Negative for dizziness, extremity weakness, headaches and numbness.  Hematological:  Negative for adenopathy. Does not bruise/bleed easily.  Psychiatric/Behavioral:  Negative for depression. The patient is not nervous/anxious.     COVID 19 VACCINATION STATUS:  Status post Pfizer x3 as of August 2022   HISTORY OF CURRENT ILLNESS: From the original intake note:  Rita Davis herself noted a mass in her right breast.  She brought it to medical attention and  her mammogram was moved up to 06/03/2021 at the breast center.  This found the breast to be density category C.  On physical exam there was a palpable firm fixed mass in the upper inner quadrant of the right  breast.  On the mammogram there was an irregular mass with spiculated borders which on ultrasound measured 2.9 cm at the 1 o'clock position 10 cm from the nipple.  The right axilla was sonographically benign.  Biopsy of the right breast mass in question 06/03/2021 showed an invasive ductal carcinoma, grade 3, estrogen receptor "positive" at 5% with weak staining intensity (functionally estrogen receptor negative), progesterone receptor negative, HER2 equivocal by immunotherapy but negative by FISH, with a proliferation marker of 95%.  Cancer Staging Malignant neoplasm of upper-inner quadrant of right breast in female, estrogen receptor negative ([place]) Staging form: Breast, AJCC 8th Edition - Clinical: Stage IIB (cT2, cN0, cM0, G3, ER-, PR-, HER2-) - Signed by Chauncey Cruel, MD on 06/09/2021  The patient's subsequent history is as detailed below   PAST MEDICAL HISTORY: Past Medical History:  Diagnosis Date   Anemia    had a fibroid tumor, was anemic at that time   Anxiety    Cancer [facility])    breast cancer   Family history of breast cancer    Family history of colon cancer    Family history of prostate cancer    Hyperlipidemia    Hypertension    Pneumonia    as a child   Pre-diabetes     PAST SURGICAL HISTORY: Past Surgical History:  Procedure Laterality Date   ABDOMINAL HYSTERECTOMY     still has ovaries   BREAST BIOPSY Right 05/23/2012   BREAST CYST EXCISION Right    cyst removed     from lower back   DILATION AND CURETTAGE OF UTERUS     ORIF ANKLE FRACTURE Left 11/13/2020   Procedure: OPEN TREATMENT OF LEFT TRIMALLEOLAR ANKLE FRACTURE WITH POSTERIOR FIXATION, SYNDESMOSIS;  Surgeon: Erle Crocker, MD;  Location: [facility];  Service: Orthopedics;  Laterality: Left;  LENGTH OF SURGERY: 1.5 HOURS   PORTACATH PLACEMENT Left 06/18/2021   Procedure: PORT PLACEMENT;  Surgeon: Stark Klein, MD;  Location: [facility];  Service: General;  Laterality: Left;     FAMILY HISTORY Family History  Problem Relation Age of Onset   Hypertension Mother    Breast cancer Mother 23       declined treatment   Prostate cancer Father        metastatic, dx 33s   Colon cancer Brother 40   Cancer Maternal Great-grandmother 55       gynecologic cancer (MGM's mother)  The patient's father died at the age of 4, having had breast cancer twice.  The patient has little information on his side of the family.  The patient's mother is 91 years old as of August 2022.  She is currently under hospice care for metastatic breast cancer having refused active treatment (she was evaluated by Dr. Lindi Adie).  Also on the maternal side there is a great grandmother with ovarian cancer.  The patient had one half brother who died from colon cancer at the age of 49.  Another half brother survives.   GYNECOLOGIC HISTORY:  No LMP recorded. Patient has had a hysterectomy. Menarche: 60 years old Age at first live birth: 60 years old [place] Hevia P 2 LMP 2016 Contraceptive several years with no complications HRT no Hysterectomy?  Yes Salpingo-oophorectomy?no   SOCIAL HISTORY:  Rita Davis retired as Psychologist, counselling in 01/07/20.  Her husband died 06-Mar-2020 from a cardiac arrest in the setting of renal failure.  The patient lives by herself with no pets.  Son Timmothy Sours graduated from [employer] and works for [employer] in [place].  Son Lovena Le graduated from [employer] with a business administration degree and placed in a band    ADVANCED DIRECTIVES: Not in place.  As of 06/09/2021 the patient has not discussed her diagnosis of breast cancer with her children.   HEALTH MAINTENANCE: Social History   Tobacco Use   Smoking status: Never   Smokeless tobacco: Never  Substance Use Topics   Alcohol use: No   Drug use: No     Colonoscopy:  PAP:  Bone density:   No Known Allergies  Current Outpatient Medications  Medication Sig Dispense Refill   amLODipine-olmesartan (AZOR) 5-40 MG  tablet Take 1 tablet by mouth in the morning.     betamethasone valerate (VALISONE) 0.1 % cream Apply 1 application topically daily as needed (skin irritation.).     bimatoprost (LATISSE) 0.03 % ophthalmic solution Place one drop on applicator and apply evenly along the skin of the upper eyelid at base of eyelashes once daily at bedtime; repeat procedure for second eye (use a clean applicator).     Calcium-Magnesium-Zinc (CAL-MAG-ZINC PO) Take 1 tablet by mouth every other day. In the morning     Cholecalciferol (VITAMIN D3) 250 MCG (10000 UT) TABS Take 10,000 Units by mouth daily.     dexamethasone (DECADRON) 4 MG tablet Take 2 tablets once a day for 3 days after carboplatin and AC chemotherapy. Take with food. 30 tablet 1   escitalopram (LEXAPRO) 10 MG tablet Take 10 mg by mouth at bedtime.     lidocaine-prilocaine (EMLA) cream Apply to affected area once 30 g 3   metFORMIN (GLUCOPHAGE) 500 MG tablet Take by mouth daily with breakfast.     Misc Natural Products (SAMBUCUS ELDERBERRY IMMUNE PO) Take 5 mLs by mouth daily.     Misc Natural Products (SUPER GREENS) POWD Take 1 Scoop by mouth daily.     prochlorperazine (COMPAZINE) 10 MG tablet Take 1 tablet (10 mg total) by mouth every 6 (six) hours as needed (Nausea or vomiting). 30 tablet 1   rosuvastatin (CRESTOR) 20 MG tablet Take 1 tablet (20 mg total) by mouth daily.     No current facility-administered medications for this visit.    OBJECTIVE: African-American woman examined in the infusion area  Vitals:   10/07/21 0838  BP: (!) 156/80  Pulse: 100  Resp: 18  Temp: (!) 97.2 F (36.2 C)  SpO2: 100%        Body mass index is 29.39 kg/m.   Wt Readings from Last 3 Encounters:  10/07/21 193 lb 4.8 oz (87.7 kg)  09/16/21 201 lb (91.2 kg)  09/02/21 196 lb 4 oz (89 kg)     ECOG FS:1 - Symptomatic but completely ambulatory GENERAL: Patient is a well appearing female in no acute distress HEENT:  Sclerae anicteric.  Oropharynx clear and  moist. No ulcerations or evidence of oropharyngeal candidiasis. Neck is supple.  NODES:  No cervical, supraclavicular, or axillary lymphadenopathy palpated.  BREAST EXAM:  unable to palpate right bresat mass LUNGS:  Clear to auscultation bilaterally.  No wheezes or rhonchi. HEART:  Regular rate and rhythm. No murmur appreciated. ABDOMEN:  Soft, nontender.  Positive, normoactive bowel sounds. No organomegaly palpated. MSK:  No focal spinal tenderness to palpation.  Full range of motion bilaterally in the upper extremities. EXTREMITIES:  No peripheral edema.   SKIN:  Clear with no obvious rashes or skin changes. No nail dyscrasia. NEURO:  Nonfocal. Well oriented.  Appropriate affect.   LAB RESULTS:  CMP     Component Value Date/Time   NA 138 10/07/2021 0816   K 3.6 10/07/2021 0816   CL 105 10/07/2021 0816   CO2 22 10/07/2021 0816   GLUCOSE 141 (H) 10/07/2021 0816   BUN 13 10/07/2021 0816   CREATININE 0.95 10/07/2021 0816   CALCIUM 9.3 10/07/2021 0816   PROT 6.8 10/07/2021 0816   ALBUMIN 3.6 10/07/2021 0816   AST 22 10/07/2021 0816   ALT 25 10/07/2021 0816   ALKPHOS 83 10/07/2021 0816   BILITOT 0.3 10/07/2021 0816   GFRNONAA >60 10/07/2021 0816   GFRAA 86 04/18/2008 1652    No results found for: TOTALPROTELP, ALBUMINELP, A1GS, A2GS, BETS, BETA2SER, GAMS, MSPIKE, SPEI  No results found for: KPAFRELGTCHN, LAMBDASER, KAPLAMBRATIO  Lab Results  Component Value Date   WBC 13.9 (H) 10/07/2021   NEUTROABS 11.4 (H) 10/07/2021   HGB 9.6 (L) 10/07/2021   HCT 28.4 (L) 10/07/2021   MCV 92.2 10/07/2021   PLT 505 (H) 10/07/2021      Chemistry      Component Value Date/Time   NA 138 10/07/2021 0816   K 3.6 10/07/2021 0816   CL 105 10/07/2021 0816   CO2 22 10/07/2021 0816   BUN 13 10/07/2021 0816   CREATININE 0.95 10/07/2021 0816      Component Value Date/Time   CALCIUM 9.3 10/07/2021 0816   ALKPHOS 83 10/07/2021 0816   AST 22 10/07/2021 0816   ALT 25 10/07/2021 0816    BILITOT 0.3 10/07/2021 0816       No results found for: LABCA2  No components found for: GPQDIY641  No results for input(s): INR in the last 168 hours.  No results found for: LABCA2  No results found for: RAX094  No results found for: MHW808  No results found for: UPJ031  No results found for: CA2729  No components found for: HGQUANT  No results found for: CEA1 / No results found for: CEA1   No results found for: AFPTUMOR  No results found for: CHROMOGRNA  No results found for: TOTALPROTELP, ALBUMINELP, A1GS, A2GS, BETS, BETA2SER, GAMS, MSPIKE, SPEI (this displays SPEP labs)  No results found for: KPAFRELGTCHN, LAMBDASER, KAPLAMBRATIO (kappa/lambda light chains)  No results found for: HGBA, HGBA2QUANT, HGBFQUANT, HGBSQUAN (Hemoglobinopathy evaluation)   No results found for: LDH  No results found for: IRON, TIBC, IRONPCTSAT (Iron and TIBC)  No results found for: FERRITIN  Urinalysis    Component Value Date/Time   COLORURINE YELLOW 11/20/2007 0951   APPEARANCEUR [place] 11/20/2007 0951   LABSPEC > OR = 1.030 11/20/2007 0951   PHURINE 5.5 11/20/2007 [place] 11/20/2007 [place] 11/20/2007 0951   KETONESUR NEGATIVE 11/20/2007 0951   UROBILINOGEN 0.2 mg/dL 11/20/2007 0951   NITRITE Negative 11/20/2007 0951   LEUKOCYTESUR Negative 11/20/2007 0951    STUDIES: No results found.   ELIGIBLE FOR AVAILABLE RESEARCH PROTOCOL: no  ASSESSMENT: 60 y.o. [place] woman status post right breast upper inner quadrant biopsy 06/03/2021 for a clinical T2N0 invasive ductal carcinoma, grade 3, functionally triple negative, with an MIB-1 of 95%.  (1) genetics testing 06/30/2021 found no deleterious mutations in APC, ATM, AXIN2, BARD1, BMPR1A, BRCA1, BRCA2, BRIP1, CDH1, CDK4*, CDKN2A (p14ARF)*, CDKN2A (p16INK4a)*, CHEK2,  CTNNA1, DICER1, EPCAM (Deletion/duplication testing only), GREM1 (promoter region deletion/duplication testing only),  KIT, MEN1, MLH1, MSH2, MSH3, MSH6, MUTYH, NBN, NF1, NHTL1, PALB2, PDGFRA*, PMS2, POLD1, POLE, PTEN, RAD50, RAD51C, RAD51D, SDHB, SDHC, SDHD, SMAD4, SMARCA4. STK11, TP53, TSC1, TSC2, and VHL.  The following genes were evaluated for sequence changes only: SDHA and HOXB13 c.251G>A variant only. The result report date was 06/30/2021.  (A) a variant of uncertain significance was detected in the BRIP1 and TSC2 genes.  (2) neoadjuvant chemotherapy consisting of carboplatin paclitaxel and pembrolizumab for 4 cycles starting 06/23/2021, to be followed by doxorubicin and cyclophosphamide and pembrolizumab for 4 cycles, with pembrolizumab to be continued to complete a year  (3) definitive surgery to follow  (4) adjuvant radiation as appropriate   PLAN: Rita Davis has struggled with her initial dose of Doxorubicin and Cyclophosphamide.   Her EKG shows sinus tachycardia, but no other significant change.  She met with Dr. Jana Hakim today who reviewed this with her and attributed this to anemia.  Her hemoglobin is 9.6 today.  After reviewing her vital signs and EKG he reviewed with Rita Davis that he is not worried about her heart, but the anemia is leading to the palpitations and shortness of breath with exertion.  He reviewed that we avoid transfusions, unless the hemoglobin worsens due to antibodies which can form from transfusions.    Rita Davis's neuropathy has improved and this is great news.    Rita Davis and I talked about her adverse effects.  She will skip today's cycle of treatment and we will see her back in 3 weeks for her next treatment.  We can no longer feel a breast cancer tumor which is an indication of response to treatment.    In 3 weeks she will return for labs follow-up with me in her next treatment in 6 weeks she will transition to Dr. Payton Mccallum.   Total encounter time 30 minutes.Wilber Bihari, NP 10/08/21 7:51 PM Medical Oncology and Hematology [facility] [place], [place] [address] Tel. [phone]    Fax. [phone]   ADDENDUM: Sharon is overwhelmed with concerns regarding her chemotherapy.  I think the symptoms she is experiencing are just due to anemia but we were not able to get her to treatment today.  I think a short break is in order and she will return in 3 weeks for her second cycle of doxorubicin and cyclophosphamide.  I think it would be important to get her through all 4 cycles.  We do have evidence of initial response clinically.  We want a complete oncologic response when she has her eventual surgery.  I personally saw this patient and performed a substantive portion of this encounter with the listed APP documented above.   Chauncey Cruel, MD Medical Oncology and Hematology [facility] [address] [place], [place] [address] Tel. [phone]    Fax. [phone]   *Total Encounter Time as defined by the Centers for Medicare and Medicaid Services includes, in addition to the face-to-face time of a patient visit (documented in the note above) non-face-to-face time: obtaining and reviewing outside history, ordering and reviewing medications, tests or procedures, care coordination (communications with other health care professionals or caregivers) and documentation in the medical record.

## 2021-10-08 ENCOUNTER — Encounter: Payer: Self-pay | Admitting: Oncology

## 2021-10-08 LAB — T4: T4, Total: 8.3 ug/dL (ref 4.5–12.0)

## 2021-10-09 ENCOUNTER — Ambulatory Visit: Payer: BC Managed Care – PPO

## 2021-10-09 ENCOUNTER — Encounter: Payer: Self-pay | Admitting: *Deleted

## 2021-10-15 ENCOUNTER — Encounter: Payer: Self-pay | Admitting: Oncology

## 2021-10-18 ENCOUNTER — Other Ambulatory Visit: Payer: Self-pay

## 2021-10-28 ENCOUNTER — Other Ambulatory Visit: Payer: Self-pay

## 2021-10-28 ENCOUNTER — Encounter: Payer: Self-pay | Admitting: Adult Health

## 2021-10-28 ENCOUNTER — Inpatient Hospital Stay: Payer: BC Managed Care – PPO

## 2021-10-28 ENCOUNTER — Inpatient Hospital Stay (HOSPITAL_BASED_OUTPATIENT_CLINIC_OR_DEPARTMENT_OTHER): Payer: BC Managed Care – PPO | Admitting: Adult Health

## 2021-10-28 ENCOUNTER — Encounter: Payer: Self-pay | Admitting: *Deleted

## 2021-10-28 VITALS — BP 145/95 | HR 102 | Temp 97.0°F | Resp 18 | Wt 187.8 lb

## 2021-10-28 DIAGNOSIS — C50211 Malignant neoplasm of upper-inner quadrant of right female breast: Secondary | ICD-10-CM

## 2021-10-28 DIAGNOSIS — Z5112 Encounter for antineoplastic immunotherapy: Secondary | ICD-10-CM | POA: Diagnosis not present

## 2021-10-28 DIAGNOSIS — Z95828 Presence of other vascular implants and grafts: Secondary | ICD-10-CM

## 2021-10-28 DIAGNOSIS — Z171 Estrogen receptor negative status [ER-]: Secondary | ICD-10-CM

## 2021-10-28 LAB — CBC WITH DIFFERENTIAL (CANCER CENTER ONLY)
Abs Immature Granulocytes: 0.03 10*3/uL (ref 0.00–0.07)
Basophils Absolute: 0 10*3/uL (ref 0.0–0.1)
Basophils Relative: 0 %
Eosinophils Absolute: 0.5 10*3/uL (ref 0.0–0.5)
Eosinophils Relative: 5 %
HCT: 31.5 % — ABNORMAL LOW (ref 36.0–46.0)
Hemoglobin: 10.1 g/dL — ABNORMAL LOW (ref 12.0–15.0)
Immature Granulocytes: 0 %
Lymphocytes Relative: 13 %
Lymphs Abs: 1.3 10*3/uL (ref 0.7–4.0)
MCH: 29.2 pg (ref 26.0–34.0)
MCHC: 32.1 g/dL (ref 30.0–36.0)
MCV: 91 fL (ref 80.0–100.0)
Monocytes Absolute: 0.6 10*3/uL (ref 0.1–1.0)
Monocytes Relative: 6 %
Neutro Abs: 7.1 10*3/uL (ref 1.7–7.7)
Neutrophils Relative %: 76 %
Platelet Count: 257 10*3/uL (ref 150–400)
RBC: 3.46 MIL/uL — ABNORMAL LOW (ref 3.87–5.11)
RDW: 15.5 % (ref 11.5–15.5)
WBC Count: 9.6 10*3/uL (ref 4.0–10.5)
nRBC: 0 % (ref 0.0–0.2)

## 2021-10-28 LAB — CMP (CANCER CENTER ONLY)
ALT: 22 U/L (ref 0–44)
AST: 21 U/L (ref 15–41)
Albumin: 3.9 g/dL (ref 3.5–5.0)
Alkaline Phosphatase: 75 U/L (ref 38–126)
Anion gap: 7 (ref 5–15)
BUN: 16 mg/dL (ref 6–20)
CO2: 27 mmol/L (ref 22–32)
Calcium: 9.5 mg/dL (ref 8.9–10.3)
Chloride: 105 mmol/L (ref 98–111)
Creatinine: 1 mg/dL (ref 0.44–1.00)
GFR, Estimated: >60 mL/min (ref >60)
Glucose, Bld: 117 mg/dL — ABNORMAL HIGH (ref 70–99)
Potassium: 3.6 mmol/L (ref 3.5–5.1)
Sodium: 139 mmol/L (ref 135–145)
Total Bilirubin: 0.3 mg/dL (ref 0.3–1.2)
Total Protein: 7.3 g/dL (ref 6.5–8.1)

## 2021-10-28 LAB — TSH: TSH: 2.219 u[IU]/mL (ref 0.308–3.960)

## 2021-10-28 MED ORDER — PALONOSETRON HCL INJECTION 0.25 MG/5ML
0.2500 mg | Freq: Once | INTRAVENOUS | Status: AC
  Administered 2021-10-28: 10:00:00 0.25 mg via INTRAVENOUS
  Filled 2021-10-28: qty 5

## 2021-10-28 MED ORDER — SODIUM CHLORIDE 0.9 % IV SOLN
200.0000 mg | Freq: Once | INTRAVENOUS | Status: AC
  Administered 2021-10-28: 10:00:00 200 mg via INTRAVENOUS
  Filled 2021-10-28: qty 8

## 2021-10-28 MED ORDER — SODIUM CHLORIDE 0.9% FLUSH
10.0000 mL | Freq: Once | INTRAVENOUS | Status: AC
  Administered 2021-10-28: 09:00:00 10 mL

## 2021-10-28 MED ORDER — SODIUM CHLORIDE 0.9 % IV SOLN
600.0000 mg/m2 | Freq: Once | INTRAVENOUS | Status: AC
  Administered 2021-10-28: 12:00:00 1260 mg via INTRAVENOUS
  Filled 2021-10-28: qty 63

## 2021-10-28 MED ORDER — SODIUM CHLORIDE 0.9% FLUSH
10.0000 mL | INTRAVENOUS | Status: DC | PRN
  Administered 2021-10-28: 12:00:00 10 mL

## 2021-10-28 MED ORDER — HEPARIN SOD (PORK) LOCK FLUSH 100 UNIT/ML IV SOLN
500.0000 [IU] | Freq: Once | INTRAVENOUS | Status: AC | PRN
  Administered 2021-10-28: 12:00:00 500 [IU]

## 2021-10-28 MED ORDER — SODIUM CHLORIDE 0.9 % IV SOLN: Freq: Once | INTRAVENOUS | Status: AC

## 2021-10-28 MED ORDER — SODIUM CHLORIDE 0.9 % IV SOLN
10.0000 mg | Freq: Once | INTRAVENOUS | Status: AC
  Administered 2021-10-28: 10:00:00 10 mg via INTRAVENOUS
  Filled 2021-10-28: qty 1
  Filled 2021-10-28: qty 10

## 2021-10-28 MED ORDER — DOXORUBICIN HCL CHEMO IV INJECTION 2 MG/ML
60.0000 mg/m2 | Freq: Once | INTRAVENOUS | Status: AC
  Administered 2021-10-28: 11:00:00 126 mg via INTRAVENOUS
  Filled 2021-10-28: qty 63

## 2021-10-28 MED ORDER — SODIUM CHLORIDE 0.9 % IV SOLN
150.0000 mg | Freq: Once | INTRAVENOUS | Status: AC
  Administered 2021-10-28: 10:00:00 150 mg via INTRAVENOUS
  Filled 2021-10-28: qty 150
  Filled 2021-10-28: qty 5

## 2021-10-28 NOTE — Patient Instructions (Signed)
[facility]  Discharge Instructions: Thank you for choosing [facility] to provide your oncology and hematology care.   If you have a lab appointment with the [facility], please go directly to the [facility] and check in at the registration area.   Wear comfortable clothing and clothing appropriate for easy access to any Portacath or PICC line.   We strive to give you quality time with your provider. You may need to reschedule your appointment if you arrive late (15 or more minutes).  Arriving late affects you and other patients whose appointments are after yours.  Also, if you miss three or more appointments without notifying the office, you may be dismissed from the clinic at the providers discretion.      For prescription refill requests, have your pharmacy contact our office and allow 72 hours for refills to be completed.    Today you received the following chemotherapy and/or immunotherapy agents Keytruda,, Adriamycin, cytoxan      To help prevent nausea and vomiting after your treatment, we encourage you to take your nausea medication as directed.  BELOW ARE SYMPTOMS THAT SHOULD BE REPORTED IMMEDIATELY: *FEVER GREATER THAN 100.4 F (38 C) OR HIGHER *CHILLS OR SWEATING *NAUSEA AND VOMITING THAT IS NOT CONTROLLED WITH YOUR NAUSEA MEDICATION *UNUSUAL SHORTNESS OF BREATH *UNUSUAL BRUISING OR BLEEDING *URINARY PROBLEMS (pain or burning when urinating, or frequent urination) *BOWEL PROBLEMS (unusual diarrhea, constipation, pain near the anus) TENDERNESS IN MOUTH AND THROAT WITH OR WITHOUT PRESENCE OF ULCERS (sore throat, sores in mouth, or a toothache) UNUSUAL RASH, SWELLING OR PAIN  UNUSUAL VAGINAL DISCHARGE OR ITCHING   Items with * indicate a potential emergency and should be followed up as soon as possible or go to the Emergency Department if any problems should occur.  Please show the CHEMOTHERAPY ALERT CARD or IMMUNOTHERAPY ALERT  CARD at check-in to the Emergency Department and triage nurse.  Should you have questions after your visit or need to cancel or reschedule your appointment, please contact [facility]  Dept: [phone]  and follow the prompts.  Office hours are 8:00 a.m. to 4:30 p.m. Monday - Friday. Please note that voicemails left after 4:00 p.m. may not be returned until the following business day.  We are closed weekends and major holidays. You have access to a nurse at all times for urgent questions. Please call the main number to the clinic Dept: [phone] and follow the prompts.   For any non-urgent questions, you may also contact your provider using MyChart. We now offer e-Visits for anyone 89 and older to request care online for non-urgent symptoms. For details visit [email or web address].   Also download the MyChart app! Go to the app store, search "MyChart", open the app, select [facility], and log in with your MyChart username and password.  Due to Covid, a mask is required upon entering the hospital/clinic. If you do not have a mask, one will be given to you upon arrival. For doctor visits, patients may have 1 support person aged 61 or older with them. For treatment visits, patients cannot have anyone with them due to current Covid guidelines and our immunocompromised population.

## 2021-10-28 NOTE — Assessment & Plan Note (Addendum)
[place] woman status post right breast upper inner quadrant biopsy 06/03/2021 for a clinical T2N0 invasive ductal carcinoma, grade 3, functionally triple negative, with an MIB-1 of 95%.  Treatment Plan (1) genetics testing 06/30/2021 negative (2) neoadjuvant chemotherapy consisting of carboplatin paclitaxel and pembrolizumab for 4 cycles starting 06/23/2021, to be followed by doxorubicin and cyclophosphamide and pembrolizumab for 4 cycles, with pembrolizumab to be continued to complete a year (3) definitive surgery to follow (4) adjuvant radiation as appropriate  ___________________________________________________________ Current Treatment: Adriamycin, Cytoxan, Keytruda Echo 06/18/2021 EF 55-60%  Toxicities:  Anemia: Stable today at 10.1. Tachycardia: This is resolved  [name] and I reviewed her overall treatment plan.  We discussed that she has 3 more cycles including today's cycle of Adriamycin Cytoxan and Keytruda.  At the end of her neoadjuvant chemotherapy she will undergo MRI and follow-up with her surgeon.  She will continue on the Keytruda every 3 weeks.  We will see [name] back in 3 weeks for labs, follow-up, her third cycle of neoadjuvant chemotherapy.

## 2021-10-28 NOTE — Progress Notes (Signed)
[facility] Follow up:    Rita Lucks, PA-C [address] [place] [address]   DIAGNOSIS:  Cancer Staging  Malignant neoplasm of upper-inner quadrant of right breast in female, estrogen receptor negative ([place]) Staging form: Breast, AJCC 8th Edition - Clinical: Stage IIB (cT2, cN0, cM0, G3, ER-, PR-, HER2-) - Signed by Chauncey Cruel, MD on 06/09/2021 Histologic grading system: 3 grade system   SUMMARY OF ONCOLOGIC HISTORY: Oncology History  Malignant neoplasm of upper-inner quadrant of right breast in female, estrogen receptor negative ([place])  06/03/2021 Initial Diagnosis   [place] woman status post right breast upper inner quadrant biopsy 06/03/2021 for a clinical T2N0 invasive ductal carcinoma, grade 3, functionally triple negative, with an MIB-1 of 95%.   06/09/2021 Cancer Staging   Staging form: Breast, AJCC 8th Edition - Clinical: Stage IIB (cT2, cN0, cM0, G3, ER-, PR-, HER2-) - Signed by Chauncey Cruel, MD on 06/09/2021 Histologic grading system: 3 grade system    06/23/2021 -  Neo-Adjuvant Chemotherapy   neoadjuvant chemotherapy consisting of carboplatin paclitaxel and pembrolizumab for 4 cycles starting 06/23/2021, to be followed by doxorubicin and cyclophosphamide and pembrolizumab for 4 cycles, with pembrolizumab to be continued to complete a year   06/30/2021 Genetic Testing   Patient has genetic testing done for personal history of breast cancer. No pathogenic variants were detected. Of note, a variant of uncertain significance was detected in the BRIP1 and TSC2 genes.  The genes analyzed include: APC, ATM, AXIN2, BARD1, BMPR1A, BRCA1, BRCA2, BRIP1, CDH1, CDK4*, CDKN2A (p14ARF)*, CDKN2A (p16INK4a)*, CHEK2, CTNNA1, DICER1, EPCAM (Deletion/duplication testing only), GREM1 (promoter region deletion/duplication testing only), KIT, MEN1, MLH1, MSH2, MSH3, MSH6, MUTYH, NBN, NF1, NHTL1, PALB2, PDGFRA*, PMS2, POLD1, POLE, PTEN, RAD50,  RAD51C, RAD51D, SDHB, SDHC, SDHD, SMAD4, SMARCA4. STK11, TP53, TSC1, TSC2, and VHL.  The following genes were evaluated for sequence changes only: SDHA and HOXB13 c.251G>A variant only.      CURRENT THERAPY: Adriamycin, Cytoxan, Keytruda  INTERVAL HISTORY: Rita Davis 60 y.o. female returns for evaluation prior to receiving her second cycle of Adriamycin Cytoxan and Keytruda.  Please note that we held her last cycle due to some fatigue and tachycardia that she was experiencing.  Since her last visit she has improved.  She even went on a trip to [place] and [place].  She denies any new issues today.  She notes that she still cannot fill the tumor in her right breast.   Patient Active Problem List   Diagnosis Date Noted   Genetic testing 07/10/2021   Port-A-Cath in place 06/24/2021   Family history of breast cancer 06/09/2021   Family history of prostate cancer 06/09/2021   Family history of colon cancer 06/09/2021   Malignant neoplasm of upper-inner quadrant of right breast in female, estrogen receptor negative ([place]) 06/09/2021   Pneumonia 03/21/2018   Fibroid tumor 03/10/2018   Adjustment insomnia 11/26/2015   Eczema 11/26/2015   Other specified abnormal findings of blood chemistry 11/26/2015   Healthcare maintenance 04/10/2014   Anxiety state 02/28/2014   OVERWEIGHT 03/27/2009   Overweight 03/27/2009   HYPERLIPIDEMIA 01/24/2009   ANEMIA 11/20/2007   HYPERTENSION 11/20/2007    has No Known Allergies.  MEDICAL HISTORY: Past Medical History:  Diagnosis Date   Anemia    had a fibroid tumor, was anemic at that time   Anxiety    Cancer [facility])    breast cancer   Family history of breast cancer    Family history  of colon cancer    Family history of prostate cancer    Hyperlipidemia    Hypertension    Pneumonia    as a child   Pre-diabetes     SURGICAL HISTORY: Past Surgical History:  Procedure Laterality Date   ABDOMINAL HYSTERECTOMY     still has ovaries    BREAST BIOPSY Right 05/23/2012   BREAST CYST EXCISION Right    cyst removed     from lower back   DILATION AND CURETTAGE OF UTERUS     ORIF ANKLE FRACTURE Left 11/13/2020   Procedure: OPEN TREATMENT OF LEFT TRIMALLEOLAR ANKLE FRACTURE WITH POSTERIOR FIXATION, SYNDESMOSIS;  Surgeon: Erle Crocker, MD;  Location: [facility];  Service: Orthopedics;  Laterality: Left;  LENGTH OF SURGERY: 1.5 HOURS   PORTACATH PLACEMENT Left 06/18/2021   Procedure: PORT PLACEMENT;  Surgeon: Stark Klein, MD;  Location: [facility];  Service: General;  Laterality: Left;    SOCIAL HISTORY: Social History   Socioeconomic History   Marital status: Married    Spouse name: Not on file   Number of children: Not on file   Years of education: Not on file   Highest education level: Not on file  Occupational History   Not on file  Tobacco Use   Smoking status: Never   Smokeless tobacco: Never  Substance and Sexual Activity   Alcohol use: No   Drug use: No   Sexual activity: Not on file  Other Topics Concern   Not on file  Social History Narrative   Not on file   Social Determinants of Health   Financial Resource Strain: Not on file  Food Insecurity: Not on file  Transportation Needs: Not on file  Physical Activity: Not on file  Stress: Not on file  Social Connections: Not on file  Intimate Partner Violence: Not on file    FAMILY HISTORY: Family History  Problem Relation Age of Onset   Hypertension Mother    Breast cancer Mother 24       declined treatment   Prostate cancer Father        metastatic, dx 23s   Colon cancer Brother 4   Cancer Maternal Great-grandmother 10       gynecologic cancer (MGM's mother)    Review of Systems  Constitutional:  Negative for appetite change, chills, fatigue, fever and unexpected weight change.  HENT:   Negative for hearing loss, lump/mass and trouble swallowing.   Eyes:  Negative for eye problems and icterus.  Respiratory:  Negative for  chest tightness, cough and shortness of breath.   Cardiovascular:  Negative for chest pain, leg swelling and palpitations.  Gastrointestinal:  Negative for abdominal distention, abdominal pain, constipation, diarrhea, nausea and vomiting.  Endocrine: Negative for hot flashes.  Genitourinary:  Negative for difficulty urinating.   Musculoskeletal:  Negative for arthralgias.  Skin:  Negative for itching and rash.  Neurological:  Negative for dizziness, extremity weakness, headaches and numbness.  Hematological:  Negative for adenopathy. Does not bruise/bleed easily.  Psychiatric/Behavioral:  Negative for depression. The patient is not nervous/anxious.      PHYSICAL EXAMINATION  ECOG PERFORMANCE STATUS: 1 - Symptomatic but completely ambulatory  Vitals:   10/28/21 0856  BP: (!) 145/95  Pulse: (!) 102  Resp: 18  Temp: (!) 97 F (36.1 C)  SpO2: 100%    Physical Exam Constitutional:      General: She is not in acute distress.    Appearance: Normal appearance. She is  not toxic-appearing.  HENT:     Head: Normocephalic and atraumatic.  Eyes:     General: No scleral icterus. Cardiovascular:     Rate and Rhythm: Normal rate and regular rhythm.     Pulses: Normal pulses.     Heart sounds: Normal heart sounds.  Pulmonary:     Effort: Pulmonary effort is normal.     Breath sounds: Normal breath sounds.     Comments: Right breast exam: Unable to palpate a mass. Abdominal:     General: Abdomen is flat. Bowel sounds are normal. There is no distension.     Palpations: Abdomen is soft.     Tenderness: There is no abdominal tenderness.  Musculoskeletal:        General: No swelling.     Cervical back: Neck supple.  Lymphadenopathy:     Cervical: No cervical adenopathy.  Skin:    General: Skin is warm and dry.     Findings: No rash.  Neurological:     General: No focal deficit present.     Mental Status: She is alert.  Psychiatric:        Mood and Affect: Mood normal.         Behavior: Behavior normal.    LABORATORY DATA:  CBC    Component Value Date/Time   WBC 9.6 10/28/2021 0825   WBC 7.5 11/20/2007 0951   RBC 3.46 (L) 10/28/2021 0825   HGB 10.1 (L) 10/28/2021 0825   HCT 31.5 (L) 10/28/2021 0825   PLT 257 10/28/2021 0825   MCV 91.0 10/28/2021 0825   MCH 29.2 10/28/2021 0825   MCHC 32.1 10/28/2021 0825   RDW 15.5 10/28/2021 0825   LYMPHSABS 1.3 10/28/2021 0825   MONOABS 0.6 10/28/2021 0825   EOSABS 0.5 10/28/2021 0825   BASOSABS 0.0 10/28/2021 0825    CMP     Component Value Date/Time   NA 138 10/07/2021 0816   K 3.6 10/07/2021 0816   CL 105 10/07/2021 0816   CO2 22 10/07/2021 0816   GLUCOSE 141 (H) 10/07/2021 0816   BUN 13 10/07/2021 0816   CREATININE 0.95 10/07/2021 0816   CALCIUM 9.3 10/07/2021 0816   PROT 6.8 10/07/2021 0816   ALBUMIN 3.6 10/07/2021 0816   AST 22 10/07/2021 0816   ALT 25 10/07/2021 0816   ALKPHOS 83 10/07/2021 0816   BILITOT 0.3 10/07/2021 0816   GFRNONAA >60 10/07/2021 0816   GFRAA 86 04/18/2008 1652         ASSESSMENT and THERAPY PLAN:   Malignant neoplasm of upper-inner quadrant of right breast in female, estrogen receptor negative ([place]) [place] woman status post right breast upper inner quadrant biopsy 06/03/2021 for a clinical T2N0 invasive ductal carcinoma, grade 3, functionally triple negative, with an MIB-1 of 95%.  Treatment Plan (1) genetics testing 06/30/2021 negative (2) neoadjuvant chemotherapy consisting of carboplatin paclitaxel and pembrolizumab for 4 cycles starting 06/23/2021, to be followed by doxorubicin and cyclophosphamide and pembrolizumab for 4 cycles, with pembrolizumab to be continued to complete a year (3) definitive surgery to follow (4) adjuvant radiation as appropriate  ___________________________________________________________ Current Treatment: Adriamycin, Cytoxan, Keytruda Echo 06/18/2021 EF 55-60%  Toxicities:  Anemia: Stable today at 10.1. Tachycardia: This is  resolved  [name] and I reviewed her overall treatment plan.  We discussed that she has 3 more cycles including today's cycle of Adriamycin Cytoxan and Keytruda.  At the end of her neoadjuvant chemotherapy she will undergo MRI and follow-up with her surgeon.  She will continue  on the Keytruda every 3 weeks.  We will see [name] back in 3 weeks for labs, follow-up, her third cycle of neoadjuvant chemotherapy.     All questions were answered. The patient knows to call the clinic with any problems, questions or concerns. We can certainly see the patient much sooner if necessary.  Total encounter time: 30 minutes in face-to-face visit time, chart review, lab review, care coordination, order entry, and documentation of the encounter.  Wilber Bihari, NP 10/28/21 9:17 AM Medical Oncology and Hematology [facility] [place], [place] [address] Tel. [phone]    Fax. [phone]  *Total Encounter Time as defined by the Centers for Medicare and Medicaid Services includes, in addition to the face-to-face time of a patient visit (documented in the note above) non-face-to-face time: obtaining and reviewing outside history, ordering and reviewing medications, tests or procedures, care coordination (communications with other health care professionals or caregivers) and documentation in the medical record.

## 2021-10-29 LAB — T4: T4, Total: 7.7 ug/dL (ref 4.5–12.0)

## 2021-10-30 ENCOUNTER — Inpatient Hospital Stay: Payer: BC Managed Care – PPO

## 2021-10-30 ENCOUNTER — Other Ambulatory Visit: Payer: Self-pay

## 2021-10-30 VITALS — BP 135/78 | HR 77 | Temp 98.3°F | Resp 20

## 2021-10-30 DIAGNOSIS — C50211 Malignant neoplasm of upper-inner quadrant of right female breast: Secondary | ICD-10-CM

## 2021-10-30 DIAGNOSIS — Z5112 Encounter for antineoplastic immunotherapy: Secondary | ICD-10-CM | POA: Diagnosis not present

## 2021-10-30 MED ORDER — PEGFILGRASTIM-CBQV 6 MG/0.6ML ~~LOC~~ SOSY
6.0000 mg | PREFILLED_SYRINGE | Freq: Once | SUBCUTANEOUS | Status: AC
  Administered 2021-10-30: 09:00:00 6 mg via SUBCUTANEOUS
  Filled 2021-10-30: qty 0.6

## 2021-11-01 DIAGNOSIS — J189 Pneumonia, unspecified organism: Secondary | ICD-10-CM

## 2021-11-01 HISTORY — DX: Pneumonia, unspecified organism: J18.9

## 2021-11-04 ENCOUNTER — Encounter (HOSPITAL_COMMUNITY): Payer: Self-pay

## 2021-11-04 ENCOUNTER — Inpatient Hospital Stay (HOSPITAL_COMMUNITY)
Admission: EM | Admit: 2021-11-04 | Discharge: 2021-11-06 | DRG: 193 | Disposition: A | Discharge status: Home / Self Care | Payer: BC Managed Care – PPO | Attending: Internal Medicine | Admitting: Internal Medicine

## 2021-11-04 ENCOUNTER — Emergency Department (HOSPITAL_COMMUNITY): Payer: BC Managed Care – PPO

## 2021-11-04 ENCOUNTER — Other Ambulatory Visit: Payer: Self-pay

## 2021-11-04 DIAGNOSIS — Z20822 Contact with and (suspected) exposure to covid-19: Secondary | ICD-10-CM | POA: Diagnosis present

## 2021-11-04 DIAGNOSIS — Z7984 Long term (current) use of oral hypoglycemic drugs: Secondary | ICD-10-CM

## 2021-11-04 DIAGNOSIS — D701 Agranulocytosis secondary to cancer chemotherapy: Secondary | ICD-10-CM | POA: Diagnosis present

## 2021-11-04 DIAGNOSIS — E785 Hyperlipidemia, unspecified: Secondary | ICD-10-CM | POA: Diagnosis present

## 2021-11-04 DIAGNOSIS — F419 Anxiety disorder, unspecified: Secondary | ICD-10-CM | POA: Diagnosis present

## 2021-11-04 DIAGNOSIS — Z8042 Family history of malignant neoplasm of prostate: Secondary | ICD-10-CM

## 2021-11-04 DIAGNOSIS — J189 Pneumonia, unspecified organism: Principal | ICD-10-CM | POA: Diagnosis present

## 2021-11-04 DIAGNOSIS — E871 Hypo-osmolality and hyponatremia: Secondary | ICD-10-CM | POA: Diagnosis present

## 2021-11-04 DIAGNOSIS — Z171 Estrogen receptor negative status [ER-]: Secondary | ICD-10-CM

## 2021-11-04 DIAGNOSIS — Z8249 Family history of ischemic heart disease and other diseases of the circulatory system: Secondary | ICD-10-CM

## 2021-11-04 DIAGNOSIS — Z79899 Other long term (current) drug therapy: Secondary | ICD-10-CM

## 2021-11-04 DIAGNOSIS — I10 Essential (primary) hypertension: Secondary | ICD-10-CM | POA: Diagnosis present

## 2021-11-04 DIAGNOSIS — Z803 Family history of malignant neoplasm of breast: Secondary | ICD-10-CM

## 2021-11-04 DIAGNOSIS — R0602 Shortness of breath: Secondary | ICD-10-CM | POA: Diagnosis not present

## 2021-11-04 DIAGNOSIS — D61810 Antineoplastic chemotherapy induced pancytopenia: Secondary | ICD-10-CM | POA: Diagnosis present

## 2021-11-04 DIAGNOSIS — E119 Type 2 diabetes mellitus without complications: Secondary | ICD-10-CM | POA: Diagnosis present

## 2021-11-04 DIAGNOSIS — T451X5A Adverse effect of antineoplastic and immunosuppressive drugs, initial encounter: Secondary | ICD-10-CM | POA: Diagnosis present

## 2021-11-04 DIAGNOSIS — Z800 Family history of malignant neoplasm of digestive organs: Secondary | ICD-10-CM

## 2021-11-04 DIAGNOSIS — E860 Dehydration: Secondary | ICD-10-CM | POA: Diagnosis present

## 2021-11-04 DIAGNOSIS — C50911 Malignant neoplasm of unspecified site of right female breast: Secondary | ICD-10-CM | POA: Diagnosis present

## 2021-11-04 LAB — COMPREHENSIVE METABOLIC PANEL
ALT: 21 U/L (ref 0–44)
AST: 15 U/L (ref 15–41)
Albumin: 3.7 g/dL (ref 3.5–5.0)
Alkaline Phosphatase: 75 U/L (ref 38–126)
Anion gap: 4 — ABNORMAL LOW (ref 5–15)
BUN: 15 mg/dL (ref 6–20)
CO2: 26 mmol/L (ref 22–32)
Calcium: 8.6 mg/dL — ABNORMAL LOW (ref 8.9–10.3)
Chloride: 101 mmol/L (ref 98–111)
Creatinine, Ser: 0.76 mg/dL (ref 0.44–1.00)
GFR, Estimated: >60 mL/min (ref >60)
Glucose, Bld: 125 mg/dL — ABNORMAL HIGH (ref 70–99)
Potassium: 3.6 mmol/L (ref 3.5–5.1)
Sodium: 131 mmol/L — ABNORMAL LOW (ref 135–145)
Total Bilirubin: 0.5 mg/dL (ref 0.3–1.2)
Total Protein: 7.1 g/dL (ref 6.5–8.1)

## 2021-11-04 LAB — CBC WITH DIFFERENTIAL/PLATELET
Abs Immature Granulocytes: 0.07 10*3/uL (ref 0.00–0.07)
Basophils Absolute: 0 10*3/uL (ref 0.0–0.1)
Basophils Relative: 0 %
Eosinophils Absolute: 0.1 10*3/uL (ref 0.0–0.5)
Eosinophils Relative: 15 %
HCT: 30.3 % — ABNORMAL LOW (ref 36.0–46.0)
Hemoglobin: 9.8 g/dL — ABNORMAL LOW (ref 12.0–15.0)
Immature Granulocytes: 8 %
Lymphocytes Relative: 50 %
Lymphs Abs: 0.5 10*3/uL — ABNORMAL LOW (ref 0.7–4.0)
MCH: 29.1 pg (ref 26.0–34.0)
MCHC: 32.3 g/dL (ref 30.0–36.0)
MCV: 89.9 fL (ref 80.0–100.0)
Monocytes Absolute: 0.1 10*3/uL (ref 0.1–1.0)
Monocytes Relative: 7 %
Neutro Abs: 0.2 10*3/uL — CL (ref 1.7–7.7)
Neutrophils Relative %: 20 %
Platelets: 86 10*3/uL — ABNORMAL LOW (ref 150–400)
RBC: 3.37 MIL/uL — ABNORMAL LOW (ref 3.87–5.11)
RDW: 15.6 % — ABNORMAL HIGH (ref 11.5–15.5)
WBC: 0.9 10*3/uL — CL (ref 4.0–10.5)
nRBC: 0 % (ref 0.0–0.2)

## 2021-11-04 LAB — RESP PANEL BY RT-PCR (FLU A&B, COVID) ARPGX2
Influenza A by PCR: NEGATIVE
Influenza B by PCR: NEGATIVE
SARS Coronavirus 2 by RT PCR: NEGATIVE

## 2021-11-04 LAB — RESPIRATORY PANEL BY PCR

## 2021-11-04 LAB — BRAIN NATRIURETIC PEPTIDE: B Natriuretic Peptide: 16.8 pg/mL (ref 0.0–100.0)

## 2021-11-04 LAB — HEMOGLOBIN A1C
Hgb A1c MFr Bld: 5.3 % (ref 4.8–5.6)
Mean Plasma Glucose: 105.41 mg/dL

## 2021-11-04 LAB — STREP PNEUMONIAE URINARY ANTIGEN: Strep Pneumo Urinary Antigen: NEGATIVE

## 2021-11-04 MED ORDER — SODIUM CHLORIDE (PF) 0.9 % IJ SOLN
INTRAMUSCULAR | Status: AC
  Filled 2021-11-04: qty 50

## 2021-11-04 MED ORDER — SODIUM CHLORIDE 0.9 % IV SOLN: 1.0000 g | Freq: Once | INTRAVENOUS | Status: DC

## 2021-11-04 MED ORDER — ONDANSETRON HCL 4 MG PO TABS: 4.0000 mg | ORAL_TABLET | Freq: Four times a day (QID) | ORAL | Status: DC | PRN

## 2021-11-04 MED ORDER — SODIUM CHLORIDE 0.9% FLUSH
10.0000 mL | INTRAVENOUS | Status: DC | PRN
  Administered 2021-11-06: 10 mL

## 2021-11-04 MED ORDER — ACETAMINOPHEN 325 MG PO TABS
650.0000 mg | ORAL_TABLET | Freq: Four times a day (QID) | ORAL | Status: DC | PRN
  Filled 2021-11-04: qty 2

## 2021-11-04 MED ORDER — SODIUM CHLORIDE 0.9 % IV BOLUS
1000.0000 mL | Freq: Once | INTRAVENOUS | Status: AC
  Administered 2021-11-04: 1000 mL via INTRAVENOUS

## 2021-11-04 MED ORDER — ROSUVASTATIN CALCIUM 20 MG PO TABS
20.0000 mg | ORAL_TABLET | Freq: Every day | ORAL | Status: DC
Start: 2021-11-04 — End: 2021-11-06
  Administered 2021-11-04 – 2021-11-06 (×3): 20 mg via ORAL
  Filled 2021-11-04 (×3): qty 1

## 2021-11-04 MED ORDER — SODIUM CHLORIDE 0.9 % IV SOLN: Freq: Once | INTRAVENOUS | Status: AC

## 2021-11-04 MED ORDER — VANCOMYCIN HCL IN DEXTROSE 1-5 GM/200ML-% IV SOLN
1000.0000 mg | Freq: Two times a day (BID) | INTRAVENOUS | Status: DC
  Administered 2021-11-04 – 2021-11-05 (×2): 1000 mg via INTRAVENOUS
  Filled 2021-11-04 (×2): qty 200

## 2021-11-04 MED ORDER — SODIUM CHLORIDE 0.9 % IV SOLN: 500.0000 mg | INTRAVENOUS | Status: DC

## 2021-11-04 MED ORDER — AMLODIPINE BESYLATE 5 MG PO TABS
5.0000 mg | ORAL_TABLET | Freq: Every day | ORAL | Status: DC
  Administered 2021-11-05 – 2021-11-06 (×2): 5 mg via ORAL
  Filled 2021-11-04 (×2): qty 1

## 2021-11-04 MED ORDER — IRBESARTAN 300 MG PO TABS
300.0000 mg | ORAL_TABLET | Freq: Every day | ORAL | Status: DC
  Administered 2021-11-05 – 2021-11-06 (×2): 300 mg via ORAL
  Filled 2021-11-04 (×2): qty 1

## 2021-11-04 MED ORDER — GUAIFENESIN ER 600 MG PO TB12
600.0000 mg | ORAL_TABLET | Freq: Two times a day (BID) | ORAL | Status: DC
  Administered 2021-11-04 – 2021-11-06 (×4): 600 mg via ORAL
  Filled 2021-11-04 (×4): qty 1

## 2021-11-04 MED ORDER — SODIUM CHLORIDE 0.9 % IV SOLN
2.0000 g | Freq: Three times a day (TID) | INTRAVENOUS | Status: DC
  Administered 2021-11-04 – 2021-11-06 (×7): 2 g via INTRAVENOUS
  Filled 2021-11-04 (×8): qty 2

## 2021-11-04 MED ORDER — VANCOMYCIN HCL IN DEXTROSE 1-5 GM/200ML-% IV SOLN
1000.0000 mg | Freq: Once | INTRAVENOUS | Status: AC
  Administered 2021-11-04: 1000 mg via INTRAVENOUS
  Filled 2021-11-04: qty 200

## 2021-11-04 MED ORDER — AMLODIPINE-OLMESARTAN 5-40 MG PO TABS
1.0000 | ORAL_TABLET | Freq: Every morning | ORAL | Status: DC
Start: 2021-11-05 — End: 2021-11-04

## 2021-11-04 MED ORDER — SODIUM CHLORIDE 0.9 % IV SOLN
2.0000 g | Freq: Once | INTRAVENOUS | Status: AC
  Administered 2021-11-04: 2 g via INTRAVENOUS
  Filled 2021-11-04: qty 2

## 2021-11-04 MED ORDER — ONDANSETRON HCL 4 MG/2ML IJ SOLN: 4.0000 mg | Freq: Four times a day (QID) | INTRAMUSCULAR | Status: DC | PRN

## 2021-11-04 MED ORDER — ACETAMINOPHEN 650 MG RE SUPP: 650.0000 mg | Freq: Four times a day (QID) | RECTAL | Status: DC | PRN

## 2021-11-04 MED ORDER — HYDRALAZINE HCL 20 MG/ML IJ SOLN: 10.0000 mg | Freq: Four times a day (QID) | INTRAMUSCULAR | Status: DC | PRN

## 2021-11-04 MED ORDER — CHLORHEXIDINE GLUCONATE CLOTH 2 % EX PADS
6.0000 | MEDICATED_PAD | Freq: Every day | CUTANEOUS | Status: DC
  Administered 2021-11-05: 6 via TOPICAL

## 2021-11-04 NOTE — ED Notes (Signed)
Pt resting in bed watching tv with lights out. Denies any needs at this time. Call bell in reach. Bed in lowest locked position

## 2021-11-04 NOTE — ED Provider Notes (Signed)
[facility] DEPT Provider Note   CSN: [id number] Arrival date & time: 11/04/21  0350     History  Chief Complaint  Patient presents with   Shortness of Breath   Cough    Rita Davis is a 61 y.o. female.  HPI     This is a 61 year old female with a history of hypertension, hyperlipidemia, triple negative breast cancer undergoing chemotherapy who presents with cough and upper respiratory symptoms.  Patient reports onset of symptoms on 1/1.  She has been using Mucinex and Tamiflu with minimal relief.  She is not noted any fevers at home but has had significant cough and shortness of breath.  No chest pain.  Patient states that cough is productive of yellow/green mucus.  Of note, currently receiving chemotherapy.  Was neutropenic last week when she had her chemotherapy infusion.  Home Medications Prior to Admission medications   Medication Sig Start Date End Date Taking? Authorizing Provider  amLODipine-olmesartan (AZOR) 5-40 MG tablet Take 1 tablet by mouth in the morning.    [provider]  betamethasone valerate (VALISONE) 0.1 % cream Apply 1 application topically daily as needed (skin irritation.). 07/31/18   [provider]  bimatoprost (LATISSE) 0.03 % ophthalmic solution Place one drop on applicator and apply evenly along the skin of the upper eyelid at base of eyelashes once daily at bedtime; repeat procedure for second eye (use a clean applicator). 04/28/21   [provider]  Calcium-Magnesium-Zinc (CAL-MAG-ZINC PO) Take 1 tablet by mouth every other day. In the morning    [provider]  Cholecalciferol (VITAMIN D3) 250 MCG (10000 UT) TABS Take 10,000 Units by mouth daily.    [provider]  dexamethasone (DECADRON) 4 MG tablet Take 2 tablets once a day for 3 days after carboplatin and AC chemotherapy. Take with food. 06/09/21   Magrinat, Virgie Dad, MD  escitalopram (LEXAPRO) 10 MG tablet Take 10 mg  by mouth at bedtime.    [provider]  lidocaine-prilocaine (EMLA) cream Apply to affected area once 06/09/21   Magrinat, Virgie Dad, MD  metFORMIN (GLUCOPHAGE) 500 MG tablet Take by mouth daily with breakfast.    [provider]  Misc Natural Products (SAMBUCUS ELDERBERRY IMMUNE PO) Take 5 mLs by mouth daily.    [provider]  Misc Natural Products (SUPER GREENS) POWD Take 1 Scoop by mouth daily.    [provider]  prochlorperazine (COMPAZINE) 10 MG tablet Take 1 tablet (10 mg total) by mouth every 6 (six) hours as needed (Nausea or vomiting). 06/09/21   Magrinat, Virgie Dad, MD  rosuvastatin (CRESTOR) 20 MG tablet Take 1 tablet (20 mg total) by mouth daily. 08/26/21   Magrinat, Virgie Dad, MD      Allergies    Patient has no known allergies.    Review of Systems   Review of Systems  Constitutional:  Negative for fever.  Respiratory:  Positive for cough and shortness of breath.   Cardiovascular:  Negative for chest pain and leg swelling.  Gastrointestinal:  Negative for abdominal pain, nausea and vomiting.  All other systems reviewed and are negative.  Physical Exam Updated Vital Signs BP (!) 144/81    Pulse (!) 117    Temp 98.2 F (36.8 C) (Oral)    Resp 19    SpO2 99%  Physical Exam Vitals and nursing note reviewed.  Constitutional:      Appearance: She is well-developed. She is not ill-appearing.  HENT:  Head: Normocephalic and atraumatic.     Mouth/Throat:     Mouth: Mucous membranes are moist.  Eyes:     Pupils: Pupils are equal, round, and reactive to light.  Cardiovascular:     Rate and Rhythm: Regular rhythm. Tachycardia present.     Heart sounds: Normal heart sounds.  Pulmonary:     Effort: Pulmonary effort is normal. No respiratory distress.     Breath sounds: No wheezing.  Chest:     Chest wall: No tenderness.  Abdominal:     General: Bowel sounds are normal.     Palpations: Abdomen is soft.  Musculoskeletal:     Cervical  back: Neck supple.     Right lower leg: No tenderness.     Left lower leg: No tenderness.  Skin:    General: Skin is warm and dry.  Neurological:     Mental Status: She is alert and oriented to person, place, and time.  Psychiatric:        Mood and Affect: Mood normal.    ED Results / Procedures / Treatments   Labs (all labs ordered are listed, but only abnormal results are displayed) Labs Reviewed  CBC WITH DIFFERENTIAL/PLATELET - Abnormal; Notable for the following components:      Result Value   WBC 0.9 (*)    RBC 3.37 (*)    Hemoglobin 9.8 (*)    HCT 30.3 (*)    RDW 15.6 (*)    Platelets 86 (*)    Neutro Abs 0.2 (*)    Lymphs Abs 0.5 (*)    All other components within normal limits  COMPREHENSIVE METABOLIC PANEL - Abnormal; Notable for the following components:   Sodium 131 (*)    Glucose, Bld 125 (*)    Calcium 8.6 (*)    Anion gap 4 (*)    All other components within normal limits  RESP PANEL BY RT-PCR (FLU A&B, COVID) ARPGX2  CULTURE, BLOOD (ROUTINE X 2)  CULTURE, BLOOD (ROUTINE X 2)    EKG EKG Interpretation  Date/Time:  Wednesday November 04 2021 04:09:13 EST Ventricular Rate:  113 PR Interval:  142 QRS Duration: 72 QT Interval:  327 QTC Calculation: 449 R Axis:   -65 Text Interpretation: Sinus tachycardia Ventricular premature complex Probable left atrial enlargement Left anterior fascicular block Abnormal R-wave progression, late transition Borderline T abnormalities, anterior leads Confirmed by Thayer Jew [phone]) on 11/04/2021 5:07:23 AM  Radiology DG Chest Portable 1 View  Result Date: 11/04/2021 CLINICAL DATA:  Cough, shortness of breath. EXAM: PORTABLE CHEST 1 VIEW COMPARISON:  06/18/2021. FINDINGS: The heart size and mediastinal contours are within normal limits. Lung volumes are low and there are strandy opacities at the lung bases. No effusion or pneumothorax. No acute osseous abnormality. A stable left chest port is noted. IMPRESSION: Low lung  volumes with strandy atelectasis or infiltrate at the lung bases. Electronically Signed   By: Brett Fairy M.D.   On: 11/04/2021 04:33    Procedures Procedures    Medications Ordered in ED Medications  sodium chloride 0.9 % bolus 1,000 mL (has no administration in time range)  ceFEPIme (MAXIPIME) 2 g in sodium chloride 0.9 % 100 mL IVPB (has no administration in time range)  vancomycin (VANCOCIN) IVPB 1000 mg/200 mL premix (has no administration in time range)    ED Course/ Medical Decision Making/ A&P Clinical Course as of 11/04/21 0541  Wed Nov 04, 2021  0536 Spoke with Dr. Burr Medico.  Agrees with admission  and.  Antibiotics.  [CH]    Clinical Course User Index [CH] Ilsa Bonello, Barbette Hair, MD                           Medical Decision Making  This patient presents to the ED for concern of cough/SOB, this involves an extensive number of treatment options, and is a complaint that carries with it a high risk of complications and morbidity.  The differential diagnosis includes PNA, viral infection, less like ACS, PE   Co morbidities that complicate the patient evaluation breast cancer   Additional history obtained from chart review/patient External records from outside source obtained and reviewed including outpatient oncology notes   Labs: I Ordered, and personally interpreted labs.  The pertinent results include: CBC notable for neutropenia with an absolute neutrophil count of 0.2 and white count of 0.9.  No significant metabolic derangements.  Otherwise labs at baseline   Imaging Studies ordered: I ordered imaging studies including CXR I independently visualized and interpreted imaging which showed streaky opacities in the lung bases I agree with the radiologist interpretation   Cardiac Monitoring: The patient was maintained on a cardiac monitor.  I personally viewed and interpreted the cardiac monitored which showed an underlying rhythm of: sinus  tachycardia   Medicines  I ordered medication including cefepime and vancomycin  for PNA/bacteremia I have reviewed the patients home medicines and have made adjustments as needed   Critical Interventions: broad spectrum antibiotics   Consultations Obtained: I requested consultation with the Dr. Burr Medico, oncology,  and discussed lab and imaging findings as well as pertinent plan - they recommend: admission   Problem List / ED Course:  Problem List Items Addressed This Visit   None Visit Diagnoses     Community acquired pneumonia, unspecified laterality    -  Primary   Relevant Medications   ceFEPIme (MAXIPIME) 2 g in sodium chloride 0.9 % 100 mL IVPB (Start on 11/04/2021  5:45 AM)   vancomycin (VANCOCIN) IVPB 1000 mg/200 mL premix (Start on 11/04/2021  5:45 AM)   Chemotherapy-induced neutropenia ([place])             Reevaluation: After the interventions noted above, I reevaluated the patient and found that they have :stayed the same   Social Determinants of Health: n/a   Dispostion: After consideration of the diagnostic results and the patients response to treatment, I feel that the patent would benefit from admission.         Final Clinical Impression(s) / ED Diagnoses Final diagnoses:  Community acquired pneumonia, unspecified laterality  Chemotherapy-induced neutropenia ([place])    Rx / DC Orders ED Discharge Orders     None         [facility] Davis, Barbette Hair, MD 11/04/21 [phone]

## 2021-11-04 NOTE — ED Notes (Signed)
Save blue tube in main lab °

## 2021-11-04 NOTE — H&P (Signed)
History and Physical    Rita Davis ZOX:[id number] DOB: Dec 28, 1960 DOA: 11/04/2021  PCP: Jenel Lucks, PA-C  Patient coming from: Home  Chief Complaint: cough and shortness of breath  HPI: Rita Davis is a 61 y.o. female with medical history significant of breast cancer, DM, HLD, HTN, anxiety. Presenting with dyspnea. She reports her symptoms began with a productive cough 3 days ago. It was productive of green sputum. She tried mucinex and theraflu, but these therapies did not help. Her cough became progressively worse to the point where she severely dyspneic when she would lie flat. She became concerned and came to the ED for help.   ED Course: CXR was suspicious for PNA. She was neutropenic. She was started on vanc/cefepime. Onco was consulted. TRH was called for admission.   Review of Systems:  Denies CP, palpitations, abdominal pain, N/V/D, fever. Review of systems is otherwise negative for all not mentioned in HPI.   PMHx Past Medical History:  Diagnosis Date   Anemia    had a fibroid tumor, was anemic at that time   Anxiety    Cancer [facility])    breast cancer   Family history of breast cancer    Family history of colon cancer    Family history of prostate cancer    Hyperlipidemia    Hypertension    Pneumonia    as a child   Pre-diabetes     PSHx Past Surgical History:  Procedure Laterality Date   ABDOMINAL HYSTERECTOMY     still has ovaries   BREAST BIOPSY Right 05/23/2012   BREAST CYST EXCISION Right    cyst removed     from lower back   DILATION AND CURETTAGE OF UTERUS     ORIF ANKLE FRACTURE Left 11/13/2020   Procedure: OPEN TREATMENT OF LEFT TRIMALLEOLAR ANKLE FRACTURE WITH POSTERIOR FIXATION, SYNDESMOSIS;  Surgeon: Erle Crocker, MD;  Location: [place];  Service: Orthopedics;  Laterality: Left;  LENGTH OF SURGERY: 1.5 HOURS   PORTACATH PLACEMENT Left 06/18/2021   Procedure: PORT PLACEMENT;  Surgeon: Stark Klein, MD;   Location: [place];  Service: General;  Laterality: Left;    SocHx  reports that she has never smoked. She has never used smokeless tobacco. She reports that she does not drink alcohol and does not use drugs.  No Known Allergies  FamHx Family History  Problem Relation Age of Onset   Hypertension Mother    Breast cancer Mother 73       declined treatment   Prostate cancer Father        metastatic, dx 91s   Colon cancer Brother 29   Cancer Maternal Great-grandmother 25       gynecologic cancer (MGM's mother)    Prior to Admission medications   Medication Sig Start Date End Date Taking? Authorizing Provider  amLODipine-olmesartan (AZOR) 5-40 MG tablet Take 1 tablet by mouth in the morning.    [provider]  betamethasone valerate (VALISONE) 0.1 % cream Apply 1 application topically daily as needed (skin irritation.). 07/31/18   [provider]  bimatoprost (LATISSE) 0.03 % ophthalmic solution Place one drop on applicator and apply evenly along the skin of the upper eyelid at base of eyelashes once daily at bedtime; repeat procedure for second eye (use a clean applicator). 04/28/21   [provider]  Calcium-Magnesium-Zinc (CAL-MAG-ZINC PO) Take 1 tablet by mouth every other day. In the morning    [provider]  Cholecalciferol (VITAMIN D3)  250 MCG (10000 UT) TABS Take 10,000 Units by mouth daily.    [provider]  dexamethasone (DECADRON) 4 MG tablet Take 2 tablets once a day for 3 days after carboplatin and AC chemotherapy. Take with food. 06/09/21   Magrinat, Virgie Dad, MD  escitalopram (LEXAPRO) 10 MG tablet Take 10 mg by mouth at bedtime.    [provider]  lidocaine-prilocaine (EMLA) cream Apply to affected area once 06/09/21   Magrinat, Virgie Dad, MD  metFORMIN (GLUCOPHAGE) 500 MG tablet Take by mouth daily with breakfast.    [provider]  Misc Natural Products (SAMBUCUS ELDERBERRY IMMUNE PO) Take 5 mLs by mouth daily.     [provider]  Misc Natural Products (SUPER GREENS) POWD Take 1 Scoop by mouth daily.    [provider]  prochlorperazine (COMPAZINE) 10 MG tablet Take 1 tablet (10 mg total) by mouth every 6 (six) hours as needed (Nausea or vomiting). 06/09/21   Magrinat, Virgie Dad, MD  rosuvastatin (CRESTOR) 20 MG tablet Take 1 tablet (20 mg total) by mouth daily. 08/26/21   Magrinat, Virgie Dad, MD    Physical Exam: Vitals:   11/04/21 0416 11/04/21 0500 11/04/21 0555 11/04/21 0600  BP: (!) 153/90 (!) 144/81 139/87 137/84  Pulse: 100 (!) 117 88 (!) 103  Resp: 19  16 (!) 22  Temp:      TempSrc:      SpO2: 100% 99% 98% 98%    General: 61 y.o. female resting in bed in NAD Eyes: PERRL, normal sclera ENMT: Nares patent w/o discharge, orophaynx clear, dentition normal, ears w/o discharge/lesions/ulcers Neck: Supple, trachea midline Cardiovascular: RRR, +S1, S2, no m/g/r, equal pulses throughout Respiratory: shoft rhonchi at bases, no w/r GI: BS+, NDNT, no masses noted, no organomegaly noted MSK: No e/c/c Neuro: A&O x 3, no focal deficits Psyc: Appropriate interaction and affect, calm/cooperative  Labs on Admission: I have personally reviewed following labs and imaging studies  CBC: Recent Labs  Lab 10/28/21 0825 11/04/21 0416  WBC 9.6 0.9*  NEUTROABS 7.1 0.2*  HGB 10.1* 9.8*  HCT 31.5* 30.3*  MCV 91.0 89.9  PLT 257 86*   Basic Metabolic Panel: Recent Labs  Lab 10/28/21 0825 11/04/21 0416  NA 139 131*  K 3.6 3.6  CL 105 101  CO2 27 26  GLUCOSE 117* 125*  BUN 16 15  CREATININE 1.00 0.76  CALCIUM 9.5 8.6*   GFR: Estimated Creatinine Clearance: 85.5 mL/min (by C-G formula based on SCr of 0.76 mg/dL). Liver Function Tests: Recent Labs  Lab 10/28/21 0825 11/04/21 0416  AST 21 15  ALT 22 21  ALKPHOS 75 75  BILITOT 0.3 0.5  PROT 7.3 7.1  ALBUMIN 3.9 3.7   No results for input(s): LIPASE, AMYLASE in the last 168 hours. No results for input(s): AMMONIA in the  last 168 hours. Coagulation Profile: No results for input(s): INR, PROTIME in the last 168 hours. Cardiac Enzymes: No results for input(s): CKTOTAL, CKMB, CKMBINDEX, TROPONINI in the last 168 hours. BNP (last 3 results) No results for input(s): PROBNP in the last 8760 hours. HbA1C: No results for input(s): HGBA1C in the last 72 hours. CBG: No results for input(s): GLUCAP in the last 168 hours. Lipid Profile: No results for input(s): CHOL, HDL, LDLCALC, TRIG, CHOLHDL, LDLDIRECT in the last 72 hours. Thyroid Function Tests: No results for input(s): TSH, T4TOTAL, FREET4, T3FREE, THYROIDAB in the last 72 hours. Anemia Panel: No results for input(s): VITAMINB12, FOLATE, FERRITIN, TIBC, IRON, RETICCTPCT  in the last 72 hours. Urine analysis:    Component Value Date/Time   COLORURINE YELLOW 11/20/2007 0951   APPEARANCEUR Sl Cloudy 11/20/2007 0951   LABSPEC > OR = 1.030 11/20/2007 0951   PHURINE 5.5 11/20/2007 0951   GLUCOSEU NEGATIVE 11/20/2007 0951   BILIRUBINUR NEGATIVE 11/20/2007 0951   KETONESUR NEGATIVE 11/20/2007 0951   UROBILINOGEN 0.2 mg/dL 11/20/2007 0951   NITRITE Negative 11/20/2007 0951   LEUKOCYTESUR Negative 11/20/2007 0951    Radiological Exams on Admission: DG Chest Portable 1 View  Result Date: 11/04/2021 CLINICAL DATA:  Cough, shortness of breath. EXAM: PORTABLE CHEST 1 VIEW COMPARISON:  06/18/2021. FINDINGS: The heart size and mediastinal contours are within normal limits. Lung volumes are low and there are strandy opacities at the lung bases. No effusion or pneumothorax. No acute osseous abnormality. A stable left chest port is noted. IMPRESSION: Low lung volumes with strandy atelectasis or infiltrate at the lung bases. Electronically Signed   By: Brett Fairy M.D.   On: 11/04/2021 04:33    EKG: Independently reviewed. Sinus tachy, no st elevations  Assessment/Plan Bilateral lower lobe PNA Neutropenia     - admit to obs, tele     - continue vanc, cefepime for  now     - COVID/flu is negative     - CXR shows b/l basilar atelectasis vs infiltrate; no pulm edema noted; no peripheral edema noted     - check RVP, sputum Cx, urine legionella/step     - IS, mucinex     - for completeness, check BNP  Hyponatremia     - mild, fluids, follow  Pancytopenia     - on chemo, likely cause     - SCDs for PPX     - onco to follow  Hx of triple negative Breast CA on chemo     - onco to follow  HTN     - resume home regimen when confirmed  HLD     - resume home regimen when confirmed  Prediabetes     - check A1c     - on metformin at home     - carb mod diet for now  Anxiety     - resume home regimen  DVT prophylaxis: SCDs  Code Status: FULL  Family Communication: None at bedside  Consults called: EDP spoke with Onco (Dr. Burr Medico)   Status is: Observation  The patient remains OBS appropriate and will d/c before 2 midnights.  Jonnie Finner DO [facility]  If 7PM-7AM, please contact night-coverage [email or web address]  11/04/2021, 7:05 AM

## 2021-11-04 NOTE — ED Notes (Signed)
Pt ambulated to bathroom without staff assistance

## 2021-11-04 NOTE — Progress Notes (Signed)
Rita Davis   DOB:[birth year]   [id number]   UEA#:[id number]  Oncology follow up note   Subjective: Patient is known to our service, previously under Dr. Virgie Dad care (he recently retired) for triple negative breast cancer, she is on neoadjuvant chemotherapy.  She presented to the emergency room with productive cough today, no fever.  She participate a service 3 days ago.  She was found to be most neutropenic in the ED.  Her x-ray in the ED showed pneumonia.  She has been admitted for further management.   Objective:  Vitals:   11/04/21 1700 11/04/21 1730  BP: (!) 142/88 (!) 143/84  Pulse: 89 87  Resp: 19 20  Temp:  98.1 F (36.7 C)  SpO2: 100% 96%    There is no height or weight on file to calculate BMI.  Intake/Output Summary (Last 24 hours) at 11/04/2021 1810 Last data filed at 11/04/2021 0701 Gross per 24 hour  Intake 1100 ml  Output --  Net 1100 ml     Sclerae unicteric  Oropharynx clear  No peripheral adenopathy  Lungs clear -- no rales or rhonchi  Heart regular rate and rhythm  Abdomen benign  MSK no focal spinal tenderness, no peripheral edema  Neuro nonfocal    CBG (last 3)  No results for input(s): GLUCAP in the last 72 hours.   Labs:   Urine Studies No results for input(s): UHGB, CRYS in the last 72 hours.  Invalid input(s): UACOL, UAPR, USPG, UPH, UTP, UGL, UKET, UBIL, UNIT, UROB, ULEU, UEPI, UWBC, URBC, UBAC, CAST, UCOM, BILUA  Basic Metabolic Panel: Recent Labs  Lab 11/04/21 0416  NA 131*  K 3.6  CL 101  CO2 26  GLUCOSE 125*  BUN 15  CREATININE 0.76  CALCIUM 8.6*   GFR Estimated Creatinine Clearance: 85.5 mL/min (by C-G formula based on SCr of 0.76 mg/dL). Liver Function Tests: Recent Labs  Lab 11/04/21 0416  AST 15  ALT 21  ALKPHOS 75  BILITOT 0.5  PROT 7.1  ALBUMIN 3.7   No results for input(s): LIPASE, AMYLASE in the last 168 hours. No results for input(s): AMMONIA in the last 168 hours. Coagulation profile No results for  input(s): INR, PROTIME in the last 168 hours.  CBC: Recent Labs  Lab 11/04/21 0416  WBC 0.9*  NEUTROABS 0.2*  HGB 9.8*  HCT 30.3*  MCV 89.9  PLT 86*   Cardiac Enzymes: No results for input(s): CKTOTAL, CKMB, CKMBINDEX, TROPONINI in the last 168 hours. BNP: Invalid input(s): POCBNP CBG: No results for input(s): GLUCAP in the last 168 hours. D-Dimer No results for input(s): DDIMER in the last 72 hours. Hgb A1c Recent Labs    11/04/21 0919  HGBA1C 5.3   Lipid Profile No results for input(s): CHOL, HDL, LDLCALC, TRIG, CHOLHDL, LDLDIRECT in the last 72 hours. Thyroid function studies No results for input(s): TSH, T4TOTAL, T3FREE, THYROIDAB in the last 72 hours.  Invalid input(s): FREET3 Anemia work up No results for input(s): VITAMINB12, FOLATE, FERRITIN, TIBC, IRON, RETICCTPCT in the last 72 hours. Microbiology Recent Results (from the past 240 hour(s))  Resp Panel by RT-PCR (Flu A&B, Covid) Nasopharyngeal Swab     Status: None   Collection Time: 11/04/21  4:07 AM   Specimen: Nasopharyngeal Swab; Nasopharyngeal(NP) swabs in vial transport medium  Result Value Ref Range Status   SARS Coronavirus 2 by RT PCR NEGATIVE NEGATIVE Final    Comment: (NOTE) SARS-CoV-2 target nucleic acids are NOT DETECTED.  The SARS-CoV-2 RNA is generally  detectable in upper respiratory specimens during the acute phase of infection. The lowest concentration of SARS-CoV-2 viral copies this assay can detect is 138 copies/mL. A negative result does not preclude SARS-Cov-2 infection and should not be used as the sole basis for treatment or other patient management decisions. A negative result may occur with  improper specimen collection/handling, submission of specimen other than nasopharyngeal swab, presence of viral mutation(s) within the areas targeted by this assay, and inadequate number of viral copies(<138 copies/mL). A negative result must be combined with clinical observations, patient  history, and epidemiological information. The expected result is Negative.  Fact Sheet for Patients:  EntrepreneurPulse.com.au  Fact Sheet for Healthcare Providers:  IncredibleEmployment.be  This test is no t yet approved or cleared by the [place] FDA and  has been authorized for detection and/or diagnosis of SARS-CoV-2 by FDA under an Emergency Use Authorization (EUA). This EUA will remain  in effect (meaning this test can be used) for the duration of the COVID-19 declaration under Section 564(b)(1) of the Act, 21 U.S.C.section 360bbb-3(b)(1), unless the authorization is terminated  or revoked sooner.       Influenza A by PCR NEGATIVE NEGATIVE Final   Influenza B by PCR NEGATIVE NEGATIVE Final    Comment: (NOTE) The Xpert Xpress SARS-CoV-2/FLU/RSV plus assay is intended as an aid in the diagnosis of influenza from Nasopharyngeal swab specimens and should not be used as a sole basis for treatment. Nasal washings and aspirates are unacceptable for Xpert Xpress SARS-CoV-2/FLU/RSV testing.  Fact Sheet for Patients: EntrepreneurPulse.com.au  Fact Sheet for Healthcare Providers: IncredibleEmployment.be  This test is not yet approved or cleared by the [place] FDA and has been authorized for detection and/or diagnosis of SARS-CoV-2 by FDA under an Emergency Use Authorization (EUA). This EUA will remain in effect (meaning this test can be used) for the duration of the COVID-19 declaration under Section 564(b)(1) of the Act, 21 U.S.C. section 360bbb-3(b)(1), unless the authorization is terminated or revoked.  Performed at [facility], [place] [address], [place], [place] [address]   Respiratory (~20 pathogens) panel by PCR     Status: None   Collection Time: 11/04/21  8:46 AM   Specimen: Nasopharyngeal Swab; Respiratory  Result Value Ref Range Status   Adenovirus NOT DETECTED NOT  DETECTED Final   Coronavirus 229E NOT DETECTED NOT DETECTED Final    Comment: (NOTE) The Coronavirus on the Respiratory Panel, DOES NOT test for the novel  Coronavirus (2019 nCoV)    Coronavirus HKU1 NOT DETECTED NOT DETECTED Final   Coronavirus NL63 NOT DETECTED NOT DETECTED Final   Coronavirus OC43 NOT DETECTED NOT DETECTED Final   Metapneumovirus NOT DETECTED NOT DETECTED Final   Rhinovirus / Enterovirus NOT DETECTED NOT DETECTED Final   Influenza A NOT DETECTED NOT DETECTED Final   Influenza B NOT DETECTED NOT DETECTED Final   Parainfluenza Virus 1 NOT DETECTED NOT DETECTED Final   Parainfluenza Virus 2 NOT DETECTED NOT DETECTED Final   Parainfluenza Virus 3 NOT DETECTED NOT DETECTED Final   Parainfluenza Virus 4 NOT DETECTED NOT DETECTED Final   Respiratory Syncytial Virus NOT DETECTED NOT DETECTED Final   Bordetella pertussis NOT DETECTED NOT DETECTED Final   Bordetella Parapertussis NOT DETECTED NOT DETECTED Final   Chlamydophila pneumoniae NOT DETECTED NOT DETECTED Final   Mycoplasma pneumoniae NOT DETECTED NOT DETECTED Final    Comment: Performed at [facility], [place]. [address] [place], [place] [address]  Studies:  DG Chest Portable 1 View  Result Date: 11/04/2021 CLINICAL DATA:  Cough, shortness of breath. EXAM: PORTABLE CHEST 1 VIEW COMPARISON:  06/18/2021. FINDINGS: The heart size and mediastinal contours are within normal limits. Lung volumes are low and there are strandy opacities at the lung bases. No effusion or pneumothorax. No acute osseous abnormality. A stable left chest port is noted. IMPRESSION: Low lung volumes with strandy atelectasis or infiltrate at the lung bases. Electronically Signed   By: Brett Fairy M.D.   On: 11/04/2021 04:33    Assessment: 61 y.o. female   Bilateral lower lobe pneumonia Neutropenia secondary to chemotherapy Pancytopenia secondary to chemotherapy Stage II right breast cancer, triple negative, on neoadjuvant  chemotherapy HTN   Plan:  -I have reviewed her labs and images -I agree with broad antibiotics due to her neutropenia, compromised immune system due to chemotherapy. -she has received GCSF after last cycle chemo on 12/28, no more GCSF needed at this point -her next chemo is scheduled for 1/18 -we will f/u as needed before discharge, please call us if needed.    Truitt Merle, MD 11/04/2021  6:10 PM
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
## 2021-11-04 NOTE — ED Notes (Signed)
Pt ambulated to bathroom with one staff stand-by assist.

## 2021-11-04 NOTE — ED Provider Notes (Signed)
Emergency Medicine Provider Triage Evaluation Note  Rita Davis , a 61 y.o. female  was evaluated in triage.  Pt complains of shortness of breath and productive cough.  Symptoms started 11/01/2021.  Patient reports that cough is producing yellow-green mucus.  Denies any improvement in symptoms with over-the-counter cough and flu medication.  Review of Systems  Positive: Productive cough, shortness of breath, rhinorrhea Negative: Leg swelling or tenderness, chest pain, hemoptysis, lightheadedness, syncope  Physical Exam  BP (!) 141/89 (BP Location: Left Arm)    Pulse (!) 112    Temp 98.2 F (36.8 C) (Oral)    Resp 12    SpO2 100%  Gen:   Awake, no distress   Resp:  Normal effort, lungs clear to auscultation bilaterally MSK:   Moves extremities without difficulty, no swelling or tenderness to bilateral lower extremities Other:    Medical Decision Making  Medically screening exam initiated at 4:25 AM.  Appropriate orders placed.  Rita Davis was informed that the remainder of the evaluation will be completed by another provider, this initial triage assessment does not replace that evaluation, and the importance of remaining in the ED until their evaluation is complete.  Per chart review patient currently being treated with chemotherapy for right breast malignancy.  Patient noted to have tachycardia on exam.  Due to reports of shortness of breath, known malignancy, tachycardia concern for possible PE.  Will obtain CTA of chest for further evaluation.   Rita Beckwith, PA-C 11/04/21 0427    Rita Hacker, MD 11/05/21 [phone]

## 2021-11-04 NOTE — ED Triage Notes (Signed)
Pt reports with shortness of breath and cough for the past few days. Pt states that she has been taking Theraflu for her symptoms but they are not getting better. Pt reports receiving chemotherapy recently.

## 2021-11-04 NOTE — Progress Notes (Signed)
Pharmacy Antibiotic Note  Rita Davis is a 61 y.o. female with breast cancer currently undergoing chemotherapy treatment (last cycle on 10/28/21) presented to the ED on 11/04/2021 with c/o SOB. CXR on 11/04/21 showed "strandy atelectasis or infiltrate at the lung bases." ANC on admission was 0.2.  Pharmacy has been consulted to dose vancomycin and cefepime for suspected PNA and febrile neutropenia..  Plan: - cefepime 2gm IV q8h - vancomycin 1000mg  IV q12h for est AUC 488  ___________________________________________  Temp (24hrs), Avg:98.2 F (36.8 C), Min:98.2 F (36.8 C), Max:98.2 F (36.8 C)  Recent Labs  Lab 11/04/21 0416  WBC 0.9*  CREATININE 0.76    Estimated Creatinine Clearance: 85.5 mL/min (by C-G formula based on SCr of 0.76 mg/dL).    No Known Allergies   Thank you for allowing pharmacy to be a part of this patients care.  Lynelle Doctor 11/04/2021 1:26 PM

## 2021-11-05 DIAGNOSIS — E871 Hypo-osmolality and hyponatremia: Secondary | ICD-10-CM | POA: Diagnosis present

## 2021-11-05 DIAGNOSIS — J189 Pneumonia, unspecified organism: Secondary | ICD-10-CM | POA: Diagnosis present

## 2021-11-05 DIAGNOSIS — D701 Agranulocytosis secondary to cancer chemotherapy: Secondary | ICD-10-CM | POA: Diagnosis present

## 2021-11-05 DIAGNOSIS — I10 Essential (primary) hypertension: Secondary | ICD-10-CM | POA: Diagnosis present

## 2021-11-05 DIAGNOSIS — F419 Anxiety disorder, unspecified: Secondary | ICD-10-CM | POA: Diagnosis present

## 2021-11-05 DIAGNOSIS — Z800 Family history of malignant neoplasm of digestive organs: Secondary | ICD-10-CM | POA: Diagnosis not present

## 2021-11-05 DIAGNOSIS — D61810 Antineoplastic chemotherapy induced pancytopenia: Secondary | ICD-10-CM | POA: Diagnosis present

## 2021-11-05 DIAGNOSIS — E860 Dehydration: Secondary | ICD-10-CM | POA: Diagnosis present

## 2021-11-05 DIAGNOSIS — Z7984 Long term (current) use of oral hypoglycemic drugs: Secondary | ICD-10-CM | POA: Diagnosis not present

## 2021-11-05 DIAGNOSIS — T451X5A Adverse effect of antineoplastic and immunosuppressive drugs, initial encounter: Secondary | ICD-10-CM

## 2021-11-05 DIAGNOSIS — Z171 Estrogen receptor negative status [ER-]: Secondary | ICD-10-CM | POA: Diagnosis not present

## 2021-11-05 DIAGNOSIS — C50911 Malignant neoplasm of unspecified site of right female breast: Secondary | ICD-10-CM | POA: Diagnosis present

## 2021-11-05 DIAGNOSIS — Z803 Family history of malignant neoplasm of breast: Secondary | ICD-10-CM | POA: Diagnosis not present

## 2021-11-05 DIAGNOSIS — E785 Hyperlipidemia, unspecified: Secondary | ICD-10-CM | POA: Diagnosis present

## 2021-11-05 DIAGNOSIS — Z20822 Contact with and (suspected) exposure to covid-19: Secondary | ICD-10-CM | POA: Diagnosis present

## 2021-11-05 DIAGNOSIS — R0602 Shortness of breath: Secondary | ICD-10-CM | POA: Diagnosis present

## 2021-11-05 DIAGNOSIS — E119 Type 2 diabetes mellitus without complications: Secondary | ICD-10-CM | POA: Diagnosis present

## 2021-11-05 DIAGNOSIS — Z8249 Family history of ischemic heart disease and other diseases of the circulatory system: Secondary | ICD-10-CM | POA: Diagnosis not present

## 2021-11-05 DIAGNOSIS — Z79899 Other long term (current) drug therapy: Secondary | ICD-10-CM | POA: Diagnosis not present

## 2021-11-05 DIAGNOSIS — Z8042 Family history of malignant neoplasm of prostate: Secondary | ICD-10-CM | POA: Diagnosis not present

## 2021-11-05 LAB — CBC WITH DIFFERENTIAL/PLATELET
Abs Immature Granulocytes: 0 10*3/uL (ref 0.00–0.07)
Basophils Absolute: 0 10*3/uL (ref 0.0–0.1)
Basophils Relative: 0 %
Eosinophils Absolute: 0.1 10*3/uL (ref 0.0–0.5)
Eosinophils Relative: 6 %
HCT: 29.3 % — ABNORMAL LOW (ref 36.0–46.0)
Hemoglobin: 9.5 g/dL — ABNORMAL LOW (ref 12.0–15.0)
Lymphocytes Relative: 72 %
Lymphs Abs: 0.9 10*3/uL (ref 0.7–4.0)
MCH: 29.6 pg (ref 26.0–34.0)
MCHC: 32.4 g/dL (ref 30.0–36.0)
MCV: 91.3 fL (ref 80.0–100.0)
Monocytes Absolute: 0.1 10*3/uL (ref 0.1–1.0)
Monocytes Relative: 11 %
Neutro Abs: 0.1 10*3/uL — CL (ref 1.7–7.7)
Neutrophils Relative %: 11 %
Platelets: 74 10*3/uL — ABNORMAL LOW (ref 150–400)
RBC: 3.21 MIL/uL — ABNORMAL LOW (ref 3.87–5.11)
RDW: 15.6 % — ABNORMAL HIGH (ref 11.5–15.5)
WBC: 1.2 10*3/uL — CL (ref 4.0–10.5)
nRBC: 0 % (ref 0.0–0.2)

## 2021-11-05 LAB — MRSA NEXT GEN BY PCR, NASAL: MRSA by PCR Next Gen: NOT DETECTED

## 2021-11-05 LAB — COMPREHENSIVE METABOLIC PANEL
ALT: 20 U/L (ref 0–44)
AST: 14 U/L — ABNORMAL LOW (ref 15–41)
Albumin: 3.4 g/dL — ABNORMAL LOW (ref 3.5–5.0)
Alkaline Phosphatase: 69 U/L (ref 38–126)
Anion gap: 6 (ref 5–15)
BUN: 12 mg/dL (ref 6–20)
CO2: 26 mmol/L (ref 22–32)
Calcium: 8.9 mg/dL (ref 8.9–10.3)
Chloride: 105 mmol/L (ref 98–111)
Creatinine, Ser: 0.65 mg/dL (ref 0.44–1.00)
GFR, Estimated: >60 mL/min (ref >60)
Glucose, Bld: 101 mg/dL — ABNORMAL HIGH (ref 70–99)
Potassium: 4 mmol/L (ref 3.5–5.1)
Sodium: 137 mmol/L (ref 135–145)
Total Bilirubin: 0.7 mg/dL (ref 0.3–1.2)
Total Protein: 6.5 g/dL (ref 6.5–8.1)

## 2021-11-05 LAB — CBC
HCT: 29.1 % — ABNORMAL LOW (ref 36.0–46.0)
Hemoglobin: 9.5 g/dL — ABNORMAL LOW (ref 12.0–15.0)
MCH: 29.6 pg (ref 26.0–34.0)
MCHC: 32.6 g/dL (ref 30.0–36.0)
MCV: 90.7 fL (ref 80.0–100.0)
Platelets: 76 10*3/uL — ABNORMAL LOW (ref 150–400)
RBC: 3.21 MIL/uL — ABNORMAL LOW (ref 3.87–5.11)
RDW: 15.5 % (ref 11.5–15.5)
WBC: 1.2 10*3/uL — CL (ref 4.0–10.5)
nRBC: 0 % (ref 0.0–0.2)

## 2021-11-05 LAB — HIV ANTIBODY (ROUTINE TESTING W REFLEX): HIV Screen 4th Generation wRfx: NONREACTIVE

## 2021-11-05 MED ORDER — GUAIFENESIN-DM 100-10 MG/5ML PO SYRP
5.0000 mL | ORAL_SOLUTION | ORAL | Status: DC | PRN
  Administered 2021-11-05: 5 mL via ORAL
  Filled 2021-11-05: qty 10

## 2021-11-05 NOTE — Progress Notes (Addendum)
PROGRESS NOTE   Rita Davis  QQV:[id number]    DOB: [birth year]    DOA: 11/04/2021  PCP: Jenel Lucks, PA-C   I have briefly reviewed patients previous medical records in [facility].  Chief Complaint  Patient presents with   Shortness of Breath   Cough    Brief Narrative:  61 year old female with medical history significant for triple negative breast cancer, on neoadjuvant chemotherapy, DM, HLD, HTN, anxiety, presented with complaints of productive cough and dyspnea.  Symptoms worsened to a point where she was severely dyspneic and could not lie flat.  Admitted for pneumonia complicating chemotherapy induced neutropenia.  Broad-spectrum IV antibiotics.  Oncology consulted.   Assessment & Plan:  Principal Problem:   Pneumonia of both lower lobes due to infectious organism   Suspected bibasal pneumonia complicating chemotherapy-induced severe neutropenia: Presented with productive cough and severe dyspnea. Chest x-ray showed atelectasis or infiltrate at the lung bases. Flu panel, COVID-19 RT-PCR, MRSA, RSV panel, urine pneumococcal antigen: Negative.   Blood cultures x2: Negative to date. Currently on empiric cefepime and vancomycin, continue until neutropenia improves.  Chemotherapy induced pancytopenia/neutropenia Admission labs: WBC 0.9, ANC 0.2, hemoglobin 9.8 and platelets 86. As per oncology, she received G-CSF after last chemo cycle on 12/28, does not recommend any further G-CSF. Monitor CBCs daily.  Starting to slowly improve. Neutropenic precautions.  Dehydration with hyponatremia Resolved after IV fluids.  Discontinued IV fluids.  Essential hypertension Reasonably controlled on amlodipine and irbesartan, continue.  Hyperlipidemia Continue statins  Prediabetes A1c 5.3.  Anxiety  Triple negative breast cancer Oncology consultation appreciated.  Outpatient follow-up.  Body mass index is 28.02 kg/m.    DVT prophylaxis: SCDs Start:  11/04/21 1822     Code Status: Full Code Family Communication: None at bedside. Disposition:  Status is: Observation  The patient will require care spanning > 2 midnights and should be moved to inpatient because: Persistent neutropenia with pneumonia, need for IV antibiotics and close monitoring of CBCs daily.       Consultants:   Medical oncology.  Procedures:   None  Antimicrobials:   IV cefepime and vancomycin 1/4 >.   Subjective:  Overall feels much better.  Had significant cough productive of green sputum on arrival which has improved and now has cough with minimal or no sputum.  Had dyspnea and could not even lay flat prior to admission, now dyspnea is improved and breathing probably close to baseline.  Denies fever or chills.  No chest pain.  Eating well.  Feels like she is ready to go home.  Objective:   Vitals:   11/04/21 2100 11/05/21 0325 11/05/21 0400 11/05/21 0602  BP: (!) 155/83 (!) 147/91  (!) 141/92  Pulse: 87 (!) 106  88  Resp: (!) 24 17 15 20   Temp: 98 F (36.7 C) 99 F (37.2 C)  98.2 F (36.8 C)  TempSrc: Oral Oral  Oral  SpO2: 99% 100%  99%  Weight:      Height:        General exam: Middle-age female, moderately built and nourished sitting up comfortably in reclining chair without distress.  Does not look septic or toxic. Respiratory system: Slightly diminished breath sounds in the bases with occasional basal crackles but otherwise clear to auscultation.  No increased work of breathing.  Telemetry personally reviewed: Sinus rhythm.  Discontinued telemetry. Cardiovascular system: S1 & S2 heard, RRR. No JVD, murmurs, rubs, gallops or clicks. No pedal edema. Gastrointestinal system: Abdomen is nondistended, soft  and nontender. No organomegaly or masses felt. Normal bowel sounds heard. Central nervous system: Alert and oriented. No focal neurological deficits. Extremities: Symmetric 5 x 5 power. Skin: No rashes, lesions or ulcers Psychiatry: Judgement  and insight appear normal. Mood & affect appropriate.     Data Reviewed:   I have personally reviewed following labs and imaging studies   CBC: Recent Labs  Lab 11/04/21 0416 11/05/21 0313  WBC 0.9* 1.2*   1.2*  NEUTROABS 0.2* 0.1*  HGB 9.8* 9.5*   9.5*  HCT 30.3* 29.3*   29.1*  MCV 89.9 91.3   90.7  PLT 86* 74*   76*    Basic Metabolic Panel: Recent Labs  Lab 11/04/21 0416 11/05/21 0313  NA 131* 137  K 3.6 4.0  CL 101 105  CO2 26 26  GLUCOSE 125* 101*  BUN 15 12  CREATININE 0.76 0.65  CALCIUM 8.6* 8.9    Liver Function Tests: Recent Labs  Lab 11/04/21 0416 11/05/21 0313  AST 15 14*  ALT 21 20  ALKPHOS 75 69  BILITOT 0.5 0.7  PROT 7.1 6.5  ALBUMIN 3.7 3.4*    CBG: No results for input(s): GLUCAP in the last 168 hours.  Microbiology Studies:   Recent Results (from the past 240 hour(s))  Resp Panel by RT-PCR (Flu A&B, Covid) Nasopharyngeal Swab     Status: None   Collection Time: 11/04/21  4:07 AM   Specimen: Nasopharyngeal Swab; Nasopharyngeal(NP) swabs in vial transport medium  Result Value Ref Range Status   SARS Coronavirus 2 by RT PCR NEGATIVE NEGATIVE Final    Comment: (NOTE) SARS-CoV-2 target nucleic acids are NOT DETECTED.  The SARS-CoV-2 RNA is generally detectable in upper respiratory specimens during the acute phase of infection. The lowest concentration of SARS-CoV-2 viral copies this assay can detect is 138 copies/mL. A negative result does not preclude SARS-Cov-2 infection and should not be used as the sole basis for treatment or other patient management decisions. A negative result may occur with  improper specimen collection/handling, submission of specimen other than nasopharyngeal swab, presence of viral mutation(s) within the areas targeted by this assay, and inadequate number of viral copies(<138 copies/mL). A negative result must be combined with clinical observations, patient history, and epidemiological information. The  expected result is Negative.  Fact Sheet for Patients:  EntrepreneurPulse.com.au  Fact Sheet for Healthcare Providers:  IncredibleEmployment.be  This test is no t yet approved or cleared by the [place] FDA and  has been authorized for detection and/or diagnosis of SARS-CoV-2 by FDA under an Emergency Use Authorization (EUA). This EUA will remain  in effect (meaning this test can be used) for the duration of the COVID-19 declaration under Section 564(b)(1) of the Act, 21 U.S.C.section 360bbb-3(b)(1), unless the authorization is terminated  or revoked sooner.       Influenza A by PCR NEGATIVE NEGATIVE Final   Influenza B by PCR NEGATIVE NEGATIVE Final    Comment: (NOTE) The Xpert Xpress SARS-CoV-2/FLU/RSV plus assay is intended as an aid in the diagnosis of influenza from Nasopharyngeal swab specimens and should not be used as a sole basis for treatment. Nasal washings and aspirates are unacceptable for Xpert Xpress SARS-CoV-2/FLU/RSV testing.  Fact Sheet for Patients: EntrepreneurPulse.com.au  Fact Sheet for Healthcare Providers: IncredibleEmployment.be  This test is not yet approved or cleared by the [place] FDA and has been authorized for detection and/or diagnosis of SARS-CoV-2 by FDA under an Emergency Use Authorization (EUA). This EUA will remain  in effect (meaning this test can be used) for the duration of the COVID-19 declaration under Section 564(b)(1) of the Act, 21 U.S.C. section 360bbb-3(b)(1), unless the authorization is terminated or revoked.  Performed at [facility], [place] [address] [place], [place] [address]   Blood culture (routine x 2)     Status: None (Preliminary result)   Collection Time: 11/04/21  5:33 AM   Specimen: BLOOD RIGHT HAND  Result Value Ref Range Status   Specimen Description   Final    BLOOD RIGHT HAND Performed at [place] [address] [place], [place] [address]    Special Requests   Final    BOTTLES DRAWN AEROBIC AND ANAEROBIC Blood Culture results may not be optimal due to an inadequate volume of blood received in culture bottles Performed at [place] [address] [place], [place] [address]    Culture   Final    NO GROWTH 1 DAY Performed at [facility], [place] [address] [place], [place] [address]    Report Status PENDING  Incomplete  Blood culture (routine x 2)     Status: None (Preliminary result)   Collection Time: 11/04/21  5:33 AM   Specimen: Left Antecubital; Blood  Result Value Ref Range Status   Specimen Description   Final    LEFT ANTECUBITAL Performed at [place] [address], [place], [place] [address]    Special Requests   Final    BOTTLES DRAWN AEROBIC AND ANAEROBIC Blood Culture results may not be optimal due to an inadequate volume of blood received in culture bottles Performed at [place] [address], [place], [place] [address]    Culture   Final    NO GROWTH 1 DAY Performed at [facility], [place] [address] [place], [place] [address]    Report Status PENDING  Incomplete  Respiratory (~20 pathogens) panel by PCR     Status: None   Collection Time: 11/04/21  8:46 AM   Specimen: Nasopharyngeal Swab; Respiratory  Result Value Ref Range Status   Adenovirus NOT DETECTED NOT DETECTED Final   Coronavirus 229E NOT DETECTED NOT DETECTED Final    Comment: (NOTE) The Coronavirus on the Respiratory Panel, DOES NOT test for the novel  Coronavirus (2019 nCoV)    Coronavirus HKU1 NOT DETECTED NOT DETECTED Final   Coronavirus NL63 NOT DETECTED NOT DETECTED Final   Coronavirus OC43 NOT DETECTED NOT DETECTED Final   Metapneumovirus NOT DETECTED NOT DETECTED Final   Rhinovirus / Enterovirus NOT DETECTED NOT DETECTED Final   Influenza A NOT DETECTED NOT DETECTED Final   Influenza B  NOT DETECTED NOT DETECTED Final   Parainfluenza Virus 1 NOT DETECTED NOT DETECTED Final   Parainfluenza Virus 2 NOT DETECTED NOT DETECTED Final   Parainfluenza Virus 3 NOT DETECTED NOT DETECTED Final   Parainfluenza Virus 4 NOT DETECTED NOT DETECTED Final   Respiratory Syncytial Virus NOT DETECTED NOT DETECTED Final   Bordetella pertussis NOT DETECTED NOT DETECTED Final   Bordetella Parapertussis NOT DETECTED NOT DETECTED Final   Chlamydophila pneumoniae NOT DETECTED NOT DETECTED Final   Mycoplasma pneumoniae NOT DETECTED NOT DETECTED Final    Comment: Performed at [facility], [place]. [address] [place],  [address]  MRSA Next Gen by PCR, Nasal     Status: None   Collection Time: 11/05/21  8:42 AM   Specimen: Nasal Mucosa; Nasal Swab  Result  Value Ref Range Status   MRSA by PCR Next Gen NOT DETECTED NOT DETECTED Final    Comment: (NOTE) The GeneXpert MRSA Assay (FDA approved for NASAL specimens only), is one component of a comprehensive MRSA colonization surveillance program. It is not intended to diagnose MRSA infection nor to guide or monitor treatment for MRSA infections. Test performance is not FDA approved in patients less than 61 years old. Performed at [facility], [place] [address] [place], [place] [address]     Radiology Studies:  DG Chest Portable 1 View  Result Date: 11/04/2021 CLINICAL DATA:  Cough, shortness of breath. EXAM: PORTABLE CHEST 1 VIEW COMPARISON:  06/18/2021. FINDINGS: The heart size and mediastinal contours are within normal limits. Lung volumes are low and there are strandy opacities at the lung bases. No effusion or pneumothorax. No acute osseous abnormality. A stable left chest port is noted. IMPRESSION: Low lung volumes with strandy atelectasis or infiltrate at the lung bases. Electronically Signed   By: Brett Fairy M.D.   On: 11/04/2021 04:33    Scheduled Meds:    amLODipine  5 mg Oral Daily   And   irbesartan  300 mg  Oral Daily   Chlorhexidine Gluconate Cloth  6 each Topical Daily   guaiFENesin  600 mg Oral BID   rosuvastatin  20 mg Oral Daily    Continuous Infusions:    ceFEPime (MAXIPIME) IV 2 g (11/05/21 0532)   vancomycin 1,000 mg (11/05/21 0609)     LOS: 0 days     Vernell Leep, MD,  FACP, [facility], [facility], [facility] (Care Management Physician Certified) [place]  To contact the attending provider between 7A-7P or the covering provider during after hours 7P-7A, please log into the web site [email or web address] and access using universal [place] password for that web site. If you do not have the password, please call the hospital operator.  11/05/2021, 10:23 AM

## 2021-11-05 NOTE — Progress Notes (Signed)
Date and time results received: 11/05/21 0820   Test: Absolute Neutrophil Critical Value: 0.1  Name of Provider Notified: A.Hongalgi  Orders Received? Or Actions Taken?: No new orders at this time.

## 2021-11-06 LAB — CBC WITH DIFFERENTIAL/PLATELET
Abs Immature Granulocytes: 0.13 10*3/uL — ABNORMAL HIGH (ref 0.00–0.07)
Basophils Absolute: 0.1 10*3/uL (ref 0.0–0.1)
Basophils Relative: 2 %
Eosinophils Absolute: 0.2 10*3/uL (ref 0.0–0.5)
Eosinophils Relative: 6 %
HCT: 26.6 % — ABNORMAL LOW (ref 36.0–46.0)
Hemoglobin: 8.7 g/dL — ABNORMAL LOW (ref 12.0–15.0)
Immature Granulocytes: 5 %
Lymphocytes Relative: 32 %
Lymphs Abs: 0.8 10*3/uL (ref 0.7–4.0)
MCH: 29.6 pg (ref 26.0–34.0)
MCHC: 32.7 g/dL (ref 30.0–36.0)
MCV: 90.5 fL (ref 80.0–100.0)
Monocytes Absolute: 0.2 10*3/uL (ref 0.1–1.0)
Monocytes Relative: 9 %
Neutro Abs: 1.1 10*3/uL — ABNORMAL LOW (ref 1.7–7.7)
Neutrophils Relative %: 46 %
Platelets: 81 10*3/uL — ABNORMAL LOW (ref 150–400)
RBC: 2.94 MIL/uL — ABNORMAL LOW (ref 3.87–5.11)
RDW: 15.5 % (ref 11.5–15.5)
WBC: 2.5 10*3/uL — ABNORMAL LOW (ref 4.0–10.5)
nRBC: 0 % (ref 0.0–0.2)

## 2021-11-06 LAB — LEGIONELLA PNEUMOPHILA SEROGP 1 UR AG: L. pneumophila Serogp 1 Ur Ag: NEGATIVE

## 2021-11-06 MED ORDER — PHENOL 1.4 % MT LIQD
1.0000 | OROMUCOSAL | Status: DC | PRN
  Administered 2021-11-06: 1 via OROMUCOSAL
  Filled 2021-11-06: qty 177

## 2021-11-06 MED ORDER — HEPARIN SOD (PORK) LOCK FLUSH 100 UNIT/ML IV SOLN
500.0000 [IU] | INTRAVENOUS | Status: AC | PRN
  Administered 2021-11-06: 500 [IU]

## 2021-11-06 MED ORDER — CEFPODOXIME PROXETIL 200 MG PO TABS: 200.0000 mg | ORAL_TABLET | Freq: Two times a day (BID) | ORAL | 0 refills | Status: AC

## 2021-11-06 NOTE — TOC Transition Note (Signed)
Transition of Care [facility]) - CM/SW Discharge Note   Patient Details  Name: Rita Davis MRN: [id number] Date of Birth: 04-12-1961  [facility] CM/SW Contact:  Leeroy Cha, RN Phone Number: 11/06/2021, 1:24 PM   Clinical Narrative:    Pt transitioned to home with no toc needs   Final next level of care: Home/Self Care Barriers to Discharge: Barriers Resolved   Patient Goals and CMS Choice Patient states their goals for this hospitalization and ongoing recovery are:: to go home CMS Medicare.gov Compare Post Acute Care list provided to:: Patient Choice offered to / list presented to : Patient  Discharge Placement                       Discharge Plan and Services   Discharge Planning Services: CM Consult                                 Social Determinants of Health (SDOH) Interventions     Readmission Risk Interventions No flowsheet data found.

## 2021-11-06 NOTE — Discharge Summary (Signed)
Physician Discharge Summary  Rita Davis DJT:[id number] DOB: 05-29-1961  PCP: Jenel Lucks, PA-C  Admitted from: Home Discharged to: Home  Admit date: 11/04/2021 Discharge date: 11/06/2021  Recommendations for Outpatient Follow-up:    Follow-up Information     Jenel Lucks, PA-C Follow up in 1 week(s).   Specialty: Internal Medicine Contact information: [place] [address] [phone]         Nicholas Lose, MD Follow up on 11/18/2021.   Specialty: Hematology and Oncology Why: Keep prior appointment.  To be seen with repeat labs (CBC with differential and CMP). Contact information: [place] [phone] [place]: None    Equipment/Devices: None    Discharge Condition: Improved and stable   Code Status: Full Code Diet recommendation:  Discharge Diet Orders (From admission, onward)     Start     Ordered   11/06/21 0000  Diet - low sodium heart healthy        11/06/21 1309             Discharge Diagnoses:  Principal Problem:   Pneumonia of both lower lobes due to infectious organism Active Problems:   Bilateral pneumonia   Brief Summary: 61 year old female with medical history significant for triple negative breast cancer, on neoadjuvant chemotherapy, DM, HLD, HTN, anxiety, presented with complaints of productive cough and dyspnea. Symptoms worsened to a point where she was severely dyspneic and could not lie flat.  Admitted for pneumonia complicating chemotherapy induced neutropenia.  Broad-spectrum IV antibiotics.  Oncology consulted.     Assessment & Plan:    Suspected bibasal pneumonia complicating chemotherapy-induced severe neutropenia: Presented with productive cough and severe dyspnea. Chest x-ray showed atelectasis or infiltrate at the lung bases. Flu panel, COVID-19 RT-PCR, MRSA, RSV panel, urine pneumococcal antigen:  Negative.   Blood cultures x2: Negative to date. Treated empirically with cefepime and vancomycin.  Has completed almost 3 days course.  Neutropenia has improved.  Discussed with Dr. Burr Medico, oncology and appropriate to DC home on oral antibiotics to complete course.  Patient has follow-up with her primary oncologist Dr. Lindi Adie with repeat labs on 1/18. Transitioned to cefpodoxime x4 days to complete total 7 days course.  Also confirmed with hospital pharmacist that she is not on chronic penicillin V that was noted on her med list. Recommend repeating chest x-ray in 4 weeks to ensure resolution of abnormal findings on chest x-ray.   Chemotherapy induced pancytopenia/neutropenia Admission labs: WBC 0.9, ANC 0.2, hemoglobin 9.8 and platelets 86. As per oncology, she received G-CSF after last chemo cycle on 12/28, does not recommend any further G-CSF. Neutropenic precautions. Starting to improve.  WBC up to 2.5, ANC 1.1, hemoglobin with minimal drop to 8.7 in the absence of overt bleeding and platelets improved to 81. Follow CBC as outpatient with oncologist.  Dehydration with hyponatremia Resolved after IV fluids.  Discontinued IV fluids.  Essential hypertension Reasonably controlled on amlodipine and irbesartan, continue.  Hyperlipidemia Continue statins  Prediabetes A1c 5.3.  Anxiety  Triple negative breast cancer Oncology consultation appreciated.  Outpatient follow-up.   Body mass index is 28.02 kg/m.       Consultants:   Medical oncology.   Procedures:   None  Discharge Instructions  Discharge Instructions     Call MD for:  difficulty breathing, headache or visual disturbances   Complete by: As directed  Call MD for:  extreme fatigue   Complete by: As directed    Call MD for:  persistant dizziness or light-headedness   Complete by: As directed    Call MD for:  persistant nausea and vomiting   Complete by: As directed    Call MD for:  severe uncontrolled pain    Complete by: As directed    Call MD for:  temperature >100.4   Complete by: As directed    Diet - low sodium heart healthy   Complete by: As directed    Increase activity slowly   Complete by: As directed         Medication List     STOP taking these medications    penicillin v potassium 500 MG tablet Commonly known as: VEETID       TAKE these medications    acetaminophen 500 MG tablet Commonly known as: TYLENOL Take 1,000 mg by mouth every 6 (six) hours as needed for mild pain.   amLODipine-olmesartan 5-40 MG tablet Commonly known as: AZOR Take 1 tablet by mouth in the morning.   cefpodoxime 200 MG tablet Commonly known as: VANTIN Take 1 tablet (200 mg total) by mouth 2 (two) times daily for 4 days.   dexamethasone 4 MG tablet Commonly known as: DECADRON Take 2 tablets once a day for 3 days after carboplatin and AC chemotherapy. Take with food. What changed:  how much to take how to take this when to take this additional instructions   lidocaine-prilocaine cream Commonly known as: EMLA Apply to affected area once What changed:  how much to take how to take this when to take this reasons to take this additional instructions   MUCINEX PO Take 1 tablet by mouth 2 (two) times daily as needed (cough/chest congestion).   prochlorperazine 10 MG tablet Commonly known as: COMPAZINE Take 1 tablet (10 mg total) by mouth every 6 (six) hours as needed (Nausea or vomiting).   rosuvastatin 20 MG tablet Commonly known as: CRESTOR Take 1 tablet (20 mg total) by mouth daily.   SAMBUCUS ELDERBERRY IMMUNE PO Take 5 mLs by mouth daily.   Super Greens Powd Take 1 Scoop by mouth daily.   THERAFLU COLD & SORE THROAT PO Take 5-10 mLs by mouth every 4 (four) hours as needed (cold/cough symptoms). Day and Night pack   Vitamin D3 250 MCG (10000 UT) Tabs Take 10,000 Units by mouth daily.       No Known Allergies    Procedures/Studies: DG Chest Portable 1  View  Result Date: 11/04/2021 CLINICAL DATA:  Cough, shortness of breath. EXAM: PORTABLE CHEST 1 VIEW COMPARISON:  06/18/2021. FINDINGS: The heart size and mediastinal contours are within normal limits. Lung volumes are low and there are strandy opacities at the lung bases. No effusion or pneumothorax. No acute osseous abnormality. A stable left chest port is noted. IMPRESSION: Low lung volumes with strandy atelectasis or infiltrate at the lung bases. Electronically Signed   By: Brett Fairy M.D.   On: 11/04/2021 04:33      Subjective: Denies complaints.  Anxious to go home.  No cough, dyspnea, fever, chills.  Good appetite.  Ambulating freely in the halls and in her room.  No bleeding.  Discharge Exam:  Vitals:   11/05/21 1226 11/05/21 2039 11/06/21 0551 11/06/21 1348  BP: 123/79 (!) 147/112 134/84 121/87  Pulse: 85 99 91 (!) 110  Resp: 18 16 17 16   Temp: 98.1 F (36.7 C) 99.3 F (37.4  C) 98.3 F (36.8 C) 98.9 F (37.2 C)  TempSrc: Oral Oral Oral Oral  SpO2: 100% 100% 100% 99%  Weight:      Height:        General exam: Middle-age female, moderately built and nourished ambulating comfortably in the room without distress. Respiratory system: Clear to auscultation.  No increased work of breathing. Cardiovascular system: S1 & S2 heard, RRR. No JVD, murmurs, rubs, gallops or clicks. No pedal edema. Gastrointestinal system: Abdomen is nondistended, soft and nontender. No organomegaly or masses felt. Normal bowel sounds heard. Central nervous system: Alert and oriented. No focal neurological deficits. Extremities: Symmetric 5 x 5 power. Skin: No rashes, lesions or ulcers Psychiatry: Judgement and insight appear normal. Mood & affect appropriate.     The results of significant diagnostics from this hospitalization (including imaging, microbiology, ancillary and laboratory) are listed below for reference.     Microbiology: Recent Results (from the past 240 hour(s))  Resp Panel by  RT-PCR (Flu A&B, Covid) Nasopharyngeal Swab     Status: None   Collection Time: 11/04/21  4:07 AM   Specimen: Nasopharyngeal Swab; Nasopharyngeal(NP) swabs in vial transport medium  Result Value Ref Range Status   SARS Coronavirus 2 by RT PCR NEGATIVE NEGATIVE Final    Comment: (NOTE) SARS-CoV-2 target nucleic acids are NOT DETECTED.  The SARS-CoV-2 RNA is generally detectable in upper respiratory specimens during the acute phase of infection. The lowest concentration of SARS-CoV-2 viral copies this assay can detect is 138 copies/mL. A negative result does not preclude SARS-Cov-2 infection and should not be used as the sole basis for treatment or other patient management decisions. A negative result may occur with  improper specimen collection/handling, submission of specimen other than nasopharyngeal swab, presence of viral mutation(s) within the areas targeted by this assay, and inadequate number of viral copies(<138 copies/mL). A negative result must be combined with clinical observations, patient history, and epidemiological information. The expected result is Negative.  Fact Sheet for Patients:  EntrepreneurPulse.com.au  Fact Sheet for Healthcare Providers:  IncredibleEmployment.be  This test is no t yet approved or cleared by the [place] FDA and  has been authorized for detection and/or diagnosis of SARS-CoV-2 by FDA under an Emergency Use Authorization (EUA). This EUA will remain  in effect (meaning this test can be used) for the duration of the COVID-19 declaration under Section 564(b)(1) of the Act, 21 U.S.C.section 360bbb-3(b)(1), unless the authorization is terminated  or revoked sooner.       Influenza A by PCR NEGATIVE NEGATIVE Final   Influenza B by PCR NEGATIVE NEGATIVE Final    Comment: (NOTE) The Xpert Xpress SARS-CoV-2/FLU/RSV plus assay is intended as an aid in the diagnosis of influenza from Nasopharyngeal swab  specimens and should not be used as a sole basis for treatment. Nasal washings and aspirates are unacceptable for Xpert Xpress SARS-CoV-2/FLU/RSV testing.  Fact Sheet for Patients: EntrepreneurPulse.com.au  Fact Sheet for Healthcare Providers: IncredibleEmployment.be  This test is not yet approved or cleared by the [place] FDA and has been authorized for detection and/or diagnosis of SARS-CoV-2 by FDA under an Emergency Use Authorization (EUA). This EUA will remain in effect (meaning this test can be used) for the duration of the COVID-19 declaration under Section 564(b)(1) of the Act, 21 U.S.C. section 360bbb-3(b)(1), unless the authorization is terminated or revoked.  Performed at [facility], [place] [address] [place], [place] [address]   Blood culture (routine x 2)  Status: None (Preliminary result)   Collection Time: 11/04/21  5:33 AM   Specimen: BLOOD RIGHT HAND  Result Value Ref Range Status   Specimen Description   Final    BLOOD RIGHT HAND Performed at [facility] [address], [place], [place] [address]    Special Requests   Final    BOTTLES DRAWN AEROBIC AND ANAEROBIC Blood Culture results may not be optimal due to an inadequate volume of blood received in culture bottles Performed at [facility] [address], [place], [place] [address]    Culture   Final    NO GROWTH 2 DAYS Performed at [facility] [address] [place], [place] [address]    Report Status PENDING  Incomplete  Blood culture (routine x 2)     Status: None (Preliminary result)   Collection Time: 11/04/21  5:33 AM   Specimen: Left Antecubital; Blood  Result Value Ref Range Status   Specimen Description   Final    LEFT ANTECUBITAL Performed at [facility] [address], [place], [place] [address]    Special Requests   Final    BOTTLES DRAWN AEROBIC AND  ANAEROBIC Blood Culture results may not be optimal due to an inadequate volume of blood received in culture bottles Performed at [facility] [address] [place], [place] [address]    Culture   Final    NO GROWTH 2 DAYS Performed at [facility] [address] [place], [place] [address]    Report Status PENDING  Incomplete  Respiratory (~20 pathogens) panel by PCR     Status: None   Collection Time: 11/04/21  8:46 AM   Specimen: Nasopharyngeal Swab; Respiratory  Result Value Ref Range Status   Adenovirus NOT DETECTED NOT DETECTED Final   Coronavirus 229E NOT DETECTED NOT DETECTED Final    Comment: (NOTE) The Coronavirus on the Respiratory Panel, DOES NOT test for the novel  Coronavirus (2019 nCoV)    Coronavirus HKU1 NOT DETECTED NOT DETECTED Final   Coronavirus NL63 NOT DETECTED NOT DETECTED Final   Coronavirus OC43 NOT DETECTED NOT DETECTED Final   Metapneumovirus NOT DETECTED NOT DETECTED Final   Rhinovirus / Enterovirus NOT DETECTED NOT DETECTED Final   Influenza A NOT DETECTED NOT DETECTED Final   Influenza B NOT DETECTED NOT DETECTED Final   Parainfluenza Virus 1 NOT DETECTED NOT DETECTED Final   Parainfluenza Virus 2 NOT DETECTED NOT DETECTED Final   Parainfluenza Virus 3 NOT DETECTED NOT DETECTED Final   Parainfluenza Virus 4 NOT DETECTED NOT DETECTED Final   Respiratory Syncytial Virus NOT DETECTED NOT DETECTED Final   Bordetella pertussis NOT DETECTED NOT DETECTED Final   Bordetella Parapertussis NOT DETECTED NOT DETECTED Final   Chlamydophila pneumoniae NOT DETECTED NOT DETECTED Final   Mycoplasma pneumoniae NOT DETECTED NOT DETECTED Final    Comment: Performed at [facility], [place]. [address], [place], [place] [address]  MRSA Next Gen by PCR, Nasal     Status: None   Collection Time: 11/05/21  8:42 AM   Specimen: Nasal Mucosa; Nasal Swab  Result Value Ref Range Status   MRSA by PCR Next Gen NOT DETECTED NOT DETECTED Final     Comment: (NOTE) The GeneXpert MRSA Assay (FDA approved for NASAL specimens only), is one component of a comprehensive MRSA colonization surveillance program. It is not intended to diagnose MRSA infection nor to guide or monitor treatment for MRSA  infections. Test performance is not FDA approved in patients less than 38 years old. Performed at [facility], [place] [address] [place], [place] [address]      Labs: CBC: Recent Labs  Lab 11/04/21 0416 11/05/21 0313 11/06/21 0413  WBC 0.9* 1.2*   1.2* 2.5*  NEUTROABS 0.2* 0.1* 1.1*  HGB 9.8* 9.5*   9.5* 8.7*  HCT 30.3* 29.3*   29.1* 26.6*  MCV 89.9 91.3   90.7 90.5  PLT 86* 74*   76* 81*    Basic Metabolic Panel: Recent Labs  Lab 11/04/21 0416 11/05/21 0313  NA 131* 137  K 3.6 4.0  CL 101 105  CO2 26 26  GLUCOSE 125* 101*  BUN 15 12  CREATININE 0.76 0.65  CALCIUM 8.6* 8.9    Liver Function Tests: Recent Labs  Lab 11/04/21 0416 11/05/21 0313  AST 15 14*  ALT 21 20  ALKPHOS 75 69  BILITOT 0.5 0.7  PROT 7.1 6.5  ALBUMIN 3.7 3.4*    CBG: No results for input(s): GLUCAP in the last 168 hours.  Hgb A1c Recent Labs    11/04/21 0919  HGBA1C 5.3      Time coordinating discharge: 25 minutes  SIGNED:  Vernell Leep, MD,  FACP, [facility], [facility], [facility] (Care Management Physician Certified). [facility] Advisor  To contact the attending provider between 7A-7P or the covering provider during after hours 7P-7A, please log into the web site [email or web address] and access using universal [place] password for that web site. If you do not have the password, please call the hospital operator.

## 2021-11-06 NOTE — Discharge Instructions (Signed)

## 2021-11-06 NOTE — Progress Notes (Signed)
Pharmacy Antibiotic Note  Rita Davis is a 61 y.o. female with breast cancer currently undergoing chemotherapy treatment (last cycle on 10/28/21) presented to the ED on 11/04/2021 with c/o SOB. CXR on 11/04/21 showed "strandy atelectasis or infiltrate at the lung bases." ANC on admission was 0.2.Marland Kitchen  Pharmacy has been consulted for Cefepime dosing.  Day #3 abx, Cefepime - Tmax 99.3 - WBC improved 2.5 (ANC 1.1) s/p pegfilgrastim 12/30 - SCr stable 0.65 (1/5), CrCl 85 ml/min - Cultures neg to date  Plan: Continue Cefepime 2g IV q8h No dose adjustments needed, Pharmacy will sign off  Height: 5\' 8"  (172.7 cm) Weight: 83.6 kg (184 lb 4.9 oz) IBW/kg (Calculated) : 63.9  Temp (24hrs), Avg:98.6 F (37 C), Min:98.1 F (36.7 C), Max:99.3 F (37.4 C)  Recent Labs  Lab 11/04/21 0416 11/05/21 0313 11/06/21 0413  WBC 0.9* 1.2*   1.2* 2.5*  CREATININE 0.76 0.65  --     Estimated Creatinine Clearance: 84.8 mL/min (by C-G formula based on SCr of 0.65 mg/dL).    No Known Allergies  Antimicrobials this admission: 1/4 Cefepime>> 1/4 Vanc>>1/5  Dose adjustments this admission: none  Microbiology results: 1/4 bcx x2: ngtd 1/4 ur strep pnemo: neg 1/4 ur legionella: IP 1/4 Respiratory panel: neg Sputum: ordered, not collected 1/5 MRSA PCR: neg  Thank you for allowing pharmacy to be a part of this patients care.  Peggyann Juba, PharmD, BCPS Pharmacy: [phone] 11/06/2021 8:15 AM

## 2021-11-06 NOTE — TOC Initial Note (Signed)
Transition of Care [facility]) - Initial/Assessment Note    Patient Details  Name: Rita Davis MRN: [id number] Date of Birth: 1961/08/16  Transition of [facility]) CM/SW Contact:    Leeroy Cha, RN Phone Number: 11/06/2021, 8:11 AM  Clinical Narrative:                  Transition of Care [facility]) Screening Note   Patient Details  Name: Rita Davis Date of Birth: Apr 28, 1961   Transition of Care [facility]) CM/SW Contact:    Leeroy Cha, RN Phone Number: 11/06/2021, 8:11 AM    Transition of Care Department [facility]) has reviewed patient and no TOC needs have been identified at this time. We will continue to monitor patient advancement through interdisciplinary progression rounds. If new patient transition needs arise, please place a TOC consult.    Expected Discharge Plan: Home/Self Care Barriers to Discharge: Continued Medical Work up   Patient Goals and CMS Choice Patient states their goals for this hospitalization and ongoing recovery are:: to go home CMS Medicare.gov Compare Post Acute Care list provided to:: Patient Choice offered to / list presented to : Patient  Expected Discharge Plan and Services Expected Discharge Plan: Home/Self Care   Discharge Planning Services: CM Consult   Living arrangements for the past 2 months: Single Family Home                                      Prior Living Arrangements/Services Living arrangements for the past 2 months: Single Family Home Lives with:: Spouse Patient language and need for interpreter reviewed:: Yes Do you feel safe going back to the place where you live?: Yes               Activities of Daily Living Home Assistive Devices/Equipment: None ADL Screening (condition at time of admission) Patient's cognitive ability adequate to safely complete daily activities?: Yes Is the patient deaf or have difficulty hearing?: No Does the patient have difficulty seeing, even when wearing glasses/contacts?:  No Does the patient have difficulty concentrating, remembering, or making decisions?: No Patient able to express need for assistance with ADLs?: Yes Does the patient have difficulty dressing or bathing?: No Independently performs ADLs?: Yes (appropriate for developmental age) Does the patient have difficulty walking or climbing stairs?: No Weakness of Legs: None Weakness of Arms/Hands: None  Permission Sought/Granted                  Emotional Assessment   Attitude/Demeanor/Rapport: Engaged Affect (typically observed): Calm Orientation: : Oriented to Place, Oriented to Self, Oriented to  Time, Oriented to Situation Alcohol / Substance Use: Not Applicable Psych Involvement: No (comment)  Admission diagnosis:  Chemotherapy-induced neutropenia ([place]) [D70.1, T45.1X5A] Pneumonia of both lower lobes due to infectious organism [J18.9] Community acquired pneumonia, unspecified laterality [J18.9] Bilateral pneumonia [J18.9] Patient Active Problem List   Diagnosis Date Noted   Bilateral pneumonia 11/05/2021   Pneumonia of both lower lobes due to infectious organism 11/04/2021   Genetic testing 07/10/2021   Port-A-Cath in place 06/24/2021   Family history of breast cancer 06/09/2021   Family history of prostate cancer 06/09/2021   Family history of colon cancer 06/09/2021   Malignant neoplasm of upper-inner quadrant of right breast in female, estrogen receptor negative ([place]) 06/09/2021   Pneumonia 03/21/2018   Fibroid tumor 03/10/2018   Adjustment insomnia 11/26/2015   Eczema 11/26/2015   Other specified  abnormal findings of blood chemistry 11/26/2015   Healthcare maintenance 04/10/2014   Anxiety state 02/28/2014   OVERWEIGHT 03/27/2009   Overweight 03/27/2009   HYPERLIPIDEMIA 01/24/2009   ANEMIA 11/20/2007   HYPERTENSION 11/20/2007   PCP:  Jenel Lucks, PA-C Pharmacy:   [place], [place] [address] Phone: [phone] Fax: [phone]     Social Determinants of Health (SDOH) Interventions    Readmission Risk Interventions No flowsheet data found.

## 2021-11-06 NOTE — Progress Notes (Signed)
Patient chest port de-accessed. RN educated pt about discharge instructions. Patient discharged.

## 2021-11-09 LAB — CULTURE, BLOOD (ROUTINE X 2)
Culture: NO GROWTH
Culture: NO GROWTH

## 2021-11-11 ENCOUNTER — Encounter: Payer: Self-pay | Admitting: Hematology and Oncology

## 2021-11-11 NOTE — Progress Notes (Signed)
Called pt regarding copay assistance for Rita Davis for 2023.  I left a msg requesting she return my call if she's interested in reapplying for assistance.

## 2021-11-17 ENCOUNTER — Other Ambulatory Visit: Payer: Self-pay | Admitting: Hematology and Oncology

## 2021-11-17 MED FILL — Fosaprepitant Dimeglumine For IV Infusion 150 MG (Base Eq): INTRAVENOUS | Qty: 5 | Status: AC

## 2021-11-17 MED FILL — Dexamethasone Sodium Phosphate Inj 100 MG/10ML: INTRAMUSCULAR | Qty: 1 | Status: AC

## 2021-11-17 NOTE — Assessment & Plan Note (Signed)
status post right breast upper inner quadrant biopsy 06/03/2021 for a clinical T2N0 invasive ductal carcinoma, grade 3, functionally triple negative, with an MIB-1 of 95%.  Current Treatment: neoadjuvant chemotherapy consisting of carboplatin paclitaxel and pembrolizumab for 4 cycles starting 06/23/2021, to be followed by doxorubicin and cyclophosphamide and pembrolizumab for 4 cycles, with pembrolizumab to be continued to complete a year.  11/04/21-11/06/21: Hospitalization for pneumonia with neutropenia  Plan: 1. Definitive surgery 2. Continuation of Pembrolizumab  3. Adj XRT

## 2021-11-17 NOTE — Progress Notes (Signed)
Patient Care Team: Jenel Lucks, PA-C as PCP - General (Internal Medicine) Rockwell Germany, RN as Oncology Nurse Navigator Mauro Kaufmann, RN as Oncology Nurse Navigator Stark Klein, MD as Consulting Physician (General Surgery) Nicholas Lose, MD as Consulting Physician (Hematology and Oncology)  DIAGNOSIS:    ICD-10-CM   1. Malignant neoplasm of upper-inner quadrant of right breast in female, estrogen receptor negative ([place])  C50.211 MR BREAST BILATERAL W [place] CAD   Z17.1       SUMMARY OF ONCOLOGIC HISTORY: Oncology History  Malignant neoplasm of upper-inner quadrant of right breast in female, estrogen receptor negative ([place])  06/03/2021 Initial Diagnosis   Rita Davis status post right breast upper inner quadrant biopsy 06/03/2021 for a clinical T2N0 invasive ductal carcinoma, grade 3, functionally triple negative, with an MIB-1 of 95%.   06/09/2021 Cancer Staging   Staging form: Breast, AJCC 8th Edition - Clinical: Stage IIB (cT2, cN0, cM0, G3, ER-, PR-, HER2-) - Signed by Chauncey Cruel, MD on 06/09/2021 Histologic grading system: 3 grade system    06/23/2021 -  Neo-Adjuvant Chemotherapy   neoadjuvant chemotherapy consisting of carboplatin paclitaxel and pembrolizumab for 4 cycles starting 06/23/2021, to be followed by doxorubicin and cyclophosphamide and pembrolizumab for 4 cycles, with pembrolizumab to be continued to complete a year   06/30/2021 Genetic Testing   Patient has genetic testing done for personal history of breast cancer. No pathogenic variants were detected. Of note, a variant of uncertain significance was detected in the BRIP1 and TSC2 genes.  The genes analyzed include: APC, ATM, AXIN2, BARD1, BMPR1A, BRCA1, BRCA2, BRIP1, CDH1, CDK4*, CDKN2A (p14ARF)*, CDKN2A (p16INK4a)*, CHEK2, CTNNA1, DICER1, EPCAM (Deletion/duplication testing only), GREM1 (promoter region deletion/duplication testing only), KIT, MEN1, MLH1, MSH2, MSH3, MSH6,  MUTYH, NBN, NF1, NHTL1, PALB2, PDGFRA*, PMS2, POLD1, POLE, PTEN, RAD50, RAD51C, RAD51D, SDHB, SDHC, SDHD, SMAD4, SMARCA4. STK11, TP53, TSC1, TSC2, and VHL.  The following genes were evaluated for sequence changes only: SDHA and HOXB13 c.251G>A variant only.    11/04/2021 - 11/06/2021 Hospital Admission   Neutropenia related pneumonia     CHIEF COMPLIANT: Completed 2 cycles of Adriamycin, Cytoxan, Keytruda, today is cycle 3 of Adriamycin and Cytoxan, establishing oncology care with me  INTERVAL HISTORY: Rita Davis is a 61 y.o. with above-mentioned history of functionally triple negative breast cancer, currently on chemotherapy with Adriamycin, Cytoxan, Keytruda. She presents to the clinic today for follow-up.  She was admitted after cycle 2 with neutropenic fever and pneumonia.  She was treated with broad-spectrum antibiotics and was able to be discharged home on 11/06/2021.  She is here to discuss the neoadjuvant treatment plan. She is very nervous about receiving today's chemotherapy because of pneumonia that she had after cycle 2.  She will also having palpitations after cycle 2.  She has recovered from the pneumonia and feels so much better today.  She did have neuropathy for which she is using some holistic provider and spent 7 grand for some treatments  ALLERGIES:  has No Known Allergies.  MEDICATIONS:  Current Outpatient Medications  Medication Sig Dispense Refill   acetaminophen (TYLENOL) 500 MG tablet Take 1,000 mg by mouth every 6 (six) hours as needed for mild pain.     amLODipine-olmesartan (AZOR) 5-40 MG tablet Take 1 tablet by mouth in the morning.     Cholecalciferol (VITAMIN D3) 250 MCG (10000 UT) TABS Take 10,000 Units by mouth daily.     dexamethasone (DECADRON) 4 MG tablet Take 2 tablets once  a day for 3 days after carboplatin and AC chemotherapy. Take with food. (Patient taking differently: Take 8 mg by mouth See admin instructions. Take 8 mg once a day for 3 days after  carboplatin and AC chemotherapy. Take with food.) 30 tablet 1   guaiFENesin (MUCINEX PO) Take 1 tablet by mouth 2 (two) times daily as needed (cough/chest congestion).     lidocaine-prilocaine (EMLA) cream Apply to affected area once (Patient taking differently: Apply 1 application topically daily as needed (port site access).) 30 g 3   Misc Natural Products (SAMBUCUS ELDERBERRY IMMUNE PO) Take 5 mLs by mouth daily.     Misc Natural Products (SUPER GREENS) POWD Take 1 Scoop by mouth daily.     Pheniramine-PE-APAP (THERAFLU COLD & SORE THROAT PO) Take 5-10 mLs by mouth every 4 (four) hours as needed (cold/cough symptoms). Day and Night pack     prochlorperazine (COMPAZINE) 10 MG tablet Take 1 tablet (10 mg total) by mouth every 6 (six) hours as needed (Nausea or vomiting). 30 tablet 1   rosuvastatin (CRESTOR) 20 MG tablet Take 1 tablet (20 mg total) by mouth daily.     No current facility-administered medications for this visit.    PHYSICAL EXAMINATION: ECOG PERFORMANCE STATUS: 1 - Symptomatic but completely ambulatory  Vitals:   11/18/21 0847  BP: (!) 144/80  Pulse: 82  Resp: 18  Temp: (!) 97.5 F (36.4 C)  SpO2: 99%   Filed Weights   11/18/21 0847  Weight: 186 lb 4 oz (84.5 kg)     LABORATORY DATA:  I have reviewed the data as listed CMP Latest Ref Rng & Units 11/18/2021 11/05/2021 11/04/2021  Glucose 70 - 99 mg/dL 113(H) 101(H) 125(H)  BUN 6 - 20 mg/dL '14 12 15  ' Creatinine 0.44 - 1.00 mg/dL 0.84 0.65 0.76  Sodium 135 - 145 mmol/L 139 137 131(L)  Potassium 3.5 - 5.1 mmol/L 3.9 4.0 3.6  Chloride 98 - 111 mmol/L 105 105 101  CO2 22 - 32 mmol/L '28 26 26  ' Calcium 8.9 - 10.3 mg/dL 9.4 8.9 8.6(L)  Total Protein 6.5 - 8.1 g/dL 6.9 6.5 7.1  Total Bilirubin 0.3 - 1.2 mg/dL 0.2(L) 0.7 0.5  Alkaline Phos 38 - 126 U/L 74 69 75  AST 15 - 41 U/L 22 14(L) 15  ALT 0 - 44 U/L '22 20 21    ' Lab Results  Component Value Date   WBC 7.4 11/18/2021   HGB 9.8 (L) 11/18/2021   HCT 30.3 (L)  11/18/2021   MCV 89.4 11/18/2021   PLT 416 (H) 11/18/2021   NEUTROABS 5.3 11/18/2021    ASSESSMENT & PLAN:  Malignant neoplasm of upper-inner quadrant of right breast in female, estrogen receptor negative ([place]) status post right breast upper inner quadrant biopsy 06/03/2021 for a clinical T2N0 invasive ductal carcinoma, grade 3, functionally triple negative, ER 5%, PR 0%, HER2 2+ by IHC FISH negative, Ki-67 95%    Current Treatment: neoadjuvant chemotherapy consisting of carboplatin paclitaxel and pembrolizumab for 4 cycles starting 06/23/2021, to be followed by doxorubicin and cyclophosphamide and pembrolizumab for 4 cycles, with pembrolizumab to be continued to complete a year.  She completed 11 cycles of Taxol (with [place] every 3 weeks), received 2 cycles of Adriamycin and Cytoxan with Keytruda Chemo toxicities: 11/04/21-11/06/21: Hospitalization for pneumonia with neutropenia 2. chemotherapy-induced anemia Reduce the dose of chemotherapy for cycle 3 Adriamycin and Cytoxan We will plan for breast MRI on 12/10/2021.  Plan: 1. Definitive surgery: Breast  conserving surgery with sentinel lymph node biopsy 2. Continuation of Pembrolizumab (we will hold pembrolizumab after surgery until she returns back from her vacation in [place] in April) 3. Adj XRT  Return to clinic in 1 week for toxicity evaluation She will finish chemotherapy on 12/09/2021.  Orders Placed This Encounter  Procedures   MR BREAST BILATERAL W WO CONTRAST INC CAD    Standing Status:   Future    Standing Expiration Date:   11/18/2022    Order Specific Question:   If indicated for the ordered procedure, I authorize the administration of contrast media per Radiology protocol    Answer:   Yes    Order Specific Question:   What is the patient's sedation requirement?    Answer:   No Sedation    Order Specific Question:   Does the patient have a pacemaker or implanted devices?    Answer:   No    Order Specific Question:    Preferred imaging location?    Answer:   [facility] (table limit-550lbs)    Order Specific Question:   Release to patient    Answer:   Immediate   The patient has a good understanding of the overall plan. she agrees with it. she will call with any problems that may develop before the next visit here.  Total time spent: 60 mins including face to face time and time spent for planning, charting and coordination of care  Rulon Eisenmenger, MD, MPH 11/18/2021  I, Thana Ates, am acting as scribe for Dr. Nicholas Lose.  I have reviewed the above documentation for accuracy and completeness, and I agree with the above.

## 2021-11-18 ENCOUNTER — Inpatient Hospital Stay: Payer: BC Managed Care – PPO

## 2021-11-18 ENCOUNTER — Encounter: Payer: Self-pay | Admitting: *Deleted

## 2021-11-18 ENCOUNTER — Inpatient Hospital Stay: Payer: BC Managed Care – PPO | Attending: Genetic Counselor

## 2021-11-18 ENCOUNTER — Inpatient Hospital Stay: Payer: BC Managed Care – PPO | Admitting: Hematology and Oncology

## 2021-11-18 ENCOUNTER — Other Ambulatory Visit: Payer: Self-pay

## 2021-11-18 DIAGNOSIS — Z5112 Encounter for antineoplastic immunotherapy: Secondary | ICD-10-CM | POA: Insufficient documentation

## 2021-11-18 DIAGNOSIS — Z5189 Encounter for other specified aftercare: Secondary | ICD-10-CM | POA: Insufficient documentation

## 2021-11-18 DIAGNOSIS — Z79899 Other long term (current) drug therapy: Secondary | ICD-10-CM | POA: Diagnosis not present

## 2021-11-18 DIAGNOSIS — C50211 Malignant neoplasm of upper-inner quadrant of right female breast: Secondary | ICD-10-CM | POA: Diagnosis present

## 2021-11-18 DIAGNOSIS — T451X5A Adverse effect of antineoplastic and immunosuppressive drugs, initial encounter: Secondary | ICD-10-CM | POA: Insufficient documentation

## 2021-11-18 DIAGNOSIS — Z171 Estrogen receptor negative status [ER-]: Secondary | ICD-10-CM | POA: Insufficient documentation

## 2021-11-18 DIAGNOSIS — Z5111 Encounter for antineoplastic chemotherapy: Secondary | ICD-10-CM | POA: Diagnosis present

## 2021-11-18 DIAGNOSIS — D6481 Anemia due to antineoplastic chemotherapy: Secondary | ICD-10-CM | POA: Insufficient documentation

## 2021-11-18 DIAGNOSIS — Z95828 Presence of other vascular implants and grafts: Secondary | ICD-10-CM

## 2021-11-18 LAB — CMP (CANCER CENTER ONLY)
ALT: 22 U/L (ref 0–44)
AST: 22 U/L (ref 15–41)
Albumin: 3.9 g/dL (ref 3.5–5.0)
Alkaline Phosphatase: 74 U/L (ref 38–126)
Anion gap: 6 (ref 5–15)
BUN: 14 mg/dL (ref 6–20)
CO2: 28 mmol/L (ref 22–32)
Calcium: 9.4 mg/dL (ref 8.9–10.3)
Chloride: 105 mmol/L (ref 98–111)
Creatinine: 0.84 mg/dL (ref 0.44–1.00)
GFR, Estimated: >60 mL/min (ref >60)
Glucose, Bld: 113 mg/dL — ABNORMAL HIGH (ref 70–99)
Potassium: 3.9 mmol/L (ref 3.5–5.1)
Sodium: 139 mmol/L (ref 135–145)
Total Bilirubin: 0.2 mg/dL — ABNORMAL LOW (ref 0.3–1.2)
Total Protein: 6.9 g/dL (ref 6.5–8.1)

## 2021-11-18 LAB — CBC WITH DIFFERENTIAL (CANCER CENTER ONLY)
Abs Immature Granulocytes: 0.03 10*3/uL (ref 0.00–0.07)
Basophils Absolute: 0.1 10*3/uL (ref 0.0–0.1)
Basophils Relative: 1 %
Eosinophils Absolute: 0 10*3/uL (ref 0.0–0.5)
Eosinophils Relative: 1 %
HCT: 30.3 % — ABNORMAL LOW (ref 36.0–46.0)
Hemoglobin: 9.8 g/dL — ABNORMAL LOW (ref 12.0–15.0)
Immature Granulocytes: 0 %
Lymphocytes Relative: 18 %
Lymphs Abs: 1.3 10*3/uL (ref 0.7–4.0)
MCH: 28.9 pg (ref 26.0–34.0)
MCHC: 32.3 g/dL (ref 30.0–36.0)
MCV: 89.4 fL (ref 80.0–100.0)
Monocytes Absolute: 0.7 10*3/uL (ref 0.1–1.0)
Monocytes Relative: 9 %
Neutro Abs: 5.3 10*3/uL (ref 1.7–7.7)
Neutrophils Relative %: 71 %
Platelet Count: 416 10*3/uL — ABNORMAL HIGH (ref 150–400)
RBC: 3.39 MIL/uL — ABNORMAL LOW (ref 3.87–5.11)
RDW: 17.3 % — ABNORMAL HIGH (ref 11.5–15.5)
WBC Count: 7.4 10*3/uL (ref 4.0–10.5)
nRBC: 0 % (ref 0.0–0.2)

## 2021-11-18 LAB — TSH: TSH: 1.76 u[IU]/mL (ref 0.308–3.960)

## 2021-11-18 MED ORDER — SODIUM CHLORIDE 0.9 % IV SOLN: Freq: Once | INTRAVENOUS | Status: AC

## 2021-11-18 MED ORDER — SODIUM CHLORIDE 0.9% FLUSH
10.0000 mL | INTRAVENOUS | Status: DC | PRN
  Administered 2021-11-18: 10 mL via INTRAVENOUS

## 2021-11-18 MED ORDER — SODIUM CHLORIDE 0.9 % IV SOLN
400.0000 mg/m2 | Freq: Once | INTRAVENOUS | Status: AC
  Administered 2021-11-18: 840 mg via INTRAVENOUS
  Filled 2021-11-18: qty 42

## 2021-11-18 MED ORDER — SODIUM CHLORIDE 0.9 % IV SOLN
10.0000 mg | Freq: Once | INTRAVENOUS | Status: AC
  Administered 2021-11-18: 10 mg via INTRAVENOUS
  Filled 2021-11-18: qty 10

## 2021-11-18 MED ORDER — SODIUM CHLORIDE 0.9 % IV SOLN
150.0000 mg | Freq: Once | INTRAVENOUS | Status: AC
  Administered 2021-11-18: 150 mg via INTRAVENOUS
  Filled 2021-11-18: qty 150

## 2021-11-18 MED ORDER — SODIUM CHLORIDE 0.9 % IV SOLN
200.0000 mg | Freq: Once | INTRAVENOUS | Status: AC
  Administered 2021-11-18: 200 mg via INTRAVENOUS
  Filled 2021-11-18: qty 200

## 2021-11-18 MED ORDER — PALONOSETRON HCL INJECTION 0.25 MG/5ML
0.2500 mg | Freq: Once | INTRAVENOUS | Status: AC
  Administered 2021-11-18: 0.25 mg via INTRAVENOUS
  Filled 2021-11-18: qty 5

## 2021-11-18 MED ORDER — HEPARIN SOD (PORK) LOCK FLUSH 100 UNIT/ML IV SOLN
500.0000 [IU] | Freq: Once | INTRAVENOUS | Status: AC
  Administered 2021-11-18: 500 [IU] via INTRAVENOUS

## 2021-11-18 MED ORDER — DOXORUBICIN HCL CHEMO IV INJECTION 2 MG/ML
40.0000 mg/m2 | Freq: Once | INTRAVENOUS | Status: AC
  Administered 2021-11-18: 84 mg via INTRAVENOUS
  Filled 2021-11-18: qty 42

## 2021-11-18 NOTE — Progress Notes (Signed)
New Breast Cancer Diagnosis: Right Breast UIQ  MRI Breast 12/10/2021  Did patient present with symptoms (if so, please note symptoms) or screening mammography?:Screening Mass    Location and Extent of disease :right breast. Located at 1 o'clock position, measured  1.6 cm in greatest dimension. Adenopathy no.  Histology per Pathology Report: grade 3, Invasive Ductal Carcinoma  Receptor Status: ER(negative), PR (negative), Her2-neu (negative), Ki-(95%)  Surgeon and surgical plan, if any: Dr. Barry Dienes  -No appointment scheduled at this time.   Medical oncologist, treatment if any:   Dr. Lindi Adie 11/18/2021 Current Treatment: neoadjuvant chemotherapy consisting of carboplatin paclitaxel and pembrolizumab for 4 cycles starting 06/23/2021, to be followed by doxorubicin and cyclophosphamide and pembrolizumab for 4 cycles, with pembrolizumab to be continued to complete a year.  She completed 11 cycles of Taxol (with [place] every 3 weeks), received 2 cycles of Adriamycin and Cytoxan with Keytruda Chemo toxicities: 11/04/21-11/06/21: Hospitalization for pneumonia with neutropenia 2. chemotherapy-induced anemia Reduce the dose of chemotherapy for cycle 3 Adriamycin and Cytoxan We will plan for breast MRI on 12/10/2021.  Plan: 1. Definitive surgery: Breast conserving surgery with sentinel lymph node biopsy 2. Continuation of Pembrolizumab (we will hold pembrolizumab after surgery until she returns back from her vacation in [place] in April) 3. Adj XRT -Return to clinic in 1 week for toxicity evaluation -She will finish chemotherapy on 12/09/2021.   Family History of Breast/Ovarian/Prostate Cancer: Mom has breast cancer, Dad had prostate, Possible [place] grandmother had Ovarian  Lymphedema issues, if any: No     Pain issues, if any: None    SAFETY ISSUES: Prior radiation? No Pacemaker/ICD? No Possible current pregnancy? Hysterectomy Is the patient on methotrexate? No  Current  Complaints / other details:   -Genetics 06/30/2021: No pathogenic variants were detected.  -Port Insertion 06/18/2021

## 2021-11-19 ENCOUNTER — Encounter: Payer: Self-pay | Admitting: Radiation Oncology

## 2021-11-19 ENCOUNTER — Other Ambulatory Visit: Payer: Self-pay

## 2021-11-19 ENCOUNTER — Ambulatory Visit
Admission: RE | Admit: 2021-11-19 | Discharge: 2021-11-19 | Disposition: A | Discharge status: Home / Self Care | Payer: BC Managed Care – PPO | Source: Ambulatory Visit | Attending: Radiation Oncology | Admitting: Radiation Oncology

## 2021-11-19 VITALS — Ht 68.0 in | Wt 186.0 lb

## 2021-11-19 DIAGNOSIS — C50211 Malignant neoplasm of upper-inner quadrant of right female breast: Secondary | ICD-10-CM

## 2021-11-19 DIAGNOSIS — Z171 Estrogen receptor negative status [ER-]: Secondary | ICD-10-CM

## 2021-11-19 LAB — T4: T4, Total: 6.5 ug/dL (ref 4.5–12.0)

## 2021-11-19 NOTE — Progress Notes (Signed)
Radiation Oncology         [phone] ________________________________  Name: Rita Davis        MRN: [id number]  Date of Service: 11/19/2021 DOB: 1960-12-30  CC:O'Connor, Cleda Clarks, PA-C  Nicholas Lose, MD     REFERRING PHYSICIAN: Nicholas Lose, MD   DIAGNOSIS: The encounter diagnosis was Malignant neoplasm of upper-inner quadrant of right breast in female, estrogen receptor negative ([place]).   HISTORY OF PRESENT ILLNESS: Rita Davis is a 61 y.o. female at the request of Dr. Lindi Adie for a diagnosis of triple negative right breast cancer.  The patient was found on screening detected mammogram to have a mass in the 1 o'clock position measuring up to 2.9 cm in greatest dimension.  Her axilla was negative for adenopathy.  A biopsy was performed of the mass on 06/03/21 And showed a grade 3 invasive ductal carcinoma that was ER weakly positive at 5%, PR negative, HER2 negative with a Ki-67 of 95%.  Functionally she is considered triple negative.  She began neoadjuvant chemotherapy on 06/24/2021 along with [facility] immunotherapy.  She completes chemo on 12/09/21 .  She is undergoing an MRI of the breast on 12/10/2021 and is hoping to proceed with breast conservation surgery with Dr. Barry Dienes.  She is seen to discuss adjuvant radiotherapy.    PREVIOUS RADIATION THERAPY: No   PAST MEDICAL HISTORY:  Past Medical History:  Diagnosis Date   Anemia    had a fibroid tumor, was anemic at that time   Anxiety    Cancer [facility])    breast cancer   Family history of breast cancer    Family history of colon cancer    Family history of prostate cancer    Hyperlipidemia    Hypertension    Pneumonia    as a child   Pre-diabetes        PAST SURGICAL HISTORY: Past Surgical History:  Procedure Laterality Date   ABDOMINAL HYSTERECTOMY     still has ovaries   BREAST BIOPSY Right 05/23/2012   BREAST CYST EXCISION Right    cyst removed     from lower back   DILATION AND CURETTAGE OF UTERUS      ORIF ANKLE FRACTURE Left 11/13/2020   Procedure: OPEN TREATMENT OF LEFT TRIMALLEOLAR ANKLE FRACTURE WITH POSTERIOR FIXATION, SYNDESMOSIS;  Surgeon: Erle Crocker, MD;  Location: [place];  Service: Orthopedics;  Laterality: Left;  LENGTH OF SURGERY: 1.5 HOURS   PORTACATH PLACEMENT Left 06/18/2021   Procedure: PORT PLACEMENT;  Surgeon: Stark Klein, MD;  Location: [place];  Service: General;  Laterality: Left;     FAMILY HISTORY:  Family History  Problem Relation Age of Onset   Hypertension Mother    Breast cancer Mother 51       declined treatment   Prostate cancer Father        metastatic, dx 35s   Colon cancer Brother 31   Cancer Maternal Great-grandmother 7       gynecologic cancer (MGM's mother)     SOCIAL HISTORY:  reports that she has never smoked. She has never used smokeless tobacco. She reports that she does not drink alcohol and does not use drugs. The patient is married and lives in [place]. She enjoys traveling.   ALLERGIES: Patient has no known allergies.   MEDICATIONS:  Current Outpatient Medications  Medication Sig Dispense Refill   acetaminophen (TYLENOL) 500 MG tablet Take 1,000 mg by mouth every 6 (six) hours as needed for  mild pain.     amLODipine-olmesartan (AZOR) 5-40 MG tablet Take 1 tablet by mouth in the morning.     Cholecalciferol (VITAMIN D3) 250 MCG (10000 UT) TABS Take 10,000 Units by mouth daily.     dexamethasone (DECADRON) 4 MG tablet Take 2 tablets once a day for 3 days after carboplatin and AC chemotherapy. Take with food. (Patient taking differently: Take 8 mg by mouth See admin instructions. Take 8 mg once a day for 3 days after carboplatin and AC chemotherapy. Take with food.) 30 tablet 1   guaiFENesin (MUCINEX PO) Take 1 tablet by mouth 2 (two) times daily as needed (cough/chest congestion).     lidocaine-prilocaine (EMLA) cream Apply to affected area once (Patient taking differently: Apply 1 application topically  daily as needed (port site access).) 30 g 3   Misc Natural Products (SAMBUCUS ELDERBERRY IMMUNE PO) Take 5 mLs by mouth daily.     Misc Natural Products (SUPER GREENS) POWD Take 1 Scoop by mouth daily.     Pheniramine-PE-APAP (THERAFLU COLD & SORE THROAT PO) Take 5-10 mLs by mouth every 4 (four) hours as needed (cold/cough symptoms). Day and Night pack     prochlorperazine (COMPAZINE) 10 MG tablet Take 1 tablet (10 mg total) by mouth every 6 (six) hours as needed (Nausea or vomiting). 30 tablet 1   rosuvastatin (CRESTOR) 20 MG tablet Take 1 tablet (20 mg total) by mouth daily.     No current facility-administered medications for this encounter.     REVIEW OF SYSTEMS: On review of systems, the patient reports that she is doing well recovering from recent pneumonia. She reports she is doing better now and is better and ready to proceed with her last chemo in a few weeks. Fortunately she feels that her breast mass has improved quite a bit even that it had started shrinking in October 2022.      PHYSICAL EXAM:  Wt Readings from Last 3 Encounters:  11/19/21 186 lb (84.4 kg)  11/18/21 186 lb 4 oz (84.5 kg)  11/04/21 184 lb 4.9 oz (83.6 kg)   Temp Readings from Last 3 Encounters:  11/18/21 (!) 97.5 F (36.4 C) (Temporal)  11/06/21 98.9 F (37.2 C) (Oral)  10/30/21 98.3 F (36.8 C) (Oral)   BP Readings from Last 3 Encounters:  11/18/21 (!) 144/80  11/06/21 121/87  10/30/21 135/78   Pulse Readings from Last 3 Encounters:  11/18/21 82  11/06/21 (!) 110  10/30/21 77    In general this is a well appearing African American female in no acute distress. She's alert and oriented x4 and appropriate throughout the examination. Cardiopulmonary assessment is negative for acute distress and she exhibits normal effort. Bilateral breast exam is deferred.    ECOG = 1  0 - Asymptomatic (Fully active, able to carry on all predisease activities without restriction)  1 - Symptomatic but  completely ambulatory (Restricted in physically strenuous activity but ambulatory and able to carry out work of a light or sedentary nature. For example, light housework, office work)  2 - Symptomatic, <50% in bed during the day (Ambulatory and capable of all self care but unable to carry out any work activities. Up and about more than 50% of waking hours)  3 - Symptomatic, >50% in bed, but not bedbound (Capable of only limited self-care, confined to bed or chair 50% or more of waking hours)  4 - Bedbound (Completely disabled. Cannot carry on any self-care. Totally confined to bed or chair)  5 - Death   Eustace Pen MM, Creech RH, Tormey DC, et al. [phone]). "Toxicity and response criteria of the [facility]". [place] Oncol. 5 (6): 649-55    LABORATORY DATA:  Lab Results  Component Value Date   WBC 7.4 11/18/2021   HGB 9.8 (L) 11/18/2021   HCT 30.3 (L) 11/18/2021   MCV 89.4 11/18/2021   PLT 416 (H) 11/18/2021   Lab Results  Component Value Date   NA 139 11/18/2021   K 3.9 11/18/2021   CL 105 11/18/2021   CO2 28 11/18/2021   Lab Results  Component Value Date   ALT 22 11/18/2021   AST 22 11/18/2021   ALKPHOS 74 11/18/2021   BILITOT 0.2 (L) 11/18/2021      RADIOGRAPHY: DG Chest Portable 1 View  Result Date: 11/04/2021 CLINICAL DATA:  Cough, shortness of breath. EXAM: PORTABLE CHEST 1 VIEW COMPARISON:  06/18/2021. FINDINGS: The heart size and mediastinal contours are within normal limits. Lung volumes are low and there are strandy opacities at the lung bases. No effusion or pneumothorax. No acute osseous abnormality. A stable left chest port is noted. IMPRESSION: Low lung volumes with strandy atelectasis or infiltrate at the lung bases. Electronically Signed   By: Brett Fairy M.D.   On: 11/04/2021 04:33       IMPRESSION/PLAN: 1. Stage IIB, cT2N0M0 grade 3 functionally triple negative invasive ductal carcinoma of the right breast. Dr. Lisbeth Renshaw discusses the  pathology findings and reviews the nature of triple negative right breast disease. She is anticipating breast conservation with lumpectomy and sentinel node biopsy after completion of chemotherapy. Dr. Lisbeth Renshaw recommends external radiotherapy to the breast  to reduce risks of local recurrence following surgery. We discussed the risks, benefits, short, and long term effects of radiotherapy, as well as the curative intent, and the patient is interested in proceeding. Dr. Lisbeth Renshaw discusses the delivery and logistics of radiotherapy and anticipates a course of 4 or up to 6 1/2 weeks of radiotherapy to the rigth breast. We will see her back a few weeks after surgery to discuss the simulation process and anticipate we starting radiotherapy about 4-6 weeks after surgery.   This encounter was provided by telemedicine platform MyChart.  The patient has provided two factor identification and has given verbal consent for this type of encounter and has been advised to only accept a meeting of this type in a secure network environment. The time spent during this encounter was 60 minutes including preparation, discussion, and coordination of the patient's care. The attendants for this meeting include Blenda Nicely, RN, Dr. Lisbeth Renshaw, Hayden Pedro  and [place].  During the encounter,  Blenda Nicely, RN, Dr. Lisbeth Renshaw, and Hayden Pedro were located at [facility] Radiation Oncology Department.  Cariana Kitner was located at home.     The above documentation reflects my direct findings during this shared patient visit. Please see the separate note by Dr. Lisbeth Renshaw on this date for the remainder of the patient's plan of care.    Carola Rhine, [facility]    **Disclaimer: This note was dictated with voice recognition software. Similar sounding words can inadvertently be transcribed and this note may contain transcription errors which may not have been corrected upon publication of note.**

## 2021-11-20 ENCOUNTER — Inpatient Hospital Stay: Payer: BC Managed Care – PPO

## 2021-11-20 ENCOUNTER — Other Ambulatory Visit: Payer: Self-pay | Admitting: Hematology and Oncology

## 2021-11-20 VITALS — BP 132/78 | HR 66 | Temp 98.7°F | Resp 20

## 2021-11-20 DIAGNOSIS — Z5112 Encounter for antineoplastic immunotherapy: Secondary | ICD-10-CM | POA: Diagnosis not present

## 2021-11-20 DIAGNOSIS — C50211 Malignant neoplasm of upper-inner quadrant of right female breast: Secondary | ICD-10-CM

## 2021-11-20 MED ORDER — PEGFILGRASTIM-CBQV 6 MG/0.6ML ~~LOC~~ SOSY
6.0000 mg | PREFILLED_SYRINGE | Freq: Once | SUBCUTANEOUS | Status: AC
  Administered 2021-11-20: 6 mg via SUBCUTANEOUS
  Filled 2021-11-20: qty 0.6

## 2021-12-02 HISTORY — PX: BREAST LUMPECTOMY: SHX2

## 2021-12-08 MED FILL — Fosaprepitant Dimeglumine For IV Infusion 150 MG (Base Eq): INTRAVENOUS | Qty: 5 | Status: AC

## 2021-12-08 MED FILL — Dexamethasone Sodium Phosphate Inj 100 MG/10ML: INTRAMUSCULAR | Qty: 1 | Status: AC

## 2021-12-09 ENCOUNTER — Inpatient Hospital Stay: Payer: BC Managed Care – PPO

## 2021-12-09 ENCOUNTER — Encounter: Payer: Self-pay | Admitting: Adult Health

## 2021-12-09 ENCOUNTER — Other Ambulatory Visit: Payer: Self-pay

## 2021-12-09 ENCOUNTER — Inpatient Hospital Stay (HOSPITAL_BASED_OUTPATIENT_CLINIC_OR_DEPARTMENT_OTHER): Payer: BC Managed Care – PPO | Admitting: Adult Health

## 2021-12-09 ENCOUNTER — Inpatient Hospital Stay: Payer: BC Managed Care – PPO | Attending: Genetic Counselor

## 2021-12-09 DIAGNOSIS — Z5111 Encounter for antineoplastic chemotherapy: Secondary | ICD-10-CM | POA: Diagnosis present

## 2021-12-09 DIAGNOSIS — Z5189 Encounter for other specified aftercare: Secondary | ICD-10-CM | POA: Diagnosis not present

## 2021-12-09 DIAGNOSIS — Z171 Estrogen receptor negative status [ER-]: Secondary | ICD-10-CM | POA: Diagnosis not present

## 2021-12-09 DIAGNOSIS — Z800 Family history of malignant neoplasm of digestive organs: Secondary | ICD-10-CM | POA: Insufficient documentation

## 2021-12-09 DIAGNOSIS — Z803 Family history of malignant neoplasm of breast: Secondary | ICD-10-CM | POA: Insufficient documentation

## 2021-12-09 DIAGNOSIS — D709 Neutropenia, unspecified: Secondary | ICD-10-CM | POA: Insufficient documentation

## 2021-12-09 DIAGNOSIS — R5383 Other fatigue: Secondary | ICD-10-CM | POA: Insufficient documentation

## 2021-12-09 DIAGNOSIS — C50211 Malignant neoplasm of upper-inner quadrant of right female breast: Secondary | ICD-10-CM

## 2021-12-09 DIAGNOSIS — Z8042 Family history of malignant neoplasm of prostate: Secondary | ICD-10-CM | POA: Diagnosis not present

## 2021-12-09 DIAGNOSIS — Z79899 Other long term (current) drug therapy: Secondary | ICD-10-CM | POA: Insufficient documentation

## 2021-12-09 DIAGNOSIS — T451X5A Adverse effect of antineoplastic and immunosuppressive drugs, initial encounter: Secondary | ICD-10-CM | POA: Diagnosis not present

## 2021-12-09 DIAGNOSIS — D6481 Anemia due to antineoplastic chemotherapy: Secondary | ICD-10-CM | POA: Diagnosis not present

## 2021-12-09 DIAGNOSIS — Z5112 Encounter for antineoplastic immunotherapy: Secondary | ICD-10-CM | POA: Diagnosis not present

## 2021-12-09 DIAGNOSIS — Z95828 Presence of other vascular implants and grafts: Secondary | ICD-10-CM

## 2021-12-09 LAB — CMP (CANCER CENTER ONLY)
ALT: 21 U/L (ref 0–44)
AST: 20 U/L (ref 15–41)
Albumin: 3.9 g/dL (ref 3.5–5.0)
Alkaline Phosphatase: 71 U/L (ref 38–126)
Anion gap: 5 (ref 5–15)
BUN: 16 mg/dL (ref 6–20)
CO2: 28 mmol/L (ref 22–32)
Calcium: 9.1 mg/dL (ref 8.9–10.3)
Chloride: 109 mmol/L (ref 98–111)
Creatinine: 0.81 mg/dL (ref 0.44–1.00)
GFR, Estimated: >60 mL/min (ref >60)
Glucose, Bld: 107 mg/dL — ABNORMAL HIGH (ref 70–99)
Potassium: 3.7 mmol/L (ref 3.5–5.1)
Sodium: 142 mmol/L (ref 135–145)
Total Bilirubin: 0.2 mg/dL — ABNORMAL LOW (ref 0.3–1.2)
Total Protein: 6.4 g/dL — ABNORMAL LOW (ref 6.5–8.1)

## 2021-12-09 LAB — CBC WITH DIFFERENTIAL (CANCER CENTER ONLY)
Abs Immature Granulocytes: 0.02 10*3/uL (ref 0.00–0.07)
Basophils Absolute: 0.1 10*3/uL (ref 0.0–0.1)
Basophils Relative: 1 %
Eosinophils Absolute: 0.1 10*3/uL (ref 0.0–0.5)
Eosinophils Relative: 2 %
HCT: 30.4 % — ABNORMAL LOW (ref 36.0–46.0)
Hemoglobin: 9.9 g/dL — ABNORMAL LOW (ref 12.0–15.0)
Immature Granulocytes: 0 %
Lymphocytes Relative: 15 %
Lymphs Abs: 0.9 10*3/uL (ref 0.7–4.0)
MCH: 29 pg (ref 26.0–34.0)
MCHC: 32.6 g/dL (ref 30.0–36.0)
MCV: 89.1 fL (ref 80.0–100.0)
Monocytes Absolute: 0.6 10*3/uL (ref 0.1–1.0)
Monocytes Relative: 10 %
Neutro Abs: 4.5 10*3/uL (ref 1.7–7.7)
Neutrophils Relative %: 72 %
Platelet Count: 314 10*3/uL (ref 150–400)
RBC: 3.41 MIL/uL — ABNORMAL LOW (ref 3.87–5.11)
RDW: 18.5 % — ABNORMAL HIGH (ref 11.5–15.5)
WBC Count: 6.2 10*3/uL (ref 4.0–10.5)
nRBC: 0 % (ref 0.0–0.2)

## 2021-12-09 LAB — TSH: TSH: 1.456 u[IU]/mL (ref 0.308–3.960)

## 2021-12-09 MED ORDER — DOXORUBICIN HCL CHEMO IV INJECTION 2 MG/ML
40.0000 mg/m2 | Freq: Once | INTRAVENOUS | Status: AC
  Administered 2021-12-09: 84 mg via INTRAVENOUS
  Filled 2021-12-09: qty 42

## 2021-12-09 MED ORDER — PALONOSETRON HCL INJECTION 0.25 MG/5ML
0.2500 mg | Freq: Once | INTRAVENOUS | Status: AC
  Administered 2021-12-09: 0.25 mg via INTRAVENOUS
  Filled 2021-12-09: qty 5

## 2021-12-09 MED ORDER — SODIUM CHLORIDE 0.9% FLUSH
10.0000 mL | INTRAVENOUS | Status: DC | PRN
  Administered 2021-12-09: 10 mL via INTRAVENOUS

## 2021-12-09 MED ORDER — SODIUM CHLORIDE 0.9 % IV SOLN
200.0000 mg | Freq: Once | INTRAVENOUS | Status: AC
  Administered 2021-12-09: 200 mg via INTRAVENOUS
  Filled 2021-12-09: qty 200

## 2021-12-09 MED ORDER — SODIUM CHLORIDE 0.9% FLUSH
10.0000 mL | INTRAVENOUS | Status: DC | PRN
  Administered 2021-12-09: 10 mL

## 2021-12-09 MED ORDER — SODIUM CHLORIDE 0.9 % IV SOLN: Freq: Once | INTRAVENOUS | Status: AC

## 2021-12-09 MED ORDER — HEPARIN SOD (PORK) LOCK FLUSH 100 UNIT/ML IV SOLN
500.0000 [IU] | Freq: Once | INTRAVENOUS | Status: AC | PRN
  Administered 2021-12-09: 500 [IU]

## 2021-12-09 MED ORDER — SODIUM CHLORIDE 0.9 % IV SOLN
10.0000 mg | Freq: Once | INTRAVENOUS | Status: AC
  Administered 2021-12-09: 10 mg via INTRAVENOUS
  Filled 2021-12-09: qty 10

## 2021-12-09 MED ORDER — SODIUM CHLORIDE 0.9 % IV SOLN
400.0000 mg/m2 | Freq: Once | INTRAVENOUS | Status: AC
  Administered 2021-12-09: 840 mg via INTRAVENOUS
  Filled 2021-12-09: qty 42

## 2021-12-09 MED ORDER — SODIUM CHLORIDE 0.9 % IV SOLN
150.0000 mg | Freq: Once | INTRAVENOUS | Status: AC
  Administered 2021-12-09: 150 mg via INTRAVENOUS
  Filled 2021-12-09: qty 150

## 2021-12-09 NOTE — Progress Notes (Signed)
[facility] Follow up:    Rita Lucks, PA-C [address] [place] [address]   DIAGNOSIS:  Cancer Staging  Malignant neoplasm of upper-inner quadrant of right breast in female, estrogen receptor negative ([facility]) Staging form: Breast, AJCC 8th Edition - Clinical: Stage IIB (cT2, cN0, cM0, G3, ER-, PR-, HER2-) - Signed by Chauncey Cruel, MD on 06/09/2021 Histologic grading system: 3 grade system   SUMMARY OF ONCOLOGIC HISTORY: Oncology History  Malignant neoplasm of upper-inner quadrant of right breast in female, estrogen receptor negative ([facility])  06/03/2021 Initial Diagnosis   [place] woman status post right breast upper inner quadrant biopsy 06/03/2021 for a clinical T2N0 invasive ductal carcinoma, grade 3, functionally triple negative, with an MIB-1 of 95%.   06/09/2021 Cancer Staging   Staging form: Breast, AJCC 8th Edition - Clinical: Stage IIB (cT2, cN0, cM0, G3, ER-, PR-, HER2-) - Signed by Chauncey Cruel, MD on 06/09/2021 Histologic grading system: 3 grade system    06/23/2021 -  Neo-Adjuvant Chemotherapy   neoadjuvant chemotherapy consisting of carboplatin paclitaxel and pembrolizumab for 4 cycles starting 06/23/2021, to be followed by doxorubicin and cyclophosphamide and pembrolizumab for 4 cycles, with pembrolizumab to be continued to complete a year   06/30/2021 Genetic Testing   Patient has genetic testing done for personal history of breast cancer. No pathogenic variants were detected. Of note, a variant of uncertain significance was detected in the BRIP1 and TSC2 genes.  The genes analyzed include: APC, ATM, AXIN2, BARD1, BMPR1A, BRCA1, BRCA2, BRIP1, CDH1, CDK4*, CDKN2A (p14ARF)*, CDKN2A (p16INK4a)*, CHEK2, CTNNA1, DICER1, EPCAM (Deletion/duplication testing only), GREM1 (promoter region deletion/duplication testing only), KIT, MEN1, MLH1, MSH2, MSH3, MSH6, MUTYH, NBN, NF1, NHTL1, PALB2, PDGFRA*, PMS2, POLD1, POLE, PTEN, RAD50,  RAD51C, RAD51D, SDHB, SDHC, SDHD, SMAD4, SMARCA4. STK11, TP53, TSC1, TSC2, and VHL.  The following genes were evaluated for sequence changes only: SDHA and HOXB13 c.251G>A variant only.    11/04/2021 - 11/06/2021 Hospital Admission   Neutropenia related pneumonia     CURRENT THERAPY: adriamycin/cytoxan/keytruda  INTERVAL HISTORY: Rita Davis 61 y.o. female returns for evaluation prior to receiving chemotherapy.  She continues to tolerate this well and is overall feeling well today.  She notes that her breast MRI is scheduled tomorrow.  She has f/u with Dr. Barry Dienes on 12/14/2021.    Patient Active Problem List   Diagnosis Date Noted   Bilateral pneumonia 11/05/2021   Pneumonia of both lower lobes due to infectious organism 11/04/2021   Genetic testing 07/10/2021   Port-A-Cath in place 06/24/2021   Family history of breast cancer 06/09/2021   Family history of prostate cancer 06/09/2021   Family history of colon cancer 06/09/2021   Malignant neoplasm of upper-inner quadrant of right breast in female, estrogen receptor negative ([facility]) 06/09/2021   Pneumonia 03/21/2018   Fibroid tumor 03/10/2018   Adjustment insomnia 11/26/2015   Eczema 11/26/2015   Other specified abnormal findings of blood chemistry 11/26/2015   Healthcare maintenance 04/10/2014   Anxiety state 02/28/2014   OVERWEIGHT 03/27/2009   Overweight 03/27/2009   HYPERLIPIDEMIA 01/24/2009   ANEMIA 11/20/2007   HYPERTENSION 11/20/2007    has No Known Allergies.  MEDICAL HISTORY: Past Medical History:  Diagnosis Date   Anemia    had a fibroid tumor, was anemic at that time   Anxiety    Cancer [facility])    breast cancer   Family history of breast cancer    Family history of colon cancer    Family  history of prostate cancer    Hyperlipidemia    Hypertension    Pneumonia    as a child   Pre-diabetes     SURGICAL HISTORY: Past Surgical History:  Procedure Laterality Date   ABDOMINAL HYSTERECTOMY     still has  ovaries   BREAST BIOPSY Right 05/23/2012   BREAST CYST EXCISION Right    cyst removed     from lower back   DILATION AND CURETTAGE OF UTERUS     ORIF ANKLE FRACTURE Left 11/13/2020   Procedure: OPEN TREATMENT OF LEFT TRIMALLEOLAR ANKLE FRACTURE WITH POSTERIOR FIXATION, SYNDESMOSIS;  Surgeon: Erle Crocker, MD;  Location: [facility];  Service: Orthopedics;  Laterality: Left;  LENGTH OF SURGERY: 1.5 HOURS   PORTACATH PLACEMENT Left 06/18/2021   Procedure: PORT PLACEMENT;  Surgeon: Stark Klein, MD;  Location: [facility];  Service: General;  Laterality: Left;    SOCIAL HISTORY: Social History   Socioeconomic History   Marital status: Married    Spouse name: Not on file   Number of children: Not on file   Years of education: Not on file   Highest education level: Not on file  Occupational History   Not on file  Tobacco Use   Smoking status: Never   Smokeless tobacco: Never  Substance and Sexual Activity   Alcohol use: No   Drug use: No   Sexual activity: Not on file  Other Topics Concern   Not on file  Social History Narrative   Not on file   Social Determinants of Health   Financial Resource Strain: Not on file  Food Insecurity: Not on file  Transportation Needs: Not on file  Physical Activity: Not on file  Stress: Not on file  Social Connections: Not on file  Intimate Partner Violence: Not At Risk   Fear of Current or Ex-Partner: No   Emotionally Abused: No   Physically Abused: No   Sexually Abused: No    FAMILY HISTORY: Family History  Problem Relation Age of Onset   Hypertension Mother    Breast cancer Mother 57       declined treatment   Prostate cancer Father        metastatic, dx 40s   Colon cancer Brother 66   Cancer Maternal Great-grandmother 31       gynecologic cancer (MGM's mother)    Review of Systems  Constitutional:  Positive for fatigue. Negative for appetite change, chills, fever and unexpected weight change.  HENT:    Negative for hearing loss, lump/mass and trouble swallowing.   Eyes:  Negative for eye problems and icterus.  Respiratory:  Negative for chest tightness, cough and shortness of breath.   Cardiovascular:  Negative for chest pain, leg swelling and palpitations.  Gastrointestinal:  Negative for abdominal distention, abdominal pain, constipation, diarrhea, nausea and vomiting.  Endocrine: Negative for hot flashes.  Genitourinary:  Negative for difficulty urinating.   Musculoskeletal:  Negative for arthralgias.  Skin:  Negative for itching and rash.  Neurological:  Negative for dizziness, extremity weakness, headaches and numbness.  Hematological:  Negative for adenopathy. Does not bruise/bleed easily.  Psychiatric/Behavioral:  Negative for depression. The patient is not nervous/anxious.      PHYSICAL EXAMINATION  ECOG PERFORMANCE STATUS: 1 - Symptomatic but completely ambulatory  Vitals:   12/09/21 1019  BP: (!) 149/80  Pulse: 84  Resp: 18  Temp: (!) 97.5 F (36.4 C)  SpO2: 100%    Physical Exam Constitutional:  General: She is not in acute distress.    Appearance: Normal appearance. She is not toxic-appearing.  HENT:     Head: Normocephalic and atraumatic.  Eyes:     General: No scleral icterus. Cardiovascular:     Rate and Rhythm: Normal rate and regular rhythm.     Pulses: Normal pulses.     Heart sounds: Normal heart sounds.  Pulmonary:     Effort: Pulmonary effort is normal.     Breath sounds: Normal breath sounds.  Abdominal:     General: Abdomen is flat. Bowel sounds are normal. There is no distension.     Palpations: Abdomen is soft.     Tenderness: There is no abdominal tenderness.  Musculoskeletal:        General: No swelling.     Cervical back: Neck supple.  Lymphadenopathy:     Cervical: No cervical adenopathy.  Skin:    General: Skin is warm and dry.     Findings: No rash.  Neurological:     General: No focal deficit present.     Mental Status:  She is alert.  Psychiatric:        Mood and Affect: Mood normal.        Behavior: Behavior normal.    LABORATORY DATA:  CBC    Component Value Date/Time   WBC 6.2 12/09/2021 0951   WBC 2.5 (L) 11/06/2021 0413   RBC 3.41 (L) 12/09/2021 0951   HGB 9.9 (L) 12/09/2021 0951   HCT 30.4 (L) 12/09/2021 0951   PLT 314 12/09/2021 0951   MCV 89.1 12/09/2021 0951   MCH 29.0 12/09/2021 0951   MCHC 32.6 12/09/2021 0951   RDW 18.5 (H) 12/09/2021 0951   LYMPHSABS 0.9 12/09/2021 0951   MONOABS 0.6 12/09/2021 0951   EOSABS 0.1 12/09/2021 0951   BASOSABS 0.1 12/09/2021 0951    CMP     Component Value Date/Time   NA 142 12/09/2021 0951   K 3.7 12/09/2021 0951   CL 109 12/09/2021 0951   CO2 28 12/09/2021 0951   GLUCOSE 107 (H) 12/09/2021 0951   BUN 16 12/09/2021 0951   CREATININE 0.81 12/09/2021 0951   CALCIUM 9.1 12/09/2021 0951   PROT 6.4 (L) 12/09/2021 0951   ALBUMIN 3.9 12/09/2021 0951   AST 20 12/09/2021 0951   ALT 21 12/09/2021 0951   ALKPHOS 71 12/09/2021 0951   BILITOT 0.2 (L) 12/09/2021 0951   GFRNONAA >60 12/09/2021 0951   GFRAA 86 04/18/2008 1652        ASSESSMENT and THERAPY PLAN:   Malignant neoplasm of upper-inner quadrant of right breast in female, estrogen receptor negative ([facility]) status post right breast upper inner quadrant biopsy 06/03/2021 for a clinical T2N0 invasive ductal carcinoma, grade 3, functionally triple negative, with an MIB-1 of 95%.   Current Treatment: neoadjuvant chemotherapy consisting of carboplatin paclitaxel and pembrolizumab for 4 cycles starting 06/23/2021, to be followed by doxorubicin and cyclophosphamide and pembrolizumab for 4 cycles, with pembrolizumab to be continued to complete a year.  11/04/21-11/06/21: Hospitalization for pneumonia with neutropenia  Plan: 1. Definitive surgery 2. Continuation of Pembrolizumab  3. Adj XRT  _________________________________________________________________  Lind Covert will proceed with chemotherapy  today. I reviewed her labs with her today which are stable and within parameters.  She will undergo breast MRI and f/u with Dr. Barry Dienes after.  Dr. Lindi Adie will see her after breast MRI and Dr. Barry Dienes f/u.     No orders of the defined types were placed in this  encounter.   All questions were answered. The patient knows to call the clinic with any problems, questions or concerns. We can certainly see the patient much sooner if necessary.  Total encounter time: 20 minutes in face to face visit time, chart review, lab review, care coordination, and documentation of the encounter.   Wilber Bihari, NP 12/12/21 6:44 AM Medical Oncology and Hematology [facility] [place], [place] [address] Tel. [phone]    Fax. [phone]  *Total Encounter Time as defined by the Centers for Medicare and Medicaid Services includes, in addition to the face-to-face time of a patient visit (documented in the note above) non-face-to-face time: obtaining and reviewing outside history, ordering and reviewing medications, tests or procedures, care coordination (communications with other health care professionals or caregivers) and documentation in the medical record.

## 2021-12-09 NOTE — Patient Instructions (Signed)
[facility] ONCOLOGY  Discharge Instructions: Thank you for choosing [facility] to provide your oncology and hematology care.   If you have a lab appointment with the [facility], please go directly to the [facility] and check in at the registration area.   Wear comfortable clothing and clothing appropriate for easy access to any Portacath or PICC line.   We strive to give you quality time with your provider. You may need to reschedule your appointment if you arrive late (15 or more minutes).  Arriving late affects you and other patients whose appointments are after yours.  Also, if you miss three or more appointments without notifying the office, you may be dismissed from the clinic at the providers discretion.      For prescription refill requests, have your pharmacy contact our office and allow 72 hours for refills to be completed.    Today you received the following chemotherapy and/or immunotherapy agents: Keytruda/Adriamycin/Cytoxan.      To help prevent nausea and vomiting after your treatment, we encourage you to take your nausea medication as directed.  BELOW ARE SYMPTOMS THAT SHOULD BE REPORTED IMMEDIATELY: *FEVER GREATER THAN 100.4 F (38 C) OR HIGHER *CHILLS OR SWEATING *NAUSEA AND VOMITING THAT IS NOT CONTROLLED WITH YOUR NAUSEA MEDICATION *UNUSUAL SHORTNESS OF BREATH *UNUSUAL BRUISING OR BLEEDING *URINARY PROBLEMS (pain or burning when urinating, or frequent urination) *BOWEL PROBLEMS (unusual diarrhea, constipation, pain near the anus) TENDERNESS IN MOUTH AND THROAT WITH OR WITHOUT PRESENCE OF ULCERS (sore throat, sores in mouth, or a toothache) UNUSUAL RASH, SWELLING OR PAIN  UNUSUAL VAGINAL DISCHARGE OR ITCHING   Items with * indicate a potential emergency and should be followed up as soon as possible or go to the Emergency Department if any problems should occur.  Please show the CHEMOTHERAPY ALERT CARD or IMMUNOTHERAPY ALERT  CARD at check-in to the Emergency Department and triage nurse.  Should you have questions after your visit or need to cancel or reschedule your appointment, please contact [facility]  Dept: [phone]  and follow the prompts.  Office hours are 8:00 a.m. to 4:30 p.m. Monday - Friday. Please note that voicemails left after 4:00 p.m. may not be returned until the following business day.  We are closed weekends and major holidays. You have access to a nurse at all times for urgent questions. Please call the main number to the clinic Dept: [phone] and follow the prompts.   For any non-urgent questions, you may also contact your provider using MyChart. We now offer e-Visits for anyone 13 and older to request care online for non-urgent symptoms. For details visit [email or web address].   Also download the MyChart app! Go to the app store, search "MyChart", open the app, select [facility], and log in with your MyChart username and password.  Due to Covid, a mask is required upon entering the hospital/clinic. If you do not have a mask, one will be given to you upon arrival. For doctor visits, patients may have 1 support person aged 26 or older with them. For treatment visits, patients cannot have anyone with them due to current Covid guidelines and our immunocompromised population.

## 2021-12-10 ENCOUNTER — Encounter: Payer: Self-pay | Admitting: Oncology

## 2021-12-10 ENCOUNTER — Encounter: Payer: Self-pay | Admitting: *Deleted

## 2021-12-10 ENCOUNTER — Encounter: Payer: Self-pay | Admitting: Hematology and Oncology

## 2021-12-10 ENCOUNTER — Ambulatory Visit
Admission: RE | Admit: 2021-12-10 | Discharge: 2021-12-10 | Disposition: A | Discharge status: Home / Self Care | Payer: BC Managed Care – PPO | Source: Ambulatory Visit | Attending: Hematology and Oncology | Admitting: Hematology and Oncology

## 2021-12-10 DIAGNOSIS — C50211 Malignant neoplasm of upper-inner quadrant of right female breast: Secondary | ICD-10-CM

## 2021-12-10 LAB — T4: T4, Total: 6.3 ug/dL (ref 4.5–12.0)

## 2021-12-10 IMAGING — MR MR BREAST BILAT WO/W CM
8 of 12 series · 30 of 48 positions shown · IV contrast (10 ml gadavist)
Comparison: Previous exam(s).
COMPARISON: Previous exam(s).

Addendum:
CLINICAL DATA: 60-year-old female presenting for preoperative
imaging status post neoadjuvant chemotherapy for a grade 3 invasive
ductal carcinoma of the upper inner right breast.

EXAM:
BILATERAL BREAST MRI WITH AND WITHOUT CONTRAST
TECHNIQUE: Multiplanar, multisequence MR images of both breasts were obtained
prior to and following the intravenous administration of 10 ml of
Gadavist

[Series 3: t2_tirm_tra ipat (a-p) · axial · 3.0mm · 0.72mm/px · 1 of 58 slices shown]
[im 1/58]
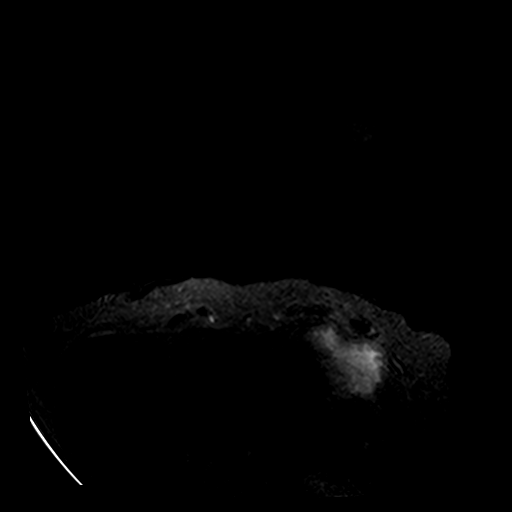

[Series 4: fl3d pre-cm no · axial · non-contrast · 1.2mm · 0.96mm/px · z∈[-76,+95]mm · 5 of 144 slices shown]
[im 1/144]
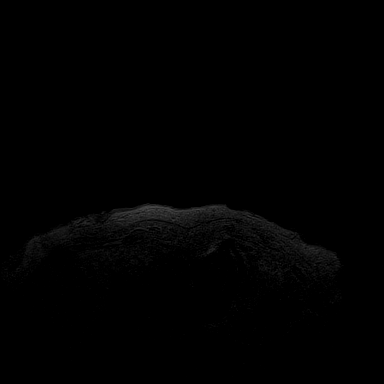
[im 36/144]
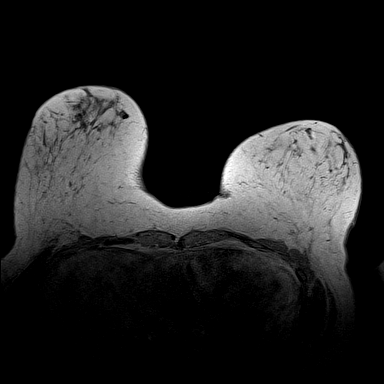
[im 72/144]
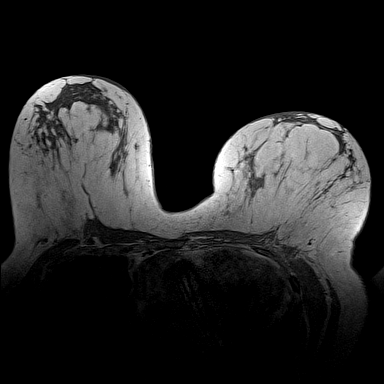
[im 108/144]
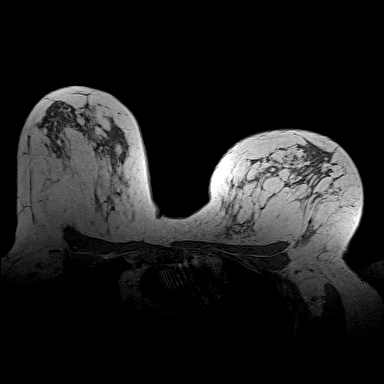
[im 144/144]
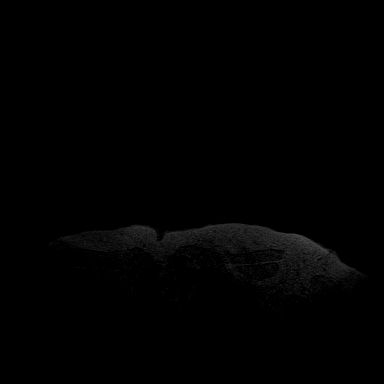

[Series 5: fl3d pre-cm · axial · non-contrast · 1.2mm · 0.96mm/px · z∈[-76,+95]mm · 5 of 144 slices shown]
[im 1/144]
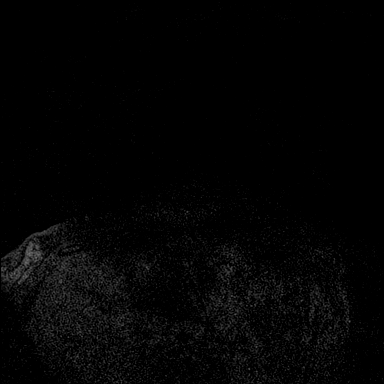
[im 36/144]
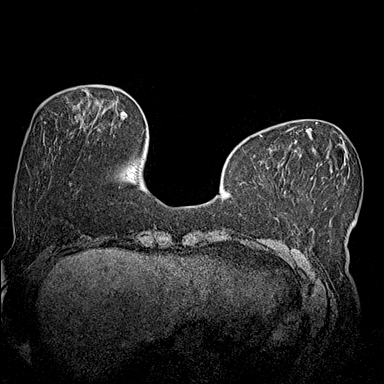
[im 72/144]
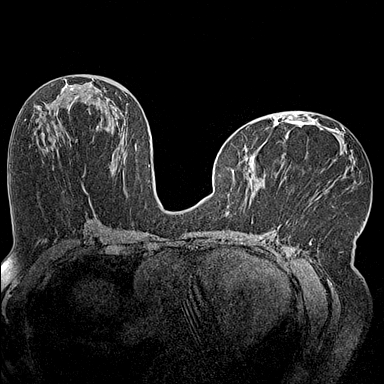
[im 108/144]
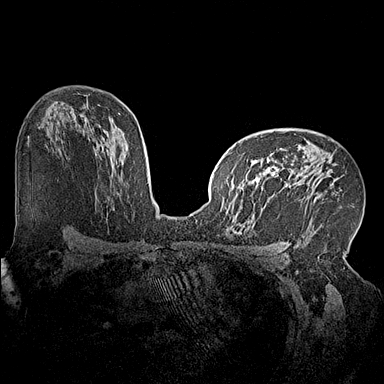
[im 144/144]
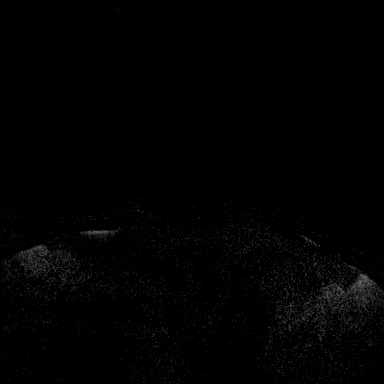

[Series 6: fl3d post-cm 20 · axial · 1.2mm · 0.96mm/px · z∈[-76,+95]mm · 5 of 144 slices shown (1 of 3)]
[im 1/144]
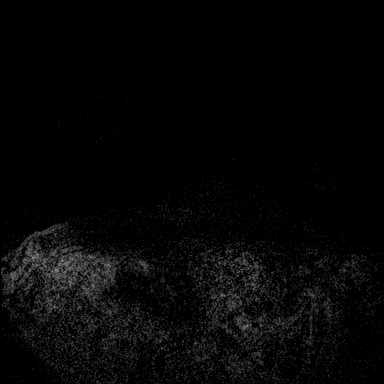
[im 36/144]
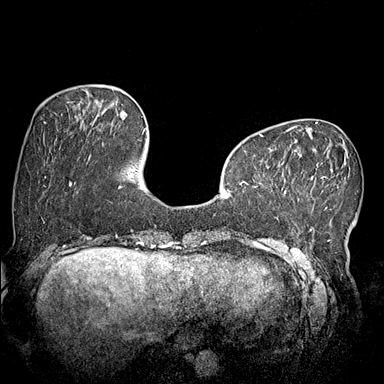
[im 72/144]
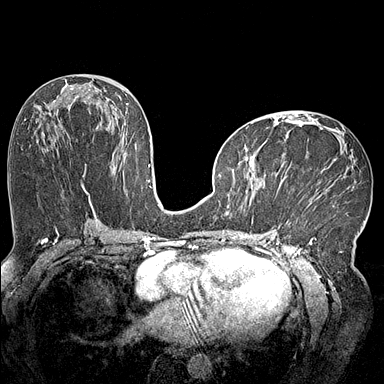
[im 108/144]
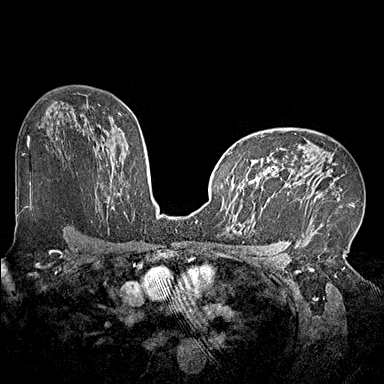
[im 144/144]
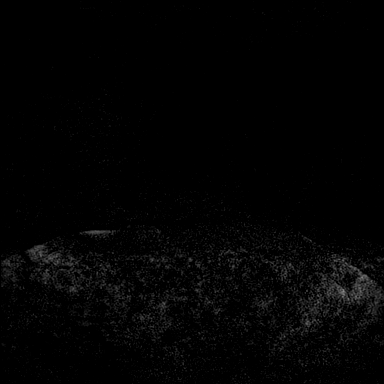

[Series 7: fl3d post-cm 20 · axial · 1.2mm · 0.96mm/px · z∈[-76,+95]mm · 5 of 144 slices shown (2 of 3)]
[im 1/144]
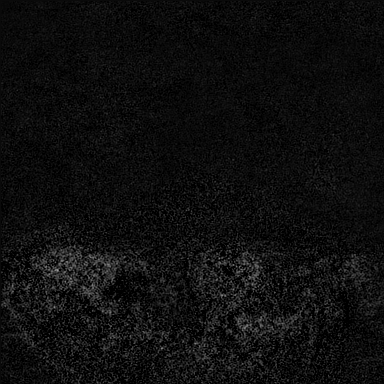
[im 36/144]
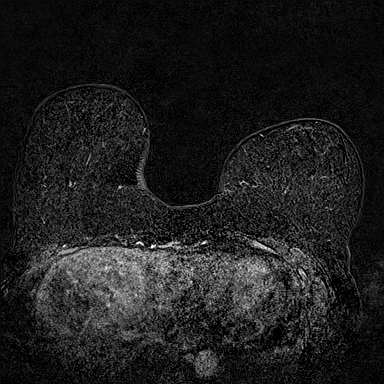
[im 72/144]
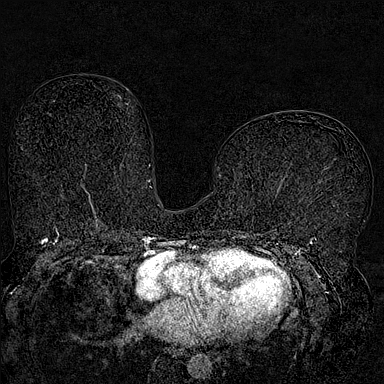
[im 108/144]
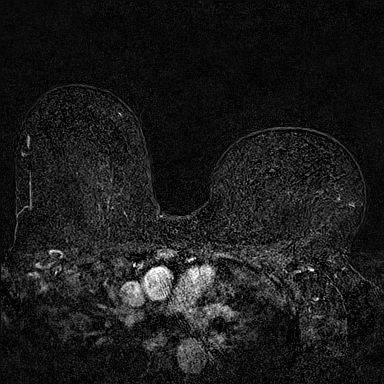
[im 144/144]
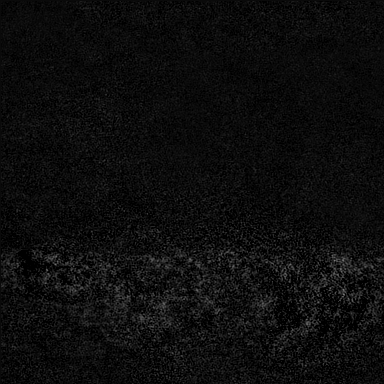

[Series 8: fl3d post-cm 20 · axial · 172.8mm · 0.96mm/px · 1 of 1 slices shown (3 of 3)]
[im 1/1]
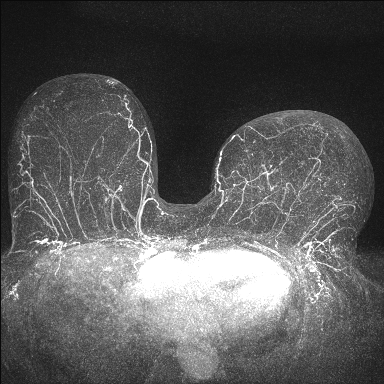

[Series 9: fl3d post-cm 3 · axial · 1.2mm · 0.96mm/px · z∈[-76,+95]mm · 6 of 144 slices shown (1 of 2)]
[im 1/144]
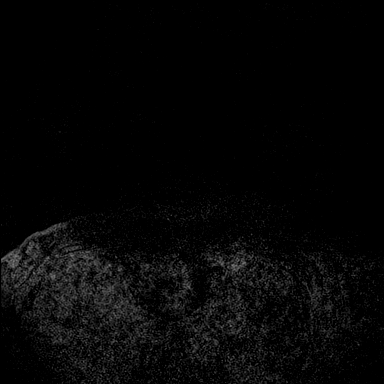
[im 29/144]
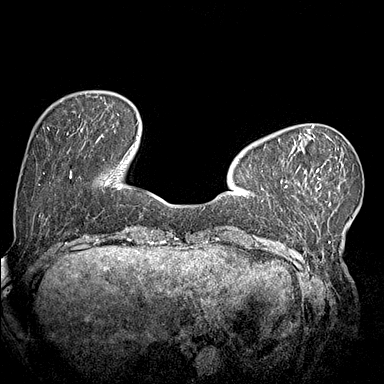
[im 58/144]
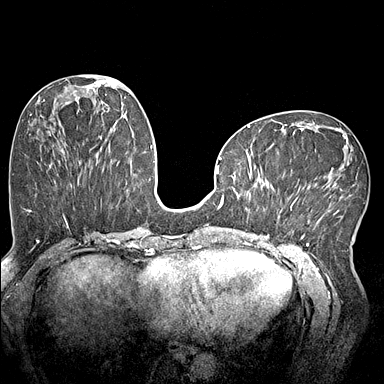
[im 86/144]
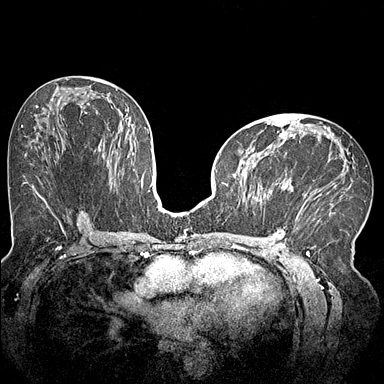
[im 115/144]
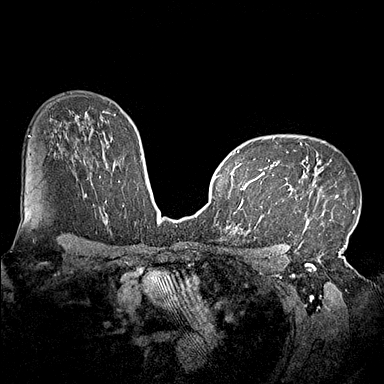
[im 144/144]
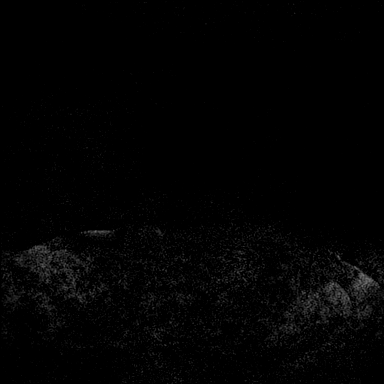

[Series 10: fl3d post-cm 3 · axial · 1.2mm · 0.96mm/px · z∈[-76,-43]mm · 2 of 144 slices shown (2 of 2)]
[im 1/144]
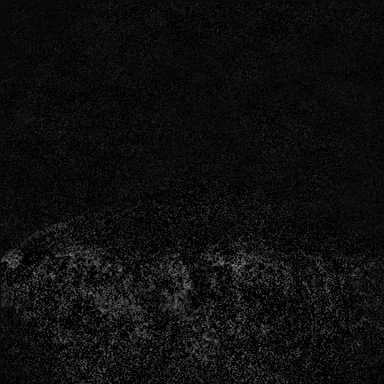
[im 29/144]
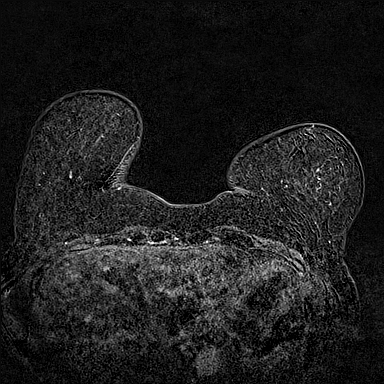

[30 of 48 positions shown; findings below may reference images not displayed]

Three-dimensional MR images were rendered by post-processing of the
original MR data on an independent workstation. The
three-dimensional MR images were interpreted, and findings are
reported in the following complete MRI report for this study. Three
dimensional images were evaluated at the independent interpreting
workstation using the DynaCAD thin client.
FINDINGS: Breast composition: c. Heterogeneous fibroglandular tissue.

Background parenchymal enhancement: Mild.

Right breast: Susceptibility artifact is noted in the upper inner
quadrant of the right breast corresponding with the site of known
cancer (series 6, image 46). There is a 5 mm residual enhancing mass
near the biopsy site on the immediate post contrast images (series
7, image 50). This likely represents the residual malignancy which
has markedly decreased in size to the pre treatment MRI from
[DATE]. On the delayed post-contrast images, there is an area of
non mass enhancement surrounding the biopsy site spanning at least 4
cm (series 13, image 49).

Left breast: No mass or abnormal enhancement.

Lymph nodes: No abnormal appearing lymph nodes.

Ancillary findings:  None.
IMPRESSION: 1. There is near complete resolution of the mass in the upper inner
quadrant of the right breast. There is a persistent area of non mass
enhancement on the delayed postcontrast images spanning at least 4
cm.

2.  No evidence of left breast malignancy.

RECOMMENDATION:
Treatment plan for known right breast cancer.

BI-RADS CATEGORY  6: Known biopsy-proven malignancy.

ADDENDUM:
Correction to the date for the prior MRI in the findings section.
The date was listed as [DATE], which should instead read
[DATE].

*** End of Addendum ***
Three-dimensional MR images were rendered by post-processing of the
original MR data on an independent workstation. The
three-dimensional MR images were interpreted, and findings are
reported in the following complete MRI report for this study. Three
dimensional images were evaluated at the independent interpreting
workstation using the DynaCAD thin client.
FINDINGS: Breast composition: c. Heterogeneous fibroglandular tissue.

Background parenchymal enhancement: Mild.

Right breast: Susceptibility artifact is noted in the upper inner
quadrant of the right breast corresponding with the site of known
cancer (series 6, image 46). There is a 5 mm residual enhancing mass
near the biopsy site on the immediate post contrast images (series
7, image 50). This likely represents the residual malignancy which
has markedly decreased in size to the pre treatment MRI from
[DATE]. On the delayed post-contrast images, there is an area of
non mass enhancement surrounding the biopsy site spanning at least 4
cm (series 13, image 49).

Left breast: No mass or abnormal enhancement.

Lymph nodes: No abnormal appearing lymph nodes.

Ancillary findings:  None.
IMPRESSION: 1. There is near complete resolution of the mass in the upper inner
quadrant of the right breast. There is a persistent area of non mass
enhancement on the delayed postcontrast images spanning at least 4
cm.

2.  No evidence of left breast malignancy.

RECOMMENDATION:
Treatment plan for known right breast cancer.

BI-RADS CATEGORY  6: Known biopsy-proven malignancy.

## 2021-12-10 MED ORDER — GADOBUTROL 1 MMOL/ML IV SOLN
10.0000 mL | Freq: Once | INTRAVENOUS | Status: AC | PRN
  Administered 2021-12-10: 10 mL via INTRAVENOUS

## 2021-12-11 ENCOUNTER — Other Ambulatory Visit: Payer: Self-pay

## 2021-12-11 ENCOUNTER — Encounter: Payer: Self-pay | Admitting: *Deleted

## 2021-12-11 ENCOUNTER — Inpatient Hospital Stay: Payer: BC Managed Care – PPO

## 2021-12-11 VITALS — BP 131/86 | HR 82 | Temp 98.3°F | Resp 18

## 2021-12-11 DIAGNOSIS — C50211 Malignant neoplasm of upper-inner quadrant of right female breast: Secondary | ICD-10-CM

## 2021-12-11 DIAGNOSIS — Z5112 Encounter for antineoplastic immunotherapy: Secondary | ICD-10-CM | POA: Diagnosis not present

## 2021-12-11 MED ORDER — PEGFILGRASTIM-CBQV 6 MG/0.6ML ~~LOC~~ SOSY
6.0000 mg | PREFILLED_SYRINGE | Freq: Once | SUBCUTANEOUS | Status: AC
  Administered 2021-12-11: 6 mg via SUBCUTANEOUS
  Filled 2021-12-11: qty 0.6

## 2021-12-12 ENCOUNTER — Encounter: Payer: Self-pay | Admitting: Oncology

## 2021-12-12 ENCOUNTER — Encounter: Payer: Self-pay | Admitting: Hematology and Oncology

## 2021-12-12 NOTE — Assessment & Plan Note (Signed)
status post right breast upper inner quadrant biopsy 06/03/2021 for a clinical T2N0 invasive ductal carcinoma, grade 3, functionally triple negative, with an MIB-1 of 95%.  Current Treatment: neoadjuvant chemotherapy consisting of carboplatin paclitaxel and pembrolizumab for 4 cycles starting 06/23/2021, to be followed by doxorubicin and cyclophosphamide and pembrolizumab for 4 cycles, with pembrolizumab to be continued to complete a year.  11/04/21-11/06/21: Hospitalization for pneumonia with neutropenia  Plan: 1. Definitive surgery 2. Continuation of Pembrolizumab  3. Adj XRT  _________________________________________________________________  Rita Davis will proceed with chemotherapy today. I reviewed her labs with her today which are stable and within parameters.  She will undergo breast MRI and f/u with Rita Davis after.  Dr. Lindi Adie will see her after breast MRI and Rita Davis f/u.

## 2021-12-14 ENCOUNTER — Other Ambulatory Visit: Payer: Self-pay | Admitting: General Surgery

## 2021-12-14 DIAGNOSIS — C50211 Malignant neoplasm of upper-inner quadrant of right female breast: Secondary | ICD-10-CM

## 2021-12-14 DIAGNOSIS — Z170 Estrogen receptor positive status [ER+]: Secondary | ICD-10-CM

## 2021-12-15 ENCOUNTER — Other Ambulatory Visit: Payer: Self-pay | Admitting: *Deleted

## 2021-12-15 ENCOUNTER — Telehealth: Payer: Self-pay | Admitting: Hematology and Oncology

## 2021-12-15 ENCOUNTER — Encounter (HOSPITAL_BASED_OUTPATIENT_CLINIC_OR_DEPARTMENT_OTHER): Payer: Self-pay | Admitting: General Surgery

## 2021-12-15 ENCOUNTER — Other Ambulatory Visit: Payer: Self-pay

## 2021-12-15 ENCOUNTER — Telehealth: Payer: Self-pay

## 2021-12-15 DIAGNOSIS — Z171 Estrogen receptor negative status [ER-]: Secondary | ICD-10-CM

## 2021-12-15 NOTE — Telephone Encounter (Signed)
Sch per 2/14 inbasket, pt aware °

## 2021-12-15 NOTE — Telephone Encounter (Signed)
Called Patient to discuss Cancer Claim form received on 11/27/21. Patient answered the phone "What, What [employer] [facility] Want?" Explained to Patient that call was in reference to Cancer Claim Form received on 11/27/21. Patient stated " Well, you called me, What [employer] [facility] Want?" Patient hung the phone up. Unable to ask questions regarding Cancer Claim Form t this time.

## 2021-12-16 ENCOUNTER — Other Ambulatory Visit: Payer: Self-pay | Admitting: General Surgery

## 2021-12-16 ENCOUNTER — Encounter: Payer: Self-pay | Admitting: *Deleted

## 2021-12-16 ENCOUNTER — Encounter: Payer: Self-pay | Admitting: Rehabilitation

## 2021-12-16 ENCOUNTER — Ambulatory Visit: Payer: BC Managed Care – PPO | Attending: Hematology and Oncology | Admitting: Rehabilitation

## 2021-12-16 DIAGNOSIS — R293 Abnormal posture: Secondary | ICD-10-CM | POA: Insufficient documentation

## 2021-12-16 DIAGNOSIS — C50211 Malignant neoplasm of upper-inner quadrant of right female breast: Secondary | ICD-10-CM | POA: Insufficient documentation

## 2021-12-16 DIAGNOSIS — Z170 Estrogen receptor positive status [ER+]: Secondary | ICD-10-CM

## 2021-12-16 DIAGNOSIS — Z171 Estrogen receptor negative status [ER-]: Secondary | ICD-10-CM | POA: Insufficient documentation

## 2021-12-16 NOTE — Therapy (Signed)
OUTPATIENT PHYSICAL THERAPY BREAST CANCER BASELINE EVALUATION   Patient Name: Rita Davis MRN: [id number] DOB:August 28, 1961, 61 y.o., female Today's Date: 12/16/2021    Past Medical History:  Diagnosis Date   Anemia    had a fibroid tumor, was anemic at that time   Anxiety    Cancer [facility])    right breast IDC   Family history of breast cancer    Family history of colon cancer    Family history of prostate cancer    Hyperlipidemia    Hypertension    Pneumonia 11/2021   hospitalized   Pre-diabetes    Past Surgical History:  Procedure Laterality Date   ABDOMINAL HYSTERECTOMY     still has ovaries   BREAST BIOPSY Right 05/23/2012   BREAST CYST EXCISION Right    cyst removed     from lower back   DILATION AND CURETTAGE OF UTERUS     ORIF ANKLE FRACTURE Left 11/13/2020   Procedure: OPEN TREATMENT OF LEFT TRIMALLEOLAR ANKLE FRACTURE WITH POSTERIOR FIXATION, SYNDESMOSIS;  Surgeon: Erle Crocker, MD;  Location: [facility];  Service: Orthopedics;  Laterality: Left;  LENGTH OF SURGERY: 1.5 HOURS   PORTACATH PLACEMENT Left 06/18/2021   Procedure: PORT PLACEMENT;  Surgeon: Stark Klein, MD;  Location: [facility];  Service: General;  Laterality: Left;   Patient Active Problem List   Diagnosis Date Noted   Bilateral pneumonia 11/05/2021   Pneumonia of both lower lobes due to infectious organism 11/04/2021   Genetic testing 07/10/2021   Port-A-Cath in place 06/24/2021   Family history of breast cancer 06/09/2021   Family history of prostate cancer 06/09/2021   Family history of colon cancer 06/09/2021   Malignant neoplasm of upper-inner quadrant of right breast in female, estrogen receptor negative ([place]) 06/09/2021   Pneumonia 03/21/2018   Fibroid tumor 03/10/2018   Adjustment insomnia 11/26/2015   Eczema 11/26/2015   Other specified abnormal findings of blood chemistry 11/26/2015   Healthcare maintenance 04/10/2014   Anxiety state 02/28/2014   OVERWEIGHT  03/27/2009   Overweight 03/27/2009   HYPERLIPIDEMIA 01/24/2009   ANEMIA 11/20/2007   HYPERTENSION 11/20/2007    PCP: Jenel Lucks, PA-C  REFERRING PROVIDER: Nicholas Lose, MD  REFERRING DIAG: Right breast cancer  THERAPY DIAG:  Malignant neoplasm of upper-inner quadrant of right breast in female, estrogen receptor negative ([place])  Abnormal posture  ONSET DATE: 06/03/21  SUBJECTIVE                                                                                                                                                                                           SUBJECTIVE  STATEMENT: Patient reports she is here today to be seen by her medical team for her newly diagnosed right breast cancer.   PERTINENT HISTORY:  Initial Diagnosis 06/03/21 of Rt breast cancer. Neo-adjuvant chemotherapy started 06/23/21 of Carboplatin/Paclitaxel/Keytruda followed by Cyclophosphamide and Keytruda. Pt will have Rt lumpectomy and SLNB on 12/21/21 with Dr. Barry Dienes, and then radiation.   PATIENT GOALS   reduce lymphedema risk and learn post op HEP.   PAIN:  Are you having pain? No  PRECAUTIONS: Active CA   HAND DOMINANCE: right  WEIGHT BEARING RESTRICTIONS No  FALLS:  Has patient fallen in last 6 months? No,  LIVING ENVIRONMENT: Patient lives with: Alone Lives in: House/apartment Has following equipment at home: None  OCCUPATION: retired  Psychologist, clinical: traveling, walking, pilates   PRIOR LEVEL OF FUNCTION: Independent   OBJECTIVE  COGNITION:  Overall cognitive status: Within functional limits for tasks assessed    POSTURE:  Forward head and rounded shoulders posture  UPPER EXTREMITY AROM/PROM:  A/PROM RIGHT  12/16/2021   Shoulder extension 65  Shoulder flexion 170  Shoulder abduction 170  Shoulder internal rotation 70  Shoulder external rotation 100    (Blank rows = not tested)    UPPER EXTREMITY STRENGTH: WNL   L-DEX LYMPHEDEMA SCREENING:  The patient was  assessed using the L-Dex machine today to produce a lymphedema index baseline score. The patient will be reassessed on a regular basis (typically every 3 months) to obtain new L-Dex scores. If the score is > 6.5 points away from his/her baseline score indicating onset of subclinical lymphedema, it will be recommended to wear a compression garment for 4 weeks, 12 hours per day and then be reassessed. If the score continues to be > 6.5 points from baseline at reassessment, we will initiate lymphedema treatment. Assessing in this manner has a 95% rate of preventing clinically significant lymphedema.    QUICK DASH SURVEY: 6.8%   PATIENT EDUCATION:  Education details: Lymphedema risk reduction and post op shoulder/posture HEP Person educated: Patient Education method: Explanation, Demonstration, Handout Education comprehension: Patient verbalized understanding and returned demonstration   HOME EXERCISE PROGRAM: Patient was instructed today in a home exercise program today for post op shoulder range of motion. These included active assist shoulder flexion in sitting, scapular retraction, wall walking with shoulder abduction, and hands behind head external rotation.  She was encouraged to do these twice a day, holding 3 seconds and repeating 5 times when permitted by her physician.   ASSESSMENT:  CLINICAL IMPRESSION: Pt is planning to have a Rt lumpectomy and SLNB. She will benefit from a post op PT reassessment to determine needs and from L-Dex screens every 3 months for 2 years to detect subclinical lymphedema.  Pt will benefit from skilled therapeutic intervention to improve on the following deficits: Decreased knowledge of precautions, impaired UE functional use, pain, decreased ROM, postural dysfunction.   PT treatment/interventions: ADL/self-care home management, pt/family education, therapeutic exercise  REHAB POTENTIAL: Excellent  CLINICAL DECISION MAKING:  Stable/uncomplicated  EVALUATION COMPLEXITY: Low   GOALS: Goals reviewed with patient? YES  LONG TERM GOALS: (STG=LTG)   Name Target Date Goal status  1 Pt will be able to verbalize understanding of pertinent lymphedema risk reduction practices relevant to her dx specifically related to skin care.  Baseline:  No knowledge 12/16/2021 Achieved at eval  2 Pt will be able to return demo and/or verbalize understanding of the post op HEP related to regaining shoulder ROM. Baseline:  No knowledge 12/16/2021 Achieved at eval  3 Pt will be able to verbalize understanding of the importance of attending the post op After Breast CA Class for further lymphedema risk reduction education and therapeutic exercise.  Baseline:  No knowledge 12/16/2021 Achieved at eval  4 Pt will demo she has regained full shoulder ROM and function post operatively compared to baselines.  Baseline: See objective measurements taken today. 01/18/22      PLAN: PT FREQUENCY/DURATION: EVAL and 1 follow up appointment.   PLAN FOR NEXT SESSION: will reassess 3-4 weeks post op to determine needs.   Patient will follow up at outpatient cancer rehab 3-4 weeks following surgery.  If the patient requires physical therapy at that time, a specific plan will be dictated and sent to the referring physician for approval. The patient was educated today on appropriate basic range of motion exercises to begin post operatively and the importance of attending the After Breast Cancer class following surgery.  Patient was educated today on lymphedema risk reduction practices as it pertains to recommendations that will benefit the patient immediately following surgery.  She verbalized good understanding.    Physical Therapy Information for After Breast Cancer Surgery/Treatment:  Lymphedema is a swelling condition that you may be at risk for in your arm if you have lymph nodes removed from the armpit area.  After a sentinel node biopsy, the risk is  approximately 5-9% and is higher after an axillary node dissection.  There is treatment available for this condition and it is not life-threatening.  Contact your physician or physical therapist with concerns. You may begin the 4 shoulder/posture exercises (see additional sheet) when permitted by your physician (typically a week after surgery).  If you have drains, you may need to wait until those are removed before beginning range of motion exercises.  A general recommendation is to not lift your arms above shoulder height until drains are removed.  These exercises should be done to your tolerance and gently.  This is not a "no pain/no gain" type of recovery so listen to your body and stretch into the range of motion that you can tolerate, stopping if you have pain.  If you are having immediate reconstruction, ask your plastic surgeon about doing exercises as he or she may want you to wait. We encourage you to attend the free one time ABC (After Breast Cancer) class offered by [facility].  You will learn information related to lymphedema risk, prevention and treatment and additional exercises to regain mobility following surgery.  You can call [phone] for more information.  This is offered the 1st and 3rd Monday of each month.  You only attend the class one time. While undergoing any medical procedure or treatment, try to avoid blood pressure being taken or needle sticks from occurring on the arm on the side of cancer.   This recommendation begins after surgery and continues for the rest of your life.  This may help reduce your risk of getting lymphedema (swelling in your arm). An excellent resource for those seeking information on lymphedema is the National Lymphedema Network's web site. It can be accessed at [place].org If you notice swelling in your hand, arm or breast at any time following surgery (even if it is many years from now), please contact your doctor or physical  therapist to discuss this.  Lymphedema can be treated at any time but it is easier for you if it is treated early on.  If you feel like your shoulder motion is not  returning to normal in a reasonable amount of time, please contact your surgeon or physical therapist.  [facility] [phone]. [address], [address], [place] [address]  ABC CLASS After Breast Cancer Class  After Breast Cancer Class is a specially designed exercise class to assist you in a safe recover after having breast cancer surgery.  In this class you will learn how to get back to full function whether your drains were just removed or if you had surgery a month ago.  This one-time class is held the 1st and 3rd Monday of every month from 11:00 a.m. until 12:00 noon virtually.  This class is FREE and space is limited. For more information or to register for the next available class, call [phone].  Class Goals  Understand specific stretches to improve the flexibility of you chest and shoulder. Learn ways to safely strengthen your upper body and improve your posture. Understand the warning signs of infection and why you may be at risk for an arm infection. Learn about Lymphedema and prevention.  ** You do not attend this class until after surgery.  Drains must be removed to participate  Patient was instructed today in a home exercise program today for post op shoulder range of motion. These included active assist shoulder flexion in sitting, scapular retraction, wall walking with shoulder abduction, and hands behind head external rotation.  She was encouraged to do these twice a day, holding 3 seconds and repeating 5 times when permitted by her physician.    Stark Bray, PT 12/16/2021, 8:07 AM

## 2021-12-16 NOTE — Progress Notes (Signed)
Patient Care Team: Rita Lucks, PA-C as PCP - General (Internal Medicine) Rita Germany, RN as Oncology Nurse Navigator Rita Kaufmann, RN as Oncology Nurse Navigator Rita Klein, MD as Consulting Physician (General Surgery) Rita Lose, MD as Consulting Physician (Hematology and Oncology)  DIAGNOSIS:    ICD-10-CM   1. Malignant neoplasm of upper-inner quadrant of right breast in female, estrogen receptor negative ([place])  C50.211    Z17.1       SUMMARY OF ONCOLOGIC HISTORY: Oncology History  Malignant neoplasm of upper-inner quadrant of right breast in female, estrogen receptor negative ([place])  06/03/2021 Initial Diagnosis   Cross woman status post right breast upper inner quadrant biopsy 06/03/2021 for a clinical T2N0 invasive ductal carcinoma, grade 3, functionally triple negative, with an MIB-1 of 95%.   06/09/2021 Cancer Staging   Staging form: Breast, AJCC 8th Edition - Clinical: Stage IIB (cT2, cN0, cM0, G3, ER-, PR-, HER2-) - Signed by Rita Cruel, MD on 06/09/2021 Histologic grading system: 3 grade system    06/23/2021 -  Neo-Adjuvant Chemotherapy   neoadjuvant chemotherapy consisting of carboplatin paclitaxel and pembrolizumab for 4 cycles starting 06/23/2021, to be followed by doxorubicin and cyclophosphamide and pembrolizumab for 4 cycles, with pembrolizumab to be continued to complete a year   06/30/2021 Genetic Testing   Patient has genetic testing done for personal history of breast cancer. No pathogenic variants were detected. Of note, a variant of uncertain significance was detected in the BRIP1 and TSC2 genes.  The genes analyzed include: APC, ATM, AXIN2, BARD1, BMPR1A, BRCA1, BRCA2, BRIP1, CDH1, CDK4*, CDKN2A (p14ARF)*, CDKN2A (p16INK4a)*, CHEK2, CTNNA1, DICER1, EPCAM (Deletion/duplication testing only), GREM1 (promoter region deletion/duplication testing only), KIT, MEN1, MLH1, MSH2, MSH3, MSH6, MUTYH, NBN, NF1, NHTL1, PALB2, PDGFRA*,  PMS2, POLD1, POLE, PTEN, RAD50, RAD51C, RAD51D, SDHB, SDHC, SDHD, SMAD4, SMARCA4. STK11, TP53, TSC1, TSC2, and VHL.  The following genes were evaluated for sequence changes only: SDHA and HOXB13 c.251G>A variant only.    11/04/2021 - 11/06/2021 Hospital Admission   Neutropenia related pneumonia     CHIEF COMPLIANT: Follow-up of triple negative breast cancer  INTERVAL HISTORY: Rita Davis is a 61 y.o. with above-mentioned history of triple negative breast cancer, currently on chemotherapy with Adriamycin, Cytoxan, Keytruda. She presents to the clinic today for follow-up.   ALLERGIES:  has No Known Allergies.  MEDICATIONS:  Current Outpatient Medications  Medication Sig Dispense Refill   acetaminophen (TYLENOL) 500 MG tablet Take 1,000 mg by mouth every 6 (six) hours as needed for mild pain.     amLODipine-olmesartan (AZOR) 5-40 MG tablet Take 1 tablet by mouth in the morning.     Cholecalciferol (VITAMIN D3) 250 MCG (10000 UT) TABS Take 10,000 Units by mouth daily.     dexamethasone (DECADRON) 4 MG tablet Take 2 tablets once a day for 3 days after carboplatin and AC chemotherapy. Take with food. (Patient taking differently: Take 8 mg by mouth See admin instructions. Take 8 mg once a day for 3 days after carboplatin and AC chemotherapy. Take with food.) 30 tablet 1   lidocaine-prilocaine (EMLA) cream Apply to affected area once (Patient taking differently: Apply 1 application topically daily as needed (port site access).) 30 g 3   Misc Natural Products (SAMBUCUS ELDERBERRY IMMUNE PO) Take 5 mLs by mouth daily.     Misc Natural Products (SUPER GREENS) POWD Take 1 Scoop by mouth daily.     prochlorperazine (COMPAZINE) 10 MG tablet Take 1 tablet (10 mg total) by mouth  every 6 (six) hours as needed (Nausea or vomiting). 30 tablet 1   rosuvastatin (CRESTOR) 20 MG tablet Take 1 tablet (20 mg total) by mouth daily.     No current facility-administered medications for this visit.    PHYSICAL  EXAMINATION: ECOG PERFORMANCE STATUS: 1 - Symptomatic but completely ambulatory  Vitals:   12/17/21 0849  BP: 138/82  Pulse: 92  Resp: 19  Temp: 97.8 F (36.6 C)  SpO2: 98%   Filed Weights   12/17/21 0849  Weight: 184 lb (83.5 kg)     LABORATORY DATA:  I have reviewed the data as listed CMP Latest Ref Rng & Units 12/09/2021 11/18/2021 11/05/2021  Glucose 70 - 99 mg/dL 107(H) 113(H) 101(H)  BUN 6 - 20 mg/dL '16 14 12  ' Creatinine 0.44 - 1.00 mg/dL 0.81 0.84 0.65  Sodium 135 - 145 mmol/L 142 139 137  Potassium 3.5 - 5.1 mmol/L 3.7 3.9 4.0  Chloride 98 - 111 mmol/L 109 105 105  CO2 22 - 32 mmol/L '28 28 26  ' Calcium 8.9 - 10.3 mg/dL 9.1 9.4 8.9  Total Protein 6.5 - 8.1 g/dL 6.4(L) 6.9 6.5  Total Bilirubin 0.3 - 1.2 mg/dL 0.2(L) 0.2(L) 0.7  Alkaline Phos 38 - 126 U/L 71 74 69  AST 15 - 41 U/L 20 22 14(L)  ALT 0 - 44 U/L '21 22 20    ' Lab Results  Component Value Date   WBC 13.4 (H) 12/17/2021   HGB 9.9 (L) 12/17/2021   HCT 29.3 (L) 12/17/2021   MCV 86.2 12/17/2021   PLT 207 12/17/2021   NEUTROABS PENDING 12/17/2021    ASSESSMENT & PLAN:  Malignant neoplasm of upper-inner quadrant of right breast in female, estrogen receptor negative ([place]) status post right breast upper inner quadrant biopsy 06/03/2021 for a clinical T2N0 invasive ductal carcinoma, grade 3, functionally triple negative, ER 5%, PR 0%, HER2 2+ by IHC FISH negative, Ki-67 95%    Treatment plan:  1. Neoadjuvant chemotherapy consisting of carboplatin paclitaxel and pembrolizumab for 4 cycles starting 06/23/2021, to be followed by doxorubicin and cyclophosphamide and pembrolizumab for 4 cycles completed 12/09/2021, with pembrolizumab to be continued to complete a year.   2. breast conserving surgery with sentinel lymph node biopsy 3.  Adjuvant radiation therapy  ------------------------------------------------------------------------------------------------------------------------------ Chemo toxicities: 11/04/21-11/06/21:  Hospitalization for pneumonia with neutropenia 2. chemotherapy-induced anemia  breast MRI on 12/10/2021: Near complete resolution of the mass, persistent area of non-mass enhancement spanning 4 cm    Rita Davis will continue every 3 weeks..  She has booked vacation in [place] in April around the 12th Return to clinic after surgery to discuss the pathology report  No orders of the defined types were placed in this encounter.  The patient has a good understanding of the overall plan. she agrees with it. she will call with any problems that may develop before the next visit here.  Total time spent: 30 mins including face to face time and time spent for planning, charting and coordination of care  Rulon Eisenmenger, MD, MPH 12/17/2021 Juel Burrow, am acting as scribe for Dr. Nicholas Davis.  I have reviewed the above documentation for accuracy and completeness, and I agree with the above.

## 2021-12-17 ENCOUNTER — Telehealth: Payer: Self-pay | Admitting: *Deleted

## 2021-12-17 ENCOUNTER — Other Ambulatory Visit: Payer: Self-pay

## 2021-12-17 ENCOUNTER — Inpatient Hospital Stay: Payer: BC Managed Care – PPO

## 2021-12-17 ENCOUNTER — Inpatient Hospital Stay: Payer: BC Managed Care – PPO | Admitting: Hematology and Oncology

## 2021-12-17 ENCOUNTER — Other Ambulatory Visit: Payer: Self-pay | Admitting: *Deleted

## 2021-12-17 DIAGNOSIS — Z171 Estrogen receptor negative status [ER-]: Secondary | ICD-10-CM

## 2021-12-17 DIAGNOSIS — Z5112 Encounter for antineoplastic immunotherapy: Secondary | ICD-10-CM | POA: Diagnosis not present

## 2021-12-17 DIAGNOSIS — C50211 Malignant neoplasm of upper-inner quadrant of right female breast: Secondary | ICD-10-CM

## 2021-12-17 LAB — CBC WITH DIFFERENTIAL (CANCER CENTER ONLY)
Abs Immature Granulocytes: 0.85 10*3/uL — ABNORMAL HIGH (ref 0.00–0.07)
Basophils Absolute: 0 10*3/uL (ref 0.0–0.1)
Basophils Relative: 0 %
Eosinophils Absolute: 0.3 10*3/uL (ref 0.0–0.5)
Eosinophils Relative: 2 %
HCT: 29.3 % — ABNORMAL LOW (ref 36.0–46.0)
Hemoglobin: 9.9 g/dL — ABNORMAL LOW (ref 12.0–15.0)
Immature Granulocytes: 6 %
Lymphocytes Relative: 8 %
Lymphs Abs: 1.1 10*3/uL (ref 0.7–4.0)
MCH: 29.1 pg (ref 26.0–34.0)
MCHC: 33.8 g/dL (ref 30.0–36.0)
MCV: 86.2 fL (ref 80.0–100.0)
Monocytes Absolute: 0.8 10*3/uL (ref 0.1–1.0)
Monocytes Relative: 6 %
Neutro Abs: 10.3 10*3/uL — ABNORMAL HIGH (ref 1.7–7.7)
Neutrophils Relative %: 78 %
Platelet Count: 207 10*3/uL (ref 150–400)
RBC: 3.4 MIL/uL — ABNORMAL LOW (ref 3.87–5.11)
RDW: 18.6 % — ABNORMAL HIGH (ref 11.5–15.5)
Smear Review: NORMAL
WBC Count: 13.4 10*3/uL — ABNORMAL HIGH (ref 4.0–10.5)
nRBC: 0 % (ref 0.0–0.2)

## 2021-12-17 LAB — CMP (CANCER CENTER ONLY)
ALT: 21 U/L (ref 0–44)
AST: 15 U/L (ref 15–41)
Albumin: 3.9 g/dL (ref 3.5–5.0)
Alkaline Phosphatase: 113 U/L (ref 38–126)
Anion gap: 6 (ref 5–15)
BUN: 16 mg/dL (ref 8–23)
CO2: 28 mmol/L (ref 22–32)
Calcium: 9.3 mg/dL (ref 8.9–10.3)
Chloride: 104 mmol/L (ref 98–111)
Creatinine: 0.76 mg/dL (ref 0.44–1.00)
GFR, Estimated: >60 mL/min (ref >60)
Glucose, Bld: 108 mg/dL — ABNORMAL HIGH (ref 70–99)
Potassium: 3.7 mmol/L (ref 3.5–5.1)
Sodium: 138 mmol/L (ref 135–145)
Total Bilirubin: 0.2 mg/dL — ABNORMAL LOW (ref 0.3–1.2)
Total Protein: 6.4 g/dL — ABNORMAL LOW (ref 6.5–8.1)

## 2021-12-17 LAB — TSH: TSH: 1.255 u[IU]/mL (ref 0.308–3.960)

## 2021-12-17 MED ORDER — SODIUM CHLORIDE 0.9% FLUSH
10.0000 mL | INTRAVENOUS | Status: AC | PRN
  Administered 2021-12-17: 10 mL

## 2021-12-17 MED ORDER — HEPARIN SOD (PORK) LOCK FLUSH 100 UNIT/ML IV SOLN
500.0000 [IU] | INTRAVENOUS | Status: AC | PRN
  Administered 2021-12-17: 500 [IU]

## 2021-12-17 NOTE — Assessment & Plan Note (Signed)
status post right breast upper inner quadrant biopsy 06/03/2021 for a clinical T2N0 invasive ductal carcinoma, grade 3, functionally triple negative, ER 5%, PR 0%, HER2 2+ by IHC FISH negative, Ki-67 95%   Treatment plan:  1. Neoadjuvant chemotherapy consisting of carboplatin paclitaxel and pembrolizumab for 4 cycles starting 06/23/2021, to be followed by doxorubicin and cyclophosphamide and pembrolizumab for 4 cycles completed 12/09/2021, with pembrolizumab to be continued to complete a year.   2. breast conserving surgery with sentinel lymph node biopsy 3.  Adjuvant radiation therapy  ------------------------------------------------------------------------------------------------------------------------------ Chemo toxicities: 1. 11/04/21-11/06/21: Hospitalization for pneumonia with neutropenia 2. chemotherapy-induced anemia  breast MRI on 12/10/2021: Near complete resolution of the mass, persistent area of non-mass enhancement spanning 4 cm   Return to clinic after surgery to discuss final pathology report. [name] will be on hold until she returns back from [place] vacation.

## 2021-12-17 NOTE — Telephone Encounter (Signed)
Received required [place] for Use and Disclosure today with itemized billing information brought in by Aynsley Duddy Included note to "Return form to fax no.# [phone] for her three [place] ([place])."  No policy numbers provided. Connected with Coila Yarborough in exam room no. # 34 for clarification of additional form needs and policy numbers. "Send everything to [place] to sort and apply information to the correct policy."  Report of pathology required to accompany claim.  Apologized for processing delay and probable loss of form delivered 11/04/2021.  Best practice is to deliver forms to entry registration with required ROI and cover sheet which assist [place] complete form process. "Don't feel bad.  Since you explained how to deliver forms everything has gone well.  You do a great job and form was actually delivered January 8th."    The [employer] claim successfully faxed to [phone] at [phone] included pathology and patient supplied billing information. Unable to connect with Reaghan Cavan no longer in exam room, main entrance lobby or [place] area.    Original copy mailed to patient patient address on file. [place] [phone]  Late entries: 12/15/2021 [facility] voicemail)  "Call Aikam Feasel [phone]) regarding the status of my claim?  I expect to pick up completed form on next scheduled visit 12/17/2021 or will bring in a FOURTH copy to be completed."  12/02/2020 "Octivia Jimmye Norman Calling to check status of forms.  February 4th will be a month since I dropped it off.  I"ve brought a form in three times."  Form received 11/27/2021.

## 2021-12-18 ENCOUNTER — Ambulatory Visit
Admission: RE | Admit: 2021-12-18 | Discharge: 2021-12-18 | Disposition: A | Discharge status: Home / Self Care | Payer: BC Managed Care – PPO | Source: Ambulatory Visit | Attending: General Surgery | Admitting: General Surgery

## 2021-12-18 ENCOUNTER — Telehealth: Payer: Self-pay | Admitting: Hematology and Oncology

## 2021-12-18 ENCOUNTER — Encounter (HOSPITAL_BASED_OUTPATIENT_CLINIC_OR_DEPARTMENT_OTHER)
Admission: RE | Admit: 2021-12-18 | Discharge: 2021-12-18 | Disposition: A | Discharge status: Home / Self Care | Payer: BC Managed Care – PPO | Source: Ambulatory Visit | Attending: General Surgery | Admitting: General Surgery

## 2021-12-18 DIAGNOSIS — Z170 Estrogen receptor positive status [ER+]: Secondary | ICD-10-CM

## 2021-12-18 DIAGNOSIS — C50211 Malignant neoplasm of upper-inner quadrant of right female breast: Secondary | ICD-10-CM

## 2021-12-18 LAB — T4: T4, Total: 6.8 ug/dL (ref 4.5–12.0)

## 2021-12-18 IMAGING — MG MM PLC BREAST LOC DEV 1ST LESION INC*R*
7 series · 7 of 7 positions shown · non-contrast
Comparison: Previous exam(s).

CLINICAL DATA: 61-year-old with biopsy-proven triple negative grade
3 invasive ductal carcinoma involving the UPPER INNER QUADRANT of
the RIGHT breast (associated with a coil shaped tissue marker clip).
The patient underwent neoadjuvant chemotherapy with a complete
imaging response to treatment. She presents now for radioactive seed
localization in anticipation of lumpectomy which is scheduled for
[REDACTED], [DATE].

Prior benign core needle biopsy of the RIGHT breast in [AB],
pathology fibroadenoma (associated with a ribbon shaped tissue
marker clip).
EXAM:
MAMMOGRAPHIC GUIDED RADIOACTIVE SEED LOCALIZATION OF THE RIGHT
BREAST

[R CC (1 of 3)]
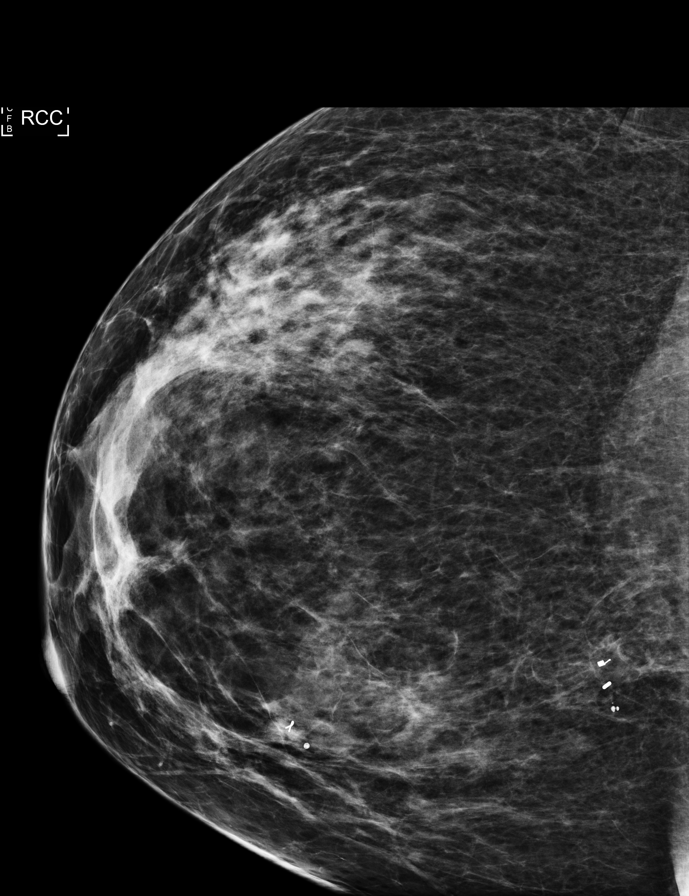

[R ML (1 of 4)]
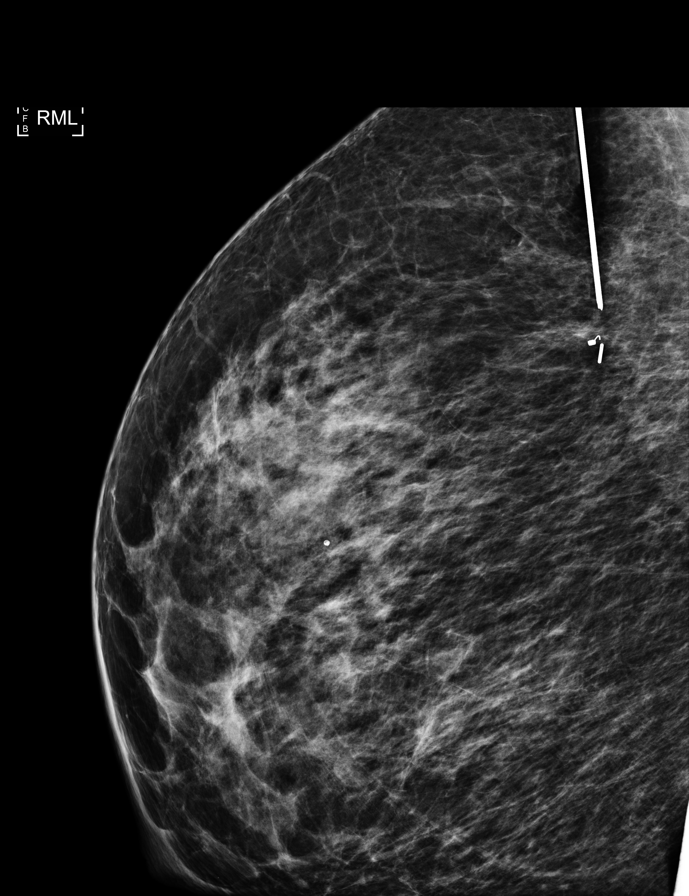

[R CC (2 of 3)]
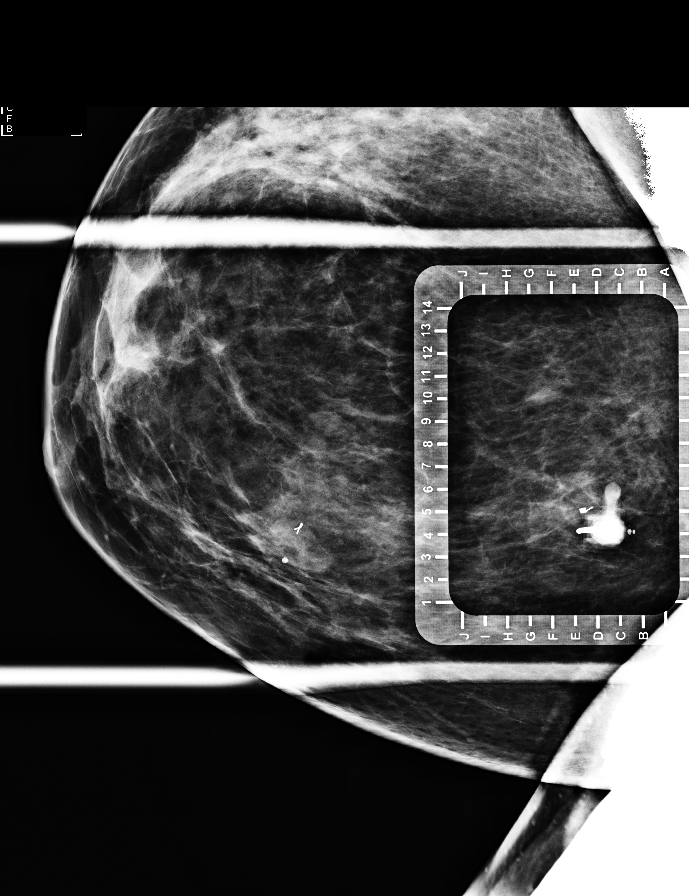

[R ML (2 of 4)]
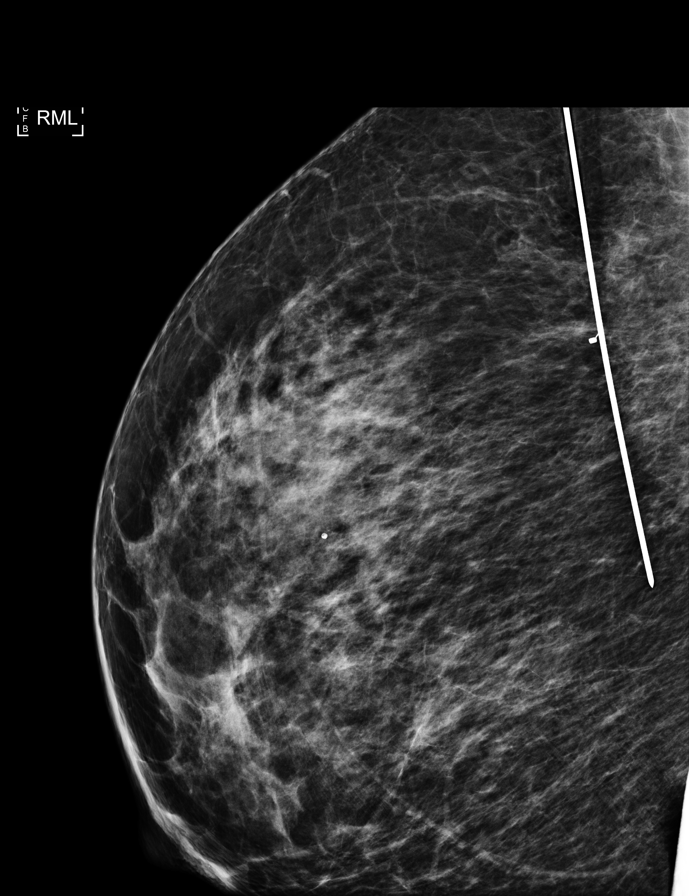

[R CC (3 of 3)]
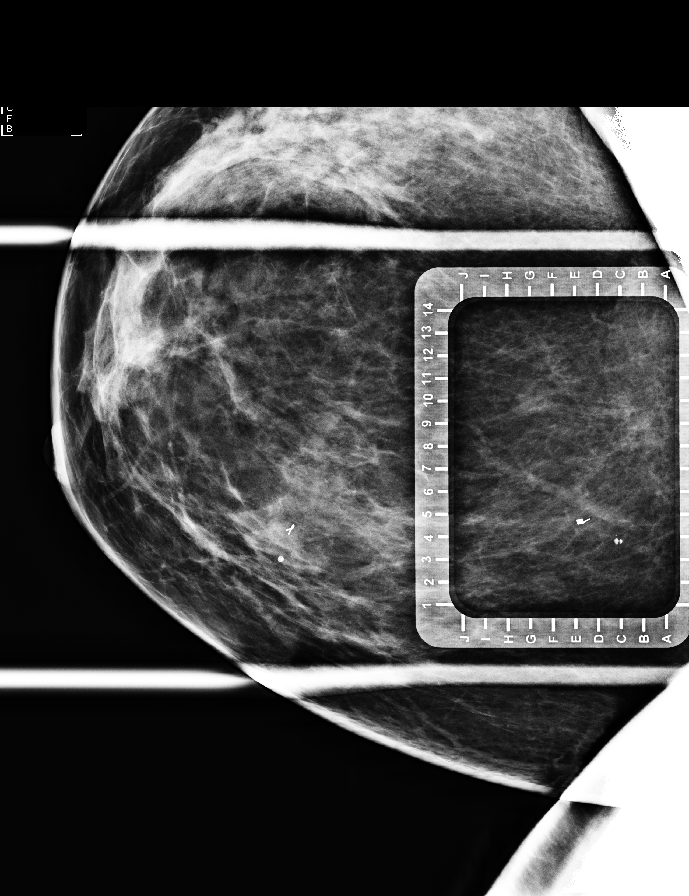

[R ML (3 of 4)]
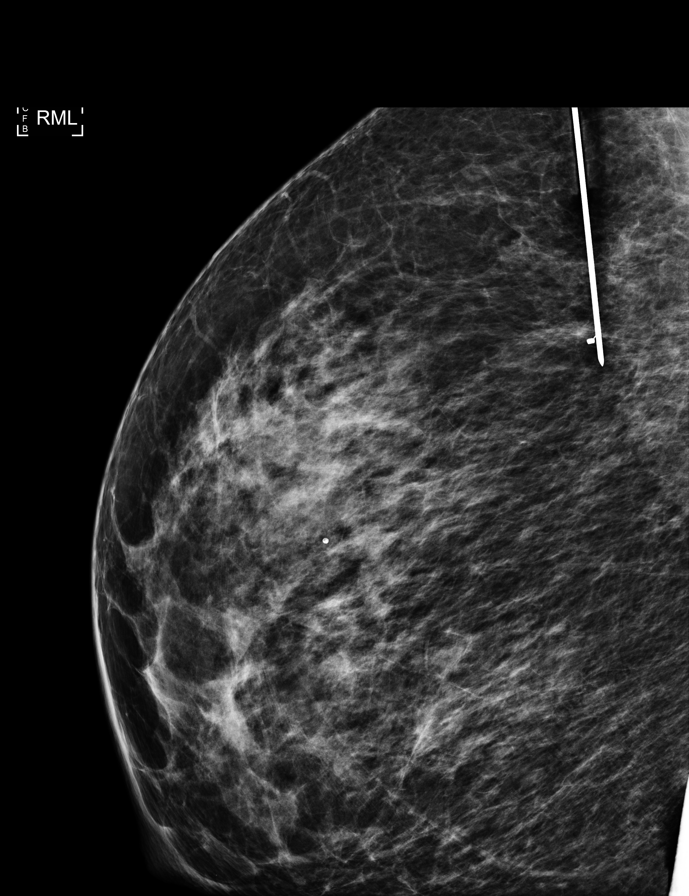

[R ML (4 of 4)]
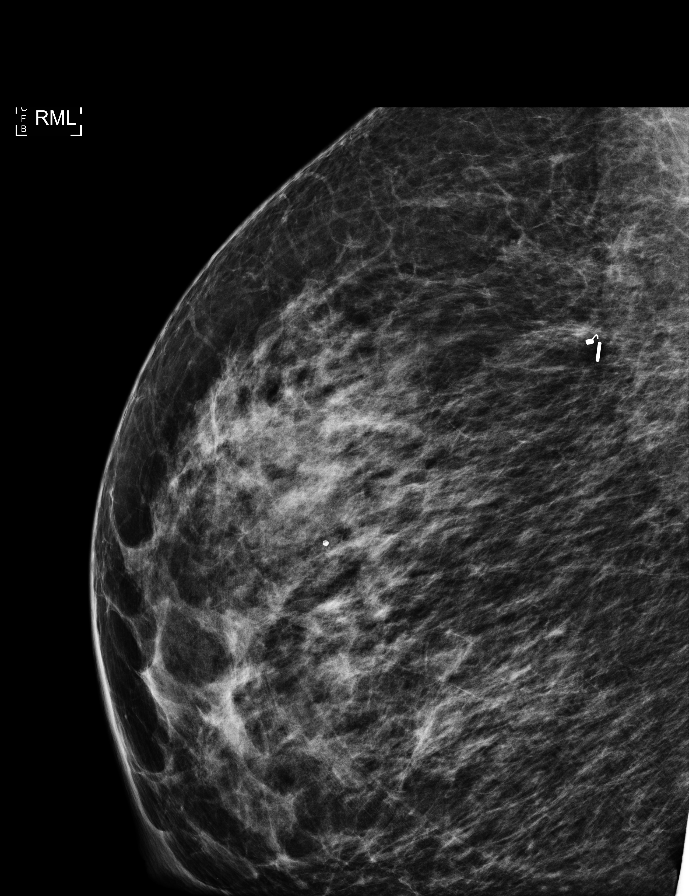

[7 of 7 positions shown; findings below may reference images not displayed]

FINDINGS: Patient presents for radioactive seed localization prior to RIGHT
breast lumpectomy. I met with the patient and we discussed the
procedure of seed localization including benefits and alternatives.
We discussed the high likelihood of a successful procedure. We
discussed the risks of the procedure including infection, bleeding,
tissue injury and further surgery. We discussed the low dose of
radioactivity involved in the procedure. Informed, written consent
was given.

The usual time-out protocol was performed immediately prior to the
procedure.

Using mammographic guidance, sterile technique with chlorhexidine as
skin antisepsis, 1% lidocaine as local anesthetic an [AB]
radioactive seed was used to localize the coil shaped tissue marker
clip using a superior approach.

The follow-up mammogram images confirm that the seed is
appropriately positioned adjacent to the coil clip, located
approximately 4 mm medial to the clip. The images are marked for Dr.
BA.

Follow-up survey of the patient confirms the presence of the
radioactive seed.

Order number of [AB] seed: [PHONE_NUMBER]

Total activity: 0.245 mCi

Reference Date: [DATE]

The patient tolerated the procedure well and was released from the
[REDACTED]. She was given instructions regarding seed removal.
IMPRESSION: Radioactive seed localization of biopsy-proven malignancy involving
the UPPER INNER QUADRANT of the RIGHT breast. No apparent
complications.

## 2021-12-18 MED ORDER — ENSURE PRE-SURGERY PO LIQD: 296.0000 mL | Freq: Once | ORAL | Status: DC

## 2021-12-18 MED ORDER — CHLORHEXIDINE GLUCONATE CLOTH 2 % EX PADS: 6.0000 | MEDICATED_PAD | Freq: Once | CUTANEOUS | Status: DC

## 2021-12-18 MED ORDER — CHLORHEXIDINE GLUCONATE CLOTH 2 % EX PADS
6.0000 | MEDICATED_PAD | Freq: Once | CUTANEOUS | Status: DC
Start: 2021-12-18 — End: 2021-12-21

## 2021-12-18 NOTE — Progress Notes (Signed)
Patient instructed to get chest xray at [facility] imaging. Pt states "Im not getting that." Patient made aware this was necessary for upcoming surgery at [facility]. Chest xray order released and faxed to [facility].

## 2021-12-18 NOTE — Telephone Encounter (Signed)
Scheduled appointment per 2/16 los. Left message. Patient will be mailed an updated calendar. °

## 2021-12-18 NOTE — Progress Notes (Signed)
° ° ° ° °  Enhanced Recovery after Surgery for Orthopedics Enhanced Recovery after Surgery is a protocol used to improve the stress on your body and your recovery after surgery.  Patient Instructions  The night before surgery:  No food after midnight. ONLY clear liquids after midnight  The day of surgery (if you do NOT have diabetes):  Drink ONE (1) Pre-Surgery Clear Ensure as directed.   This drink was given to you during your hospital  pre-op appointment visit. The pre-op nurse will instruct you on the time to drink the  Pre-Surgery Ensure depending on your surgery time. Finish the drink at the designated time by the pre-op nurse.  Nothing else to drink after completing the  Pre-Surgery Clear Ensure.  The day of surgery (if you have diabetes): Drink ONE (1) Gatorade 2 (G2) as directed. This drink was given to you during your hospital  pre-op appointment visit.  The pre-op nurse will instruct you on the time to drink the   Gatorade 2 (G2) depending on your surgery time. Color of the Gatorade may vary. Red is not allowed. Nothing else to drink after completing the  Gatorade 2 (G2).         If you have questions, please contact your surgeons office.   Patient given soap and instructions, verbalized understanding.

## 2021-12-18 NOTE — Progress Notes (Signed)
Text sent reminding pt to come in for EKG and pre surgery drink and soap.

## 2021-12-21 ENCOUNTER — Ambulatory Visit (HOSPITAL_BASED_OUTPATIENT_CLINIC_OR_DEPARTMENT_OTHER)
Admission: RE | Admit: 2021-12-21 | Discharge: 2021-12-21 | Disposition: A | Discharge status: Home / Self Care | Payer: BC Managed Care – PPO | Attending: General Surgery | Admitting: General Surgery

## 2021-12-21 ENCOUNTER — Ambulatory Visit
Admission: RE | Admit: 2021-12-21 | Discharge: 2021-12-21 | Disposition: A | Discharge status: Home / Self Care | Payer: BC Managed Care – PPO | Source: Ambulatory Visit | Attending: General Surgery | Admitting: General Surgery

## 2021-12-21 ENCOUNTER — Encounter (HOSPITAL_BASED_OUTPATIENT_CLINIC_OR_DEPARTMENT_OTHER)
Admission: RE | Disposition: A | Discharge status: Home / Self Care | Payer: Self-pay | Source: Home / Self Care | Attending: General Surgery

## 2021-12-21 ENCOUNTER — Other Ambulatory Visit: Payer: Self-pay

## 2021-12-21 ENCOUNTER — Ambulatory Visit (HOSPITAL_BASED_OUTPATIENT_CLINIC_OR_DEPARTMENT_OTHER): Payer: BC Managed Care – PPO | Admitting: Anesthesiology

## 2021-12-21 ENCOUNTER — Ambulatory Visit (HOSPITAL_COMMUNITY)
Admission: RE | Admit: 2021-12-21 | Discharge: 2021-12-21 | Disposition: A | Discharge status: Home / Self Care | Payer: BC Managed Care – PPO | Source: Ambulatory Visit | Attending: General Surgery | Admitting: General Surgery

## 2021-12-21 ENCOUNTER — Encounter (HOSPITAL_BASED_OUTPATIENT_CLINIC_OR_DEPARTMENT_OTHER): Payer: Self-pay | Admitting: General Surgery

## 2021-12-21 DIAGNOSIS — Z170 Estrogen receptor positive status [ER+]: Secondary | ICD-10-CM

## 2021-12-21 DIAGNOSIS — Z6827 Body mass index (BMI) 27.0-27.9, adult: Secondary | ICD-10-CM | POA: Insufficient documentation

## 2021-12-21 DIAGNOSIS — Z803 Family history of malignant neoplasm of breast: Secondary | ICD-10-CM | POA: Insufficient documentation

## 2021-12-21 DIAGNOSIS — F419 Anxiety disorder, unspecified: Secondary | ICD-10-CM | POA: Insufficient documentation

## 2021-12-21 DIAGNOSIS — E669 Obesity, unspecified: Secondary | ICD-10-CM | POA: Diagnosis not present

## 2021-12-21 DIAGNOSIS — Z8042 Family history of malignant neoplasm of prostate: Secondary | ICD-10-CM | POA: Insufficient documentation

## 2021-12-21 DIAGNOSIS — C50211 Malignant neoplasm of upper-inner quadrant of right female breast: Secondary | ICD-10-CM | POA: Diagnosis present

## 2021-12-21 DIAGNOSIS — E785 Hyperlipidemia, unspecified: Secondary | ICD-10-CM | POA: Diagnosis not present

## 2021-12-21 DIAGNOSIS — Z8701 Personal history of pneumonia (recurrent): Secondary | ICD-10-CM

## 2021-12-21 DIAGNOSIS — I10 Essential (primary) hypertension: Secondary | ICD-10-CM | POA: Insufficient documentation

## 2021-12-21 DIAGNOSIS — Z800 Family history of malignant neoplasm of digestive organs: Secondary | ICD-10-CM | POA: Diagnosis not present

## 2021-12-21 HISTORY — PX: BREAST LUMPECTOMY WITH RADIOACTIVE SEED AND SENTINEL LYMPH NODE BIOPSY: SHX6550

## 2021-12-21 IMAGING — DX MM BREAST SURGICAL SPECIMEN
1 series · 2 of 2 positions shown · non-contrast
Comparison: Previous exam(s).

CLINICAL DATA: Specimen radiograph status post right breast
lumpectomy.

EXAM:
SPECIMEN RADIOGRAPH OF THE RIGHT BREAST

[Series 1: specimen digital x-ray · right · 0.07mm/px · 2 of 2 slices shown]
[im 1/2]
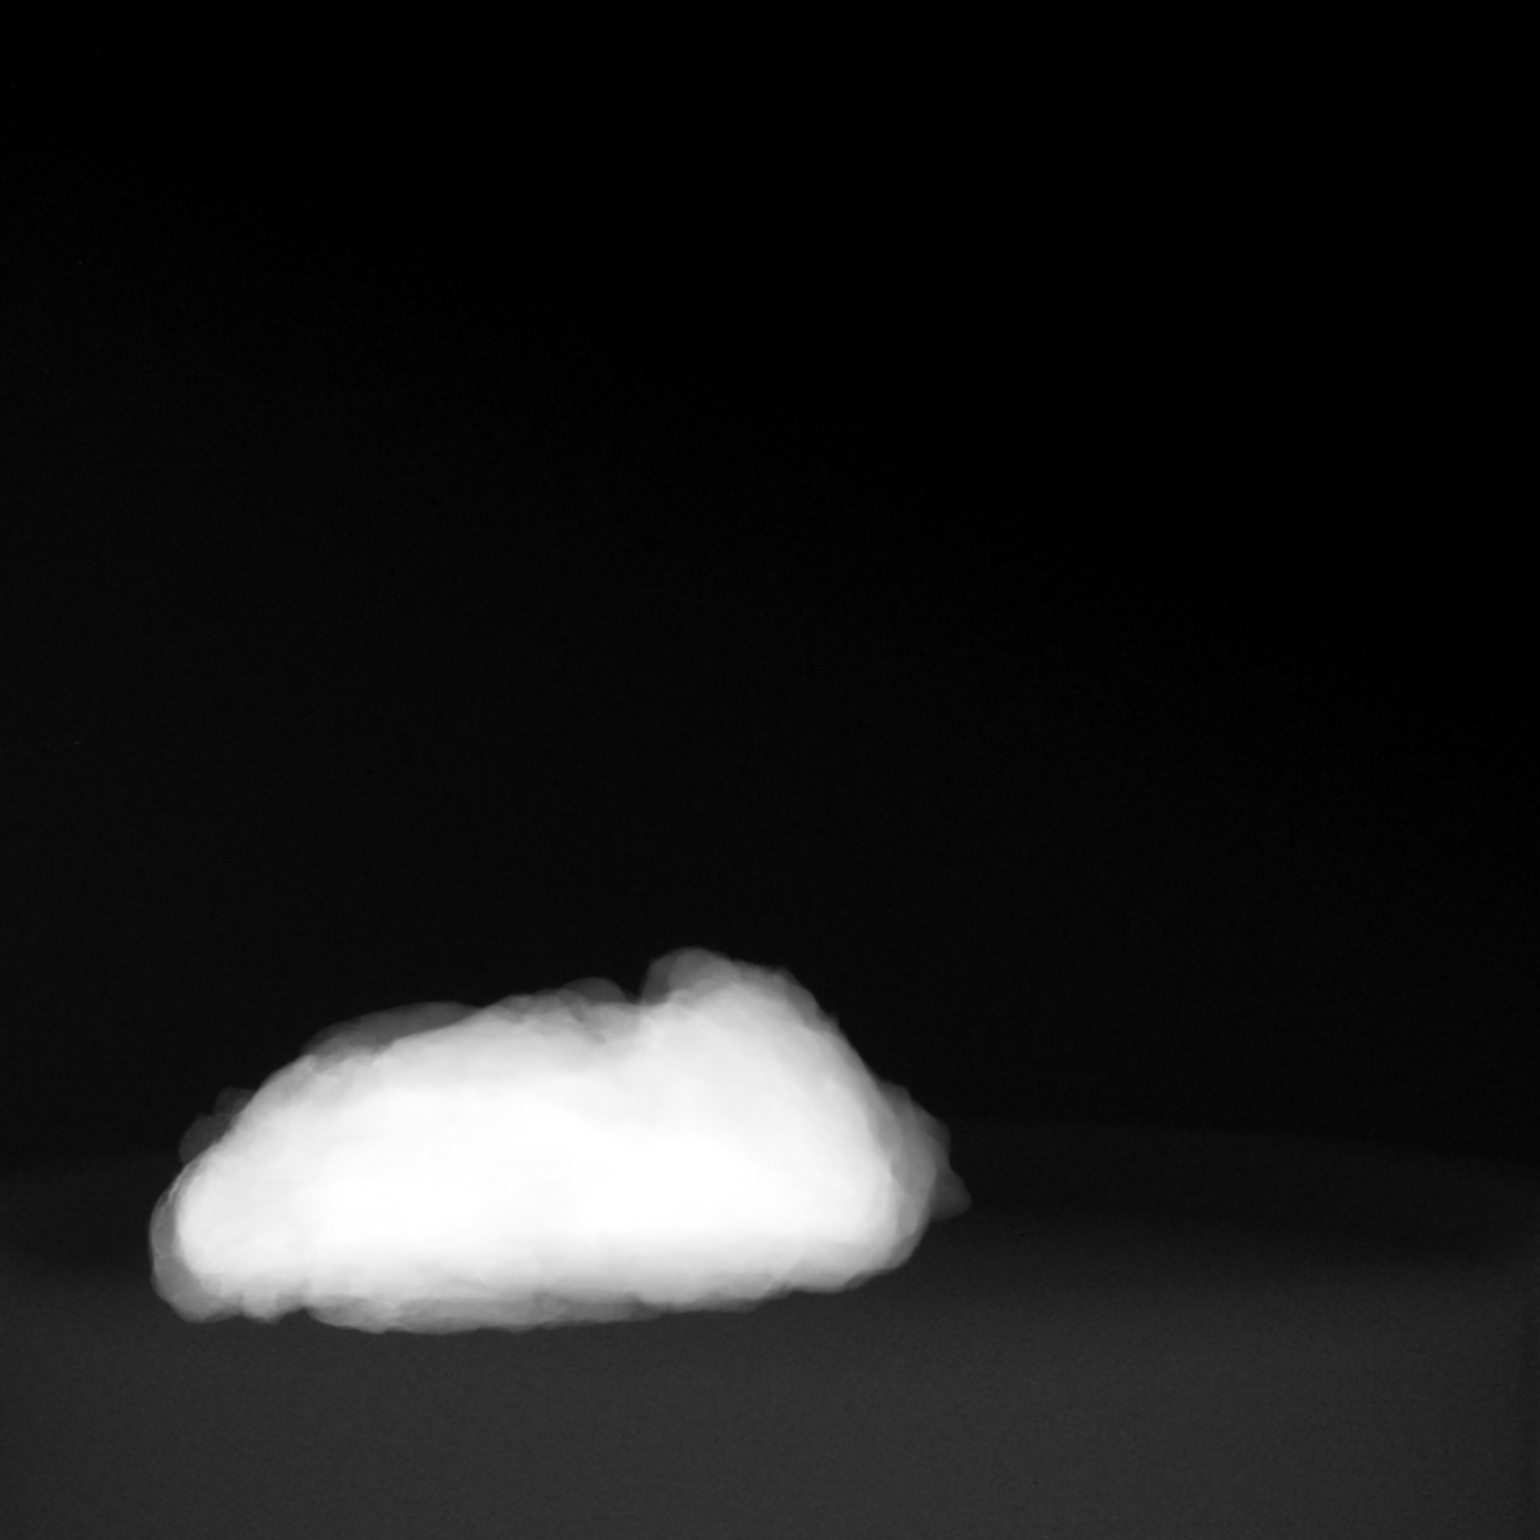
[im 2/2]
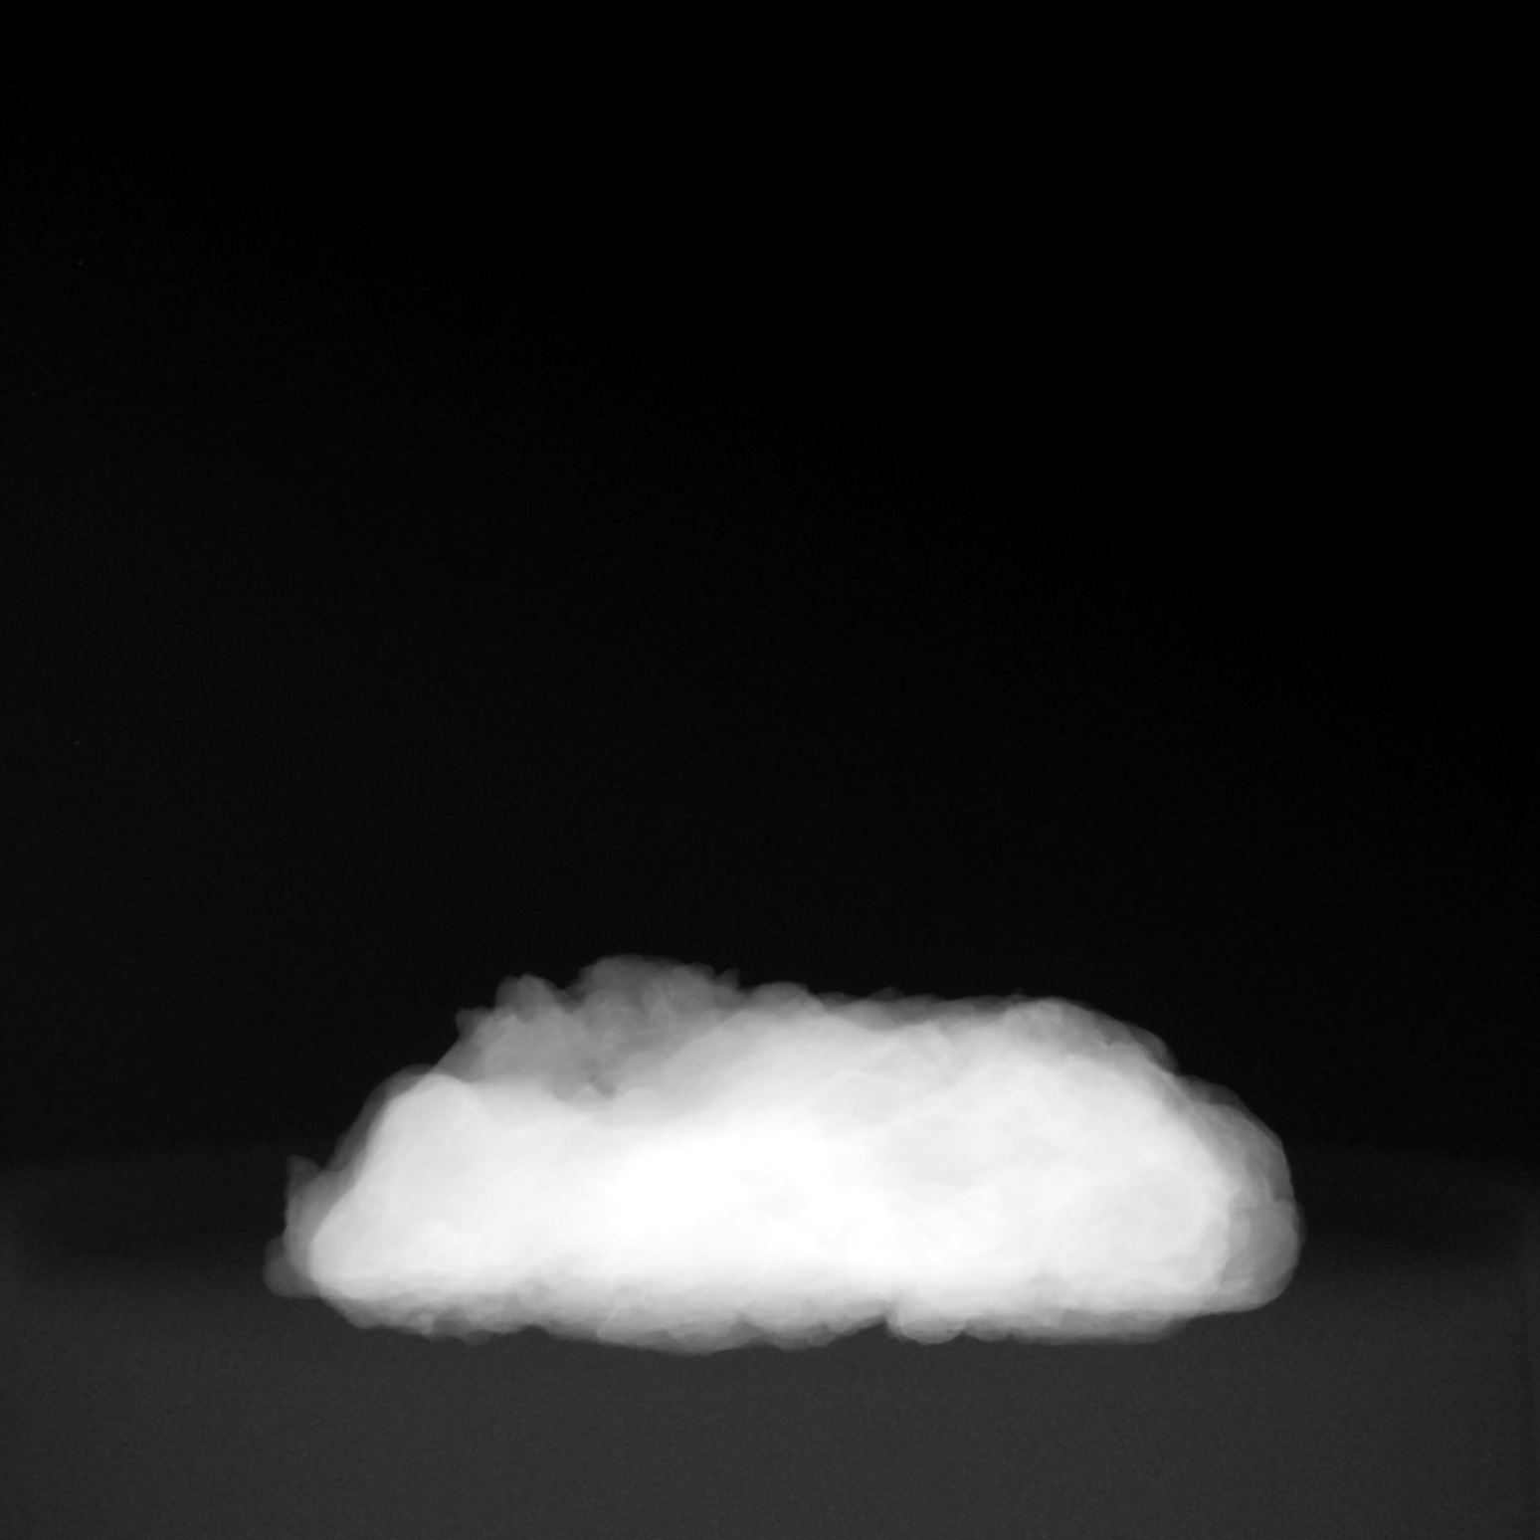

[2 of 2 positions shown; findings below may reference images not displayed]

FINDINGS: Status post excision of the right breast. The radioactive seed and
biopsy marker clip are present and completely intact within the
specimen. These findings were communicated with the OR at [DATE] p.m.
IMPRESSION: Specimen radiograph of the right breast.

## 2021-12-21 SURGERY — BREAST LUMPECTOMY WITH RADIOACTIVE SEED AND SENTINEL LYMPH NODE BIOPSY
Anesthesia: General | Site: Breast | Laterality: Right

## 2021-12-21 MED ORDER — LACTATED RINGERS IV SOLN: INTRAVENOUS | Status: DC

## 2021-12-21 MED ORDER — TECHNETIUM TC 99M TILMANOCEPT KIT
1.0000 | PACK | Freq: Once | INTRAVENOUS | Status: AC | PRN
  Administered 2021-12-21: 1 via INTRADERMAL

## 2021-12-21 MED ORDER — BUPIVACAINE HCL (PF) 0.25 % IJ SOLN
INTRAMUSCULAR | Status: AC
  Filled 2021-12-21: qty 60

## 2021-12-21 MED ORDER — FENTANYL CITRATE (PF) 100 MCG/2ML IJ SOLN
INTRAMUSCULAR | Status: AC
  Filled 2021-12-21: qty 2

## 2021-12-21 MED ORDER — MIDAZOLAM HCL 2 MG/2ML IJ SOLN
2.0000 mg | Freq: Once | INTRAMUSCULAR | Status: AC
Start: 2021-12-21 — End: 2021-12-21
  Administered 2021-12-21: 2 mg via INTRAVENOUS

## 2021-12-21 MED ORDER — PHENYLEPHRINE 40 MCG/ML (10ML) SYRINGE FOR IV PUSH (FOR BLOOD PRESSURE SUPPORT)
PREFILLED_SYRINGE | INTRAVENOUS | Status: AC
  Filled 2021-12-21: qty 40

## 2021-12-21 MED ORDER — CEFAZOLIN SODIUM-DEXTROSE 2-4 GM/100ML-% IV SOLN
2.0000 g | INTRAVENOUS | Status: AC
  Administered 2021-12-21: 2 g via INTRAVENOUS

## 2021-12-21 MED ORDER — HYDROMORPHONE HCL 1 MG/ML IJ SOLN: 0.2500 mg | INTRAMUSCULAR | Status: DC | PRN

## 2021-12-21 MED ORDER — OXYCODONE HCL 5 MG/5ML PO SOLN: 5.0000 mg | Freq: Once | ORAL | Status: AC | PRN

## 2021-12-21 MED ORDER — LIDOCAINE-EPINEPHRINE (PF) 1 %-1:200000 IJ SOLN
INTRAMUSCULAR | Status: DC | PRN
  Administered 2021-12-21: 20 mL via INTRAMUSCULAR

## 2021-12-21 MED ORDER — EPHEDRINE SULFATE-NACL 50-0.9 MG/10ML-% IV SOSY
PREFILLED_SYRINGE | INTRAVENOUS | Status: DC | PRN
Start: 2021-12-21 — End: 2021-12-21
  Administered 2021-12-21: 10 mg via INTRAVENOUS
  Administered 2021-12-21: 5 mg via INTRAVENOUS

## 2021-12-21 MED ORDER — LIDOCAINE 2% (20 MG/ML) 5 ML SYRINGE
INTRAMUSCULAR | Status: DC | PRN
  Administered 2021-12-21: 60 mg via INTRAVENOUS

## 2021-12-21 MED ORDER — MEPERIDINE HCL 25 MG/ML IJ SOLN: 6.2500 mg | INTRAMUSCULAR | Status: DC | PRN

## 2021-12-21 MED ORDER — OXYCODONE HCL 5 MG PO TABS
5.0000 mg | ORAL_TABLET | Freq: Once | ORAL | Status: AC | PRN
  Administered 2021-12-21: 5 mg via ORAL

## 2021-12-21 MED ORDER — FENTANYL CITRATE (PF) 100 MCG/2ML IJ SOLN
100.0000 ug | Freq: Once | INTRAMUSCULAR | Status: AC
  Administered 2021-12-21: 100 ug via INTRAVENOUS

## 2021-12-21 MED ORDER — PROPOFOL 10 MG/ML IV BOLUS
INTRAVENOUS | Status: DC | PRN
  Administered 2021-12-21: 200 mg via INTRAVENOUS
  Administered 2021-12-21: 50 mg via INTRAVENOUS

## 2021-12-21 MED ORDER — PHENYLEPHRINE 40 MCG/ML (10ML) SYRINGE FOR IV PUSH (FOR BLOOD PRESSURE SUPPORT)
PREFILLED_SYRINGE | INTRAVENOUS | Status: DC | PRN
  Administered 2021-12-21 (×9): 80 ug via INTRAVENOUS

## 2021-12-21 MED ORDER — ONDANSETRON HCL 4 MG/2ML IJ SOLN
INTRAMUSCULAR | Status: DC | PRN
  Administered 2021-12-21: 4 mg via INTRAVENOUS

## 2021-12-21 MED ORDER — OXYCODONE HCL 5 MG PO TABS: 5.0000 mg | ORAL_TABLET | Freq: Four times a day (QID) | ORAL | 0 refills | Status: DC | PRN

## 2021-12-21 MED ORDER — OXYCODONE HCL 5 MG PO TABS
ORAL_TABLET | ORAL | Status: AC
  Filled 2021-12-21: qty 1

## 2021-12-21 MED ORDER — LIDOCAINE-EPINEPHRINE (PF) 1 %-1:200000 IJ SOLN
INTRAMUSCULAR | Status: AC
  Filled 2021-12-21: qty 60

## 2021-12-21 MED ORDER — ROPIVACAINE HCL 5 MG/ML IJ SOLN
INTRAMUSCULAR | Status: DC | PRN
  Administered 2021-12-21: 30 mL via PERINEURAL

## 2021-12-21 MED ORDER — PHENYLEPHRINE HCL (PRESSORS) 10 MG/ML IV SOLN
INTRAVENOUS | Status: AC
  Filled 2021-12-21: qty 1

## 2021-12-21 MED ORDER — ACETAMINOPHEN 500 MG PO TABS
ORAL_TABLET | ORAL | Status: AC
  Filled 2021-12-21: qty 2

## 2021-12-21 MED ORDER — CEFAZOLIN SODIUM-DEXTROSE 2-4 GM/100ML-% IV SOLN
INTRAVENOUS | Status: AC
  Filled 2021-12-21: qty 100

## 2021-12-21 MED ORDER — 0.9 % SODIUM CHLORIDE (POUR BTL) OPTIME
TOPICAL | Status: DC | PRN
Start: 2021-12-21 — End: 2021-12-21
  Administered 2021-12-21: 70 mL

## 2021-12-21 MED ORDER — PROMETHAZINE HCL 25 MG/ML IJ SOLN: 6.2500 mg | INTRAMUSCULAR | Status: DC | PRN

## 2021-12-21 MED ORDER — KETOROLAC TROMETHAMINE 30 MG/ML IJ SOLN
INTRAMUSCULAR | Status: DC | PRN
  Administered 2021-12-21: 30 mg via INTRAVENOUS

## 2021-12-21 MED ORDER — MIDAZOLAM HCL 2 MG/2ML IJ SOLN
INTRAMUSCULAR | Status: AC
  Filled 2021-12-21: qty 2

## 2021-12-21 MED ORDER — ACETAMINOPHEN 500 MG PO TABS
1000.0000 mg | ORAL_TABLET | ORAL | Status: AC
  Administered 2021-12-21: 1000 mg via ORAL

## 2021-12-21 MED ORDER — AMISULPRIDE (ANTIEMETIC) 5 MG/2ML IV SOLN: 10.0000 mg | Freq: Once | INTRAVENOUS | Status: DC | PRN

## 2021-12-21 MED ORDER — DEXAMETHASONE SODIUM PHOSPHATE 10 MG/ML IJ SOLN
INTRAMUSCULAR | Status: DC | PRN
  Administered 2021-12-21: 10 mg via INTRAVENOUS

## 2021-12-21 MED ORDER — MAGTRACE LYMPHATIC TRACER
INTRAMUSCULAR | Status: DC | PRN
  Administered 2021-12-21: 2 mL via INTRAMUSCULAR

## 2021-12-21 SURGICAL SUPPLY — 52 items
ADH SKN CLS APL DERMABOND .7 (GAUZE/BANDAGES/DRESSINGS) ×1
APL PRP STRL LF DISP 70% ISPRP (MISCELLANEOUS) ×1
BLADE SURG 10 STRL SS (BLADE) ×2 IMPLANT
BLADE SURG 15 STRL LF DISP TIS (BLADE) ×1 IMPLANT
BLADE SURG 15 STRL SS (BLADE) ×2
BNDG COHESIVE 4X5 TAN ST LF (GAUZE/BANDAGES/DRESSINGS) ×2 IMPLANT
CANISTER SUCT 1200ML W/VALVE (MISCELLANEOUS) ×2 IMPLANT
CHLORAPREP W/TINT 26 (MISCELLANEOUS) ×2 IMPLANT
CLIP TI MEDIUM 6 (CLIP) ×3 IMPLANT
CLIP TI MEDIUM LARGE 6 (CLIP) ×1 IMPLANT
COVER MAYO STAND STRL (DRAPES) ×4 IMPLANT
COVER PROBE W GEL 5X96 (DRAPES) ×3 IMPLANT
DERMABOND ADVANCED (GAUZE/BANDAGES/DRESSINGS) ×1
DERMABOND ADVANCED .7 DNX12 (GAUZE/BANDAGES/DRESSINGS) ×1 IMPLANT
DRAPE UTILITY XL STRL (DRAPES) ×2 IMPLANT
DRSG PAD ABDOMINAL 8X10 ST (GAUZE/BANDAGES/DRESSINGS) ×2 IMPLANT
ELECT COATED BLADE 2.86 ST (ELECTRODE) ×2 IMPLANT
ELECT REM PT RETURN 9FT ADLT (ELECTROSURGICAL) ×2
ELECTRODE REM PT RTRN 9FT ADLT (ELECTROSURGICAL) ×1 IMPLANT
GAUZE SPONGE 4X4 12PLY STRL LF (GAUZE/BANDAGES/DRESSINGS) ×2 IMPLANT
GLOVE SURG ENC MOIS LTX SZ6 (GLOVE) ×2 IMPLANT
GLOVE SURG UNDER POLY LF SZ6.5 (GLOVE) ×2 IMPLANT
GOWN STRL REUS W/ TWL LRG LVL3 (GOWN DISPOSABLE) ×1 IMPLANT
GOWN STRL REUS W/TWL 2XL LVL3 (GOWN DISPOSABLE) ×2 IMPLANT
GOWN STRL REUS W/TWL LRG LVL3 (GOWN DISPOSABLE) ×2
KIT MARKER MARGIN INK (KITS) ×2 IMPLANT
LIGHT WAVEGUIDE WIDE FLAT (MISCELLANEOUS) ×1 IMPLANT
NDL HYPO 25X1 1.5 SAFETY (NEEDLE) ×1 IMPLANT
NDL SAFETY ECLIPSE 18X1.5 (NEEDLE) ×1 IMPLANT
NEEDLE HYPO 18GX1.5 SHARP (NEEDLE) ×2
NEEDLE HYPO 25X1 1.5 SAFETY (NEEDLE) ×2 IMPLANT
NS IRRIG 1000ML POUR BTL (IV SOLUTION) ×2 IMPLANT
PACK BASIN DAY SURGERY FS (CUSTOM PROCEDURE TRAY) ×2 IMPLANT
PACK UNIVERSAL I (CUSTOM PROCEDURE TRAY) ×2 IMPLANT
PENCIL SMOKE EVACUATOR (MISCELLANEOUS) ×2 IMPLANT
SLEEVE SCD COMPRESS KNEE MED (STOCKING) ×2 IMPLANT
SPONGE T-LAP 18X18 ~~LOC~~+RFID (SPONGE) ×4 IMPLANT
STOCKINETTE IMPERVIOUS LG (DRAPES) ×2 IMPLANT
STRIP CLOSURE SKIN 1/2X4 (GAUZE/BANDAGES/DRESSINGS) ×2 IMPLANT
SUT MNCRL AB 4-0 PS2 18 (SUTURE) ×2 IMPLANT
SUT VIC AB 2-0 SH 27 (SUTURE) ×2
SUT VIC AB 2-0 SH 27XBRD (SUTURE) ×1 IMPLANT
SUT VIC AB 3-0 SH 27 (SUTURE) ×2
SUT VIC AB 3-0 SH 27X BRD (SUTURE) ×1 IMPLANT
SUT VICRYL 3-0 CR8 SH (SUTURE) ×2 IMPLANT
SYR BULB EAR ULCER 3OZ GRN STR (SYRINGE) ×2 IMPLANT
SYR CONTROL 10ML LL (SYRINGE) ×2 IMPLANT
TOWEL GREEN STERILE FF (TOWEL DISPOSABLE) ×2 IMPLANT
TRACER MAGTRACE VIAL (MISCELLANEOUS) ×1 IMPLANT
TRAY FAXITRON CT DISP (TRAY / TRAY PROCEDURE) ×2 IMPLANT
TUBE CONNECTING 20X1/4 (TUBING) ×2 IMPLANT
YANKAUER SUCT BULB TIP NO VENT (SUCTIONS) ×2 IMPLANT

## 2021-12-21 NOTE — Anesthesia Postprocedure Evaluation (Signed)
Anesthesia Post Note  Patient: Rita Davis  Procedure(s) Performed: RIGHT BREAST LUMPECTOMY WITH RADIOACTIVE SEED AND SENTINEL LYMPH NODE BIOPSY (Right: Breast)     Patient location during evaluation: PACU Anesthesia Type: General Level of consciousness: awake and alert Pain management: pain level controlled Vital Signs Assessment: post-procedure vital signs reviewed and stable Respiratory status: spontaneous breathing, nonlabored ventilation and respiratory function stable Cardiovascular status: blood pressure returned to baseline and stable Postop Assessment: no apparent nausea or vomiting Anesthetic complications: no   No notable events documented.  Last Vitals:  Vitals:   12/21/21 1500 12/21/21 1515  BP: 124/77 127/79  Pulse: 87 80  Resp: 17 15  Temp:    SpO2: 92% 95%    Last Pain:  Vitals:   12/21/21 1515  TempSrc:   PainSc: 0-No pain                 [name]

## 2021-12-21 NOTE — Transfer of Care (Signed)
Immediate Anesthesia Transfer of Care Note  Patient: Rita Davis  Procedure(s) Performed: RIGHT BREAST LUMPECTOMY WITH RADIOACTIVE SEED AND SENTINEL LYMPH NODE BIOPSY (Right: Breast)  Patient Location: PACU  Anesthesia Type:General and Regional  Level of Consciousness: drowsy  Airway & Oxygen Therapy: Patient Spontanous Breathing and Patient connected to face mask oxygen  Post-op Assessment: Report given to RN and Post -op Vital signs reviewed and stable  Post vital signs: Reviewed and stable  Last Vitals:  Vitals Value Taken Time  BP 112/68 12/21/21 1452  Temp    Pulse 83 12/21/21 1452  Resp 16 12/21/21 1452  SpO2 100 % 12/21/21 1452  Vitals shown include unvalidated device data.  Last Pain:  Vitals:   12/21/21 1024  TempSrc: Oral  PainSc: 0-No pain      Patients Stated Pain Goal: 3 (50/41/36 4383)  Complications: No notable events documented.

## 2021-12-21 NOTE — Anesthesia Procedure Notes (Signed)
Procedure Name: LMA Insertion Date/Time: 12/21/2021 12:58 PM Performed by: Gwyndolyn Saxon, CRNA Pre-anesthesia Checklist: Patient identified, Emergency Drugs available, Suction available and Patient being monitored Patient Re-evaluated:Patient Re-evaluated prior to induction Oxygen Delivery Method: Circle system utilized Preoxygenation: Pre-oxygenation with 100% oxygen Induction Type: IV induction Ventilation: Mask ventilation without difficulty LMA: LMA inserted LMA Size: 4.0 Number of attempts: 1 Placement Confirmation: positive ETCO2 and breath sounds checked- equal and bilateral Tube secured with: Tape Dental Injury: Teeth and Oropharynx as per pre-operative assessment

## 2021-12-21 NOTE — Discharge Instructions (Addendum)
[place] Office Phone Number [phone]  BREAST BIOPSY/ PARTIAL MASTECTOMY: POST OP INSTRUCTIONS  Always review your discharge instruction sheet given to you by the facility where your surgery was performed.  IF YOU HAVE DISABILITY OR FAMILY LEAVE FORMS, YOU MUST BRING THEM TO THE OFFICE FOR PROCESSING.  DO NOT GIVE THEM TO YOUR DOCTOR.  A prescription for pain medication may be given to you upon discharge.  Take your pain medication as prescribed, if needed.  If narcotic pain medicine is not needed, then you may take acetaminophen (Tylenol) or ibuprofen (Advil) as needed. Take your usually prescribed medications unless otherwise directed If you need a refill on your pain medication, please contact your pharmacy.  They will contact our office to request authorization.  Prescriptions will not be filled after 5pm or on week-ends. You should eat very light the first 24 hours after surgery, such as soup, crackers, pudding, etc.  Resume your normal diet the day after surgery. Most patients will experience some swelling and bruising in the breast.  Ice packs and a good support bra will help.  Swelling and bruising can take several days to resolve.  It is common to experience some constipation if taking pain medication after surgery.  Increasing fluid intake and taking a stool softener will usually help or prevent this problem from occurring.  A mild laxative (Milk of Magnesia or Miralax) should be taken according to package directions if there are no bowel movements after 48 hours. Unless discharge instructions indicate otherwise, you may remove your bandages 48 hours after surgery, and you may shower at that time.  You may have steri-strips (small skin tapes) in place directly over the incision.  These strips should be left on the skin for 7-10 days.   Any sutures or staples will be removed at the office during your follow-up visit. ACTIVITIES:  You may resume regular daily activities  (gradually increasing) beginning the next day.  Wearing a good support bra or sports bra (or the breast binder) minimizes pain and swelling.  You may have sexual intercourse when it is comfortable. You may drive when you no longer are taking prescription pain medication, you can comfortably wear a seatbelt, and you can safely maneuver your car and apply brakes. RETURN TO WORK:  __________1 week_______________ Dennis Bast should see your doctor in the office for a follow-up appointment approximately two weeks after your surgery.  Your doctors nurse will typically make your follow-up appointment when she calls you with your pathology report.  Expect your pathology report 2-3 business days after your surgery.  You may call to check if you do not hear from [place] after three days.   WHEN TO CALL YOUR DOCTOR: Fever over 101.0 Nausea and/or vomiting. Extreme swelling or bruising. Continued bleeding from incision. Increased pain, redness, or drainage from the incision.  The clinic staff is available to answer your questions during regular business hours.  Please dont hesitate to call and ask to speak to one of the nurses for clinical concerns.  If you have a medical emergency, go to the nearest emergency room or call 911.  A surgeon from [facility] is always on call at the hospital.  For further questions, please visit [email or web address]      Post Anesthesia Home Care Instructions  Activity: Get plenty of rest for the remainder of the day. A responsible individual must stay with you for 24 hours following the procedure.  For the next 24 hours, DO NOT: -Drive a  car -Paediatric nurse -Drink alcoholic beverages -Take any medication unless instructed by your physician -Make any legal decisions or sign important papers.  Meals: Start with liquid foods such as gelatin or soup. Progress to regular foods as tolerated. Avoid greasy, spicy, heavy foods. If nausea and/or vomiting occur,  drink only clear liquids until the nausea and/or vomiting subsides. Call your physician if vomiting continues.  Special Instructions/Symptoms: Your throat may feel dry or sore from the anesthesia or the breathing tube placed in your throat during surgery. If this causes discomfort, gargle with warm salt water. The discomfort should disappear within 24 hours.  If you had a scopolamine patch placed behind your ear for the management of post- operative nausea and/or vomiting:  1. The medication in the patch is effective for 72 hours, after which it should be removed.  Wrap patch in a tissue and discard in the trash. Wash hands thoroughly with soap and water. 2. You may remove the patch earlier than 72 hours if you experience unpleasant side effects which may include dry mouth, dizziness or visual disturbances. 3. Avoid touching the patch. Wash your hands with soap and water after contact with the patch.       Next dose of Tylenol can be given at 4:30pm if needed. Next dose of NSAID (Ibuprofen/Aleve/Motrin) can be given at 8:45pm if needed.

## 2021-12-21 NOTE — Interval H&P Note (Signed)
History and Physical Interval Note:  12/21/2021 12:05 PM  Rita Davis  has presented today for surgery, with the diagnosis of RIGHT BREAST CANCER.  The various methods of treatment have been discussed with the patient and family. After consideration of risks, benefits and other options for treatment, the patient has consented to  Procedure(s): RIGHT BREAST LUMPECTOMY WITH RADIOACTIVE SEED AND SENTINEL LYMPH NODE BIOPSY (Right) as a surgical intervention.  The patient's history has been reviewed, patient examined, no change in status, stable for surgery.  I have reviewed the patient's chart and labs.  Questions were answered to the patient's satisfaction.     Stark Klein

## 2021-12-21 NOTE — Anesthesia Preprocedure Evaluation (Signed)
Anesthesia Evaluation  Patient identified by MRN, date of birth, ID band Patient awake    Reviewed: Allergy & Precautions, NPO status , Patient's Chart, lab work & pertinent test results  Airway Mallampati: II  TM Distance: >3 FB Neck ROM: Full    Dental no notable dental hx. (+) Teeth Intact, Dental Advisory Given   Pulmonary    Pulmonary exam normal breath sounds clear to auscultation       Cardiovascular hypertension, Pt. on medications Normal cardiovascular exam Rhythm:Regular Rate:Normal     Neuro/Psych PSYCHIATRIC DISORDERS Anxiety    GI/Hepatic negative GI ROS, Neg liver ROS,   Endo/Other  Hyperlipidemia obesity  Renal/GU negative Renal ROS     Musculoskeletal   Abdominal   Peds  Hematology  (+) Blood dyscrasia, anemia ,   Anesthesia Other Findings Breast cancer  Reproductive/Obstetrics                             Anesthesia Physical  Anesthesia Plan  ASA: 3  Anesthesia Plan: General   Post-op Pain Management: Regional block* and Dilaudid IV   Induction: Intravenous  PONV Risk Score and Plan: 3  Airway Management Planned: LMA  Additional Equipment: None  Intra-op Plan:   Post-operative Plan: Extubation in OR  Informed Consent: I have reviewed the patients History and Physical, chart, labs and discussed the procedure including the risks, benefits and alternatives for the proposed anesthesia with the patient or authorized representative who has indicated his/her understanding and acceptance.     Dental advisory given  Plan Discussed with: CRNA, Anesthesiologist and Surgeon  Anesthesia Plan Comments: (See PAT note written 06/17/2021 by Myra Gianotti, PA-C.  )        Anesthesia Quick Evaluation

## 2021-12-21 NOTE — Progress Notes (Signed)
Assisted Dr. Miller with right, ultrasound guided, pectoralis block. Side rails up, monitors on throughout procedure. See vital signs in flow sheet. Tolerated Procedure well. 

## 2021-12-21 NOTE — Anesthesia Procedure Notes (Signed)
Anesthesia Regional Block: Pectoralis block   Pre-Anesthetic Checklist: , timeout performed,  Correct Patient, Correct Site, Correct Laterality,  Correct Procedure, Correct Position, site marked,  Risks and benefits discussed,  Surgical consent,  Pre-op evaluation,  At surgeon's request and post-op pain management  Laterality: Right  Prep: chloraprep       Needles:  Injection technique: Single-shot  Needle Type: Stimiplex     Needle Length: 9cm  Needle Gauge: 21     Additional Needles:   Procedures:,,,, ultrasound used (permanent image in chart),,    Narrative:  Start time: 12/21/2021 11:25 AM End time: 12/21/2021 11:30 AM Injection made incrementally with aspirations every 5 mL.  Performed by: Personally  Anesthesiologist: [name], MD

## 2021-12-21 NOTE — Op Note (Signed)
Right Breast Radioactive seed localized lumpectomy and sentinel lymph node biopsy  Indications: This patient presents with history of Stage IIB right breast cancer, s/p neoadjuvant chemotherapy  Pre-operative Diagnosis: right breast cancer, upper inner quadrant, grade 3 invasive ductal carcinoma, -/-/-, cT2N0 (very weakly ER+)  Post-operative Diagnosis: Same  Surgeon: Stark Klein   Anesthesia: General endotracheal anesthesia  ASA Class: 3  Procedure Details  The patient was seen in the Holding Room. The risks, benefits, complications, treatment options, and expected outcomes were discussed with the patient. The possibilities of bleeding, infection, the need for additional procedures, failure to diagnose a condition, and creating a complication requiring transfusion or operation were discussed with the patient. The patient concurred with the proposed plan, giving informed consent.  The site of surgery properly noted/marked. The patient was taken to Operating Room # 1, identified, and the procedure verified as Right Breast Seed localized Lumpectomy with sentinel lymph node biopsy. The right arm, breast, and chest were prepped and draped in standard fashion.   A Time Out was held and the above information confirmed. MagTrace was injected into the subareolar position.  The lumpectomy was performed by creating a curvilinear incision near the previously placed radioactive seed.  Dissection was carried down to around the point of maximum signal intensity. The cautery was used to perform the dissection.  Hemostasis was achieved with cautery. The edges of the cavity were marked with large clips, with one each medial, lateral, inferior and superior, and two clips posteriorly.   The specimen was inked with the margin marker paint kit.    Specimen radiography confirmed inclusion of the mammographic lesion, the clip, and the seed.  The background signal in the breast was zero.  The wound was irrigated and  closed with 3-0 vicryl in layers and 4-0 monocryl subcuticular suture.    Using a SentiMag probe, deep right axillary sentinel nodes were identified transcutaneously.  An oblique incision was created below the axillary hairline.  Dissection was carried through the clavipectoral fascia.  Four deep level 2 axillary sentinel nodes were removed.  Counts per second were 6900 at the highest.    The background count was 150 cps.  The wound was irrigated.  Hemostasis was achieved with cautery.  The axillary incision was closed with a 3-0 vicryl deep dermal interrupted sutures and a 4-0 monocryl subcuticular closure.    Sterile dressings were applied. At the end of the operation, all sponge, instrument, and needle counts were correct.  Findings: grossly clear surgical margins and no adenopathy, anterior margin is skin, posterior margin is pectoralis. SLN #1 cps 6900, SLN #2 palpable, SLN #3 380, SLN #4 150  Estimated Blood Loss:  min         Specimens: right breast tissue with seed and four right axillary sentinel lymph nodes.             Complications:  None; patient tolerated the procedure well.         Disposition: PACU - hemodynamically stable.         Condition: stable

## 2021-12-21 NOTE — H&P (Signed)
MRN: [id number] DOB: 1960-12-22 DATE OF ENCOUNTER: 12/14/2021 Subjective   Chief Complaint: Breast Cancer (Discuss surgery/)   History of Present Illness: Rita Davis is a 61 y.o. female who is seen today for follow up.  Pt was diagnosed with right breast cancer August 2022. Patient presented with a palpable mass. She located this on her own. She first she thought it might be a benign mass like she had when she was 15, but still thought that she should get it checked out. She underwent diagnostic imaging revealing a 2.9 cm mass in the right breast upper inner quadrant at 1:00. 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 Of note, the patient has a family history of a mother with breast cancer, a brother with colon cancer, and a father with prostate cancer. She feels like the mass is larger since the biopsy last week.   Interval history: Pt had negative genetic testing. Pt has undergone neoadjuvant chemotherapy. She had a near complete response on follow-up MRI. She is ready for surgery. Of note, she was hospitalized for a community-acquired pneumonia in January. This was associated with neutropenia. She took a trip to [place] world and [place] in December 2022.  MR breast 12/10/21 IMPRESSION: 1. There is near complete resolution of the mass in the upper inner quadrant of the right breast. There is a persistent area of non mass enhancement on the delayed postcontrast images spanning at least 4 cm.   2. No evidence of left breast malignancy.   RECOMMENDATION: Treatment plan for known right breast cancer.   BI-RADS CATEGORY 6: Known biopsy-proven malignancy.  Review of Systems: A complete review of systems was obtained from the patient. I have reviewed this information and discussed as appropriate with the patient. See HPI as well for other ROS.  Review of  Systems  All other systems reviewed and are negative.   Medical History: Past Medical History:  Diagnosis Date   Anemia   History of cancer   Patient Active Problem List  Diagnosis   Malignant neoplasm of upper-inner quadrant of right breast in female, estrogen receptor positive (CMS-HCC)   Family history of cancer   Past Surgical History:  Procedure Laterality Date   broken ankle N/A   foot surgery N/A   HYSTERECTOMY    No Known Allergies  Current Outpatient Medications on File Prior to Visit  Medication Sig Dispense Refill   amLODIPine-olmesartan (AZOR) 5-40 mg tablet Take 1 tablet by mouth once daily   escitalopram oxalate (LEXAPRO) 10 MG tablet Take 1 tablet by mouth once daily   No current facility-administered medications on file prior to visit.   Family History  Problem Relation Age of Onset   Breast cancer Mother   Breast cancer Father   Colon cancer Brother    Social History   Tobacco Use  Smoking Status Never  Smokeless Tobacco Never    Social History   Socioeconomic History   Marital status: Married  Tobacco Use   Smoking status: Never   Smokeless tobacco: Never  Vaping Use   Vaping Use: Never used  Substance and Sexual Activity   Alcohol use: Yes   Drug use: Never   Objective:   There were no vitals filed for this visit.  There is no height or weight on file to calculate BMI.  Head: Normocephalic and atraumatic.  Eyes: Conjunctivae are normal.  Pupils are equal, round, and reactive to light. No scleral icterus.  Neck: Normal range of motion. Neck supple. No tracheal deviation present. No thyromegaly present.  Resp: No respiratory distress, normal effort. Breast: breast mass no longer palpable. No LAD. Breasts ptotic bilaterally. No palpable masses. No nipple retraction.  Abd: Abdomen is soft, non distended and non tender. No masses are palpable. There is no rebound and no guarding.  Neurological: Alert and oriented to person, place, and  time. Coordination normal.  Skin: Skin is warm and dry. No rash noted. No diaphoretic. No erythema. No pallor.  Psychiatric: Normal mood and affect. Normal behavior. Judgment and thought content normal.   Labs, Imaging and Diagnostic Testing:  See above  Assessment and Plan:   Diagnoses and all orders for this visit:  Malignant neoplasm of upper-inner quadrant of right breast in female, estrogen receptor positive (CMS-HCC) Assessment & Plan: Patient has had a very good response to neoadjuvant chemotherapy. We will plan on breast conservation treatment. I discussed seed localized lumpectomy with sentinel node mapping and biopsy. Given that she has gotten chemotherapy, I will use two tracers. She has multiple sons who came down help her out postop. She would like to do this soon as she is planning on going to [place] in April.  The surgical procedure was described to the patient. I discussed the incision type and location and that we would need radiology involved on with a wire or seed marker and/or sentinel node.   The risks and benefits of the procedure were described to the patient and she wishes to proceed.   We discussed the risks bleeding, infection, damage to other structures, need for further procedures/surgeries. We discussed the risk of seroma. The patient was advised if the area in the breast in cancer, we may need to go back to surgery for additional tissue to obtain negative margins or for a lymph node biopsy. The patient was advised that these are the most common complications, but that others can occur as well. They were advised against taking aspirin or other anti-inflammatory agents/blood thinners the week before surgery.   Family history of cancer Assessment & Plan: Negative genetic testing.

## 2021-12-22 ENCOUNTER — Encounter (HOSPITAL_BASED_OUTPATIENT_CLINIC_OR_DEPARTMENT_OTHER): Payer: Self-pay | Admitting: General Surgery

## 2021-12-28 ENCOUNTER — Encounter: Payer: Self-pay | Admitting: *Deleted

## 2021-12-30 ENCOUNTER — Other Ambulatory Visit: Payer: BC Managed Care – PPO

## 2021-12-30 ENCOUNTER — Ambulatory Visit: Payer: BC Managed Care – PPO | Admitting: Hematology and Oncology

## 2021-12-30 ENCOUNTER — Ambulatory Visit: Payer: BC Managed Care – PPO

## 2021-12-30 LAB — SURGICAL PATHOLOGY

## 2021-12-30 NOTE — Progress Notes (Signed)
? ?Patient Care Team: ?Jenel Lucks, PA-C as PCP - General (Internal Medicine) ?Rockwell Germany, RN as Oncology Nurse Navigator ?Mauro Kaufmann, RN as Oncology Nurse Navigator ?Stark Klein, MD as Consulting Physician (General Surgery) ?Nicholas Lose, MD as Consulting Physician (Hematology and Oncology) ? ?DIAGNOSIS:  ?  ICD-10-CM   ?1. Malignant neoplasm of upper-inner quadrant of right breast in female, estrogen receptor negative ([place])  C50.211   ? Z17.1   ?  ? ? ?SUMMARY OF ONCOLOGIC HISTORY: ?Oncology History  ?Malignant neoplasm of upper-inner quadrant of right breast in female, estrogen receptor negative ([place])  ?06/03/2021 Initial Diagnosis  ? [place] woman status post right breast upper inner quadrant biopsy 06/03/2021 for a clinical T2N0 invasive ductal carcinoma, grade 3, functionally triple negative, with an MIB-1 of 95%. ?  ?06/09/2021 Cancer Staging  ? Staging form: Breast, AJCC 8th Edition ?- Clinical: Stage IIB (cT2, cN0, cM0, G3, ER-, PR-, HER2-) - Signed by Chauncey Cruel, MD on 06/09/2021 ?Histologic grading system: 3 grade system ? ?  ?06/23/2021 -  Neo-Adjuvant Chemotherapy  ? neoadjuvant chemotherapy consisting of carboplatin paclitaxel and pembrolizumab for 4 cycles starting 06/23/2021, to be followed by doxorubicin and cyclophosphamide and pembrolizumab for 4 cycles, with pembrolizumab to be continued to complete a year ?  ?06/30/2021 Genetic Testing  ? Patient has genetic testing done for personal history of breast cancer. ?No pathogenic variants were detected. Of note, a variant of uncertain significance was detected in the BRIP1 and TSC2 genes.  The genes analyzed include: APC, ATM, AXIN2, BARD1, BMPR1A, BRCA1, BRCA2, BRIP1, CDH1, CDK4*, CDKN2A (p14ARF)*, CDKN2A (p16INK4a)*, CHEK2, CTNNA1, DICER1, EPCAM (Deletion/duplication testing only), GREM1 (promoter region deletion/duplication testing only), KIT, MEN1, MLH1, MSH2, MSH3, MSH6, MUTYH, NBN, NF1, NHTL1, PALB2, PDGFRA*,  PMS2, POLD1, POLE, PTEN, RAD50, RAD51C, RAD51D, SDHB, SDHC, SDHD, SMAD4, SMARCA4. STK11, TP53, TSC1, TSC2, and VHL.  The following genes were evaluated for sequence changes only: SDHA and HOXB13 c.251G>A variant only.  ?  ?11/04/2021 - 11/06/2021 Hospital Admission  ? Neutropenia related pneumonia ?  ? ? ?CHIEF COMPLIANT: Follow-up of triple negative breast cancer ? ?INTERVAL HISTORY: Rita [facility] is a 61 y.o. with above-mentioned history of triple negative breast cancer, currently on chemotherapy with Adriamycin, Cytoxan, Keytruda. Right lumpectomy on 12/21/2021 showed residual invasive poorly differentiated grade 3 DCIS with lymph nodes negative for carcinoma. She presents to the clinic today for follow-up.  ? ?ALLERGIES:  has No Known Allergies. ? ?MEDICATIONS:  ?Current Outpatient Medications  ?Medication Sig Dispense Refill  ? acetaminophen (TYLENOL) 500 MG tablet Take 1,000 mg by mouth every 6 (six) hours as needed for mild pain.    ? amLODipine-olmesartan (AZOR) 5-40 MG tablet Take 1 tablet by mouth in the morning.    ? Cholecalciferol (VITAMIN D3) 250 MCG (10000 UT) TABS Take 10,000 Units by mouth daily.    ? dexamethasone (DECADRON) 4 MG tablet Take 2 tablets once a day for 3 days after carboplatin and AC chemotherapy. Take with food. (Patient taking differently: Take 8 mg by mouth See admin instructions. Take 8 mg once a day for 3 days after carboplatin and AC chemotherapy. Take with food.) 30 tablet 1  ? lidocaine-prilocaine (EMLA) cream Apply to affected area once (Patient taking differently: Apply 1 application topically daily as needed (port site access).) 30 g 3  ? Misc Natural Products (SAMBUCUS ELDERBERRY IMMUNE PO) Take 5 mLs by mouth daily.    ? Misc Natural Products (SUPER GREENS) POWD Take 1 Scoop by mouth  daily.    ? oxyCODONE (OXY IR/ROXICODONE) 5 MG immediate release tablet Take 1 tablet (5 mg total) by mouth every 6 (six) hours as needed for severe pain. 10 tablet 0  ? prochlorperazine  (COMPAZINE) 10 MG tablet Take 1 tablet (10 mg total) by mouth every 6 (six) hours as needed (Nausea or vomiting). 30 tablet 1  ? rosuvastatin (CRESTOR) 20 MG tablet Take 1 tablet (20 mg total) by mouth daily.    ? ?No current facility-administered medications for this visit.  ? ? ?PHYSICAL EXAMINATION: ?ECOG PERFORMANCE STATUS: 1 - Symptomatic but completely ambulatory ? ?Vitals:  ? 12/31/21 1336  ?BP: (!) 146/95  ?Pulse: (!) 115  ?Resp: 18  ?Temp: 97.8 ?F (36.6 ?C)  ?SpO2: 99%  ? ?Filed Weights  ? 12/31/21 1336  ?Weight: 184 lb 11.2 oz (83.8 kg)  ? ? ?BREAST: No palpable masses or nodules in either right or left breasts. No palpable axillary supraclavicular or infraclavicular adenopathy no breast tenderness or nipple discharge. (exam performed in the presence of a chaperone) ? ?LABORATORY DATA:  ?I have reviewed the data as listed ?CMP Latest Ref Rng & Units 12/17/2021 12/09/2021 11/18/2021  ?Glucose 70 - 99 mg/dL 108(H) 107(H) 113(H)  ?BUN 8 - 23 mg/dL '16 16 14  ' ?Creatinine 0.44 - 1.00 mg/dL 0.76 0.81 0.84  ?Sodium 135 - 145 mmol/L 138 142 139  ?Potassium 3.5 - 5.1 mmol/L 3.7 3.7 3.9  ?Chloride 98 - 111 mmol/L 104 109 105  ?CO2 22 - 32 mmol/L '28 28 28  ' ?Calcium 8.9 - 10.3 mg/dL 9.3 9.1 9.4  ?Total Protein 6.5 - 8.1 g/dL 6.4(L) 6.4(L) 6.9  ?Total Bilirubin 0.3 - 1.2 mg/dL 0.2(L) 0.2(L) 0.2(L)  ?Alkaline Phos 38 - 126 U/L 113 71 74  ?AST 15 - 41 U/L '15 20 22  ' ?ALT 0 - 44 U/L '21 21 22  ' ? ? ?Lab Results  ?Component Value Date  ? WBC 13.4 (H) 12/17/2021  ? HGB 9.9 (L) 12/17/2021  ? HCT 29.3 (L) 12/17/2021  ? MCV 86.2 12/17/2021  ? PLT 207 12/17/2021  ? NEUTROABS 10.3 (H) 12/17/2021  ? ? ?ASSESSMENT & PLAN:  ?Malignant neoplasm of upper-inner quadrant of right breast in female, estrogen receptor negative ([place]) ?status post right breast upper inner quadrant biopsy 06/03/2021 for a clinical T2N0 3.3 cm invasive ductal carcinoma, grade 3, functionally triple negative, ER 5%, PR 0%, HER2 2+ by IHC FISH negative, Ki-67 95%  ?   ?Treatment plan:  ?1. Neoadjuvant chemotherapy consisting of carboplatin paclitaxel and pembrolizumab for 4 cycles starting 06/23/2021, to be followed by doxorubicin and cyclophosphamide and pembrolizumab for 4 cycles completed 12/09/2021, with pembrolizumab to be continued to complete a year.   ?2. right Lumpectomy: 0.5 cm grade 2 IDC 0/4 LN Neg, Margins Neg ?3.  Adjuvant radiation therapy  ?------------------------------------------------------------------------------------------------------------------------------ ?Treatment plan:   ?1. Adj XRT  ?2. [name] will continue every 3 weeks..  ? ?RTC every 3 weeks for Keytruda and every 6 weeks for follow up with me ?She plans to go to [place] in April and will start radiation after she returns back from [place]. ? ? ?No orders of the defined types were placed in this encounter. ? ?The patient has a good understanding of the overall plan. she agrees with it. she will call with any problems that may develop before the next visit here. ? ?Total time spent: 20 mins including face to face time and time spent for planning, charting and coordination of care ? ?Bharath Bernstein  Loyal Gambler, MD, MPH ?12/31/2021 ? ?I, Thana Ates, am acting as scribe for Dr. Nicholas Lose. ? ?I have reviewed the above documentation for accuracy and completeness, and I agree with the above. ? ? ? ? ? ? ?

## 2021-12-30 NOTE — Assessment & Plan Note (Signed)
status post right breast upper inner quadrant biopsy 06/03/2021 for a clinical T2N0 invasive ductal carcinoma, grade 3, functionally triple negative,?ER 5%, PR 0%, HER2 2+ by IHC FISH negative, Ki-67 95%? ?? ?Treatment plan:? ?1. Neoadjuvant chemotherapy consisting of carboplatin paclitaxel and pembrolizumab for 4 cycles starting 06/23/2021, to be followed by doxorubicin and cyclophosphamide and pembrolizumab for 4 cycles completed 12/09/2021, with pembrolizumab to be continued to complete a year.?? ?2. right Lumpectomy: 0.5 cm grade 2 IDC 0/4 LN Neg, Margins Neg ?3.  Adjuvant radiation therapy  ?------------------------------------------------------------------------------------------------------------------------------ ?Treatment plan:   ?1. Adj XRT? ?2. [name] will continue every 3 weeks..  ? ?RTC every 3 weeks for Keytruda and every 6 weeks for follow up with me ?

## 2021-12-31 ENCOUNTER — Inpatient Hospital Stay: Payer: BC Managed Care – PPO | Attending: Genetic Counselor | Admitting: Hematology and Oncology

## 2021-12-31 ENCOUNTER — Other Ambulatory Visit: Payer: Self-pay

## 2021-12-31 DIAGNOSIS — C50211 Malignant neoplasm of upper-inner quadrant of right female breast: Secondary | ICD-10-CM | POA: Insufficient documentation

## 2021-12-31 DIAGNOSIS — I10 Essential (primary) hypertension: Secondary | ICD-10-CM | POA: Diagnosis not present

## 2021-12-31 DIAGNOSIS — Z803 Family history of malignant neoplasm of breast: Secondary | ICD-10-CM | POA: Diagnosis not present

## 2021-12-31 DIAGNOSIS — D649 Anemia, unspecified: Secondary | ICD-10-CM | POA: Diagnosis not present

## 2021-12-31 DIAGNOSIS — Z171 Estrogen receptor negative status [ER-]: Secondary | ICD-10-CM | POA: Insufficient documentation

## 2021-12-31 DIAGNOSIS — Z5111 Encounter for antineoplastic chemotherapy: Secondary | ICD-10-CM | POA: Diagnosis present

## 2021-12-31 DIAGNOSIS — E785 Hyperlipidemia, unspecified: Secondary | ICD-10-CM | POA: Insufficient documentation

## 2021-12-31 DIAGNOSIS — Z79899 Other long term (current) drug therapy: Secondary | ICD-10-CM | POA: Diagnosis not present

## 2021-12-31 DIAGNOSIS — Z8042 Family history of malignant neoplasm of prostate: Secondary | ICD-10-CM | POA: Insufficient documentation

## 2021-12-31 DIAGNOSIS — Z800 Family history of malignant neoplasm of digestive organs: Secondary | ICD-10-CM | POA: Insufficient documentation

## 2021-12-31 DIAGNOSIS — Z5112 Encounter for antineoplastic immunotherapy: Secondary | ICD-10-CM | POA: Insufficient documentation

## 2022-01-01 ENCOUNTER — Ambulatory Visit: Payer: BC Managed Care – PPO | Admitting: Hematology and Oncology

## 2022-01-01 ENCOUNTER — Other Ambulatory Visit: Payer: Self-pay | Admitting: Hematology and Oncology

## 2022-01-01 ENCOUNTER — Inpatient Hospital Stay: Payer: BC Managed Care – PPO

## 2022-01-01 ENCOUNTER — Ambulatory Visit: Payer: BC Managed Care – PPO

## 2022-01-01 ENCOUNTER — Inpatient Hospital Stay: Payer: BC Managed Care – PPO | Admitting: *Deleted

## 2022-01-01 ENCOUNTER — Other Ambulatory Visit: Payer: Self-pay | Admitting: *Deleted

## 2022-01-01 ENCOUNTER — Other Ambulatory Visit: Payer: Self-pay

## 2022-01-01 ENCOUNTER — Other Ambulatory Visit: Payer: Self-pay | Admitting: Pharmacist

## 2022-01-01 VITALS — BP 113/78 | HR 87 | Temp 98.0°F | Resp 20

## 2022-01-01 DIAGNOSIS — C50211 Malignant neoplasm of upper-inner quadrant of right female breast: Secondary | ICD-10-CM

## 2022-01-01 DIAGNOSIS — Z95828 Presence of other vascular implants and grafts: Secondary | ICD-10-CM

## 2022-01-01 DIAGNOSIS — Z5112 Encounter for antineoplastic immunotherapy: Secondary | ICD-10-CM | POA: Diagnosis not present

## 2022-01-01 DIAGNOSIS — Z171 Estrogen receptor negative status [ER-]: Secondary | ICD-10-CM

## 2022-01-01 LAB — CMP (CANCER CENTER ONLY)
ALT: 21 U/L (ref 0–44)
AST: 21 U/L (ref 15–41)
Albumin: 4.1 g/dL (ref 3.5–5.0)
Alkaline Phosphatase: 81 U/L (ref 38–126)
Anion gap: 7 (ref 5–15)
BUN: 15 mg/dL (ref 8–23)
CO2: 27 mmol/L (ref 22–32)
Calcium: 9.7 mg/dL (ref 8.9–10.3)
Chloride: 106 mmol/L (ref 98–111)
Creatinine: 0.87 mg/dL (ref 0.44–1.00)
GFR, Estimated: >60 mL/min (ref >60)
Glucose, Bld: 98 mg/dL (ref 70–99)
Potassium: 3.6 mmol/L (ref 3.5–5.1)
Sodium: 140 mmol/L (ref 135–145)
Total Bilirubin: 0.3 mg/dL (ref 0.3–1.2)
Total Protein: 6.8 g/dL (ref 6.5–8.1)

## 2022-01-01 LAB — CBC WITH DIFFERENTIAL (CANCER CENTER ONLY)
Abs Immature Granulocytes: 0.02 10*3/uL (ref 0.00–0.07)
Basophils Absolute: 0.1 10*3/uL (ref 0.0–0.1)
Basophils Relative: 1 %
Eosinophils Absolute: 0.1 10*3/uL (ref 0.0–0.5)
Eosinophils Relative: 2 %
HCT: 31.8 % — ABNORMAL LOW (ref 36.0–46.0)
Hemoglobin: 10.4 g/dL — ABNORMAL LOW (ref 12.0–15.0)
Immature Granulocytes: 0 %
Lymphocytes Relative: 20 %
Lymphs Abs: 1.4 10*3/uL (ref 0.7–4.0)
MCH: 28.8 pg (ref 26.0–34.0)
MCHC: 32.7 g/dL (ref 30.0–36.0)
MCV: 88.1 fL (ref 80.0–100.0)
Monocytes Absolute: 0.8 10*3/uL (ref 0.1–1.0)
Monocytes Relative: 11 %
Neutro Abs: 4.6 10*3/uL (ref 1.7–7.7)
Neutrophils Relative %: 66 %
Platelet Count: 313 10*3/uL (ref 150–400)
RBC: 3.61 MIL/uL — ABNORMAL LOW (ref 3.87–5.11)
RDW: 19.3 % — ABNORMAL HIGH (ref 11.5–15.5)
WBC Count: 6.9 10*3/uL (ref 4.0–10.5)
nRBC: 0 % (ref 0.0–0.2)

## 2022-01-01 LAB — TSH: TSH: 1.235 u[IU]/mL (ref 0.308–3.960)

## 2022-01-01 MED ORDER — SODIUM CHLORIDE 0.9% FLUSH
10.0000 mL | INTRAVENOUS | Status: DC | PRN
  Administered 2022-01-01: 10 mL via INTRAVENOUS

## 2022-01-01 MED ORDER — HEPARIN SOD (PORK) LOCK FLUSH 100 UNIT/ML IV SOLN
500.0000 [IU] | Freq: Once | INTRAVENOUS | Status: AC | PRN
  Administered 2022-01-01: 500 [IU]

## 2022-01-01 MED ORDER — SODIUM CHLORIDE 0.9 % IV SOLN
200.0000 mg | Freq: Once | INTRAVENOUS | Status: AC
  Administered 2022-01-01: 200 mg via INTRAVENOUS
  Filled 2022-01-01: qty 8

## 2022-01-01 MED ORDER — SODIUM CHLORIDE 0.9% FLUSH
10.0000 mL | INTRAVENOUS | Status: DC | PRN
  Administered 2022-01-01: 10 mL

## 2022-01-01 MED ORDER — SODIUM CHLORIDE 0.9 % IV SOLN: Freq: Once | INTRAVENOUS | Status: AC

## 2022-01-01 NOTE — Patient Instructions (Signed)
[facility]  Discharge Instructions: °Thank you for choosing [facility] to provide your oncology and hematology care.  ° °If you have a lab appointment with the Cancer Center, please go directly to the Cancer Center and check in at the registration area. °  °Wear comfortable clothing and clothing appropriate for easy access to any Portacath or PICC line.  ° °We strive to give you quality time with your provider. You may need to reschedule your appointment if you arrive late (15 or more minutes).  Arriving late affects you and other patients whose appointments are after yours.  Also, if you miss three or more appointments without notifying the office, you may be dismissed from the clinic at the provider’s discretion.    °  °For prescription refill requests, have your pharmacy contact our office and allow 72 hours for refills to be completed.   ° °Today you received the following chemotherapy and/or immunotherapy agent: Pembrolizumab (Keytruda) °  °To help prevent nausea and vomiting after your treatment, we encourage you to take your nausea medication as directed. ° °BELOW ARE SYMPTOMS THAT SHOULD BE REPORTED IMMEDIATELY: °*FEVER GREATER THAN 100.4 F (38 °C) OR HIGHER °*CHILLS OR SWEATING °*NAUSEA AND VOMITING THAT IS NOT CONTROLLED WITH YOUR NAUSEA MEDICATION °*UNUSUAL SHORTNESS OF BREATH °*UNUSUAL BRUISING OR BLEEDING °*URINARY PROBLEMS (pain or burning when urinating, or frequent urination) °*BOWEL PROBLEMS (unusual diarrhea, constipation, pain near the anus) °TENDERNESS IN MOUTH AND THROAT WITH OR WITHOUT PRESENCE OF ULCERS (sore throat, sores in mouth, or a toothache) °UNUSUAL RASH, SWELLING OR PAIN  °UNUSUAL VAGINAL DISCHARGE OR ITCHING  ° °Items with * indicate a potential emergency and should be followed up as soon as possible or go to the Emergency Department if any problems should occur. ° °Please show the CHEMOTHERAPY ALERT CARD or IMMUNOTHERAPY ALERT CARD at  check-in to the Emergency Department and triage nurse. ° °Should you have questions after your visit or need to cancel or reschedule your appointment, please contact [facility]  Dept: [phone]  and follow the prompts.  Office hours are 8:00 a.m. to 4:30 p.m. Monday - Friday. Please note that voicemails left after 4:00 p.m. may not be returned until the following business day.  We are closed weekends and major holidays. You have access to a nurse at all times for urgent questions. Please call the main number to the clinic Dept: [phone] and follow the prompts. ° ° °For any non-urgent questions, you may also contact your provider using MyChart. We now offer e-Visits for anyone 18 and older to request care online for non-urgent symptoms. For details visit [email or web address]. °  °Also download the MyChart app! Go to the app store, search "MyChart", open the app, select [facility], and log in with your MyChart username and password. ° °Due to Covid, a mask is required upon entering the hospital/clinic. If you do not have a mask, one will be given to you upon arrival. For doctor visits, patients may have 1 support person aged 18 or older with them. For treatment visits, patients cannot have anyone with them due to current Covid guidelines and our immunocompromised population.  ° °

## 2022-01-01 NOTE — Progress Notes (Signed)
[facility]  ?     Telephone: [phone]?Fax: [phone]  ? ?Oncology Clinical Pharmacist Practitioner Progress Note ? ?R[facility] is a 61 y.o. female with a diagnosis of TNBC breast cancer currently on KN522 treatment plan under the care of Dr. Nicholas Lose.  ? ?Clinical pharmacy was contacted today and asked to assist with entering the adjuvant pembrolizumab treatment plan. Patient has finished her neoadjuvant therapy and received her lumpectomy on 12/21/21. She saw Dr. Lindi Adie yesterday.  She is now starting adjuvant pembrolizumab per the AL937 trial data today.  This plan has been entered and C1D1 has been signed after speaking to Dr. Chryl Heck who is serving as Dr. Geralyn Flash backup today since he is not in the office. ? ?Clinical pharmacy will continue to support Teshara Eckert and Dr. Nicholas Lose as needed. ? ?Raina Mina, RPH-CPP,  ?01/01/2022  8:22 AM  ? ?

## 2022-01-05 ENCOUNTER — Ambulatory Visit: Payer: BC Managed Care – PPO | Attending: Hematology and Oncology | Admitting: Rehabilitation

## 2022-01-05 ENCOUNTER — Encounter: Payer: Self-pay | Admitting: *Deleted

## 2022-01-05 ENCOUNTER — Other Ambulatory Visit: Payer: Self-pay

## 2022-01-05 ENCOUNTER — Encounter: Payer: Self-pay | Admitting: Rehabilitation

## 2022-01-05 DIAGNOSIS — Z483 Aftercare following surgery for neoplasm: Secondary | ICD-10-CM | POA: Insufficient documentation

## 2022-01-05 DIAGNOSIS — R293 Abnormal posture: Secondary | ICD-10-CM | POA: Insufficient documentation

## 2022-01-05 DIAGNOSIS — Z171 Estrogen receptor negative status [ER-]: Secondary | ICD-10-CM | POA: Insufficient documentation

## 2022-01-05 DIAGNOSIS — C50211 Malignant neoplasm of upper-inner quadrant of right female breast: Secondary | ICD-10-CM | POA: Insufficient documentation

## 2022-01-05 NOTE — Therapy (Signed)
?OUTPATIENT PHYSICAL THERAPY BREAST CANCER POST OP FOLLOW UP ? ? ?Patient Name: [facility] ?MRN: [id number] ?DOB:[birth year], 61 y.o., female ?Today's Date: 01/05/2022 ? ? PT End of Session - 01/05/22 0902   ? ? Visit Number 2   ? Number of Visits 2   ? Date for PT Re-Evaluation 01/13/22   ? PT Start Time [phone]   ? PT Stop Time 1191   ? PT Time Calculation (min) 30 min   ? Activity Tolerance Patient tolerated treatment well   ? Behavior During Therapy [facility] for tasks assessed/performed   ? ?  ?  ? ?  ? ? ?Past Medical History:  ?Diagnosis Date  ? Anemia   ? had a fibroid tumor, was anemic at that time  ? Anxiety   ? Cancer [facility])   ? right breast IDC  ? Family history of breast cancer   ? Family history of colon cancer   ? Family history of prostate cancer   ? Hyperlipidemia   ? Hypertension   ? Pneumonia 11/2021  ? hospitalized  ? Pre-diabetes   ? ?Past Surgical History:  ?Procedure Laterality Date  ? ABDOMINAL HYSTERECTOMY    ? still has ovaries  ? BREAST BIOPSY Right 05/23/2012  ? BREAST CYST EXCISION Right   ? BREAST LUMPECTOMY WITH RADIOACTIVE SEED AND SENTINEL LYMPH NODE BIOPSY Right 12/21/2021  ? Procedure: RIGHT BREAST LUMPECTOMY WITH RADIOACTIVE SEED AND SENTINEL LYMPH NODE BIOPSY;  Surgeon: Stark Klein, MD;  Location: [facility];  Service: General;  Laterality: Right;  ? cyst removed    ? from lower back  ? DILATION AND CURETTAGE OF UTERUS    ? ORIF ANKLE FRACTURE Left 11/13/2020  ? Procedure: OPEN TREATMENT OF LEFT TRIMALLEOLAR ANKLE FRACTURE WITH POSTERIOR FIXATION, SYNDESMOSIS;  Surgeon: Erle Crocker, MD;  Location: [facility];  Service: Orthopedics;  Laterality: Left;  LENGTH OF SURGERY: 1.5 HOURS  ? PORTACATH PLACEMENT Left 06/18/2021  ? Procedure: PORT PLACEMENT;  Surgeon: Stark Klein, MD;  Location: [facility];  Service: General;  Laterality: Left;  ? ?Patient Active Problem List  ? Diagnosis Date Noted  ? Bilateral pneumonia 11/05/2021  ? Pneumonia of both lower lobes  due to infectious organism 11/04/2021  ? Genetic testing 07/10/2021  ? Port-A-Cath in place 06/24/2021  ? Family history of breast cancer 06/09/2021  ? Family history of prostate cancer 06/09/2021  ? Family history of colon cancer 06/09/2021  ? Malignant neoplasm of upper-inner quadrant of right breast in female, estrogen receptor negative ([place]) 06/09/2021  ? Pneumonia 03/21/2018  ? Fibroid tumor 03/10/2018  ? Adjustment insomnia 11/26/2015  ? Eczema 11/26/2015  ? Other specified abnormal findings of blood chemistry 11/26/2015  ? Healthcare maintenance 04/10/2014  ? Anxiety state 02/28/2014  ? OVERWEIGHT 03/27/2009  ? Overweight 03/27/2009  ? HYPERLIPIDEMIA 01/24/2009  ? ANEMIA 11/20/2007  ? HYPERTENSION 11/20/2007  ? ? ?PCP: Jenel Lucks, PA-C ? ?REFERRING PROVIDER: Nicholas Lose, MD ? ?REFERRING DIAG: Rt breast cancer ? ?THERAPY DIAG:  ?Malignant neoplasm of upper-inner quadrant of right breast in female, estrogen receptor negative ([place]) ? ?Abnormal posture ? ?Aftercare following surgery for neoplasm ? ?ONSET DATE: 12/21/21 ? ?SUBJECTIVE:                                                                                                                                                                                          ? ?  SUBJECTIVE STATEMENT: ?Surgery was fine.  I did have to get aspiration yesterday in the armpit.  ? ?PERTINENT HISTORY:  ?Initial Diagnosis 06/03/21 of Rt breast cancer. Neo-adjuvant chemotherapy started 06/23/21 of Carboplatin/Paclitaxel/Keytruda followed by Cyclophosphamide and Keytruda. Pt Rt lumpectomy and SLNB 0/4 LN positive on 12/21/21 with Dr. Barry Dienes, and then will have radiation.  ? ?PATIENT GOALS:  Reassess how my recovery is going related to arm function, pain, and swelling. ? ?PAIN:  ?Are you having pain? No ? ?PRECAUTIONS: Recent Surgery, left UE Lymphedema risk ? ? ?OBJECTIVE:  ? ?OBSERVATIONS: ?Gauze and paper tape in axilla from aspiration yesterday, no edema noted, well  healing incision on the breast without scab or glue.  ? ?POSTURE:  ?Reviewed regarding radiation implications ? ?LYMPHEDEMA ASSESSMENT:  ? ?UPPER EXTREMITY AROM/PROM: ?  ?A/PROM RIGHT  12/16/2021 ?  01/05/22  ?Shoulder extension 65 60  ?Shoulder flexion 170 168  ?Shoulder abduction 170 170  ?Shoulder internal rotation 70 70  ?Shoulder external rotation 100 100  ?                        (Blank rows = not tested) ?  ?Circumferences ?Right       01/05/22 ?10cm proximal to olecranon 27  ?Olecranon 25  ?10cm proximal to ulnar styloid 21.5  ?Ulnar styloid 16.8  ?Hand 20.7  ?2nd finger 6.5  ? ? ? ? PATIENT EDUCATION:  ?Education details: per instruction section, when to return to PT ?Person educated: Patient ?Education method: Explanation ?Education comprehension: verbalized understanding ? ? ?HOME EXERCISE PROGRAM: ? Reviewed previously given post op HEP. ?Use of pillow or towel in axilla for seroma ? ?ASSESSMENT: ? ?CLINICAL IMPRESSION: ?Pt returns post Rt lumpectomy and SLNB with return to baseline AROM and no deficits noted to require further in person sessions.  Pt will continue with LDEX screenings and HEP and knows when to return to PT ? ?Pt will benefit from skilled therapeutic intervention to improve on the following deficits: Decreased knowledge of precautions, impaired UE functional use, pain, decreased ROM, postural dysfunction.  ? ?PT treatment/interventions: ADL/Self care home management, Patient/Family education and Manual therapy ? ? ? ? ?GOALS: ?Goals reviewed with patient? Yes ? ?LONG TERM GOALS:  (STG=LTG) ? ?GOALS Name Target Date Goal status  ?1 Pt will demonstrate she has regained full shoulder ROM and function post operatively compared to baselines.  ?Baseline: 01/05/22 MET  ?     ?     ?     ? ? ? ?PLAN: ?PT FREQUENCY/DURATION: return as needed ? ?PLAN FOR NEXT SESSION: return as needed ? ? ?[facility] ? [address], [address] ? [place] [address] ? [phone] ? ?After Breast  Cancer Class ?It is recommended you attend the ABC class to be educated on lymphedema risk reduction. This class is free of charge and lasts for 1 hour. It is a 1-time class. You will need to download the Webex app either on your phone or computer. We will send you a link the night before or the morning of the class. You should be able to click on that link to join the class. This is not a confidential class. You don't have to turn your camera on, but other participants may be able to see your email address. ? ?Scar massage ?You can begin gentle scar massage to you incision sites. Gently place one hand on the incision and move the skin (without sliding on the skin)  in various directions. Do this for a few minutes and then you can gently massage either coconut oil or vitamin E cream into the scars. ? ?Compression garment ?You should continue wearing your compression bra until you feel like you no longer have swelling. ? ?Home exercise Program ?Continue doing the exercises you were given until you feel like you can do them without feeling any tightness at the end.  ? ?Walking Program ?Studies show that 30 minutes of walking per day (fast enough to elevate your heart rate) can significantly reduce the risk of a cancer recurrence. If you can't walk due to other medical reasons, we encourage you to find another activity you could do (like a stationary bike or water exercise). ? ?Posture ?After breast cancer surgery, people frequently sit with rounded shoulders posture because it puts their incisions on slack and feels better. If you sit like this and scar tissue forms in that position, you can become very tight and have pain sitting or standing with good posture. Try to be aware of your posture and sit and stand up tall to heal properly. ? ?Follow up PT: ?It is recommended you return every 3 months for the first 3 years following surgery to be assessed on the SOZO machine for an L-Dex score. This helps prevent clinically  significant lymphedema in 95% of patients. These follow up screens are 10 minute appointments that you are not billed for.  -  04/05/21 at 10am ? ?Stark Bray, PT ?01/05/2022, 10:05 AM ? ?

## 2022-01-05 NOTE — Patient Instructions (Signed)
[facility] ? [address], [address] ? [place] [address] ? [phone] ? ?After Breast Cancer Class ?It is recommended you attend the ABC class to be educated on lymphedema risk reduction. This class is free of charge and lasts for 1 hour. It is a 1-time class. You will need to download the Webex app either on your phone or computer. We will send you a link the night before or the morning of the class. You should be able to click on that link to join the class. This is not a confidential class. You don't have to turn your camera on, but other participants may be able to see your email address. ? ?Scar massage ?You can begin gentle scar massage to you incision sites. Gently place one hand on the incision and move the skin (without sliding on the skin) in various directions. Do this for a few minutes and then you can gently massage either coconut oil or vitamin E cream into the scars. ? ?Compression garment ?You should continue wearing your compression bra until you feel like you no longer have swelling. ? ?Home exercise Program ?Continue doing the exercises you were given until you feel like you can do them without feeling any tightness at the end.  ? ?Walking Program ?Studies show that 30 minutes of walking per day (fast enough to elevate your heart rate) can significantly reduce the risk of a cancer recurrence. If you can't walk due to other medical reasons, we encourage you to find another activity you could do (like a stationary bike or water exercise). ? ?Posture ?After breast cancer surgery, people frequently sit with rounded shoulders posture because it puts their incisions on slack and feels better. If you sit like this and scar tissue forms in that position, you can become very tight and have pain sitting or standing with good posture. Try to be aware of your posture and sit and stand up tall to heal properly. ? ?Follow up PT: ?It is recommended you return every 3 months for the  first 3 years following surgery to be assessed on the SOZO machine for an L-Dex score. This helps prevent clinically significant lymphedema in 95% of patients. These follow up screens are 10 minute appointments that you are not billed for.  -  04/05/21 at 10am ? ?Stark Bray, PT ?01/05/2022, 9:04 AM ? ?

## 2022-01-06 ENCOUNTER — Encounter: Payer: Self-pay | Admitting: Oncology

## 2022-01-06 ENCOUNTER — Encounter: Payer: Self-pay | Admitting: Hematology and Oncology

## 2022-01-07 ENCOUNTER — Encounter: Payer: Self-pay | Admitting: Rehabilitation

## 2022-01-07 ENCOUNTER — Encounter: Payer: Self-pay | Admitting: *Deleted

## 2022-01-14 ENCOUNTER — Encounter (HOSPITAL_COMMUNITY): Payer: Self-pay

## 2022-01-18 NOTE — Progress Notes (Signed)
?Radiation Oncology         [phone] ?________________________________ ? ?Name: Rita Davis        MRN: [id number]  ?Date of Service: 01/20/2022 DOB: 06-14-1961 ? ?CC:O'Connor, Cleda Clarks, PA-C  Nicholas Lose, MD    ? ?REFERRING PHYSICIAN: Nicholas Lose, MD ? ? ?DIAGNOSIS: The encounter diagnosis was Malignant neoplasm of upper-inner quadrant of right breast in female, estrogen receptor negative ([place]). ? ? ?HISTORY OF PRESENT ILLNESS: Rita Davis is a 61 y.o. female with a diagnosis of triple negative right breast cancer.  The patient was found on screening detected mammogram to have a mass in the 1 o'clock position measuring up to 2.9 cm in greatest dimension.  Her axilla was negative for adenopathy.  A biopsy was performed of the mass on 06/03/21 And showed a grade 3 invasive ductal carcinoma that was ER weakly positive at 5%, PR negative, HER2 negative with a Ki-67 of 95%.  Functionally she is considered triple negative.  She received neoadjuvant chemotherapy and immunotherapy between 06/24/2021 and 12/09/2021 .  Post treatment MRI on 12/10/21 showed near complete resolution of her disease and no adenopathy is noted. She underwent right breast lumpectomy with sentinel node biopsy on 12/21/21 showing residual grade 3 invasive ductal carcinoma measuring 5 mm with clear margins and 4 negative sentinel nodes. She's seen today to consider proceeding with adjuvant radiotherapy.  ? ? ? ?PREVIOUS RADIATION THERAPY: No ? ? ?PAST MEDICAL HISTORY:  ?Past Medical History:  ?Diagnosis Date  ? Anemia   ? had a fibroid tumor, was anemic at that time  ? Anxiety   ? Cancer [facility])   ? right breast IDC  ? Family history of breast cancer   ? Family history of colon cancer   ? Family history of prostate cancer   ? Hyperlipidemia   ? Hypertension   ? Pneumonia 11/2021  ? hospitalized  ? Pre-diabetes   ?   ? ? ?PAST SURGICAL HISTORY: ?Past Surgical History:  ?Procedure Laterality Date  ? ABDOMINAL HYSTERECTOMY    ? still has  ovaries  ? BREAST BIOPSY Right 05/23/2012  ? BREAST CYST EXCISION Right   ? BREAST LUMPECTOMY WITH RADIOACTIVE SEED AND SENTINEL LYMPH NODE BIOPSY Right 12/21/2021  ? Procedure: RIGHT BREAST LUMPECTOMY WITH RADIOACTIVE SEED AND SENTINEL LYMPH NODE BIOPSY;  Surgeon: Stark Klein, MD;  Location: [place];  Service: General;  Laterality: Right;  ? cyst removed    ? from lower back  ? DILATION AND CURETTAGE OF UTERUS    ? ORIF ANKLE FRACTURE Left 11/13/2020  ? Procedure: OPEN TREATMENT OF LEFT TRIMALLEOLAR ANKLE FRACTURE WITH POSTERIOR FIXATION, SYNDESMOSIS;  Surgeon: Erle Crocker, MD;  Location: [place];  Service: Orthopedics;  Laterality: Left;  LENGTH OF SURGERY: 1.5 HOURS  ? PORTACATH PLACEMENT Left 06/18/2021  ? Procedure: PORT PLACEMENT;  Surgeon: Stark Klein, MD;  Location: [place];  Service: General;  Laterality: Left;  ? ? ? ?FAMILY HISTORY:  ?Family History  ?Problem Relation Age of Onset  ? Hypertension Mother   ? Breast cancer Mother 57  ?     declined treatment  ? Prostate cancer Father   ?     metastatic, dx 32s  ? Colon cancer Brother 21  ? Cancer Maternal Great-grandmother 16  ?     gynecologic cancer (MGM's mother)  ? ? ? ?SOCIAL HISTORY:  reports that she has never smoked. She has never used smokeless tobacco. She reports current alcohol use.  She reports that she does not use drugs. The patient is married and lives in [place]. She enjoys traveling and has an upcoming trip to [place] she would like to go on and delay radiation until she returns.  ? ?ALLERGIES: Patient has no known allergies. ? ? ?MEDICATIONS:  ?Current Outpatient Medications  ?Medication Sig Dispense Refill  ? acetaminophen (TYLENOL) 500 MG tablet Take 1,000 mg by mouth every 6 (six) hours as needed for mild pain.    ? amLODipine-olmesartan (AZOR) 5-40 MG tablet Take 1 tablet by mouth in the morning.    ? Cholecalciferol (VITAMIN D3) 250 MCG (10000 UT) TABS Take 10,000 Units by mouth daily.     ? Misc Natural Products (SAMBUCUS ELDERBERRY IMMUNE PO) Take 5 mLs by mouth daily.    ? Misc Natural Products (SUPER GREENS) POWD Take 1 Scoop by mouth daily.    ? oxyCODONE (OXY IR/ROXICODONE) 5 MG immediate release tablet Take 1 tablet (5 mg total) by mouth every 6 (six) hours as needed for severe pain. 10 tablet 0  ? rosuvastatin (CRESTOR) 20 MG tablet Take 1 tablet (20 mg total) by mouth daily.    ? ?No current facility-administered medications for this visit.  ? ? ? ?REVIEW OF SYSTEMS: On review of systems, the patient reports that she is doing well without concerns about her breast. She did have aspiration of her axillary incision site, but no concerns about recurrence of this. She feels like her fatigue continues to improve from prior chemo, but is ready to complete her treatment. No other complaints are verbalized.  ? ?  ? ?PHYSICAL EXAM:  ?Wt Readings from Last 3 Encounters:  ?12/31/21 184 lb 11.2 oz (83.8 kg)  ?12/21/21 182 lb 12.2 oz (82.9 kg)  ?12/17/21 184 lb (83.5 kg)  ? ?Temp Readings from Last 3 Encounters:  ?01/01/22 98 ?F (36.7 ?C) (Oral)  ?12/31/21 97.8 ?F (36.6 ?C) (Temporal)  ?12/21/21 97.6 ?F (36.4 ?C)  ? ?BP Readings from Last 3 Encounters:  ?01/01/22 113/78  ?12/31/21 (!) 146/95  ?12/21/21 129/86  ? ?Pulse Readings from Last 3 Encounters:  ?01/01/22 87  ?12/31/21 (!) 115  ?12/21/21 75  ? ? ?In general this is a well appearing African American female in no acute distress. She's alert and oriented x4 and appropriate throughout the examination. Cardiopulmonary assessment is negative for acute distress and she exhibits normal effort. Her right breast reveals a well healed breast incision, and her axillary incision as well. Neither incision has erythema, separation, or drainage. There is induration in her axilla but no fluctuance. ? ? ? ?ECOG = 1 ? ?0 - Asymptomatic (Fully active, able to carry on all predisease activities without restriction) ? ?1 - Symptomatic but completely ambulatory  (Restricted in physically strenuous activity but ambulatory and able to carry out work of a light or sedentary nature. For example, light housework, office work) ? ?2 - Symptomatic, <50% in bed during the day (Ambulatory and capable of all self care but unable to carry out any work activities. Up and about more than 50% of waking hours) ? ?3 - Symptomatic, >50% in bed, but not bedbound (Capable of only limited self-care, confined to bed or chair 50% or more of waking hours) ? ?4 - Bedbound (Completely disabled. Cannot carry on any self-care. Totally confined to bed or chair) ? ?5 - Death ? ? Oken MM, Creech RH, Tormey DC, et al. [phone]). "Toxicity and response criteria of the [facility] Group". [place] Oncol.  5 (6): 649-55 ? ? ? ?LABORATORY DATA:  ?Lab Results  ?Component Value Date  ? WBC 6.9 01/01/2022  ? HGB 10.4 (L) 01/01/2022  ? HCT 31.8 (L) 01/01/2022  ? MCV 88.1 01/01/2022  ? PLT 313 01/01/2022  ? ?Lab Results  ?Component Value Date  ? NA 140 01/01/2022  ? K 3.6 01/01/2022  ? CL 106 01/01/2022  ? CO2 27 01/01/2022  ? ?Lab Results  ?Component Value Date  ? ALT 21 01/01/2022  ? AST 21 01/01/2022  ? ALKPHOS 81 01/01/2022  ? BILITOT 0.3 01/01/2022  ? ?  ? ?RADIOGRAPHY: NM Sentinel Node Inj-No Rpt (Breast) ? ?Result Date: 12/21/2021 ?Sulfur Colloid was injected by the Nuclear Medicine Technologist for sentinel lymph node localization.  ? ?MM Breast Surgical Specimen ? ?Result Date: 12/21/2021 ?CLINICAL DATA:  Specimen radiograph status post right breast lumpectomy. EXAM: SPECIMEN RADIOGRAPH OF THE RIGHT BREAST COMPARISON:  Previous exam(s). FINDINGS: Status post excision of the right breast. The radioactive seed and biopsy marker clip are present and completely intact within the specimen. These findings were communicated with the OR at 1:56 p.m. IMPRESSION: Specimen radiograph of the right breast. Electronically Signed   By: Ammie Ferrier M.D.   On: 12/21/2021 13:58  ?    ? ?IMPRESSION/PLAN: ?1. 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 Dr. Lisbeth Renshaw has reviewed her course to date. I d

## 2022-01-19 ENCOUNTER — Telehealth: Payer: Self-pay

## 2022-01-19 NOTE — Telephone Encounter (Signed)
Telephone appointment reminder. Verified patient identity and reminded patient of her 2:00pm-01/20/22 telephone appointment w/ Shona Simpson PA-C. I left my extension [phone] in case patient needs anything. Patient verbalized understanding. ?

## 2022-01-20 ENCOUNTER — Inpatient Hospital Stay: Payer: BC Managed Care – PPO

## 2022-01-20 ENCOUNTER — Encounter: Payer: Self-pay | Admitting: Radiation Oncology

## 2022-01-20 ENCOUNTER — Ambulatory Visit: Payer: BC Managed Care – PPO | Admitting: Hematology and Oncology

## 2022-01-20 ENCOUNTER — Other Ambulatory Visit: Payer: Self-pay | Admitting: Hematology and Oncology

## 2022-01-20 ENCOUNTER — Ambulatory Visit: Payer: BC Managed Care – PPO

## 2022-01-20 ENCOUNTER — Ambulatory Visit: Payer: BC Managed Care – PPO | Admitting: Radiation Oncology

## 2022-01-20 ENCOUNTER — Other Ambulatory Visit: Payer: BC Managed Care – PPO

## 2022-01-20 ENCOUNTER — Other Ambulatory Visit: Payer: Self-pay

## 2022-01-20 ENCOUNTER — Ambulatory Visit
Admission: RE | Admit: 2022-01-20 | Discharge: 2022-01-20 | Disposition: A | Discharge status: Home / Self Care | Payer: BC Managed Care – PPO | Source: Ambulatory Visit | Attending: Radiation Oncology | Admitting: Radiation Oncology

## 2022-01-20 VITALS — BP 133/83 | HR 94 | Temp 97.8°F | Resp 16 | Wt 187.0 lb

## 2022-01-20 VITALS — BP 133/83 | HR 94 | Temp 97.8°F | Resp 16 | Ht 68.0 in | Wt 189.2 lb

## 2022-01-20 DIAGNOSIS — E785 Hyperlipidemia, unspecified: Secondary | ICD-10-CM | POA: Insufficient documentation

## 2022-01-20 DIAGNOSIS — Z171 Estrogen receptor negative status [ER-]: Secondary | ICD-10-CM | POA: Insufficient documentation

## 2022-01-20 DIAGNOSIS — Z79899 Other long term (current) drug therapy: Secondary | ICD-10-CM | POA: Insufficient documentation

## 2022-01-20 DIAGNOSIS — I10 Essential (primary) hypertension: Secondary | ICD-10-CM | POA: Insufficient documentation

## 2022-01-20 DIAGNOSIS — C50211 Malignant neoplasm of upper-inner quadrant of right female breast: Secondary | ICD-10-CM | POA: Insufficient documentation

## 2022-01-20 DIAGNOSIS — Z803 Family history of malignant neoplasm of breast: Secondary | ICD-10-CM | POA: Insufficient documentation

## 2022-01-20 DIAGNOSIS — Z800 Family history of malignant neoplasm of digestive organs: Secondary | ICD-10-CM | POA: Insufficient documentation

## 2022-01-20 DIAGNOSIS — Z95828 Presence of other vascular implants and grafts: Secondary | ICD-10-CM

## 2022-01-20 DIAGNOSIS — D649 Anemia, unspecified: Secondary | ICD-10-CM | POA: Insufficient documentation

## 2022-01-20 DIAGNOSIS — Z5112 Encounter for antineoplastic immunotherapy: Secondary | ICD-10-CM | POA: Diagnosis not present

## 2022-01-20 DIAGNOSIS — Z8042 Family history of malignant neoplasm of prostate: Secondary | ICD-10-CM | POA: Insufficient documentation

## 2022-01-20 LAB — CBC WITH DIFFERENTIAL (CANCER CENTER ONLY)
Abs Immature Granulocytes: 0.01 10*3/uL (ref 0.00–0.07)
Basophils Absolute: 0 10*3/uL (ref 0.0–0.1)
Basophils Relative: 1 %
Eosinophils Absolute: 0.2 10*3/uL (ref 0.0–0.5)
Eosinophils Relative: 4 %
HCT: 33.3 % — ABNORMAL LOW (ref 36.0–46.0)
Hemoglobin: 10.9 g/dL — ABNORMAL LOW (ref 12.0–15.0)
Immature Granulocytes: 0 %
Lymphocytes Relative: 16 %
Lymphs Abs: 1 10*3/uL (ref 0.7–4.0)
MCH: 28.5 pg (ref 26.0–34.0)
MCHC: 32.7 g/dL (ref 30.0–36.0)
MCV: 87.2 fL (ref 80.0–100.0)
Monocytes Absolute: 0.5 10*3/uL (ref 0.1–1.0)
Monocytes Relative: 7 %
Neutro Abs: 4.5 10*3/uL (ref 1.7–7.7)
Neutrophils Relative %: 72 %
Platelet Count: 235 10*3/uL (ref 150–400)
RBC: 3.82 MIL/uL — ABNORMAL LOW (ref 3.87–5.11)
RDW: 17.6 % — ABNORMAL HIGH (ref 11.5–15.5)
WBC Count: 6.2 10*3/uL (ref 4.0–10.5)
nRBC: 0 % (ref 0.0–0.2)

## 2022-01-20 LAB — CMP (CANCER CENTER ONLY)
ALT: 22 U/L (ref 0–44)
AST: 25 U/L (ref 15–41)
Albumin: 4.2 g/dL (ref 3.5–5.0)
Alkaline Phosphatase: 99 U/L (ref 38–126)
Anion gap: 7 (ref 5–15)
BUN: 16 mg/dL (ref 8–23)
CO2: 28 mmol/L (ref 22–32)
Calcium: 9.9 mg/dL (ref 8.9–10.3)
Chloride: 107 mmol/L (ref 98–111)
Creatinine: 0.96 mg/dL (ref 0.44–1.00)
GFR, Estimated: >60 mL/min (ref >60)
Glucose, Bld: 112 mg/dL — ABNORMAL HIGH (ref 70–99)
Potassium: 3.6 mmol/L (ref 3.5–5.1)
Sodium: 142 mmol/L (ref 135–145)
Total Bilirubin: 0.4 mg/dL (ref 0.3–1.2)
Total Protein: 7.1 g/dL (ref 6.5–8.1)

## 2022-01-20 LAB — TSH: TSH: 1.643 u[IU]/mL (ref 0.308–3.960)

## 2022-01-20 MED ORDER — SODIUM CHLORIDE 0.9 % IV SOLN: Freq: Once | INTRAVENOUS | Status: AC

## 2022-01-20 MED ORDER — SODIUM CHLORIDE 0.9% FLUSH
10.0000 mL | INTRAVENOUS | Status: DC | PRN
  Administered 2022-01-20: 10 mL via INTRAVENOUS

## 2022-01-20 MED ORDER — SODIUM CHLORIDE 0.9% FLUSH
10.0000 mL | INTRAVENOUS | Status: DC | PRN
  Administered 2022-01-20: 10 mL

## 2022-01-20 MED ORDER — SODIUM CHLORIDE 0.9 % IV SOLN
200.0000 mg | Freq: Once | INTRAVENOUS | Status: AC
  Administered 2022-01-20: 200 mg via INTRAVENOUS
  Filled 2022-01-20: qty 200

## 2022-01-20 MED ORDER — HEPARIN SOD (PORK) LOCK FLUSH 100 UNIT/ML IV SOLN
500.0000 [IU] | Freq: Once | INTRAVENOUS | Status: AC | PRN
  Administered 2022-01-20: 500 [IU]

## 2022-01-20 NOTE — Progress Notes (Signed)
Patient here for a "FUN" appointment w/ Shona Simpson PA-C. I verified patient identity and began nursing interview. Patient reports RT breast soreness 2/10. No other issues reported to me at this time. ?  ?Meaningful use complete, ?Hysterectomy- NO chances of pregnancy. ? ?BP 133/83 (BP Location: Left Arm, Patient Position: Sitting, Cuff Size: Large)   Pulse 94   Temp 97.8 ?F (36.6 ?C) (Temporal)   Resp 16   Ht '5\' 8"'$  (1.727 m)   Wt 189 lb 3.2 oz (85.8 kg)   SpO2 99%   BMI 28.77 kg/m?  ? ?

## 2022-01-20 NOTE — Patient Instructions (Signed)
[facility]  Discharge Instructions: °Thank you for choosing [facility] to provide your oncology and hematology care.  ° °If you have a lab appointment with the Cancer Center, please go directly to the Cancer Center and check in at the registration area. °  °Wear comfortable clothing and clothing appropriate for easy access to any Portacath or PICC line.  ° °We strive to give you quality time with your provider. You may need to reschedule your appointment if you arrive late (15 or more minutes).  Arriving late affects you and other patients whose appointments are after yours.  Also, if you miss three or more appointments without notifying the office, you may be dismissed from the clinic at the provider’s discretion.    °  °For prescription refill requests, have your pharmacy contact our office and allow 72 hours for refills to be completed.   ° °Today you received the following chemotherapy and/or immunotherapy agent: Pembrolizumab (Keytruda) °  °To help prevent nausea and vomiting after your treatment, we encourage you to take your nausea medication as directed. ° °BELOW ARE SYMPTOMS THAT SHOULD BE REPORTED IMMEDIATELY: °*FEVER GREATER THAN 100.4 F (38 °C) OR HIGHER °*CHILLS OR SWEATING °*NAUSEA AND VOMITING THAT IS NOT CONTROLLED WITH YOUR NAUSEA MEDICATION °*UNUSUAL SHORTNESS OF BREATH °*UNUSUAL BRUISING OR BLEEDING °*URINARY PROBLEMS (pain or burning when urinating, or frequent urination) °*BOWEL PROBLEMS (unusual diarrhea, constipation, pain near the anus) °TENDERNESS IN MOUTH AND THROAT WITH OR WITHOUT PRESENCE OF ULCERS (sore throat, sores in mouth, or a toothache) °UNUSUAL RASH, SWELLING OR PAIN  °UNUSUAL VAGINAL DISCHARGE OR ITCHING  ° °Items with * indicate a potential emergency and should be followed up as soon as possible or go to the Emergency Department if any problems should occur. ° °Please show the CHEMOTHERAPY ALERT CARD or IMMUNOTHERAPY ALERT CARD at  check-in to the Emergency Department and triage nurse. ° °Should you have questions after your visit or need to cancel or reschedule your appointment, please contact [facility]  Dept: [phone]  and follow the prompts.  Office hours are 8:00 a.m. to 4:30 p.m. Monday - Friday. Please note that voicemails left after 4:00 p.m. may not be returned until the following business day.  We are closed weekends and major holidays. You have access to a nurse at all times for urgent questions. Please call the main number to the clinic Dept: [phone] and follow the prompts. ° ° °For any non-urgent questions, you may also contact your provider using MyChart. We now offer e-Visits for anyone 18 and older to request care online for non-urgent symptoms. For details visit [email or web address]. °  °Also download the MyChart app! Go to the app store, search "MyChart", open the app, select [facility], and log in with your MyChart username and password. ° °Due to Covid, a mask is required upon entering the hospital/clinic. If you do not have a mask, one will be given to you upon arrival. For doctor visits, patients may have 1 support person aged 18 or older with them. For treatment visits, patients cannot have anyone with them due to current Covid guidelines and our immunocompromised population.  ° °

## 2022-01-22 ENCOUNTER — Ambulatory Visit: Payer: BC Managed Care – PPO

## 2022-01-27 ENCOUNTER — Encounter: Payer: Self-pay | Admitting: *Deleted

## 2022-01-29 ENCOUNTER — Telehealth: Payer: Self-pay | Admitting: Hematology and Oncology

## 2022-01-29 NOTE — Telephone Encounter (Signed)
Called patient to schedule appointment per 2/16 inbasket, patient is aware of date and time.   °

## 2022-02-06 NOTE — Progress Notes (Signed)
[facility] ?OFFICE PROGRESS NOTE ? ?Jenel Lucks, PA-C ?[address] ?[place] [address] ? ?DIAGNOSIS: Triple Negative Breast Cancer ? ?Oncology History  ?Malignant neoplasm of upper-inner quadrant of right breast in female, estrogen receptor negative ([place])  ?06/03/2021 Initial Diagnosis  ? [place] woman status post right breast upper inner quadrant biopsy 06/03/2021 for a clinical T2N0 invasive ductal carcinoma, grade 3, functionally triple negative, with an MIB-1 of 95%. ?  ?06/09/2021 Cancer Staging  ? Staging form: Breast, AJCC 8th Edition ?- Clinical: Stage IIB (cT2, cN0, cM0, G3, ER-, PR-, HER2-) - Signed by Chauncey Cruel, MD on 06/09/2021 ?Histologic grading system: 3 grade system ? ?  ?06/23/2021 -  Neo-Adjuvant Chemotherapy  ? neoadjuvant chemotherapy consisting of carboplatin paclitaxel and pembrolizumab for 4 cycles starting 06/23/2021, to be followed by doxorubicin and cyclophosphamide and pembrolizumab for 4 cycles, with pembrolizumab to be continued to complete a year ?  ?06/30/2021 Genetic Testing  ? Patient has genetic testing done for personal history of breast cancer. ?No pathogenic variants were detected. Of note, a variant of uncertain significance was detected in the BRIP1 and TSC2 genes.  The genes analyzed include: APC, ATM, AXIN2, BARD1, BMPR1A, BRCA1, BRCA2, BRIP1, CDH1, CDK4*, CDKN2A (p14ARF)*, CDKN2A (p16INK4a)*, CHEK2, CTNNA1, DICER1, EPCAM (Deletion/duplication testing only), GREM1 (promoter region deletion/duplication testing only), KIT, MEN1, MLH1, MSH2, MSH3, MSH6, MUTYH, NBN, NF1, NHTL1, PALB2, PDGFRA*, PMS2, POLD1, POLE, PTEN, RAD50, RAD51C, RAD51D, SDHB, SDHC, SDHD, SMAD4, SMARCA4. STK11, TP53, TSC1, TSC2, and VHL.  The following genes were evaluated for sequence changes only: SDHA and HOXB13 c.251G>A variant only.  ?  ?11/04/2021 - 11/06/2021 Hospital Admission  ? Neutropenia related pneumonia ?  ?01/01/2022 -  Chemotherapy  ? Patient is on Treatment  Plan : BREAST Pembrolizumab (200) q21d x 27 weeks  ?   ? ? ? ?CURRENT THERAPY: Keytruda IV every 3 weeks. Status post 2 cycles.  ? ?INTERVAL HISTORY: ?Rafeef Younts 61 y.o. female returns to the clinic today for a follow up visit. The patient was last seen in the clinic on 12/31/21 by Dr. Lindi Adie. She also saw radiation oncology in the interval to discuss adjuvant radiation. The patient is going on a trip to [place] on 02/17/22 and she will delay the start of adjuvant radiation until May. She has her CT simulation on 02/26/22. She is currently on immunotherapy with Keytruda. She is status post 2 cycles and tolerates it well without any concerning adverse side effects. She denies fevers, chills, night sweats, or weight loss. Denies chest pain, cough, or shortness of breath. Denies skin changes/rashes. Denies nausea, vomiting, diarrhea or constipation. She denies new bone pain. Denies new headaches or visual change. 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 Her surgeon started her on gabapentin 100 mg BID. She also tried to go to a holistic practice who recommended B12 and B6 supplements without improvement. She also underwent some treatment with boots/massage on her feet without improvement. She is wondering what else he can take. She is here for evaluation and repeat blood work before starting cycle #3.  ? ? ?MEDICAL HISTORY: ?Past Medical History:  ?Diagnosis Date  ? Anemia   ? had a fibroid tumor, was anemic at that time  ? Anxiety   ? Cancer [facility])   ? right breast IDC  ? Family history of breast cancer   ? Family history of colon cancer   ? Family history of  prostate cancer   ? Hyperlipidemia   ? Hypertension   ? Pneumonia 11/2021  ? hospitalized  ? Pre-diabetes   ? ? ?ALLERGIES:  has No Known Allergies. ? ?MEDICATIONS:  ?Current Outpatient Medications  ?Medication Sig Dispense Refill  ? acetaminophen (TYLENOL) 500 MG tablet Take 1,000 mg by  mouth every 6 (six) hours as needed for mild pain.    ? amLODipine-olmesartan (AZOR) 5-40 MG tablet Take 1 tablet by mouth in the morning.    ? Cholecalciferol (VITAMIN D3) 250 MCG (10000 UT) TABS Take 10,000 Units by mouth daily.    ? gabapentin (NEURONTIN) 100 MG capsule Take 100 mg by mouth 2 (two) times daily.    ? Misc Natural Products (SAMBUCUS ELDERBERRY IMMUNE PO) Take 5 mLs by mouth daily.    ? Misc Natural Products (SUPER GREENS) POWD Take 1 Scoop by mouth daily.    ? oxyCODONE (OXY IR/ROXICODONE) 5 MG immediate release tablet Take 1 tablet (5 mg total) by mouth every 6 (six) hours as needed for severe pain. 10 tablet 0  ? rosuvastatin (CRESTOR) 20 MG tablet Take 1 tablet (20 mg total) by mouth daily.    ? ?No current facility-administered medications for this visit.  ? ? ?SURGICAL HISTORY:  ?Past Surgical History:  ?Procedure Laterality Date  ? ABDOMINAL HYSTERECTOMY    ? still has ovaries  ? BREAST BIOPSY Right 05/23/2012  ? BREAST CYST EXCISION Right   ? BREAST LUMPECTOMY WITH RADIOACTIVE SEED AND SENTINEL LYMPH NODE BIOPSY Right 12/21/2021  ? Procedure: RIGHT BREAST LUMPECTOMY WITH RADIOACTIVE SEED AND SENTINEL LYMPH NODE BIOPSY;  Surgeon: Stark Klein, MD;  Location: [facility];  Service: General;  Laterality: Right;  ? cyst removed    ? from lower back  ? DILATION AND CURETTAGE OF UTERUS    ? ORIF ANKLE FRACTURE Left 11/13/2020  ? Procedure: OPEN TREATMENT OF LEFT TRIMALLEOLAR ANKLE FRACTURE WITH POSTERIOR FIXATION, SYNDESMOSIS;  Surgeon: Erle Crocker, MD;  Location: [facility];  Service: Orthopedics;  Laterality: Left;  LENGTH OF SURGERY: 1.5 HOURS  ? PORTACATH PLACEMENT Left 06/18/2021  ? Procedure: PORT PLACEMENT;  Surgeon: Stark Klein, MD;  Location: [facility];  Service: General;  Laterality: Left;  ? ? ?REVIEW OF SYSTEMS:   ?Review of Systems  ?Constitutional: Negative for appetite change, chills, fatigue, fever and unexpected weight change.  ?HENT: Negative for  mouth sores, nosebleeds, sore throat and trouble swallowing.   ?Eyes: Negative for eye problems and icterus.  ?Respiratory: Negative for cough, hemoptysis, shortness of breath and wheezing.   ?Cardiovascular: Negative for chest pain and leg swelling.  ?Gastrointestinal: Negative for abdominal pain, constipation, diarrhea, nausea and vomiting.  ?Genitourinary: Negative for bladder incontinence, difficulty urinating, dysuria, frequency and hematuria.   ?Musculoskeletal: Negative for back pain, gait problem, neck pain and neck stiffness.  ?Skin: Negative for itching and rash.  ?Neurological: Positive for peripheral neuropathy. Negative for dizziness, extremity weakness, gait problem, headaches, light-headedness and seizures.  ?Hematological: Negative for adenopathy. Does not bruise/bleed easily.  ?Psychiatric/Behavioral: Negative for confusion, depression and sleep disturbance. The patient is not nervous/anxious.   ? ? ?PHYSICAL EXAMINATION:  ?Blood pressure (!) 143/92, pulse 97, temperature (!) 97.5 ?F (36.4 ?C), temperature source Tympanic, resp. rate 20, height _0  (1.727 m), weight 185 lb 3.2 oz (84 kg), SpO2 98 %. ? ?ECOG PERFORMANCE STATUS: 1 ? ?Physical Exam  ?Constitutional: Oriented to person, place, and time and well-developed, well-nourished, and in no distress.  ?HENT:  ?Head:  Normocephalic and atraumatic.  ?Mouth/Throat: Oropharynx is clear and moist. No oropharyngeal exudate.  ?Eyes: Conjunctivae are normal. Right eye exhibits no discharge. Left eye exhibits no discharge. No scleral icterus.  ?Neck: Normal range of motion. Neck supple.  ?Cardiovascular: Normal rate, regular rhythm, normal heart sounds and intact distal pulses.   ?Pulmonary/Chest: Effort normal and breath sounds normal. No respiratory distress. No wheezes. No rales.  ?Abdominal: Soft. Bowel sounds are normal. Exhibits no distension and no mass. There is no tenderness.  ?Musculoskeletal: Normal range of motion. Exhibits no edema.   ?Lymphadenopathy:  ?  No cervical adenopathy.  ?Neurological: Alert and oriented to person, place, and time. Exhibits normal muscle tone. Gait normal. Coordination normal.  ?Skin: Skin is warm and dry. No rash

## 2022-02-10 ENCOUNTER — Other Ambulatory Visit: Payer: Self-pay

## 2022-02-10 ENCOUNTER — Ambulatory Visit: Payer: BC Managed Care – PPO

## 2022-02-10 ENCOUNTER — Inpatient Hospital Stay: Payer: BC Managed Care – PPO

## 2022-02-10 ENCOUNTER — Inpatient Hospital Stay: Payer: BC Managed Care – PPO | Attending: Genetic Counselor

## 2022-02-10 ENCOUNTER — Inpatient Hospital Stay (HOSPITAL_BASED_OUTPATIENT_CLINIC_OR_DEPARTMENT_OTHER): Payer: BC Managed Care – PPO | Admitting: Physician Assistant

## 2022-02-10 VITALS — BP 143/92 | HR 97 | Temp 97.5°F | Resp 20 | Ht 68.0 in | Wt 185.2 lb

## 2022-02-10 DIAGNOSIS — Z800 Family history of malignant neoplasm of digestive organs: Secondary | ICD-10-CM | POA: Insufficient documentation

## 2022-02-10 DIAGNOSIS — Z79899 Other long term (current) drug therapy: Secondary | ICD-10-CM | POA: Diagnosis not present

## 2022-02-10 DIAGNOSIS — Z171 Estrogen receptor negative status [ER-]: Secondary | ICD-10-CM

## 2022-02-10 DIAGNOSIS — Z95828 Presence of other vascular implants and grafts: Secondary | ICD-10-CM

## 2022-02-10 DIAGNOSIS — G629 Polyneuropathy, unspecified: Secondary | ICD-10-CM | POA: Insufficient documentation

## 2022-02-10 DIAGNOSIS — Z5112 Encounter for antineoplastic immunotherapy: Secondary | ICD-10-CM | POA: Diagnosis present

## 2022-02-10 DIAGNOSIS — D709 Neutropenia, unspecified: Secondary | ICD-10-CM | POA: Diagnosis not present

## 2022-02-10 DIAGNOSIS — Z8042 Family history of malignant neoplasm of prostate: Secondary | ICD-10-CM | POA: Insufficient documentation

## 2022-02-10 DIAGNOSIS — Z803 Family history of malignant neoplasm of breast: Secondary | ICD-10-CM | POA: Insufficient documentation

## 2022-02-10 DIAGNOSIS — C50211 Malignant neoplasm of upper-inner quadrant of right female breast: Secondary | ICD-10-CM | POA: Diagnosis present

## 2022-02-10 LAB — CBC WITH DIFFERENTIAL (CANCER CENTER ONLY)
Abs Immature Granulocytes: 0.01 10*3/uL (ref 0.00–0.07)
Basophils Absolute: 0 10*3/uL (ref 0.0–0.1)
Basophils Relative: 1 %
Eosinophils Absolute: 0.2 10*3/uL (ref 0.0–0.5)
Eosinophils Relative: 3 %
HCT: 35.3 % — ABNORMAL LOW (ref 36.0–46.0)
Hemoglobin: 11.7 g/dL — ABNORMAL LOW (ref 12.0–15.0)
Immature Granulocytes: 0 %
Lymphocytes Relative: 17 %
Lymphs Abs: 1.1 10*3/uL (ref 0.7–4.0)
MCH: 28.5 pg (ref 26.0–34.0)
MCHC: 33.1 g/dL (ref 30.0–36.0)
MCV: 85.9 fL (ref 80.0–100.0)
Monocytes Absolute: 0.5 10*3/uL (ref 0.1–1.0)
Monocytes Relative: 7 %
Neutro Abs: 4.8 10*3/uL (ref 1.7–7.7)
Neutrophils Relative %: 72 %
Platelet Count: 254 10*3/uL (ref 150–400)
RBC: 4.11 MIL/uL (ref 3.87–5.11)
RDW: 15.5 % (ref 11.5–15.5)
WBC Count: 6.6 10*3/uL (ref 4.0–10.5)
nRBC: 0 % (ref 0.0–0.2)

## 2022-02-10 LAB — CMP (CANCER CENTER ONLY)
ALT: 21 U/L (ref 0–44)
AST: 22 U/L (ref 15–41)
Albumin: 4.3 g/dL (ref 3.5–5.0)
Alkaline Phosphatase: 96 U/L (ref 38–126)
Anion gap: 6 (ref 5–15)
BUN: 16 mg/dL (ref 8–23)
CO2: 28 mmol/L (ref 22–32)
Calcium: 9.6 mg/dL (ref 8.9–10.3)
Chloride: 106 mmol/L (ref 98–111)
Creatinine: 1.01 mg/dL — ABNORMAL HIGH (ref 0.44–1.00)
GFR, Estimated: >60 mL/min (ref >60)
Glucose, Bld: 120 mg/dL — ABNORMAL HIGH (ref 70–99)
Potassium: 3.6 mmol/L (ref 3.5–5.1)
Sodium: 140 mmol/L (ref 135–145)
Total Bilirubin: 0.3 mg/dL (ref 0.3–1.2)
Total Protein: 7.4 g/dL (ref 6.5–8.1)

## 2022-02-10 LAB — TSH: TSH: 1.855 u[IU]/mL (ref 0.308–3.960)

## 2022-02-10 MED ORDER — HEPARIN SOD (PORK) LOCK FLUSH 100 UNIT/ML IV SOLN
500.0000 [IU] | Freq: Once | INTRAVENOUS | Status: AC | PRN
  Administered 2022-02-10: 500 [IU]

## 2022-02-10 MED ORDER — SODIUM CHLORIDE 0.9 % IV SOLN
200.0000 mg | Freq: Once | INTRAVENOUS | Status: AC
  Administered 2022-02-10: 200 mg via INTRAVENOUS
  Filled 2022-02-10: qty 200

## 2022-02-10 MED ORDER — SODIUM CHLORIDE 0.9% FLUSH
10.0000 mL | INTRAVENOUS | Status: DC | PRN
  Administered 2022-02-10: 10 mL via INTRAVENOUS

## 2022-02-10 MED ORDER — SODIUM CHLORIDE 0.9 % IV SOLN: Freq: Once | INTRAVENOUS | Status: AC

## 2022-02-10 MED ORDER — SODIUM CHLORIDE 0.9% FLUSH
10.0000 mL | INTRAVENOUS | Status: DC | PRN
  Administered 2022-02-10: 10 mL

## 2022-02-10 NOTE — Patient Instructions (Signed)
[facility]  Discharge Instructions: ?Thank you for choosing [facility] to provide your oncology and hematology care.  ? ?If you have a lab appointment with the Cancer Center, please go directly to the Cancer Center and check in at the registration area. ?  ?Wear comfortable clothing and clothing appropriate for easy access to any Portacath or PICC line.  ? ?We strive to give you quality time with your provider. You may need to reschedule your appointment if you arrive late (15 or more minutes).  Arriving late affects you and other patients whose appointments are after yours.  Also, if you miss three or more appointments without notifying the office, you may be dismissed from the clinic at the provider?s discretion.    ?  ?For prescription refill requests, have your pharmacy contact our office and allow 72 hours for refills to be completed.   ? ?Today you received the following chemotherapy and/or immunotherapy agents: Keytruda ?  ?To help prevent nausea and vomiting after your treatment, we encourage you to take your nausea medication as directed. ? ?BELOW ARE SYMPTOMS THAT SHOULD BE REPORTED IMMEDIATELY: ?*FEVER GREATER THAN 100.4 F (38 ?C) OR HIGHER ?*CHILLS OR SWEATING ?*NAUSEA AND VOMITING THAT IS NOT CONTROLLED WITH YOUR NAUSEA MEDICATION ?*UNUSUAL SHORTNESS OF BREATH ?*UNUSUAL BRUISING OR BLEEDING ?*URINARY PROBLEMS (pain or burning when urinating, or frequent urination) ?*BOWEL PROBLEMS (unusual diarrhea, constipation, pain near the anus) ?TENDERNESS IN MOUTH AND THROAT WITH OR WITHOUT PRESENCE OF ULCERS (sore throat, sores in mouth, or a toothache) ?UNUSUAL RASH, SWELLING OR PAIN  ?UNUSUAL VAGINAL DISCHARGE OR ITCHING  ? ?Items with * indicate a potential emergency and should be followed up as soon as possible or go to the Emergency Department if any problems should occur. ? ?Please show the CHEMOTHERAPY ALERT CARD or IMMUNOTHERAPY ALERT CARD at check-in to the  Emergency Department and triage nurse. ? ?Should you have questions after your visit or need to cancel or reschedule your appointment, please contact [facility]  Dept: [phone]  and follow the prompts.  Office hours are 8:00 a.m. to 4:30 p.m. Monday - Friday. Please note that voicemails left after 4:00 p.m. may not be returned until the following business day.  We are closed weekends and major holidays. You have access to a nurse at all times for urgent questions. Please call the main number to the clinic Dept: [phone] and follow the prompts. ? ? ?For any non-urgent questions, you may also contact your provider using MyChart. We now offer e-Visits for anyone 18 and older to request care online for non-urgent symptoms. For details visit [email or web address]. ?  ?Also download the MyChart app! Go to the app store, search "MyChart", open the app, select [facility], and log in with your MyChart username and password. ? ?Due to Covid, a mask is required upon entering the hospital/clinic. If you do not have a mask, one will be given to you upon arrival. For doctor visits, patients may have 1 support person aged 18 or older with them. For treatment visits, patients cannot have anyone with them due to current Covid guidelines and our immunocompromised population.  ? ?

## 2022-02-18 NOTE — Progress Notes (Incomplete)
? ?Patient Care Team: ?Jenel Lucks, PA-C as PCP - General (Internal Medicine) ?Rockwell Germany, RN as Oncology Nurse Navigator ?Mauro Kaufmann, RN as Oncology Nurse Navigator ?Stark Klein, MD as Consulting Physician (General Surgery) ?Nicholas Lose, MD as Consulting Physician (Hematology and Oncology) ? ?DIAGNOSIS: No diagnosis found. ? ?SUMMARY OF ONCOLOGIC HISTORY: ?Oncology History  ?Malignant neoplasm of upper-inner quadrant of right breast in female, estrogen receptor negative ([place])  ?06/03/2021 Initial Diagnosis  ? [place] woman status post right breast upper inner quadrant biopsy 06/03/2021 for a clinical T2N0 invasive ductal carcinoma, grade 3, functionally triple negative, with an MIB-1 of 95%. ?  ?06/09/2021 Cancer Staging  ? Staging form: Breast, AJCC 8th Edition ?- Clinical: Stage IIB (cT2, cN0, cM0, G3, ER-, PR-, HER2-) - Signed by Chauncey Cruel, MD on 06/09/2021 ?Histologic grading system: 3 grade system ? ?  ?06/23/2021 -  Neo-Adjuvant Chemotherapy  ? neoadjuvant chemotherapy consisting of carboplatin paclitaxel and pembrolizumab for 4 cycles starting 06/23/2021, to be followed by doxorubicin and cyclophosphamide and pembrolizumab for 4 cycles, with pembrolizumab to be continued to complete a year ?  ?06/30/2021 Genetic Testing  ? Patient has genetic testing done for personal history of breast cancer. ?No pathogenic variants were detected. Of note, a variant of uncertain significance was detected in the BRIP1 and TSC2 genes.  The genes analyzed include: APC, ATM, AXIN2, BARD1, BMPR1A, BRCA1, BRCA2, BRIP1, CDH1, CDK4*, CDKN2A (p14ARF)*, CDKN2A (p16INK4a)*, CHEK2, CTNNA1, DICER1, EPCAM (Deletion/duplication testing only), GREM1 (promoter region deletion/duplication testing only), KIT, MEN1, MLH1, MSH2, MSH3, MSH6, MUTYH, NBN, NF1, NHTL1, PALB2, PDGFRA*, PMS2, POLD1, POLE, PTEN, RAD50, RAD51C, RAD51D, SDHB, SDHC, SDHD, SMAD4, SMARCA4. STK11, TP53, TSC1, TSC2, and VHL.  The following  genes were evaluated for sequence changes only: SDHA and HOXB13 c.251G>A variant only.  ?  ?11/04/2021 - 11/06/2021 Hospital Admission  ? Neutropenia related pneumonia ?  ?01/01/2022 -  Chemotherapy  ? Patient is on Treatment Plan : BREAST Pembrolizumab (200) q21d x 27 weeks  ? ?  ?  ? ? ?CHIEF COMPLIANT: Follow-up of triple negative breast cancer ? ?INTERVAL HISTORY: Rita Davis is a 61 y.o. with above-mentioned history of triple negative breast cancer, currently on chemotherapy with Adriamycin, Cytoxan, Keytruda. She presents to the clinic today for follow-up. ? ? ?ALLERGIES:  has No Known Allergies. ? ?MEDICATIONS:  ?Current Outpatient Medications  ?Medication Sig Dispense Refill  ? acetaminophen (TYLENOL) 500 MG tablet Take 1,000 mg by mouth every 6 (six) hours as needed for mild pain.    ? amLODipine-olmesartan (AZOR) 5-40 MG tablet Take 1 tablet by mouth in the morning.    ? Cholecalciferol (VITAMIN D3) 250 MCG (10000 UT) TABS Take 10,000 Units by mouth daily.    ? gabapentin (NEURONTIN) 100 MG capsule Take 100 mg by mouth 2 (two) times daily.    ? Misc Natural Products (SAMBUCUS ELDERBERRY IMMUNE PO) Take 5 mLs by mouth daily.    ? Misc Natural Products (SUPER GREENS) POWD Take 1 Scoop by mouth daily.    ? oxyCODONE (OXY IR/ROXICODONE) 5 MG immediate release tablet Take 1 tablet (5 mg total) by mouth every 6 (six) hours as needed for severe pain. 10 tablet 0  ? rosuvastatin (CRESTOR) 20 MG tablet Take 1 tablet (20 mg total) by mouth daily.    ? ?No current facility-administered medications for this visit.  ? ? ?PHYSICAL EXAMINATION: ?ECOG PERFORMANCE STATUS: {CHL ONC ECOG TU:[id number]} ? ?There were no vitals filed for this visit. ?There were no vitals  filed for this visit. ? ?BREAST:*** No palpable masses or nodules in either right or left breasts. No palpable axillary supraclavicular or infraclavicular adenopathy no breast tenderness or nipple discharge. (exam performed in the presence of a  chaperone) ? ?LABORATORY DATA:  ?I have reviewed the data as listed ? ?  Latest Ref Rng & Units 02/10/2022  ?  8:47 AM 01/20/2022  ? 11:21 AM 01/01/2022  ?  9:17 AM  ?CMP  ?Glucose 70 - 99 mg/dL 120   112   98    ?BUN 8 - 23 mg/dL '16   16   15    ' ?Creatinine 0.44 - 1.00 mg/dL 1.01   0.96   0.87    ?Sodium 135 - 145 mmol/L 140   142   140    ?Potassium 3.5 - 5.1 mmol/L 3.6   3.6   3.6    ?Chloride 98 - 111 mmol/L 106   107   106    ?CO2 22 - 32 mmol/L '28   28   27    ' ?Calcium 8.9 - 10.3 mg/dL 9.6   9.9   9.7    ?Total Protein 6.5 - 8.1 g/dL 7.4   7.1   6.8    ?Total Bilirubin 0.3 - 1.2 mg/dL 0.3   0.4   0.3    ?Alkaline Phos 38 - 126 U/L 96   99   81    ?AST 15 - 41 U/L '22   25   21    ' ?ALT 0 - 44 U/L '21   22   21    ' ? ? ?Lab Results  ?Component Value Date  ? WBC 6.6 02/10/2022  ? HGB 11.7 (L) 02/10/2022  ? HCT 35.3 (L) 02/10/2022  ? MCV 85.9 02/10/2022  ? PLT 254 02/10/2022  ? NEUTROABS 4.8 02/10/2022  ? ? ?ASSESSMENT & PLAN:  ?No problem-specific Assessment & Plan notes found for this encounter. ? ? ? ?No orders of the defined types were placed in this encounter. ? ?The patient has a good understanding of the overall plan. she agrees with it. she will call with any problems that may develop before the next visit here. ?Total time spent: 30 mins including face to face time and time spent for planning, charting and co-ordination of care ? ? Suzzette Righter, CMA ?02/18/22 ? ? ? I Kadan Millstein, Maila Dukes am scribing for Dr. Lindi Adie ? ?***  ?

## 2022-02-26 ENCOUNTER — Emergency Department (HOSPITAL_COMMUNITY)
Admission: EM | Admit: 2022-02-26 | Discharge: 2022-02-26 | Disposition: A | Discharge status: Home / Self Care | Payer: BC Managed Care – PPO | Attending: Emergency Medicine | Admitting: Emergency Medicine

## 2022-02-26 ENCOUNTER — Other Ambulatory Visit: Payer: Self-pay | Admitting: *Deleted

## 2022-02-26 ENCOUNTER — Other Ambulatory Visit: Payer: Self-pay

## 2022-02-26 ENCOUNTER — Encounter (HOSPITAL_COMMUNITY): Payer: Self-pay

## 2022-02-26 ENCOUNTER — Ambulatory Visit
Admission: RE | Admit: 2022-02-26 | Discharge: 2022-02-26 | Disposition: A | Discharge status: Home / Self Care | Payer: BC Managed Care – PPO | Source: Ambulatory Visit | Attending: Radiation Oncology | Admitting: Radiation Oncology

## 2022-02-26 ENCOUNTER — Emergency Department (HOSPITAL_COMMUNITY): Payer: BC Managed Care – PPO

## 2022-02-26 DIAGNOSIS — Z79899 Other long term (current) drug therapy: Secondary | ICD-10-CM | POA: Diagnosis not present

## 2022-02-26 DIAGNOSIS — R051 Acute cough: Secondary | ICD-10-CM | POA: Insufficient documentation

## 2022-02-26 DIAGNOSIS — C50211 Malignant neoplasm of upper-inner quadrant of right female breast: Secondary | ICD-10-CM

## 2022-02-26 DIAGNOSIS — I10 Essential (primary) hypertension: Secondary | ICD-10-CM | POA: Diagnosis not present

## 2022-02-26 DIAGNOSIS — Z853 Personal history of malignant neoplasm of breast: Secondary | ICD-10-CM | POA: Diagnosis not present

## 2022-02-26 DIAGNOSIS — R059 Cough, unspecified: Secondary | ICD-10-CM | POA: Diagnosis present

## 2022-02-26 IMAGING — CR DG CHEST 2V
2 series · 2 of 2 positions shown · non-contrast
Comparison: [DATE]

CLINICAL DATA: Cough for 2 days.  History of breast cancer.

EXAM:
CHEST - 2 VIEW

[w chest pa]
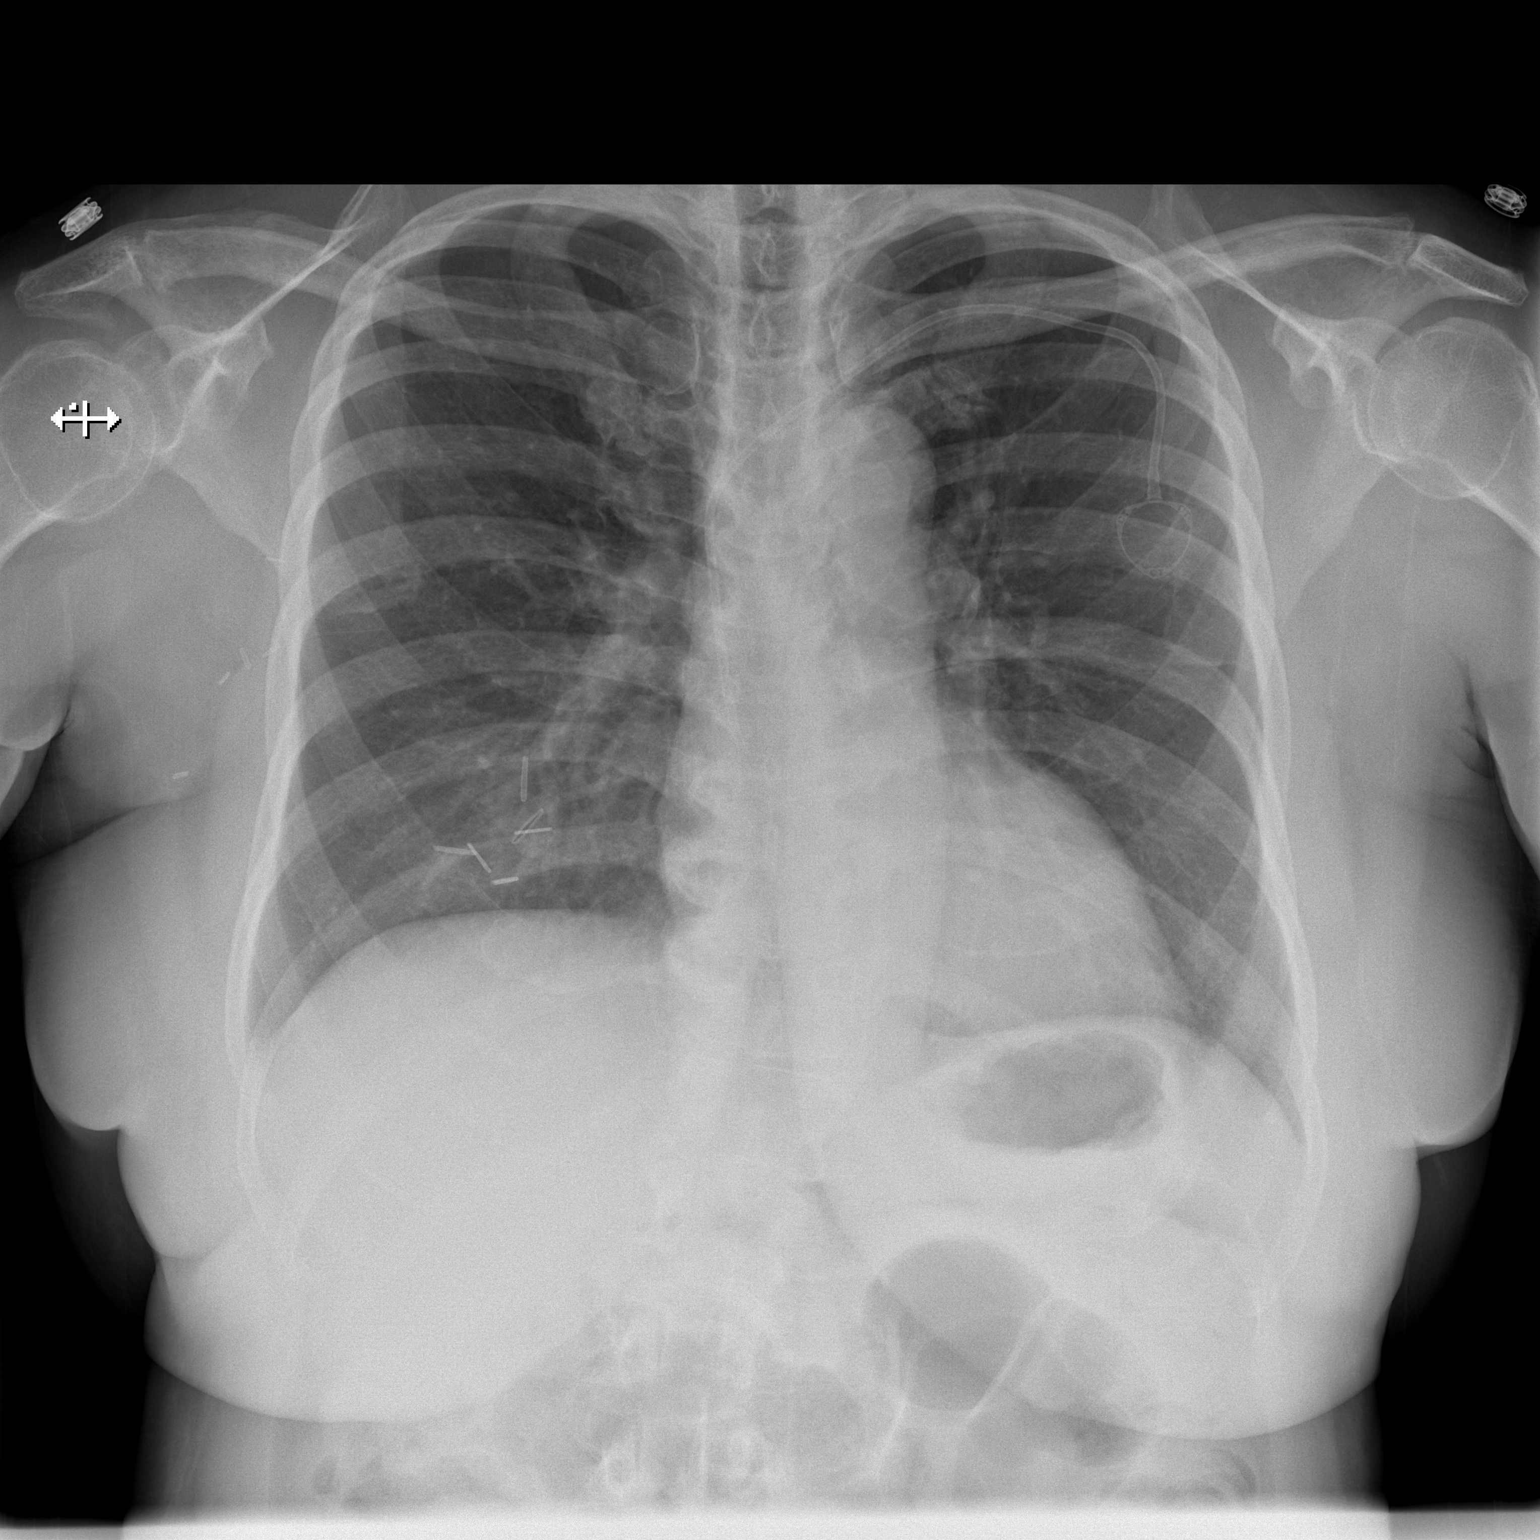

[w chest lat]
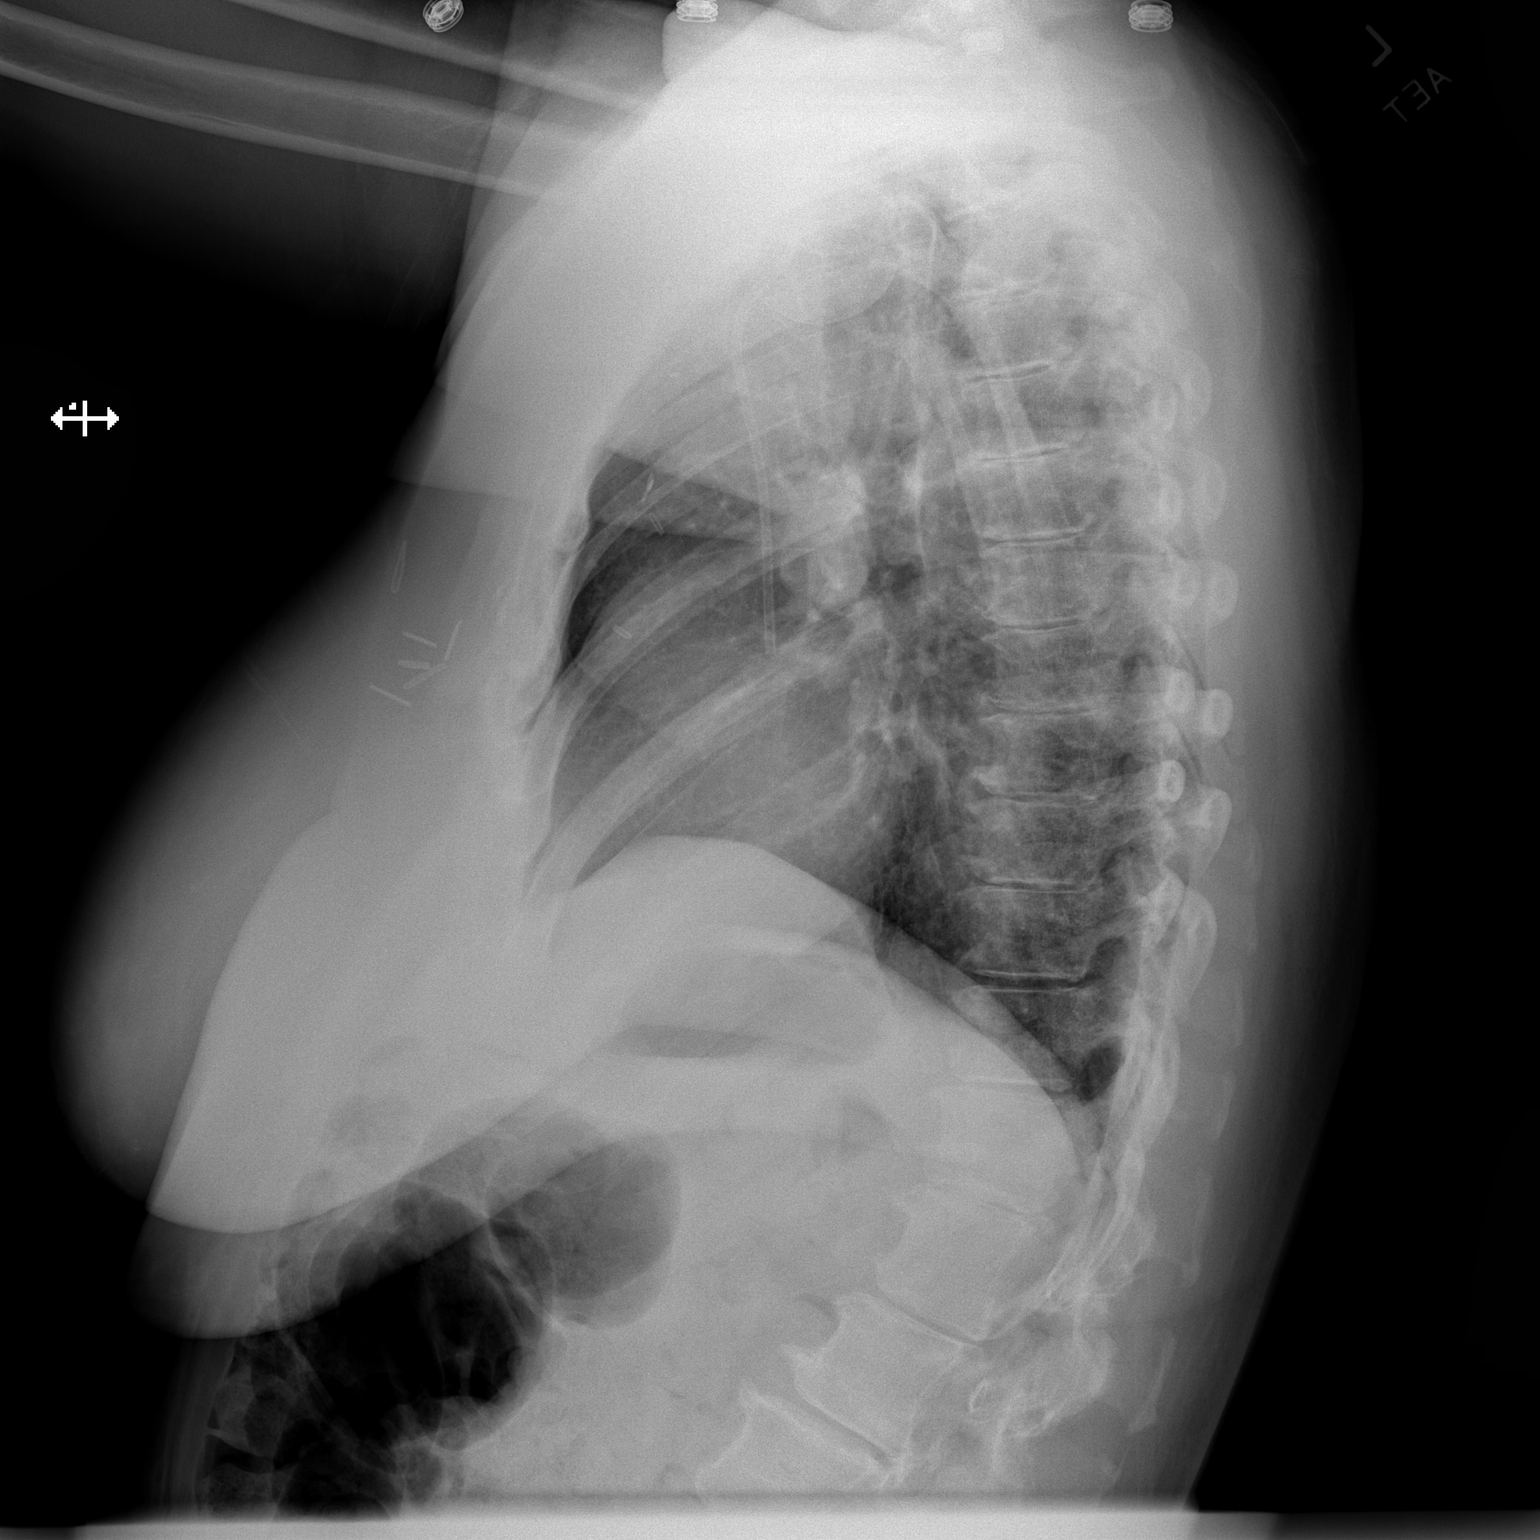

[2 of 2 positions shown; findings below may reference images not displayed]

FINDINGS: Left chest wall port a catheter is identified with tip in the
cavoatrial junction. Normal cardiomediastinal contours. No pleural
effusion or edema. No airspace densities identified. Surgical clips
identified within the right breast. Spondylosis is noted within the
visualized thoracolumbar spine.
IMPRESSION: No active cardiopulmonary abnormalities.

## 2022-02-26 MED ORDER — BENZONATATE 100 MG PO CAPS
100.0000 mg | ORAL_CAPSULE | Freq: Three times a day (TID) | ORAL | 0 refills | Status: DC | PRN
Start: 2022-02-26 — End: 2022-06-08

## 2022-02-26 NOTE — Progress Notes (Signed)
Chest x ray order removed due to pt currently being treated in the ED. ?

## 2022-02-26 NOTE — ED Provider Notes (Signed)
?[facility] DEPT ?Provider Note ? ? ?CSN: [id number] ?Arrival date & time: 02/26/22  1106 ? ?  ? ?History ? ?Chief Complaint  ?Patient presents with  ? Cough  ? ? ?Rita Davis is a 61 y.o. female. ? ?HPI ?61 year old female presents with cough.  She has been coughing for 2 days since coming back from [place].  Nonproductive and worse when she lays flat.  She has not taken any medicines from it.  She came from the cancer center because they were doing scanning and when she was laying flat she was coughing and wanted to get a chest x-ray to make sure she did not have pneumonia.  Denies fevers, chest pain, shortness of breath, leg swelling, history of DVT. ? ?Home Medications ?Prior to Admission medications   ?Medication Sig Start Date End Date Taking? Authorizing Provider  ?benzonatate (TESSALON) 100 MG capsule Take 1 capsule (100 mg total) by mouth 3 (three) times daily as needed for cough. 02/26/22  Yes Sherwood Gambler, MD  ?acetaminophen (TYLENOL) 500 MG tablet Take 1,000 mg by mouth every 6 (six) hours as needed for mild pain.    [provider]  ?amLODipine-olmesartan (AZOR) 5-40 MG tablet Take 1 tablet by mouth in the morning.    [provider]  ?Cholecalciferol (VITAMIN D3) 250 MCG (10000 UT) TABS Take 10,000 Units by mouth daily.    [provider]  ?gabapentin (NEURONTIN) 100 MG capsule Take 100 mg by mouth 2 (two) times daily.    [provider]  ?Misc Natural Products (SAMBUCUS ELDERBERRY IMMUNE PO) Take 5 mLs by mouth daily.    [provider]  ?Misc Natural Products (SUPER GREENS) POWD Take 1 Scoop by mouth daily.    [provider]  ?oxyCODONE (OXY IR/ROXICODONE) 5 MG immediate release tablet Take 1 tablet (5 mg total) by mouth every 6 (six) hours as needed for severe pain. 12/21/21   Stark Klein, MD  ?rosuvastatin (CRESTOR) 20 MG tablet Take 1 tablet (20 mg total) by mouth daily. 08/26/21   Magrinat, Virgie Dad, MD   ?prochlorperazine (COMPAZINE) 10 MG tablet Take 1 tablet (10 mg total) by mouth every 6 (six) hours as needed (Nausea or vomiting). 06/09/21 01/01/22  Magrinat, Virgie Dad, MD  ?   ? ?Allergies    ?Patient has no known allergies.   ? ?Review of Systems   ?Review of Systems  ?Constitutional:  Negative for fever.  ?Respiratory:  Positive for cough. Negative for shortness of breath.   ?Cardiovascular:  Negative for chest pain and leg swelling.  ? ?Physical Exam ?Updated Vital Signs ?BP (!) 160/88 (BP Location: Right Arm)   Pulse 78   Temp 97.6 ?F (36.4 ?C) (Oral)   Resp 16   Ht '5\' 8"'$  (1.727 m)   Wt 84 kg   SpO2 99%   BMI 28.16 kg/m?  ?Physical Exam ?Vitals and nursing note reviewed.  ?Constitutional:   ?   Appearance: She is well-developed.  ?HENT:  ?   Head: Normocephalic and atraumatic.  ?Cardiovascular:  ?   Rate and Rhythm: Normal rate and regular rhythm.  ?   Heart sounds: Normal heart sounds.  ?Pulmonary:  ?   Effort: Pulmonary effort is normal.  ?   Breath sounds: Normal breath sounds. No wheezing, rhonchi or rales.  ?Abdominal:  ?   General: There is no distension.  ?Musculoskeletal:  ?   Right lower leg: No edema.  ?   Left lower leg: No edema.  ?  Skin: ?   General: Skin is warm and dry.  ?Neurological:  ?   Mental Status: She is alert.  ? ? ?ED Results / Procedures / Treatments   ?Labs ?(all labs ordered are listed, but only abnormal results are displayed) ?Labs Reviewed - No data to display ? ?EKG ?None ? ?Radiology ?DG Chest 2 View ? ?Result Date: 02/26/2022 ?CLINICAL DATA:  Cough for 2 days.  History of breast cancer. EXAM: CHEST - 2 VIEW COMPARISON:  06/18/2021 FINDINGS: Left chest wall port a catheter is identified with tip in the cavoatrial junction. Normal cardiomediastinal contours. No pleural effusion or edema. No airspace densities identified. Surgical clips identified within the right breast. Spondylosis is noted within the visualized thoracolumbar spine. IMPRESSION: No active cardiopulmonary  abnormalities. Electronically Signed   By: Kerby Moors M.D.   On: 02/26/2022 12:56   ? ?Procedures ?Procedures  ? ? ?Medications Ordered in ED ?Medications - No data to display ? ?ED Course/ Medical Decision Making/ A&P ?  ?                        ?Medical Decision Making ?Amount and/or Complexity of Data Reviewed ?External Data Reviewed: notes. ?Radiology: ordered and independent interpretation performed. ? ?Risk ?Prescription drug management. ? ? ?Patient is about to start therapy for breast cancer and wanted to make sure she was okay before starting this.  Chest x-ray images viewed and there is no obvious pneumonia on my read.  She declines COVID test.  Chart reviewed.  At this point, vitals show some hypertension but no other significant abnormalities.  I doubt PE, occult pneumonia, etc.  We will treat with Tessalon Perle and Mucinex as an outpatient.  Given return precautions. ? ? ? ? ? ? ? ?Final Clinical Impression(s) / ED Diagnoses ?Final diagnoses:  ?Acute cough  ? ? ?Rx / DC Orders ?ED Discharge Orders   ? ?      Ordered  ?  benzonatate (TESSALON) 100 MG capsule  3 times daily PRN       ? 02/26/22 1322  ? ?  ?  ? ?  ? ? ?  ?Sherwood Gambler, MD ?02/26/22 1435 ? ?

## 2022-02-26 NOTE — ED Notes (Addendum)
Pt had phone call from [facility] from cancer center to state that pt may come get her xray with them instead of ED if she would like to.  Pt elected to remain and be evaluated in ED instead of returning to cancer center.   ?

## 2022-02-26 NOTE — Discharge Instructions (Signed)
If you develop high fever, severe cough or cough with blood, trouble breathing, severe headache, neck pain/stiffness, vomiting, or any other new/concerning symptoms then return to the ER for evaluation  

## 2022-02-26 NOTE — ED Triage Notes (Signed)
Patient states she has breast cancer. Patient states she is currently taking Keytruda.  ?Patient states she was on a flight from [place] 4 days ago and is now having a non productive cough x 2 days. ?

## 2022-02-26 NOTE — Progress Notes (Signed)
Pt stopped nurse in lobby complaining of slight dry cough since recent travel, denies fever, mucus, or SOB.  Pt stated she wanted "medication to prevent another 35k hospital bill" .  LPN advised pt, per MD, to seek care at PCP office or urgent care, covid test, and report change of symptoms.  Pt requested this nurse call PCP, urgent care and ED.  I advised pt that she could call her primary care and report concerns, pt stated she had an established appointment with them next week.  Pt extremely upset and left the cancer center.  ? ?LPN reviewed with MD regarding pt complaint of ongoing cough post recent travel.  Verbal orders received from MD to obtain Chest Xray for further evaluation.  Orders placed. This nurse placed call to pt, pt states she is currently in room 6 at [facility] ED undergoing evaluation.  Pt requesting advice on what the cost for ED admission vs evaluation with Chest Xray.  This nurse explained to pt that costs are unknown and is based on patient insurance coverage and it is the patients decision if she would prefer to stay in the ED for workup or check in at the main entrance of [facility] for Chest Xray. This nurse overheard ED RN discussing with pt and pt hung up the phone.  This nurse received a message from ED RN stating pt requesting to stay in the ED for further evaluation and treatment.  MD notified and verbalized understanding ?

## 2022-03-02 DIAGNOSIS — Z171 Estrogen receptor negative status [ER-]: Secondary | ICD-10-CM | POA: Diagnosis not present

## 2022-03-02 DIAGNOSIS — Z5112 Encounter for antineoplastic immunotherapy: Secondary | ICD-10-CM | POA: Diagnosis present

## 2022-03-02 DIAGNOSIS — C50211 Malignant neoplasm of upper-inner quadrant of right female breast: Secondary | ICD-10-CM | POA: Insufficient documentation

## 2022-03-02 DIAGNOSIS — Z79899 Other long term (current) drug therapy: Secondary | ICD-10-CM | POA: Insufficient documentation

## 2022-03-03 ENCOUNTER — Telehealth: Payer: Self-pay | Admitting: Hematology and Oncology

## 2022-03-03 NOTE — Telephone Encounter (Signed)
Per 5/3 phone lines pt called and stated she would not be here for her next appointment.  I rescheduled  the appointment per pt request.  Pt confirmed the new appointment  ?

## 2022-03-04 ENCOUNTER — Inpatient Hospital Stay: Payer: BC Managed Care – PPO

## 2022-03-04 ENCOUNTER — Inpatient Hospital Stay: Payer: BC Managed Care – PPO | Admitting: Hematology and Oncology

## 2022-03-05 ENCOUNTER — Encounter: Payer: Self-pay | Admitting: *Deleted

## 2022-03-09 ENCOUNTER — Other Ambulatory Visit: Payer: Self-pay

## 2022-03-09 ENCOUNTER — Ambulatory Visit
Admission: RE | Admit: 2022-03-09 | Discharge: 2022-03-09 | Disposition: A | Discharge status: Home / Self Care | Payer: BC Managed Care – PPO | Source: Ambulatory Visit | Attending: Radiation Oncology | Admitting: Radiation Oncology

## 2022-03-09 DIAGNOSIS — Z5112 Encounter for antineoplastic immunotherapy: Secondary | ICD-10-CM | POA: Diagnosis not present

## 2022-03-09 LAB — RAD ONC ARIA SESSION SUMMARY
Course Elapsed Days: 0
Plan Fractions Treated to Date: 1
Plan Prescribed Dose Per Fraction: 2.66 Gy
Plan Total Fractions Prescribed: 16
Plan Total Prescribed Dose: 42.56 Gy
Reference Point Dosage Given to Date: 2.66 Gy
Reference Point Session Dosage Given: 2.66 Gy
Session Number: 1

## 2022-03-10 ENCOUNTER — Other Ambulatory Visit: Payer: Self-pay

## 2022-03-10 ENCOUNTER — Ambulatory Visit
Admission: RE | Admit: 2022-03-10 | Discharge: 2022-03-10 | Disposition: A | Discharge status: Home / Self Care | Payer: BC Managed Care – PPO | Source: Ambulatory Visit | Attending: Radiation Oncology | Admitting: Radiation Oncology

## 2022-03-10 DIAGNOSIS — Z5112 Encounter for antineoplastic immunotherapy: Secondary | ICD-10-CM | POA: Diagnosis not present

## 2022-03-10 LAB — RAD ONC ARIA SESSION SUMMARY
Course Elapsed Days: 1
Plan Fractions Treated to Date: 2
Plan Prescribed Dose Per Fraction: 2.66 Gy
Plan Total Fractions Prescribed: 16
Plan Total Prescribed Dose: 42.56 Gy
Reference Point Dosage Given to Date: 5.32 Gy
Reference Point Session Dosage Given: 2.66 Gy
Session Number: 2

## 2022-03-11 ENCOUNTER — Other Ambulatory Visit: Payer: Self-pay

## 2022-03-11 ENCOUNTER — Ambulatory Visit
Admission: RE | Admit: 2022-03-11 | Discharge: 2022-03-11 | Disposition: A | Discharge status: Home / Self Care | Payer: BC Managed Care – PPO | Source: Ambulatory Visit | Attending: Radiation Oncology | Admitting: Radiation Oncology

## 2022-03-11 DIAGNOSIS — Z5112 Encounter for antineoplastic immunotherapy: Secondary | ICD-10-CM | POA: Diagnosis not present

## 2022-03-11 LAB — RAD ONC ARIA SESSION SUMMARY
Course Elapsed Days: 2
Plan Fractions Treated to Date: 3
Plan Prescribed Dose Per Fraction: 2.66 Gy
Plan Total Fractions Prescribed: 16
Plan Total Prescribed Dose: 42.56 Gy
Reference Point Dosage Given to Date: 7.98 Gy
Reference Point Session Dosage Given: 2.66 Gy
Session Number: 3

## 2022-03-11 NOTE — Progress Notes (Signed)
Pt here for patient teaching.  Pt given Radiation and You booklet, skin care instructions, alra deodorant and Radiaplex.    Reviewed areas of pertinence such as fatigue, hair loss, skin changes, breast tenderness, and breast swelling. Pt able to give teach back of to pat skin and use unscented/gentle soap,apply Radiaplex bid, avoid applying anything to skin within 4 hours of treatment, avoid wearing an under wire bra, and to use an electric razor if they must shave. Pt verbalizes understanding of information given and will contact nursing with any questions or concerns.   ?

## 2022-03-12 ENCOUNTER — Other Ambulatory Visit: Payer: Self-pay

## 2022-03-12 ENCOUNTER — Ambulatory Visit
Admission: RE | Admit: 2022-03-12 | Discharge: 2022-03-12 | Disposition: A | Discharge status: Home / Self Care | Payer: BC Managed Care – PPO | Source: Ambulatory Visit | Attending: Radiation Oncology | Admitting: Radiation Oncology

## 2022-03-12 DIAGNOSIS — Z171 Estrogen receptor negative status [ER-]: Secondary | ICD-10-CM

## 2022-03-12 DIAGNOSIS — Z5112 Encounter for antineoplastic immunotherapy: Secondary | ICD-10-CM | POA: Diagnosis not present

## 2022-03-12 LAB — RAD ONC ARIA SESSION SUMMARY
Course Elapsed Days: 3
Plan Fractions Treated to Date: 4
Plan Prescribed Dose Per Fraction: 2.66 Gy
Plan Total Fractions Prescribed: 16
Plan Total Prescribed Dose: 42.56 Gy
Reference Point Dosage Given to Date: 10.64 Gy
Reference Point Session Dosage Given: 2.66 Gy
Session Number: 4

## 2022-03-12 MED ORDER — ALRA NON-METALLIC DEODORANT (RAD-ONC)
1.0000 "application " | Freq: Once | TOPICAL | Status: AC
  Administered 2022-03-12: 1 via TOPICAL

## 2022-03-12 MED ORDER — RADIAPLEXRX EX GEL: Freq: Once | CUTANEOUS | Status: AC

## 2022-03-15 ENCOUNTER — Ambulatory Visit
Admission: RE | Admit: 2022-03-15 | Discharge: 2022-03-15 | Disposition: A | Discharge status: Home / Self Care | Payer: BC Managed Care – PPO | Source: Ambulatory Visit | Attending: Radiation Oncology | Admitting: Radiation Oncology

## 2022-03-15 ENCOUNTER — Inpatient Hospital Stay: Payer: BC Managed Care – PPO | Attending: Genetic Counselor

## 2022-03-15 ENCOUNTER — Other Ambulatory Visit: Payer: Self-pay

## 2022-03-15 ENCOUNTER — Inpatient Hospital Stay: Payer: BC Managed Care – PPO

## 2022-03-15 ENCOUNTER — Inpatient Hospital Stay: Payer: BC Managed Care – PPO | Admitting: Hematology and Oncology

## 2022-03-15 DIAGNOSIS — Z171 Estrogen receptor negative status [ER-]: Secondary | ICD-10-CM | POA: Diagnosis not present

## 2022-03-15 DIAGNOSIS — Z95828 Presence of other vascular implants and grafts: Secondary | ICD-10-CM

## 2022-03-15 DIAGNOSIS — Z5112 Encounter for antineoplastic immunotherapy: Secondary | ICD-10-CM | POA: Diagnosis not present

## 2022-03-15 DIAGNOSIS — C50211 Malignant neoplasm of upper-inner quadrant of right female breast: Secondary | ICD-10-CM

## 2022-03-15 LAB — CMP (CANCER CENTER ONLY)
ALT: 23 U/L (ref 0–44)
AST: 25 U/L (ref 15–41)
Albumin: 4.1 g/dL (ref 3.5–5.0)
Alkaline Phosphatase: 67 U/L (ref 38–126)
Anion gap: 6 (ref 5–15)
BUN: 15 mg/dL (ref 8–23)
CO2: 28 mmol/L (ref 22–32)
Calcium: 9.4 mg/dL (ref 8.9–10.3)
Chloride: 105 mmol/L (ref 98–111)
Creatinine: 0.91 mg/dL (ref 0.44–1.00)
GFR, Estimated: >60 mL/min (ref >60)
Glucose, Bld: 103 mg/dL — ABNORMAL HIGH (ref 70–99)
Potassium: 3.8 mmol/L (ref 3.5–5.1)
Sodium: 139 mmol/L (ref 135–145)
Total Bilirubin: 0.4 mg/dL (ref 0.3–1.2)
Total Protein: 7.1 g/dL (ref 6.5–8.1)

## 2022-03-15 LAB — CBC WITH DIFFERENTIAL (CANCER CENTER ONLY)
Abs Immature Granulocytes: 0.01 10*3/uL (ref 0.00–0.07)
Basophils Absolute: 0 10*3/uL (ref 0.0–0.1)
Basophils Relative: 1 %
Eosinophils Absolute: 0.2 10*3/uL (ref 0.0–0.5)
Eosinophils Relative: 5 %
HCT: 34.2 % — ABNORMAL LOW (ref 36.0–46.0)
Hemoglobin: 11.6 g/dL — ABNORMAL LOW (ref 12.0–15.0)
Immature Granulocytes: 0 %
Lymphocytes Relative: 19 %
Lymphs Abs: 0.8 10*3/uL (ref 0.7–4.0)
MCH: 29.2 pg (ref 26.0–34.0)
MCHC: 33.9 g/dL (ref 30.0–36.0)
MCV: 86.1 fL (ref 80.0–100.0)
Monocytes Absolute: 0.3 10*3/uL (ref 0.1–1.0)
Monocytes Relative: 7 %
Neutro Abs: 3.1 10*3/uL (ref 1.7–7.7)
Neutrophils Relative %: 68 %
Platelet Count: 259 10*3/uL (ref 150–400)
RBC: 3.97 MIL/uL (ref 3.87–5.11)
RDW: 14.4 % (ref 11.5–15.5)
WBC Count: 4.5 10*3/uL (ref 4.0–10.5)
nRBC: 0 % (ref 0.0–0.2)

## 2022-03-15 LAB — RAD ONC ARIA SESSION SUMMARY
Course Elapsed Days: 6
Plan Fractions Treated to Date: 5
Plan Prescribed Dose Per Fraction: 2.66 Gy
Plan Total Fractions Prescribed: 16
Plan Total Prescribed Dose: 42.56 Gy
Reference Point Dosage Given to Date: 13.3 Gy
Reference Point Session Dosage Given: 2.66 Gy
Session Number: 5

## 2022-03-15 LAB — TSH: TSH: 1.82 u[IU]/mL (ref 0.350–4.500)

## 2022-03-15 MED ORDER — SODIUM CHLORIDE 0.9 % IV SOLN
200.0000 mg | Freq: Once | INTRAVENOUS | Status: AC
  Administered 2022-03-15: 200 mg via INTRAVENOUS
  Filled 2022-03-15: qty 8

## 2022-03-15 MED ORDER — HEPARIN SOD (PORK) LOCK FLUSH 100 UNIT/ML IV SOLN
500.0000 [IU] | Freq: Once | INTRAVENOUS | Status: AC | PRN
  Administered 2022-03-15: 500 [IU]

## 2022-03-15 MED ORDER — SODIUM CHLORIDE 0.9% FLUSH
10.0000 mL | INTRAVENOUS | Status: DC | PRN
  Administered 2022-03-15: 10 mL

## 2022-03-15 MED ORDER — SODIUM CHLORIDE 0.9 % IV SOLN: Freq: Once | INTRAVENOUS | Status: AC

## 2022-03-15 MED ORDER — SODIUM CHLORIDE 0.9% FLUSH
10.0000 mL | INTRAVENOUS | Status: DC | PRN
  Administered 2022-03-15: 10 mL via INTRAVENOUS

## 2022-03-15 NOTE — Patient Instructions (Signed)
[facility] MEDICAL ONCOLOGY  Discharge Instructions: Thank you for choosing [facility] to provide your oncology and hematology care.   If you have a lab appointment with the Cancer Center, please go directly to the Cancer Center and check in at the registration area.   Wear comfortable clothing and clothing appropriate for easy access to any Portacath or PICC line.   We strive to give you quality time with your provider. You may need to reschedule your appointment if you arrive late (15 or more minutes).  Arriving late affects you and other patients whose appointments are after yours.  Also, if you miss three or more appointments without notifying the office, you may be dismissed from the clinic at the provider's discretion.      For prescription refill requests, have your pharmacy contact our office and allow 72 hours for refills to be completed.    Today you received the following chemotherapy and/or immunotherapy agents: keytruda      To help prevent nausea and vomiting after your treatment, we encourage you to take your nausea medication as directed.  BELOW ARE SYMPTOMS THAT SHOULD BE REPORTED IMMEDIATELY: *FEVER GREATER THAN 100.4 F (38 C) OR HIGHER *CHILLS OR SWEATING *NAUSEA AND VOMITING THAT IS NOT CONTROLLED WITH YOUR NAUSEA MEDICATION *UNUSUAL SHORTNESS OF BREATH *UNUSUAL BRUISING OR BLEEDING *URINARY PROBLEMS (pain or burning when urinating, or frequent urination) *BOWEL PROBLEMS (unusual diarrhea, constipation, pain near the anus) TENDERNESS IN MOUTH AND THROAT WITH OR WITHOUT PRESENCE OF ULCERS (sore throat, sores in mouth, or a toothache) UNUSUAL RASH, SWELLING OR PAIN  UNUSUAL VAGINAL DISCHARGE OR ITCHING   Items with * indicate a potential emergency and should be followed up as soon as possible or go to the Emergency Department if any problems should occur.  Please show the CHEMOTHERAPY ALERT CARD or IMMUNOTHERAPY ALERT CARD at check-in to  the Emergency Department and triage nurse.  Should you have questions after your visit or need to cancel or reschedule your appointment, please contact [facility]ICAL [facility]COL[facility]  Dept: [phone]  and follow the prompts.  Office hours are 8:00 a.m. to 4:30 p.m. Monday - Friday. Please note that voicemails left after 4:00 p.m. may not be returned until the following business day.  We are closed weekends and major holidays. You have access to a nurse at all times for urgent questions. Please call the main number to the clinic Dept: [phone] and follow the prompts.   For any non-urgent questions, you may also contact your provider using MyChart. We now offer e-Visits for anyone 18 and older to request care online for non-urgent symptoms. For details visit [email or web address].   Also download the MyChart app! Go to the app store, search "MyChart", open the app, select , and log in with your MyChart username and password.  Due to Covid, a mask is required upon entering the hospital/clinic. If you do not have a mask, one will be given to you upon arrival. For doctor visits, patients may have 1 support person aged 18 or older with them. For treatment visits, patients cannot have anyone with them due to current Covid guidelines and our immunocompromised population.   

## 2022-03-15 NOTE — Assessment & Plan Note (Signed)
status post right breast upper inner quadrant biopsy 06/03/2021 for a clinical T2N0 3.3 cm invasive ductal carcinoma, grade 3, functionally triple negative,?ER 5%, PR 0%, HER2 2+ by IHC FISH negative, Ki-67 95%? ?? ?Treatment plan:? ?1. Neoadjuvant chemotherapy consisting of carboplatin paclitaxel and pembrolizumab for 4 cycles starting 06/23/2021, to be followed by doxorubicin and cyclophosphamide and pembrolizumab for 4 cycles?completed 12/09/2021, with pembrolizumab to be continued to complete a year.?? ?2.?right Lumpectomy: 0.5 cm grade 2 IDC 0/4 LN Neg, Margins Neg ?3.??Adjuvant radiation therapy  ?------------------------------------------------------------------------------------------------------------------------------ ?Treatment plan: ? ?1. Adj XRT?started 03/10/2022 ?2. Keytruda?will continue every 3 weeks..? ?? ?RTC every 3 weeks for Keytruda and every 6 weeks for follow up with me ?She plans to go to [place] in April and will start radiation after she returns back from [place]. ?

## 2022-03-15 NOTE — Progress Notes (Signed)
? ?Patient Care Team: ?Jenel Lucks, PA-C as PCP - General (Internal Medicine) ?Rockwell Germany, RN as Oncology Nurse Navigator ?Mauro Kaufmann, RN as Oncology Nurse Navigator ?Stark Klein, MD as Consulting Physician (General Surgery) ?Nicholas Lose, MD as Consulting Physician (Hematology and Oncology) ? ?DIAGNOSIS:  ?Encounter Diagnosis  ?Name Primary?  ? Malignant neoplasm of upper-inner quadrant of right breast in female, estrogen receptor negative ([place])   ? ? ?SUMMARY OF ONCOLOGIC HISTORY: ?Oncology History  ?Malignant neoplasm of upper-inner quadrant of right breast in female, estrogen receptor negative ([place])  ?06/03/2021 Initial Diagnosis  ? [place] woman status post right breast upper inner quadrant biopsy 06/03/2021 for a clinical T2N0 invasive ductal carcinoma, grade 3, functionally triple negative, with an MIB-1 of 95%. ?  ?06/09/2021 Cancer Staging  ? Staging form: Breast, AJCC 8th Edition ?- Clinical: Stage IIB (cT2, cN0, cM0, G3, ER-, PR-, HER2-) - Signed by Chauncey Cruel, MD on 06/09/2021 ?Histologic grading system: 3 grade system ? ?  ?06/23/2021 -  Neo-Adjuvant Chemotherapy  ? neoadjuvant chemotherapy consisting of carboplatin paclitaxel and pembrolizumab for 4 cycles starting 06/23/2021, to be followed by doxorubicin and cyclophosphamide and pembrolizumab for 4 cycles, with pembrolizumab to be continued to complete a year ?  ?06/30/2021 Genetic Testing  ? Patient has genetic testing done for personal history of breast cancer. ?No pathogenic variants were detected. Of note, a variant of uncertain significance was detected in the BRIP1 and TSC2 genes.  The genes analyzed include: APC, ATM, AXIN2, BARD1, BMPR1A, BRCA1, BRCA2, BRIP1, CDH1, CDK4*, CDKN2A (p14ARF)*, CDKN2A (p16INK4a)*, CHEK2, CTNNA1, DICER1, EPCAM (Deletion/duplication testing only), GREM1 (promoter region deletion/duplication testing only), KIT, MEN1, MLH1, MSH2, MSH3, MSH6, MUTYH, NBN, NF1, NHTL1, PALB2, PDGFRA*,  PMS2, POLD1, POLE, PTEN, RAD50, RAD51C, RAD51D, SDHB, SDHC, SDHD, SMAD4, SMARCA4. STK11, TP53, TSC1, TSC2, and VHL.  The following genes were evaluated for sequence changes only: SDHA and HOXB13 c.251G>A variant only.  ?  ?11/04/2021 - 11/06/2021 Hospital Admission  ? Neutropenia related pneumonia ?  ?01/01/2022 -  Chemotherapy  ? Patient is on Treatment Plan : BREAST Pembrolizumab (200) q21d x 27 weeks  ? ?   ? ? ?CHIEF COMPLIANT: Follow-up of triple negative breast cancer ? ?INTERVAL HISTORY: Rita Davis is a  61 y.o. with above-mentioned history of triple negative breast cancer, currently on chemotherapy with Adriamycin, Cytoxan, Keytruda. She presents to the clinic today for a follow-up. She states that radiation is going well.  She is recovering from the effects of chemotherapy.  She went to [place] after chemo and appears to have had a good time. ? ? ?ALLERGIES:  has No Known Allergies. ? ?MEDICATIONS:  ?Current Outpatient Medications  ?Medication Sig Dispense Refill  ? acetaminophen (TYLENOL) 500 MG tablet Take 1,000 mg by mouth every 6 (six) hours as needed for mild pain.    ? amLODipine-olmesartan (AZOR) 5-40 MG tablet Take 1 tablet by mouth in the morning.    ? benzonatate (TESSALON) 100 MG capsule Take 1 capsule (100 mg total) by mouth 3 (three) times daily as needed for cough. 21 capsule 0  ? Cholecalciferol (VITAMIN D3) 250 MCG (10000 UT) TABS Take 10,000 Units by mouth daily.    ? gabapentin (NEURONTIN) 100 MG capsule Take 100 mg by mouth 2 (two) times daily.    ? Misc Natural Products (SAMBUCUS ELDERBERRY IMMUNE PO) Take 5 mLs by mouth daily.    ? Misc Natural Products (SUPER GREENS) POWD Take 1 Scoop by mouth daily.    ?  oxyCODONE (OXY IR/ROXICODONE) 5 MG immediate release tablet Take 1 tablet (5 mg total) by mouth every 6 (six) hours as needed for severe pain. 10 tablet 0  ? rosuvastatin (CRESTOR) 20 MG tablet Take 1 tablet (20 mg total) by mouth daily.    ? ?No current facility-administered  medications for this visit.  ? ? ?PHYSICAL EXAMINATION: ?ECOG PERFORMANCE STATUS: 1 - Symptomatic but completely ambulatory ? ?Vitals:  ? 03/15/22 0840  ?BP: 139/81  ?Pulse: 66  ?Resp: 18  ?Temp: (!) 97.2 ?F (36.2 ?C)  ?SpO2: 97%  ? ?Filed Weights  ? 03/15/22 0840  ?Weight: 182 lb 9.6 oz (82.8 kg)  ? ? ?  ? ?LABORATORY DATA:  ?I have reviewed the data as listed ? ?  Latest Ref Rng & Units 03/15/2022  ?  8:25 AM 02/10/2022  ?  8:47 AM 01/20/2022  ? 11:21 AM  ?CMP  ?Glucose 70 - 99 mg/dL 103   120   112    ?BUN 8 - 23 mg/dL _0 ?Creatinine 0.44 - 1.00 mg/dL 0.91   1.01   0.96    ?Sodium 135 - 145 mmol/L 139   140   142    ?Potassium 3.5 - 5.1 mmol/L 3.8   3.6   3.6    ?Chloride 98 - 111 mmol/L 105   106   107    ?CO2 22 - 32 mmol/L _1 ?Calcium 8.9 - 10.3 mg/dL 9.4   9.6   9.9    ?Total Protein 6.5 - 8.1 g/dL 7.1   7.4   7.1    ?Total Bilirubin 0.3 - 1.2 mg/dL 0.4   0.3   0.4    ?Alkaline Phos 38 - 126 U/L 67   96   99    ?AST 15 - 41 U/L _2 ?ALT 0 - 44 U/L _3 ? ? ?Lab Results  ?Component Value Date  ? WBC 4.5 03/15/2022  ? HGB 11.6 (L) 03/15/2022  ? HCT 34.2 (L) 03/15/2022  ? MCV 86.1 03/15/2022  ? PLT 259 03/15/2022  ? NEUTROABS 3.1 03/15/2022  ? ? ?ASSESSMENT & PLAN:  ?Malignant neoplasm of upper-inner quadrant of right breast in female, estrogen receptor negative ([place]) ?status post right breast upper inner quadrant biopsy 06/03/2021 for a clinical T2N0 3.3 cm invasive ductal carcinoma, grade 3, functionally triple negative, ER 5%, PR 0%, HER2 2+ by IHC FISH negative, Ki-67 95%  ?  ?Treatment plan:  ?1. Neoadjuvant chemotherapy consisting of carboplatin paclitaxel and pembrolizumab for 4 cycles starting 06/23/2021, to be followed by doxorubicin and cyclophosphamide and pembrolizumab for 4 cycles completed 12/09/2021, with pembrolizumab to be continued to complete a year.   ?2. right Lumpectomy: 0.5 cm grade 2 IDC 0/4 LN Neg, Margins Neg ?3.  Adjuvant radiation therapy   ?------------------------------------------------------------------------------------------------------------------------------ ?Treatment plan:   ?1. Adj XRT started 03/10/2022 ?2. [name] will continue every 3 weeks..  ?  ?RTC every 3 weeks for Keytruda and every 6 weeks for follow up with me ? ? ? ?No orders of the defined types were placed in this encounter. ? ?The patient has a good understanding of the overall plan. she agrees with it. she will call with any problems that may develop before the next visit here. ?Total time spent: 30 mins including face to  face time and time spent for planning, charting and co-ordination of care ? ? Harriette Ohara, MD ?03/15/22 ? ? ? I Gardiner Coins am scribing for Dr. Lindi Adie ? ?I have reviewed the above documentation for accuracy and completeness, and I agree with the above. ?  ?

## 2022-03-16 ENCOUNTER — Other Ambulatory Visit: Payer: Self-pay

## 2022-03-16 ENCOUNTER — Telehealth: Payer: Self-pay | Admitting: Hematology and Oncology

## 2022-03-16 ENCOUNTER — Ambulatory Visit
Admission: RE | Admit: 2022-03-16 | Discharge: 2022-03-16 | Disposition: A | Discharge status: Home / Self Care | Payer: BC Managed Care – PPO | Source: Ambulatory Visit | Attending: Radiation Oncology | Admitting: Radiation Oncology

## 2022-03-16 DIAGNOSIS — Z5112 Encounter for antineoplastic immunotherapy: Secondary | ICD-10-CM | POA: Diagnosis not present

## 2022-03-16 LAB — RAD ONC ARIA SESSION SUMMARY
Course Elapsed Days: 7
Plan Fractions Treated to Date: 6
Plan Prescribed Dose Per Fraction: 2.66 Gy
Plan Total Fractions Prescribed: 16
Plan Total Prescribed Dose: 42.56 Gy
Reference Point Dosage Given to Date: 15.96 Gy
Reference Point Session Dosage Given: 2.66 Gy
Session Number: 6

## 2022-03-16 NOTE — Telephone Encounter (Signed)
Scheduled appointment per 5/15 los. Patient is aware. Patient will be mailed an updated calendar. ?

## 2022-03-17 ENCOUNTER — Other Ambulatory Visit: Payer: Self-pay

## 2022-03-17 ENCOUNTER — Ambulatory Visit
Admission: RE | Admit: 2022-03-17 | Discharge: 2022-03-17 | Disposition: A | Discharge status: Home / Self Care | Payer: BC Managed Care – PPO | Source: Ambulatory Visit | Attending: Radiation Oncology | Admitting: Radiation Oncology

## 2022-03-17 ENCOUNTER — Telehealth: Payer: Self-pay

## 2022-03-17 DIAGNOSIS — Z5112 Encounter for antineoplastic immunotherapy: Secondary | ICD-10-CM | POA: Diagnosis not present

## 2022-03-17 LAB — RAD ONC ARIA SESSION SUMMARY
Course Elapsed Days: 8
Plan Fractions Treated to Date: 7
Plan Prescribed Dose Per Fraction: 2.66 Gy
Plan Total Fractions Prescribed: 16
Plan Total Prescribed Dose: 42.56 Gy
Reference Point Dosage Given to Date: 18.62 Gy
Reference Point Session Dosage Given: 2.66 Gy
Session Number: 7

## 2022-03-17 NOTE — Telephone Encounter (Addendum)
ACCRU-[place]-2102 - TREATMENT OF ESTABLISHED CHEMOTHERAPY-INDUCED NEUROPATHY WITH N-PALMITOYLETHANOLAMIDE, A CANNABIMIMETIC NUTRACEUTICAL: A RANDOMIZED DOUBLE-BLIND PHASE II PILOT TRIAL ? ?Spoke with Ms Echeverria about the above-named study. I have emailed the study with some eligibility questions and am waiting to hear back from them to see if she is eligible.  ? ?In the interim, I sent Ms Bari the name of the study drug and my contact information so she can become familiar with PEA if desired.  ? ?Confirmed she is not taking gabapentin or oxycodone. Confirmed no DM/DM neuropathy. CIPN was discussed in her 10/07/21 oncology visit, so she meets the time criteria. ? ?I will reach out to Ms Rabadan again once I hear from [facility]. ? ?Marjie Skiff Xavier Fournier, RN, BSN, CHPN ?She  Her  Hers ?Clinical Research Nurse ?[place] ?Direct Dial [phone]  Pager [phone] ?03/17/2022 10:33 AM ? ? ? ?ADDENDUM: rec'd OK from study to proceed with enrollment (checked d/t pt still getting pembrolizumab and still getting RT). Spoke with patient; we will meet tomorrow prior to her radiation to give her the consents for review. ? ?Marjie Skiff Adamari Frede, RN, BSN, CHPN ?She  Her  [facility]ers ?Clinical Research Nurse ?[place] ?Direct Dial [phone]  Pager [phone] ?03/17/2022 4:22 PM ?

## 2022-03-18 ENCOUNTER — Other Ambulatory Visit: Payer: Self-pay

## 2022-03-18 ENCOUNTER — Inpatient Hospital Stay: Payer: BC Managed Care – PPO

## 2022-03-18 ENCOUNTER — Ambulatory Visit
Admission: RE | Admit: 2022-03-18 | Discharge: 2022-03-18 | Disposition: A | Discharge status: Home / Self Care | Payer: BC Managed Care – PPO | Source: Ambulatory Visit | Attending: Radiation Oncology | Admitting: Radiation Oncology

## 2022-03-18 DIAGNOSIS — Z5112 Encounter for antineoplastic immunotherapy: Secondary | ICD-10-CM | POA: Diagnosis not present

## 2022-03-18 DIAGNOSIS — Z171 Estrogen receptor negative status [ER-]: Secondary | ICD-10-CM

## 2022-03-18 LAB — RAD ONC ARIA SESSION SUMMARY
Course Elapsed Days: 9
Plan Fractions Treated to Date: 8
Plan Prescribed Dose Per Fraction: 2.66 Gy
Plan Total Fractions Prescribed: 16
Plan Total Prescribed Dose: 42.56 Gy
Reference Point Dosage Given to Date: 21.28 Gy
Reference Point Session Dosage Given: 2.66 Gy
Session Number: 8

## 2022-03-18 NOTE — Research (Signed)
ACCRU-[place]-2102 - TREATMENT OF ESTABLISHED CHEMOTHERAPY-INDUCED NEUROPATHY WITH N-PALMITOYLETHANOLAMIDE, A CANNABIMIMETIC NUTRACEUTICAL: A RANDOMIZED DOUBLE-BLIND PHASE II PILOT TRIAL  Confirmed the following with the patient:   Neuropathy symptoms 5/10 in the past week. Patient is post-hysterectomy. She can swallow oral medications. She has not ever taken PEA. Not taking or planning to take: opioids, duloxetine, gabapentin, or pregabalin. Does not use or plan to use any cannabis/THC/CBD products. Can read, write and understand English. She is not receiving chemo for a second cancer or recurrence of primary ca. She does not have impaired memory, dementia, or memory loss. No dx of DM or other non-chemo induced neuropathy; no hx neuropathy prior to chemo; no HIV neuropathy.  Marjie Skiff Mao Lockner, RN, BSN, [facility] She  Her  Hers Clinical Research Nurse [facility] Direct Dial [phone]  Pager [phone] 03/18/2022 2:13 PM

## 2022-03-18 NOTE — Research (Signed)
Trial:  ACCRU-[id number] - TREATMENT OF ESTABLISHED CHEMOTHERAPY-INDUCED NEUROPATHY WITH N-PALMITOYLETHANOLAMIDE, A CANNABIMIMETIC NUTRACEUTICAL: A RANDOMIZED DOUBLE-BLIND PHASE II PILOT TRIAL Patient Rita Davis was identified by Dr Lindi Adie as a potential candidate for the above listed study.  This Clinical Research Nurse met with Rita Davis, [id number], on 03/18/22 in a manner and location that ensures patient privacy to discuss participation in the above listed research study.  Patient is Unaccompanied.  A copy of the informed consent document and separate HIPAA Authorization was provided to the patient.  Patient reads, speaks, and understands [place].   Patient was provided with the business card of this Nurse and encouraged to contact the research team with any questions.  Approximately 15 minutes were spent with the patient reviewing the informed consent documents.  Patient was provided the option of taking informed consent documents home to review and was encouraged to review at their convenience with their support network, including other care providers. Patient took the consent documents home to review. Plan made to meet tomorrow afternoon to sign consents. Marjie Skiff Maebelle Sulton, RN, BSN, [facility] She  Her  Hers Clinical Research Nurse [place] [phone]  Pager [phone] 03/18/2022 2:07 PM

## 2022-03-19 ENCOUNTER — Ambulatory Visit
Admission: RE | Admit: 2022-03-19 | Discharge: 2022-03-19 | Disposition: A | Discharge status: Home / Self Care | Payer: BC Managed Care – PPO | Source: Ambulatory Visit | Attending: Radiation Oncology | Admitting: Radiation Oncology

## 2022-03-19 ENCOUNTER — Other Ambulatory Visit: Payer: Self-pay

## 2022-03-19 ENCOUNTER — Inpatient Hospital Stay: Payer: BC Managed Care – PPO

## 2022-03-19 ENCOUNTER — Encounter: Payer: Self-pay | Admitting: Radiology

## 2022-03-19 DIAGNOSIS — Z171 Estrogen receptor negative status [ER-]: Secondary | ICD-10-CM

## 2022-03-19 DIAGNOSIS — Z5112 Encounter for antineoplastic immunotherapy: Secondary | ICD-10-CM | POA: Diagnosis not present

## 2022-03-19 LAB — RAD ONC ARIA SESSION SUMMARY
Course Elapsed Days: 10
Plan Fractions Treated to Date: 9
Plan Prescribed Dose Per Fraction: 2.66 Gy
Plan Total Fractions Prescribed: 16
Plan Total Prescribed Dose: 42.56 Gy
Reference Point Dosage Given to Date: 23.94 Gy
Reference Point Session Dosage Given: 2.66 Gy
Session Number: 9

## 2022-03-19 NOTE — Research (Signed)
ACCRU-[place]-2102 - TREATMENT OF ESTABLISHED CHEMOTHERAPY-INDUCED NEUROPATHY WITH N-PALMITOYLETHANOLAMIDE, A CANNABIMIMETIC NUTRACEUTICAL: A RANDOMIZED DOUBLE-BLIND PHASE II PILOT TRIAL    This Nurse has reviewed this patient's inclusion and exclusion criteria and confirmed Rita Davis is eligible for study participation.  Patient will continue with enrollment.  Menopausal status (women only): Rita Davis is  post-hysterectomy.  Eligibility confirmed by treating investigator, who also agrees that patient should proceed with enrollment.  Rita Davis Rita Glore, RN, BSN, [facility] She  Her  Hers Clinical Research Nurse [facility] Direct Dial [phone]  Pager [phone] 03/19/2022 2:10 PM

## 2022-03-19 NOTE — Research (Signed)
ACCRU-[place]-2102 - TREATMENT OF ESTABLISHED CHEMOTHERAPY-INDUCED NEUROPATHY WITH N-PALMITOYLETHANOLAMIDE, A CANNABIMIMETIC NUTRACEUTICAL: A RANDOMIZED DOUBLE-BLIND PHASE II PILOT TRIAL   03/19/2022  SECOND REVIEW:  This Coordinator has reviewed this patient's inclusion and exclusion criteria and confirmed Rita Davis is eligible for study participation.  Patient will continue with enrollment.  Menopausal status (women only): Rita Davis has had a hysterectomy.  Eligibility confirmed by treating investigator and research nurse, who also agrees that patient should proceed with enrollment.   Carol Ada, RT(R)(T) Clinical Research Coordinator

## 2022-03-19 NOTE — Research (Signed)
Trial Name:  ACCRU-[place]-2102 - TREATMENT OF ESTABLISHED CHEMOTHERAPY-INDUCED NEUROPATHY WITH N-PALMITOYLETHANOLAMIDE, A CANNABIMIMETIC NUTRACEUTICAL: A RANDOMIZED DOUBLE-BLIND PHASE II PILOT TRIAL  Patient Rita Davis was identified by Dr Lindi Adie as a potential candidate for the above listed study.  This Clinical Research Nurse met with Rita Davis, Rita Davis on 03/19/22 in a manner and location that ensures patient privacy to discuss participation in the above listed research study.  Patient is Unaccompanied.  Patient was previously provided with informed consent documents.  Patient has not yet read the informed consent documents and so documents were reviewed page by page today.  As outlined in the informed consent form, this Nurse and Rita Davis discussed the purpose of the research study, the investigational nature of the study, study procedures and requirements for study participation, potential risks and benefits of study participation, as well as alternatives to participation.  This study is blinded or double-blinded. The patient understands participation is voluntary and they may withdraw from study participation at any time.  Each study arm was reviewed, and randomization discussed.  Potential side effects were reviewed with patient as outlined in the consent form, and patient made aware there may be side effects not yet known. The chance of receiving placebo was discussed. Patient understands enrollment is pending full eligibility review.   Confidentiality and how the patient's information will be used as part of study participation were discussed.  Patient was informed there is not reimbursement provided for their time and effort spent on trial participation.  The patient is encouraged to discuss research study participation with their insurance provider to determine what costs they may incur as part of study participation, including research related injury.    All questions were  answered to patient's satisfaction.  The informed consent with embedded HIPAA language was reviewed page by page.  The patient's mental and emotional status is appropriate to provide informed consent, and the patient verbalizes an understanding of study participation.  Patient has agreed to participate in the above listed research study and has voluntarily signed the informed consent version date 02 February 2021 with embedded HIPAA language, version date 02 February 2021  on 03/19/22 at 1345 PM.  The patient was provided with a copy of the signed informed consent form with embedded HIPAA language for their reference.  No study specific procedures were obtained prior to the signing of the informed consent document.  Approximately 20 minutes were spent with the patient reviewing the informed consent documents.  Patient was not requested to complete a Release of Information form.  Rita Skiff Vallie Fayette, RN, BSN, [facility] She  Her  Hers Clinical Research Nurse [place] [phone]  Pager [phone] 03/19/2022 2:09 PM

## 2022-03-22 ENCOUNTER — Other Ambulatory Visit: Payer: Self-pay | Admitting: *Deleted

## 2022-03-22 ENCOUNTER — Other Ambulatory Visit: Payer: Self-pay

## 2022-03-22 ENCOUNTER — Inpatient Hospital Stay: Payer: BC Managed Care – PPO

## 2022-03-22 ENCOUNTER — Ambulatory Visit
Admission: RE | Admit: 2022-03-22 | Discharge: 2022-03-22 | Disposition: A | Discharge status: Home / Self Care | Payer: BC Managed Care – PPO | Source: Ambulatory Visit | Attending: Radiation Oncology | Admitting: Radiation Oncology

## 2022-03-22 ENCOUNTER — Other Ambulatory Visit (HOSPITAL_BASED_OUTPATIENT_CLINIC_OR_DEPARTMENT_OTHER): Payer: Self-pay | Admitting: Hematology and Oncology

## 2022-03-22 DIAGNOSIS — Z171 Estrogen receptor negative status [ER-]: Secondary | ICD-10-CM

## 2022-03-22 DIAGNOSIS — Z5112 Encounter for antineoplastic immunotherapy: Secondary | ICD-10-CM | POA: Diagnosis not present

## 2022-03-22 LAB — RAD ONC ARIA SESSION SUMMARY
Course Elapsed Days: 13
Plan Fractions Treated to Date: 10
Plan Prescribed Dose Per Fraction: 2.66 Gy
Plan Total Fractions Prescribed: 16
Plan Total Prescribed Dose: 42.56 Gy
Reference Point Dosage Given to Date: 26.6 Gy
Reference Point Session Dosage Given: 2.66 Gy
Session Number: 10

## 2022-03-22 MED ORDER — INV-PALMITOYLETHANOLAMIDE/PLACEBO 400 MG CAPS ACCRU-~~LOC~~-2102: ORAL_CAPSULE | ORAL | 0 refills | Status: DC

## 2022-03-22 NOTE — Research (Signed)
ACCRU-[place]-2102 - TREATMENT OF ESTABLISHED CHEMOTHERAPY-INDUCED NEUROPATHY WITH N-PALMITOYLETHANOLAMIDE, A CANNABIMIMETIC NUTRACEUTICAL: A RANDOMIZED DOUBLE-BLIND PHASE II PILOT TRIAL  Patient came in to pick up medication and to drop off initial/baseline questionnaires. Confirmed name and DOB on bottles and reviewed how and when to take medication (once a day, in the morning, with food). Explained that I (or another research team member) will reach out weekly on Mondays to see how her week was and to remind her to mail the weekly questionnaires. Eight weekly questionnaires were provided, already marked with date/week and provided eight SASE with my name on the address for returning the questionnaires.  Patient verbalized understanding and denies any questions. She has my number and email address in case of questions.   At end of visit, pt was checked in to radiation and walked on her own to the waiting room.  Marjie Skiff Lovelace Cerveny, RN, BSN, [facility] She  Her  Hers Clinical Research Nurse [facility] Direct Dial [phone]  Pager [phone] 03/22/2022 1:39 PM

## 2022-03-23 ENCOUNTER — Ambulatory Visit
Admission: RE | Admit: 2022-03-23 | Discharge: 2022-03-23 | Disposition: A | Discharge status: Home / Self Care | Payer: BC Managed Care – PPO | Source: Ambulatory Visit | Attending: Radiation Oncology | Admitting: Radiation Oncology

## 2022-03-23 ENCOUNTER — Other Ambulatory Visit: Payer: Self-pay

## 2022-03-23 DIAGNOSIS — Z5112 Encounter for antineoplastic immunotherapy: Secondary | ICD-10-CM | POA: Diagnosis not present

## 2022-03-23 LAB — RAD ONC ARIA SESSION SUMMARY
Course Elapsed Days: 14
Plan Fractions Treated to Date: 11
Plan Prescribed Dose Per Fraction: 2.66 Gy
Plan Total Fractions Prescribed: 16
Plan Total Prescribed Dose: 42.56 Gy
Reference Point Dosage Given to Date: 29.26 Gy
Reference Point Session Dosage Given: 2.66 Gy
Session Number: 11

## 2022-03-24 ENCOUNTER — Other Ambulatory Visit: Payer: BC Managed Care – PPO

## 2022-03-24 ENCOUNTER — Ambulatory Visit: Payer: BC Managed Care – PPO | Admitting: Hematology and Oncology

## 2022-03-24 ENCOUNTER — Ambulatory Visit: Payer: BC Managed Care – PPO

## 2022-03-24 ENCOUNTER — Ambulatory Visit
Admission: RE | Admit: 2022-03-24 | Discharge: 2022-03-24 | Disposition: A | Discharge status: Home / Self Care | Payer: BC Managed Care – PPO | Source: Ambulatory Visit | Attending: Radiation Oncology | Admitting: Radiation Oncology

## 2022-03-24 ENCOUNTER — Other Ambulatory Visit: Payer: Self-pay

## 2022-03-24 DIAGNOSIS — Z5112 Encounter for antineoplastic immunotherapy: Secondary | ICD-10-CM | POA: Diagnosis not present

## 2022-03-24 LAB — RAD ONC ARIA SESSION SUMMARY
Course Elapsed Days: 15
Plan Fractions Treated to Date: 12
Plan Prescribed Dose Per Fraction: 2.66 Gy
Plan Total Fractions Prescribed: 16
Plan Total Prescribed Dose: 42.56 Gy
Reference Point Dosage Given to Date: 31.92 Gy
Reference Point Session Dosage Given: 2.66 Gy
Session Number: 12

## 2022-03-25 ENCOUNTER — Ambulatory Visit
Admission: RE | Admit: 2022-03-25 | Discharge: 2022-03-25 | Disposition: A | Discharge status: Home / Self Care | Payer: BC Managed Care – PPO | Source: Ambulatory Visit | Attending: Radiation Oncology | Admitting: Radiation Oncology

## 2022-03-25 ENCOUNTER — Other Ambulatory Visit: Payer: Self-pay

## 2022-03-25 DIAGNOSIS — Z5112 Encounter for antineoplastic immunotherapy: Secondary | ICD-10-CM | POA: Diagnosis not present

## 2022-03-25 LAB — RAD ONC ARIA SESSION SUMMARY
Course Elapsed Days: 16
Plan Fractions Treated to Date: 13
Plan Prescribed Dose Per Fraction: 2.66 Gy
Plan Total Fractions Prescribed: 16
Plan Total Prescribed Dose: 42.56 Gy
Reference Point Dosage Given to Date: 34.58 Gy
Reference Point Session Dosage Given: 2.66 Gy
Session Number: 13

## 2022-03-26 ENCOUNTER — Other Ambulatory Visit: Payer: Self-pay

## 2022-03-26 ENCOUNTER — Ambulatory Visit
Admission: RE | Admit: 2022-03-26 | Discharge: 2022-03-26 | Disposition: A | Discharge status: Home / Self Care | Payer: BC Managed Care – PPO | Source: Ambulatory Visit | Attending: Radiation Oncology | Admitting: Radiation Oncology

## 2022-03-26 ENCOUNTER — Ambulatory Visit: Payer: BC Managed Care – PPO | Admitting: Radiation Oncology

## 2022-03-26 DIAGNOSIS — Z5112 Encounter for antineoplastic immunotherapy: Secondary | ICD-10-CM | POA: Diagnosis not present

## 2022-03-26 LAB — RAD ONC ARIA SESSION SUMMARY
Course Elapsed Days: 17
Plan Fractions Treated to Date: 14
Plan Prescribed Dose Per Fraction: 2.66 Gy
Plan Total Fractions Prescribed: 16
Plan Total Prescribed Dose: 42.56 Gy
Reference Point Dosage Given to Date: 37.24 Gy
Reference Point Session Dosage Given: 2.66 Gy
Session Number: 14

## 2022-03-30 ENCOUNTER — Other Ambulatory Visit: Payer: Self-pay

## 2022-03-30 ENCOUNTER — Ambulatory Visit
Admission: RE | Admit: 2022-03-30 | Discharge: 2022-03-30 | Disposition: A | Discharge status: Home / Self Care | Payer: BC Managed Care – PPO | Source: Ambulatory Visit | Attending: Radiation Oncology | Admitting: Radiation Oncology

## 2022-03-30 DIAGNOSIS — C50211 Malignant neoplasm of upper-inner quadrant of right female breast: Secondary | ICD-10-CM

## 2022-03-30 DIAGNOSIS — Z5112 Encounter for antineoplastic immunotherapy: Secondary | ICD-10-CM | POA: Diagnosis not present

## 2022-03-30 LAB — RAD ONC ARIA SESSION SUMMARY
Course Elapsed Days: 21
Plan Fractions Treated to Date: 15
Plan Prescribed Dose Per Fraction: 2.66 Gy
Plan Total Fractions Prescribed: 16
Plan Total Prescribed Dose: 42.56 Gy
Reference Point Dosage Given to Date: 39.9 Gy
Reference Point Session Dosage Given: 2.66 Gy
Session Number: 15

## 2022-03-30 NOTE — Research (Signed)
ACCRU-[place]-2102 - TREATMENT OF ESTABLISHED CHEMOTHERAPY-INDUCED NEUROPATHY WITH N-PALMITOYLETHANOLAMIDE, A CANNABIMIMETIC NUTRACEUTICAL: A RANDOMIZED DOUBLE-BLIND PHASE II PILOT TRIAL   WEEK 1 VISIT: Patient was seen in XRT today to complete study week 1 phone call. Identity was confirmed using two patient identifiers.    QUESTIONNAIRE: Patient has completed their weekly questionnaire and has returned the completed form using the provided envelope.   INVESTIGATIONAL PRODUCT: Patient reports taking 7  capsules since 03/22/22. Patient is always compliant with study medication. Patient is re-educated on the importance of taking study medication as directed, once in the morning with food: patient does verbalize understanding.   ADVERSE EFFECTS: Patient Denies adverse effects   FOLLOW-UP: Patient Denies concerns at this time. Patients agrees to next follow-up phone call on 04/05/22.  The patient was thanked for their time and continued voluntary participation in this study. Patient has been provided direct contact information and is encouraged to contact this Nurse for any needs or questions.  Marjie Skiff Dartanyon Frankowski, RN, BSN, [facility] She  Her  Hers Clinical Research Nurse [facility] Direct Dial [phone]  Pager [phone] 03/30/2022 4:53 PM

## 2022-03-31 ENCOUNTER — Other Ambulatory Visit: Payer: Self-pay

## 2022-03-31 ENCOUNTER — Ambulatory Visit
Admission: RE | Admit: 2022-03-31 | Discharge: 2022-03-31 | Disposition: A | Discharge status: Home / Self Care | Payer: BC Managed Care – PPO | Source: Ambulatory Visit | Attending: Radiation Oncology | Admitting: Radiation Oncology

## 2022-03-31 DIAGNOSIS — Z5112 Encounter for antineoplastic immunotherapy: Secondary | ICD-10-CM | POA: Diagnosis not present

## 2022-03-31 LAB — RAD ONC ARIA SESSION SUMMARY
Course Elapsed Days: 22
Plan Fractions Treated to Date: 16
Plan Prescribed Dose Per Fraction: 2.66 Gy
Plan Total Fractions Prescribed: 16
Plan Total Prescribed Dose: 42.56 Gy
Reference Point Dosage Given to Date: 42.56 Gy
Reference Point Session Dosage Given: 2.66 Gy
Session Number: 16

## 2022-04-01 ENCOUNTER — Other Ambulatory Visit: Payer: Self-pay

## 2022-04-01 ENCOUNTER — Ambulatory Visit
Admission: RE | Admit: 2022-04-01 | Discharge: 2022-04-01 | Disposition: A | Discharge status: Home / Self Care | Payer: BC Managed Care – PPO | Source: Ambulatory Visit | Attending: Radiation Oncology | Admitting: Radiation Oncology

## 2022-04-01 DIAGNOSIS — Z510 Encounter for antineoplastic radiation therapy: Secondary | ICD-10-CM | POA: Insufficient documentation

## 2022-04-01 DIAGNOSIS — Z5112 Encounter for antineoplastic immunotherapy: Secondary | ICD-10-CM | POA: Insufficient documentation

## 2022-04-01 DIAGNOSIS — Z171 Estrogen receptor negative status [ER-]: Secondary | ICD-10-CM | POA: Insufficient documentation

## 2022-04-01 DIAGNOSIS — C50211 Malignant neoplasm of upper-inner quadrant of right female breast: Secondary | ICD-10-CM | POA: Diagnosis present

## 2022-04-01 DIAGNOSIS — Z79899 Other long term (current) drug therapy: Secondary | ICD-10-CM | POA: Insufficient documentation

## 2022-04-01 LAB — RAD ONC ARIA SESSION SUMMARY
Course Elapsed Days: 23
Plan Fractions Treated to Date: 1
Plan Prescribed Dose Per Fraction: 2 Gy
Plan Total Fractions Prescribed: 4
Plan Total Prescribed Dose: 8 Gy
Reference Point Dosage Given to Date: 44.56 Gy
Reference Point Session Dosage Given: 2 Gy
Session Number: 17

## 2022-04-02 ENCOUNTER — Other Ambulatory Visit: Payer: Self-pay

## 2022-04-02 ENCOUNTER — Ambulatory Visit
Admission: RE | Admit: 2022-04-02 | Discharge: 2022-04-02 | Disposition: A | Discharge status: Home / Self Care | Payer: BC Managed Care – PPO | Source: Ambulatory Visit | Attending: Radiation Oncology | Admitting: Radiation Oncology

## 2022-04-02 DIAGNOSIS — C50211 Malignant neoplasm of upper-inner quadrant of right female breast: Secondary | ICD-10-CM | POA: Diagnosis not present

## 2022-04-02 DIAGNOSIS — Z5112 Encounter for antineoplastic immunotherapy: Secondary | ICD-10-CM | POA: Diagnosis not present

## 2022-04-02 LAB — RAD ONC ARIA SESSION SUMMARY
Course Elapsed Days: 24
Plan Fractions Treated to Date: 2
Plan Prescribed Dose Per Fraction: 2 Gy
Plan Total Fractions Prescribed: 4
Plan Total Prescribed Dose: 8 Gy
Reference Point Dosage Given to Date: 46.56 Gy
Reference Point Session Dosage Given: 2 Gy
Session Number: 18

## 2022-04-05 ENCOUNTER — Encounter: Payer: Self-pay | Admitting: *Deleted

## 2022-04-05 ENCOUNTER — Inpatient Hospital Stay: Payer: BC Managed Care – PPO

## 2022-04-05 ENCOUNTER — Other Ambulatory Visit: Payer: Self-pay

## 2022-04-05 ENCOUNTER — Ambulatory Visit: Payer: BC Managed Care – PPO | Attending: Hematology and Oncology

## 2022-04-05 ENCOUNTER — Ambulatory Visit
Admission: RE | Admit: 2022-04-05 | Discharge: 2022-04-05 | Disposition: A | Discharge status: Home / Self Care | Payer: BC Managed Care – PPO | Source: Ambulatory Visit | Attending: Radiation Oncology | Admitting: Radiation Oncology

## 2022-04-05 ENCOUNTER — Inpatient Hospital Stay: Payer: BC Managed Care – PPO | Attending: Genetic Counselor

## 2022-04-05 VITALS — BP 125/90 | HR 74 | Temp 98.3°F | Resp 17 | Wt 182.5 lb

## 2022-04-05 VITALS — Wt 180.0 lb

## 2022-04-05 DIAGNOSIS — Z79899 Other long term (current) drug therapy: Secondary | ICD-10-CM | POA: Insufficient documentation

## 2022-04-05 DIAGNOSIS — C50211 Malignant neoplasm of upper-inner quadrant of right female breast: Secondary | ICD-10-CM

## 2022-04-05 DIAGNOSIS — Z8249 Family history of ischemic heart disease and other diseases of the circulatory system: Secondary | ICD-10-CM | POA: Insufficient documentation

## 2022-04-05 DIAGNOSIS — Z483 Aftercare following surgery for neoplasm: Secondary | ICD-10-CM | POA: Insufficient documentation

## 2022-04-05 DIAGNOSIS — Z8042 Family history of malignant neoplasm of prostate: Secondary | ICD-10-CM | POA: Insufficient documentation

## 2022-04-05 DIAGNOSIS — Z171 Estrogen receptor negative status [ER-]: Secondary | ICD-10-CM | POA: Insufficient documentation

## 2022-04-05 DIAGNOSIS — Z803 Family history of malignant neoplasm of breast: Secondary | ICD-10-CM | POA: Insufficient documentation

## 2022-04-05 DIAGNOSIS — Z800 Family history of malignant neoplasm of digestive organs: Secondary | ICD-10-CM | POA: Insufficient documentation

## 2022-04-05 DIAGNOSIS — Z5112 Encounter for antineoplastic immunotherapy: Secondary | ICD-10-CM | POA: Diagnosis not present

## 2022-04-05 DIAGNOSIS — Z95828 Presence of other vascular implants and grafts: Secondary | ICD-10-CM

## 2022-04-05 LAB — CMP (CANCER CENTER ONLY)
ALT: 20 U/L (ref 0–44)
AST: 23 U/L (ref 15–41)
Albumin: 4.1 g/dL (ref 3.5–5.0)
Alkaline Phosphatase: 81 U/L (ref 38–126)
Anion gap: 5 (ref 5–15)
BUN: 16 mg/dL (ref 8–23)
CO2: 28 mmol/L (ref 22–32)
Calcium: 9.7 mg/dL (ref 8.9–10.3)
Chloride: 107 mmol/L (ref 98–111)
Creatinine: 0.96 mg/dL (ref 0.44–1.00)
GFR, Estimated: >60 mL/min (ref >60)
Glucose, Bld: 112 mg/dL — ABNORMAL HIGH (ref 70–99)
Potassium: 4 mmol/L (ref 3.5–5.1)
Sodium: 140 mmol/L (ref 135–145)
Total Bilirubin: 0.3 mg/dL (ref 0.3–1.2)
Total Protein: 6.9 g/dL (ref 6.5–8.1)

## 2022-04-05 LAB — CBC WITH DIFFERENTIAL (CANCER CENTER ONLY)
Abs Immature Granulocytes: 0.01 10*3/uL (ref 0.00–0.07)
Basophils Absolute: 0 10*3/uL (ref 0.0–0.1)
Basophils Relative: 1 %
Eosinophils Absolute: 0.2 10*3/uL (ref 0.0–0.5)
Eosinophils Relative: 4 %
HCT: 34.1 % — ABNORMAL LOW (ref 36.0–46.0)
Hemoglobin: 11.6 g/dL — ABNORMAL LOW (ref 12.0–15.0)
Immature Granulocytes: 0 %
Lymphocytes Relative: 15 %
Lymphs Abs: 0.7 10*3/uL (ref 0.7–4.0)
MCH: 29.4 pg (ref 26.0–34.0)
MCHC: 34 g/dL (ref 30.0–36.0)
MCV: 86.3 fL (ref 80.0–100.0)
Monocytes Absolute: 0.4 10*3/uL (ref 0.1–1.0)
Monocytes Relative: 8 %
Neutro Abs: 3.6 10*3/uL (ref 1.7–7.7)
Neutrophils Relative %: 72 %
Platelet Count: 188 10*3/uL (ref 150–400)
RBC: 3.95 MIL/uL (ref 3.87–5.11)
RDW: 15.4 % (ref 11.5–15.5)
WBC Count: 5 10*3/uL (ref 4.0–10.5)
nRBC: 0 % (ref 0.0–0.2)

## 2022-04-05 LAB — RAD ONC ARIA SESSION SUMMARY
Course Elapsed Days: 27
Plan Fractions Treated to Date: 3
Plan Prescribed Dose Per Fraction: 2 Gy
Plan Total Fractions Prescribed: 4
Plan Total Prescribed Dose: 8 Gy
Reference Point Dosage Given to Date: 48.56 Gy
Reference Point Session Dosage Given: 2 Gy
Session Number: 19

## 2022-04-05 MED ORDER — RADIAPLEXRX EX GEL: Freq: Once | CUTANEOUS | Status: AC

## 2022-04-05 MED ORDER — SODIUM CHLORIDE 0.9 % IV SOLN
200.0000 mg | Freq: Once | INTRAVENOUS | Status: AC
  Administered 2022-04-05: 200 mg via INTRAVENOUS
  Filled 2022-04-05: qty 200

## 2022-04-05 MED ORDER — SODIUM CHLORIDE 0.9 % IV SOLN: Freq: Once | INTRAVENOUS | Status: AC

## 2022-04-05 MED ORDER — HEPARIN SOD (PORK) LOCK FLUSH 100 UNIT/ML IV SOLN
500.0000 [IU] | Freq: Once | INTRAVENOUS | Status: AC | PRN
  Administered 2022-04-05: 500 [IU]

## 2022-04-05 MED ORDER — SODIUM CHLORIDE 0.9% FLUSH
10.0000 mL | INTRAVENOUS | Status: DC | PRN
  Administered 2022-04-05: 10 mL

## 2022-04-05 MED ORDER — SODIUM CHLORIDE 0.9% FLUSH
10.0000 mL | INTRAVENOUS | Status: DC | PRN
  Administered 2022-04-05: 10 mL via INTRAVENOUS

## 2022-04-05 NOTE — Therapy (Signed)
OUTPATIENT PHYSICAL THERAPY SOZO SCREENING NOTE   Patient Name: Rita Davis MRN: [id number] DOB:1961/01/26, 61 y.o., female Today's Date: 04/05/2022  PCP: Jenel Lucks, PA-C REFERRING PROVIDER: Nicholas Lose, MD   PT End of Session - 04/05/22 1018     Visit Number 2   # unchanged due to screen only   PT Start Time 1015    PT Stop Time 1021    PT Time Calculation (min) 6 min    Activity Tolerance Patient tolerated treatment well    Behavior During Therapy [facility] for tasks assessed/performed             Past Medical History:  Diagnosis Date   Anemia    had a fibroid tumor, was anemic at that time   Anxiety    Cancer [facility])    right breast IDC   Family history of breast cancer    Family history of colon cancer    Family history of prostate cancer    Hyperlipidemia    Hypertension    Pneumonia 11/2021   hospitalized   Pre-diabetes    Past Surgical History:  Procedure Laterality Date   ABDOMINAL HYSTERECTOMY     still has ovaries   BREAST BIOPSY Right 05/23/2012   BREAST CYST EXCISION Right    BREAST LUMPECTOMY WITH RADIOACTIVE SEED AND SENTINEL LYMPH NODE BIOPSY Right 12/21/2021   Procedure: RIGHT BREAST LUMPECTOMY WITH RADIOACTIVE SEED AND SENTINEL LYMPH NODE BIOPSY;  Surgeon: Stark Klein, MD;  Location: [place];  Service: General;  Laterality: Right;   cyst removed     from lower back   [place]     ORIF ANKLE FRACTURE Left 11/13/2020   Procedure: OPEN TREATMENT OF LEFT TRIMALLEOLAR ANKLE FRACTURE WITH POSTERIOR FIXATION, SYNDESMOSIS;  Surgeon: Erle Crocker, MD;  Location: [place];  Service: Orthopedics;  Laterality: Left;  LENGTH OF SURGERY: 1.5 HOURS   PORTACATH PLACEMENT Left 06/18/2021   Procedure: PORT PLACEMENT;  Surgeon: Stark Klein, MD;  Location: [place];  Service: General;  Laterality: Left;   Patient Active Problem List   Diagnosis Date Noted   Encounter for  antineoplastic immunotherapy 02/10/2022   Bilateral pneumonia 11/05/2021   Pneumonia of both lower lobes due to infectious organism 11/04/2021   Genetic testing 07/10/2021   Port-A-Cath in place 06/24/2021   Family history of breast cancer 06/09/2021   Family history of prostate cancer 06/09/2021   Family history of colon cancer 06/09/2021   Malignant neoplasm of upper-inner quadrant of right breast in female, estrogen receptor negative ([place]) 06/09/2021   Pneumonia 03/21/2018   Fibroid tumor 03/10/2018   Adjustment insomnia 11/26/2015   Eczema 11/26/2015   Other specified abnormal findings of blood chemistry 11/26/2015   Healthcare maintenance 04/10/2014   Anxiety state 02/28/2014   OVERWEIGHT 03/27/2009   Overweight 03/27/2009   HYPERLIPIDEMIA 01/24/2009   ANEMIA 11/20/2007   HYPERTENSION 11/20/2007    REFERRING DIAG: right breast cancer at risk for lymphedema  THERAPY DIAG:  Aftercare following surgery for neoplasm  PERTINENT HISTORY: Initial Diagnosis 06/03/21 of Rt breast cancer. Neo-adjuvant chemotherapy started 06/23/21 of Carboplatin/Paclitaxel/Keytruda followed by Cyclophosphamide and Keytruda. Pt Rt lumpectomy and SLNB 0/4 LN positive on 12/21/21 with Dr. Barry Dienes, and then will have radiation.   PRECAUTIONS: right UE Lymphedema risk, None  SUBJECTIVE: Pt returns for her 3 month L-Dex screen.   PAIN:  Are you having pain? No  SOZO SCREENING:  Patient was assessed today using the SOZO  machine to determine the lymphedema index score. This was compared to her baseline score. It was determined that she is NOT within the recommended range when compared to her baseline and so she was fitted for a compression garment while in the clinic today but does not fit in standard OTS size so will order her one through [facility].. It is recommended she return in 1 month to be reassessed. If she continues to measure outside the recommended range, physical therapy treatment will be recommended at  that time and a referral requested.    Otelia Limes, PTA 04/05/2022, 10:22 AM

## 2022-04-05 NOTE — Patient Instructions (Signed)
[facility] MEDICAL ONCOLOGY  Discharge Instructions: Thank you for choosing [facility] to provide your oncology and hematology care.   If you have a lab appointment with the Cancer Center, please go directly to the Cancer Center and check in at the registration area.   Wear comfortable clothing and clothing appropriate for easy access to any Portacath or PICC line.   We strive to give you quality time with your provider. You may need to reschedule your appointment if you arrive late (15 or more minutes).  Arriving late affects you and other patients whose appointments are after yours.  Also, if you miss three or more appointments without notifying the office, you may be dismissed from the clinic at the provider's discretion.      For prescription refill requests, have your pharmacy contact our office and allow 72 hours for refills to be completed.    Today you received the following chemotherapy and/or immunotherapy agents: keytruda      To help prevent nausea and vomiting after your treatment, we encourage you to take your nausea medication as directed.  BELOW ARE SYMPTOMS THAT SHOULD BE REPORTED IMMEDIATELY: *FEVER GREATER THAN 100.4 F (38 C) OR HIGHER *CHILLS OR SWEATING *NAUSEA AND VOMITING THAT IS NOT CONTROLLED WITH YOUR NAUSEA MEDICATION *UNUSUAL SHORTNESS OF BREATH *UNUSUAL BRUISING OR BLEEDING *URINARY PROBLEMS (pain or burning when urinating, or frequent urination) *BOWEL PROBLEMS (unusual diarrhea, constipation, pain near the anus) TENDERNESS IN MOUTH AND THROAT WITH OR WITHOUT PRESENCE OF ULCERS (sore throat, sores in mouth, or a toothache) UNUSUAL RASH, SWELLING OR PAIN  UNUSUAL VAGINAL DISCHARGE OR ITCHING   Items with * indicate a potential emergency and should be followed up as soon as possible or go to the Emergency Department if any problems should occur.  Please show the CHEMOTHERAPY ALERT CARD or IMMUNOTHERAPY ALERT CARD at check-in to  the Emergency Department and triage nurse.  Should you have questions after your visit or need to cancel or reschedule your appointment, please contact [facility] MEDICAL [facility]COLOG[facility]  Dept: [phone]  and follow the prompts.  Office hours are 8:00 a.m. to 4:30 p.m. Monday - Friday. Please note that voicemails left after 4:00 p.m. may not be returned until the following business day.  We are closed weekends and major holidays. You have access to a nurse at all times for urgent questions. Please call the main number to the clinic Dept: [phone] and follow the prompts.   For any non-urgent questions, you may also contact your provider using MyChart. We now offer e-Visits for anyone 18 and older to request care online for non-urgent symptoms. For details visit [email or web address].   Also download the MyChart app! Go to the app store, search "MyChart", open the app, select [facility], and log in with your MyChart username and password.  Due to Covid, a mask is required upon entering the hospital/clinic. If you do not have a mask, one will be given to you upon arrival. For doctor visits, patients may have 1 support person aged 18 or older with them. For treatment visits, patients cannot have anyone with them due to current Covid guidelines and our immunocompromised population.   

## 2022-04-06 ENCOUNTER — Ambulatory Visit
Admission: RE | Admit: 2022-04-06 | Discharge: 2022-04-06 | Disposition: A | Discharge status: Home / Self Care | Payer: BC Managed Care – PPO | Source: Ambulatory Visit | Attending: Radiation Oncology | Admitting: Radiation Oncology

## 2022-04-06 ENCOUNTER — Encounter: Payer: Self-pay | Admitting: Radiation Oncology

## 2022-04-06 ENCOUNTER — Other Ambulatory Visit: Payer: Self-pay

## 2022-04-06 ENCOUNTER — Encounter: Payer: Self-pay | Admitting: Oncology

## 2022-04-06 ENCOUNTER — Encounter: Payer: Self-pay | Admitting: Hematology and Oncology

## 2022-04-06 DIAGNOSIS — Z5112 Encounter for antineoplastic immunotherapy: Secondary | ICD-10-CM | POA: Diagnosis not present

## 2022-04-06 DIAGNOSIS — C50211 Malignant neoplasm of upper-inner quadrant of right female breast: Secondary | ICD-10-CM | POA: Diagnosis not present

## 2022-04-06 LAB — RAD ONC ARIA SESSION SUMMARY
Course Elapsed Days: 28
Plan Fractions Treated to Date: 4
Plan Prescribed Dose Per Fraction: 2 Gy
Plan Total Fractions Prescribed: 4
Plan Total Prescribed Dose: 8 Gy
Reference Point Dosage Given to Date: 50.56 Gy
Reference Point Session Dosage Given: 2 Gy
Session Number: 20

## 2022-04-06 LAB — TSH: TSH: 1.751 u[IU]/mL (ref 0.350–4.500)

## 2022-04-06 NOTE — Research (Signed)
ACCRU-[place]-2102 - TREATMENT OF ESTABLISHED CHEMOTHERAPY-INDUCED NEUROPATHY WITH N-PALMITOYLETHANOLAMIDE, A CANNABIMIMETIC NUTRACEUTICAL: A RANDOMIZED DOUBLE-BLIND PHASE II PILOT TRIAL   WEEK 2 VISIT: Patient was seen in infusion today to complete study week 2 phone call. Identity was confirmed using two patient identifiers.    QUESTIONNAIRE: Patient has completed their weekly questionnaire and has returned the completed form using the provided envelope.   INVESTIGATIONAL PRODUCT: Patient reports taking 7  capsules since 03/30/22. Patient is always compliant with study medication. Patient is re-educated on the importance of taking study medication as directed once in the morning with food: patient does verbalize understanding.   ADVERSE EFFECTS: Patient Denies adverse effects, including diarrhea, nausea, and vomiting.  THC/CBD USE: Patient denies use of any CBD/THC products.  FOLLOW-UP: Patient Denies concerns at this time. Patient agrees to next follow-up phone call on April 12, 2022. The patient was thanked for their time and continued voluntary participation in this study. Patient has been provided direct contact information and is encouraged to contact this Nurse for any needs or questions.  Marjie Skiff Alnisa Hasley, RN, BSN, [facility] She  Her  Hers Clinical Research Nurse [place] [phone]  Pager [phone] 04/06/2022 11:49 AM

## 2022-04-12 ENCOUNTER — Encounter: Payer: Self-pay | Admitting: Oncology

## 2022-04-12 ENCOUNTER — Encounter: Payer: Self-pay | Admitting: Hematology and Oncology

## 2022-04-12 DIAGNOSIS — Z171 Estrogen receptor negative status [ER-]: Secondary | ICD-10-CM

## 2022-04-12 NOTE — Research (Signed)
[id number] - TREATMENT OF REFRACTORY NAUSEA  Called patient for Week 3. She will mail questionnaires. States her "neuropathy is clearing up" and that she was able to drive to [place] this weekend. She has not missed any doses. She denies any new AEs, including nausea/vomiting/diarrhea.  Marjie Skiff Kymoni Lesperance, RN, BSN, [facility] She  Her  Hers Clinical Research Nurse [facility] Direct Dial [phone]  Pager [phone] 04/12/2022 11:24 AM

## 2022-04-12 NOTE — Progress Notes (Signed)
                                                                                                                                                             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 She developed fatigue and anticipated skin changes in the treatment field.   Plan: The patient will receive a call in about one month from the radiation oncology department. She will continue follow up with Dr. Lindi Adie as well.   ________________________________________________    Carola Rhine, [facility]

## 2022-04-14 ENCOUNTER — Inpatient Hospital Stay: Payer: BC Managed Care – PPO

## 2022-04-19 ENCOUNTER — Encounter: Payer: Self-pay | Admitting: *Deleted

## 2022-04-19 DIAGNOSIS — C50211 Malignant neoplasm of upper-inner quadrant of right female breast: Secondary | ICD-10-CM

## 2022-04-19 NOTE — Research (Signed)
ACCRU-[place]-2102 - TREATMENT OF ESTABLISHED CHEMOTHERAPY-INDUCED NEUROPATHY WITH N-PALMITOYLETHANOLAMIDE, A CANNABIMIMETIC NUTRACEUTICAL: A RANDOMIZED DOUBLE-BLIND PHASE II PILOT TRIAL  WEEK 4:  Patient was contacted via phone today to complete study week 4 phone call. Identity was confirmed using two patient identifiers.    INVESTIGATIONAL PRODUCT: Patient reports taking 7 capsules since 04/12/22. Patient is always compliant with study medication.  She denies any new medications since starting on study except for a one a day multivitamin.    THC/CBD USE: Patient denies use of any CBD/THC products.  ADVERSE EFFECTS: Patient Denies adverse effects, including diarrhea, nausea, and vomiting.  QUESTIONNAIRE: Patient states she is going to complete her weekly questionnaire today and drop it off at the front desk of the [facility] around lunchtime since it is a holiday and the post office is closed.      FOLLOW-UP: Patient Denies concerns at this time. Patient agrees to next follow-up phone call on April 26, 2022. The patient was thanked for their time and continued voluntary participation in this study. Patient has been provided direct contact information and is encouraged to contact  Nurse, Melanine Henslee, for any needs or questions.  Foye Spurling, BSN, RN, CCRP Clinical Research Nurse [place] 04/19/2022 9:36 AM

## 2022-04-26 ENCOUNTER — Encounter: Payer: Self-pay | Admitting: Adult Health

## 2022-04-26 ENCOUNTER — Inpatient Hospital Stay: Payer: BC Managed Care – PPO

## 2022-04-26 ENCOUNTER — Inpatient Hospital Stay (HOSPITAL_BASED_OUTPATIENT_CLINIC_OR_DEPARTMENT_OTHER): Payer: BC Managed Care – PPO | Admitting: Adult Health

## 2022-04-26 VITALS — BP 141/94 | HR 118 | Temp 97.5°F | Resp 18 | Ht 68.0 in | Wt 180.9 lb

## 2022-04-26 VITALS — HR 98

## 2022-04-26 DIAGNOSIS — Z8042 Family history of malignant neoplasm of prostate: Secondary | ICD-10-CM | POA: Diagnosis not present

## 2022-04-26 DIAGNOSIS — Z5112 Encounter for antineoplastic immunotherapy: Secondary | ICD-10-CM | POA: Insufficient documentation

## 2022-04-26 DIAGNOSIS — Z8249 Family history of ischemic heart disease and other diseases of the circulatory system: Secondary | ICD-10-CM | POA: Diagnosis not present

## 2022-04-26 DIAGNOSIS — Z800 Family history of malignant neoplasm of digestive organs: Secondary | ICD-10-CM | POA: Diagnosis not present

## 2022-04-26 DIAGNOSIS — C50211 Malignant neoplasm of upper-inner quadrant of right female breast: Secondary | ICD-10-CM

## 2022-04-26 DIAGNOSIS — Z171 Estrogen receptor negative status [ER-]: Secondary | ICD-10-CM | POA: Diagnosis not present

## 2022-04-26 DIAGNOSIS — Z79899 Other long term (current) drug therapy: Secondary | ICD-10-CM | POA: Diagnosis not present

## 2022-04-26 DIAGNOSIS — Z95828 Presence of other vascular implants and grafts: Secondary | ICD-10-CM

## 2022-04-26 DIAGNOSIS — Z803 Family history of malignant neoplasm of breast: Secondary | ICD-10-CM | POA: Insufficient documentation

## 2022-04-26 LAB — CBC WITH DIFFERENTIAL (CANCER CENTER ONLY)
Abs Immature Granulocytes: 0.03 10*3/uL (ref 0.00–0.07)
Basophils Absolute: 0 10*3/uL (ref 0.0–0.1)
Basophils Relative: 0 %
Eosinophils Absolute: 0.3 10*3/uL (ref 0.0–0.5)
Eosinophils Relative: 4 %
HCT: 35.4 % — ABNORMAL LOW (ref 36.0–46.0)
Hemoglobin: 12.1 g/dL (ref 12.0–15.0)
Immature Granulocytes: 0 %
Lymphocytes Relative: 13 %
Lymphs Abs: 0.9 10*3/uL (ref 0.7–4.0)
MCH: 29.4 pg (ref 26.0–34.0)
MCHC: 34.2 g/dL (ref 30.0–36.0)
MCV: 85.9 fL (ref 80.0–100.0)
Monocytes Absolute: 0.6 10*3/uL (ref 0.1–1.0)
Monocytes Relative: 8 %
Neutro Abs: 5.5 10*3/uL (ref 1.7–7.7)
Neutrophils Relative %: 75 %
Platelet Count: 186 10*3/uL (ref 150–400)
RBC: 4.12 MIL/uL (ref 3.87–5.11)
RDW: 15.9 % — ABNORMAL HIGH (ref 11.5–15.5)
WBC Count: 7.3 10*3/uL (ref 4.0–10.5)
nRBC: 0 % (ref 0.0–0.2)

## 2022-04-26 LAB — CMP (CANCER CENTER ONLY)
ALT: 21 U/L (ref 0–44)
AST: 23 U/L (ref 15–41)
Albumin: 4.2 g/dL (ref 3.5–5.0)
Alkaline Phosphatase: 85 U/L (ref 38–126)
Anion gap: 7 (ref 5–15)
BUN: 17 mg/dL (ref 8–23)
CO2: 28 mmol/L (ref 22–32)
Calcium: 9.6 mg/dL (ref 8.9–10.3)
Chloride: 105 mmol/L (ref 98–111)
Creatinine: 1.02 mg/dL — ABNORMAL HIGH (ref 0.44–1.00)
GFR, Estimated: >60 mL/min (ref >60)
Glucose, Bld: 115 mg/dL — ABNORMAL HIGH (ref 70–99)
Potassium: 3.8 mmol/L (ref 3.5–5.1)
Sodium: 140 mmol/L (ref 135–145)
Total Bilirubin: 0.4 mg/dL (ref 0.3–1.2)
Total Protein: 7.4 g/dL (ref 6.5–8.1)

## 2022-04-26 MED ORDER — SODIUM CHLORIDE 0.9 % IV SOLN: Freq: Once | INTRAVENOUS | Status: AC

## 2022-04-26 MED ORDER — HEPARIN SOD (PORK) LOCK FLUSH 100 UNIT/ML IV SOLN
500.0000 [IU] | Freq: Once | INTRAVENOUS | Status: AC | PRN
  Administered 2022-04-26: 500 [IU]

## 2022-04-26 MED ORDER — SODIUM CHLORIDE 0.9% FLUSH
10.0000 mL | INTRAVENOUS | Status: DC | PRN
  Administered 2022-04-26: 10 mL via INTRAVENOUS

## 2022-04-26 MED ORDER — SODIUM CHLORIDE 0.9% FLUSH
10.0000 mL | INTRAVENOUS | Status: DC | PRN
  Administered 2022-04-26: 10 mL

## 2022-04-26 MED ORDER — SODIUM CHLORIDE 0.9 % IV SOLN
200.0000 mg | Freq: Once | INTRAVENOUS | Status: AC
  Administered 2022-04-26: 200 mg via INTRAVENOUS
  Filled 2022-04-26: qty 200

## 2022-04-26 NOTE — Progress Notes (Signed)
[facility] Cancer Follow up:    Rita Salen, PA-C [address] [place] [address]   DIAGNOSIS:  Cancer Staging  Malignant neoplasm of upper-inner quadrant of right breast in female, estrogen receptor negative (HCC) Staging form: Breast, AJCC 8th Edition - Clinical: Stage IIB (cT2, cN0, cM0, G3, ER-, PR-, HER2-) - Signed by Lowella Dell, MD on 06/09/2021 Histologic grading system: 3 grade system   SUMMARY OF ONCOLOGIC HISTORY: Oncology History  Malignant neoplasm of upper-inner quadrant of right breast in female, estrogen receptor negative (HCC)  06/03/2021 Initial Diagnosis   [place] woman status post right breast upper inner quadrant biopsy 06/03/2021 for a clinical T2N0 invasive ductal carcinoma, grade 3, functionally triple negative, with an MIB-1 of 95%.   06/09/2021 Cancer Staging   Staging form: Breast, AJCC 8th Edition - Clinical: Stage IIB (cT2, cN0, cM0, G3, ER-, PR-, HER2-) - Signed by Lowella Dell, MD on 06/09/2021 Histologic grading system: 3 grade system   06/23/2021 -  Neo-Adjuvant Chemotherapy   neoadjuvant chemotherapy consisting of carboplatin paclitaxel and pembrolizumab for 4 cycles starting 06/23/2021, to be followed by doxorubicin and cyclophosphamide and pembrolizumab for 4 cycles, with pembrolizumab to be continued to complete a year   06/30/2021 Genetic Testing   Patient has genetic testing done for personal history of breast cancer. No pathogenic variants were detected. Of note, a variant of uncertain significance was detected in the BRIP1 and TSC2 genes.  The genes analyzed include: APC, ATM, AXIN2, BARD1, BMPR1A, BRCA1, BRCA2, BRIP1, CDH1, CDK4*, CDKN2A (p14ARF)*, CDKN2A (p16INK4a)*, CHEK2, CTNNA1, DICER1, EPCAM (Deletion/duplication testing only), GREM1 (promoter region deletion/duplication testing only), KIT, MEN1, MLH1, MSH2, MSH3, MSH6, MUTYH, NBN, NF1, NHTL1, PALB2, PDGFRA*, PMS2, POLD1, POLE, PTEN, RAD50, RAD51C,  RAD51D, SDHB, SDHC, SDHD, SMAD4, SMARCA4. STK11, TP53, TSC1, TSC2, and VHL.  The following genes were evaluated for sequence changes only: SDHA and HOXB13 c.251G>A variant only.    11/04/2021 - 11/06/2021 Hospital Admission   Neutropenia related pneumonia   01/01/2022 -  Chemotherapy   Patient is on Treatment Plan : BREAST Pembrolizumab (200) q21d x 27 weeks     03/09/2022 - 04/06/2022 Radiation Therapy   Site Technique Total Dose (Gy) Dose per Fx (Gy) Completed Fx Beam Energies  Breast, Right: Breast_R 3D 42.56/42.56 2.66 16/16 6X, 10X  Breast, Right: Breast_R_Bst 3D 8/8 2 4/4 6X, 10X       CURRENT THERAPY: Pembrolizumab  INTERVAL HISTORY: Rita Davis 61 y.o. female returns for follow-up prior to receiving her maintenance Keytruda.  She continues to tolerate this well and denies any significant concerns today.  She tells me that she is about to go out of town to [place] later this week so long as it is not raining.  She is looking forward to completing her therapy because she cannot wait to have her port removed.    She tells me that she has resumed her normal activities and has no restrictions in these at this time.   Patient Active Problem List   Diagnosis Date Noted   Encounter for antineoplastic immunotherapy 02/10/2022   Bilateral pneumonia 11/05/2021   Pneumonia of both lower lobes due to infectious organism 11/04/2021   Genetic testing 07/10/2021   Port-A-Cath in place 06/24/2021   Family history of breast cancer 06/09/2021   Family history of prostate cancer 06/09/2021   Family history of colon cancer 06/09/2021   Malignant neoplasm of upper-inner quadrant of right breast in female, estrogen receptor negative (HCC) 06/09/2021  Pneumonia 03/21/2018   Fibroid tumor 03/10/2018   Adjustment insomnia 11/26/2015   Eczema 11/26/2015   Other specified abnormal findings of blood chemistry 11/26/2015   Healthcare maintenance 04/10/2014   Anxiety state 02/28/2014   OVERWEIGHT  03/27/2009   Overweight 03/27/2009   HYPERLIPIDEMIA 01/24/2009   ANEMIA 11/20/2007   HYPERTENSION 11/20/2007    has No Known Allergies.  MEDICAL HISTORY: Past Medical History:  Diagnosis Date   Anemia    had a fibroid tumor, was anemic at that time   Anxiety    Cancer [facility])    right breast IDC   Family history of breast cancer    Family history of colon cancer    Family history of prostate cancer    Hyperlipidemia    Hypertension    Pneumonia 11/2021   hospitalized   Pre-diabetes     SURGICAL HISTORY: Past Surgical History:  Procedure Laterality Date   ABDOMINAL HYSTERECTOMY     still has ovaries   BREAST BIOPSY Right 05/23/2012   BREAST CYST EXCISION Right    BREAST LUMPECTOMY WITH RADIOACTIVE SEED AND SENTINEL LYMPH NODE BIOPSY Right 12/21/2021   Procedure: RIGHT BREAST LUMPECTOMY WITH RADIOACTIVE SEED AND SENTINEL LYMPH NODE BIOPSY;  Surgeon: Almond Lint, MD;  Location: [facility];  Service: General;  Laterality: Right;   cyst removed     from lower back   DILATION AND CURETTAGE OF UTERUS     ORIF ANKLE FRACTURE Left 11/13/2020   Procedure: OPEN TREATMENT OF LEFT TRIMALLEOLAR ANKLE FRACTURE WITH POSTERIOR FIXATION, SYNDESMOSIS;  Surgeon: Terance Hart, MD;  Location: [facility];  Service: Orthopedics;  Laterality: Left;  LENGTH OF SURGERY: 1.5 HOURS   PORTACATH PLACEMENT Left 06/18/2021   Procedure: PORT PLACEMENT;  Surgeon: Almond Lint, MD;  Location: [facility];  Service: General;  Laterality: Left;    SOCIAL HISTORY: Social History   Socioeconomic History   Marital status: Married    Spouse name: Not on file   Number of children: Not on file   Years of education: Not on file   Highest education level: Not on file  Occupational History   Not on file  Tobacco Use   Smoking status: Never   Smokeless tobacco: Never  Vaping Use   Vaping Use: Never used  Substance and Sexual Activity   Alcohol use: Yes    Comment: social    Drug use: No   Sexual activity: Not on file  Other Topics Concern   Not on file  Social History Narrative   Not on file   Social Determinants of Health   Financial Resource Strain: Not on file  Food Insecurity: Not on file  Transportation Needs: Not on file  Physical Activity: Not on file  Stress: Not on file  Social Connections: Not on file  Intimate Partner Violence: Not At Risk (11/19/2021)   Humiliation, Afraid, Rape, and Kick questionnaire    Fear of Current or Ex-Partner: No    Emotionally Abused: No    Physically Abused: No    Sexually Abused: No    FAMILY HISTORY: Family History  Problem Relation Age of Onset   Hypertension Mother    Breast cancer Mother 35       declined treatment   Prostate cancer Father        metastatic, dx 37s   Colon cancer Brother 3   Cancer Maternal Great-grandmother 9       gynecologic cancer (MGM's mother)    Review  of Systems  Constitutional:  Negative for appetite change, chills, fatigue, fever and unexpected weight change.  HENT:   Negative for hearing loss, lump/mass and trouble swallowing.   Eyes:  Negative for eye problems and icterus.  Respiratory:  Negative for chest tightness, cough and shortness of breath.   Cardiovascular:  Negative for chest pain, leg swelling and palpitations.  Gastrointestinal:  Negative for abdominal distention, abdominal pain, constipation, diarrhea, nausea and vomiting.  Endocrine: Negative for hot flashes.  Genitourinary:  Negative for difficulty urinating.   Musculoskeletal:  Negative for arthralgias.  Skin:  Negative for itching and rash.  Neurological:  Negative for dizziness, extremity weakness, headaches and numbness.  Hematological:  Negative for adenopathy. Does not bruise/bleed easily.  Psychiatric/Behavioral:  Negative for depression. The patient is not nervous/anxious.       PHYSICAL EXAMINATION  ECOG PERFORMANCE STATUS: 1 - Symptomatic but completely ambulatory  Vitals:    04/26/22 1426  BP: (!) 141/94  Pulse: (!) 118  Resp: 18  Temp: (!) 97.5 F (36.4 C)  SpO2: 100%    Physical Exam Constitutional:      General: She is not in acute distress.    Appearance: Normal appearance. She is not toxic-appearing.  HENT:     Head: Normocephalic and atraumatic.  Eyes:     General: No scleral icterus. Cardiovascular:     Rate and Rhythm: Normal rate and regular rhythm.     Pulses: Normal pulses.     Heart sounds: Normal heart sounds.  Pulmonary:     Effort: Pulmonary effort is normal.     Breath sounds: Normal breath sounds.  Abdominal:     General: Abdomen is flat. Bowel sounds are normal. There is no distension.     Palpations: Abdomen is soft.     Tenderness: There is no abdominal tenderness.  Musculoskeletal:        General: No swelling.     Cervical back: Neck supple.  Lymphadenopathy:     Cervical: No cervical adenopathy.  Skin:    General: Skin is warm and dry.     Findings: No rash.  Neurological:     General: No focal deficit present.     Mental Status: She is alert.  Psychiatric:        Mood and Affect: Mood normal.        Behavior: Behavior normal.     LABORATORY DATA:  CBC    Component Value Date/Time   WBC 7.3 04/26/2022 1403   WBC 2.5 (L) 11/06/2021 0413   RBC 4.12 04/26/2022 1403   HGB 12.1 04/26/2022 1403   HCT 35.4 (L) 04/26/2022 1403   PLT 186 04/26/2022 1403   MCV 85.9 04/26/2022 1403   MCH 29.4 04/26/2022 1403   MCHC 34.2 04/26/2022 1403   RDW 15.9 (H) 04/26/2022 1403   LYMPHSABS 0.9 04/26/2022 1403   MONOABS 0.6 04/26/2022 1403   EOSABS 0.3 04/26/2022 1403   BASOSABS 0.0 04/26/2022 1403    CMP     Component Value Date/Time   NA 140 04/26/2022 1403   K 3.8 04/26/2022 1403   CL 105 04/26/2022 1403   CO2 28 04/26/2022 1403   GLUCOSE 115 (H) 04/26/2022 1403   BUN 17 04/26/2022 1403   CREATININE 1.02 (H) 04/26/2022 1403   CALCIUM 9.6 04/26/2022 1403   PROT 7.4 04/26/2022 1403   ALBUMIN 4.2 04/26/2022 1403    AST 23 04/26/2022 1403   ALT 21 04/26/2022 1403   ALKPHOS 85 04/26/2022 1403  BILITOT 0.4 04/26/2022 1403   GFRNONAA >60 04/26/2022 1403   GFRAA 86 04/18/2008 1652      ASSESSMENT and THERAPY PLAN:   Malignant neoplasm of upper-inner quadrant of right breast in female, estrogen receptor negative (HCC) status post right breast upper inner quadrant biopsy 06/03/2021 for a clinical T2N0 3.3 cm invasive ductal carcinoma, grade 3, functionally triple negative, ER 5%, PR 0%, HER2 2+ by IHC FISH negative, Ki-67 95%    Treatment plan:  1. Neoadjuvant chemotherapy consisting of carboplatin paclitaxel and pembrolizumab for 4 cycles starting 06/23/2021, to be followed by doxorubicin and cyclophosphamide and pembrolizumab for 4 cycles completed 12/09/2021, with pembrolizumab to be continued to complete a year.   2. right Lumpectomy: 0.5 cm grade 2 IDC 0/4 LN Neg, Margins Neg 3.  Adjuvant radiation therapy completed 04/06/2022 ------------------------------------------------------------------------------------------------------------------------------ Treatment plan:   Keytruda every 3 weeks  Rita Davis is here today for follow-up of her breast cancer on Keytruda.  She has no clinical sign of breast cancer recurrence.  She will continue on Keytruda every 3 weeks if she is tolerating this well.  She will complete her therapy in August 2023.  I did go ahead and send her surgeon Dr. Donell Beers a message about getting her port removed after she finishes her last treatment on June 07, 2022.  We also discussed that after her final treatment she will receive her survivorship care plan visit.  I went ahead and sent a schedule message and placed a referral order for this visit to occur.      All questions were answered. The patient knows to call the clinic with any problems, questions or concerns. We can certainly see the patient much sooner if necessary.  Total encounter time:20 minutes*in face-to-face visit  time, chart review, lab review, care coordination, order entry, and documentation of the encounter time.  Lillard Anes, NP 04/26/22 3:01 PM Medical Oncology and Hematology [facility] [address] [place], [place] [address] Tel. [phone]    Fax. [phone]  *Total Encounter Time as defined by the Centers for Medicare and Medicaid Services includes, in addition to the face-to-face time of a patient visit (documented in the note above) non-face-to-face time: obtaining and reviewing outside history, ordering and reviewing medications, tests or procedures, care coordination (communications with other health care professionals or caregivers) and documentation in the medical record.

## 2022-04-27 ENCOUNTER — Telehealth: Payer: Self-pay | Admitting: Adult Health

## 2022-04-27 ENCOUNTER — Other Ambulatory Visit: Payer: Self-pay | Admitting: General Surgery

## 2022-04-27 NOTE — Telephone Encounter (Signed)
Scheduled appointment per 6/26 los. Patient is aware.

## 2022-05-03 ENCOUNTER — Telehealth: Payer: Self-pay

## 2022-05-03 DIAGNOSIS — C50211 Malignant neoplasm of upper-inner quadrant of right female breast: Secondary | ICD-10-CM

## 2022-05-03 NOTE — Research (Signed)
ACCRU-[place]-2102 - TREATMENT OF ESTABLISHED CHEMOTHERAPY-INDUCED NEUROPATHY WITH N-PALMITOYLETHANOLAMIDE, A CANNABIMIMETIC NUTRACEUTICAL: A RANDOMIZED DOUBLE-BLIND PHASE II PILOT TRIAL  Spoke with Ms Yearwood for her Week 6 call. Clarified that the questionnaires we got in the mail are out of order; corrected date/week on the forms.  Pt confirmed: No THC/CBD use No nausea/vomiting/diarrhea She has mailed this week's questionnaires No missed doses No gabapentin use  Confirmed plan for me to call her next Monday.  Marjie Skiff Juandaniel Manfredo, RN, BSN, [facility] She  Her  Hers Clinical Research Nurse [facility] Direct Dial [phone]  Pager [phone] 05/03/2022 4:29 PM

## 2022-05-03 NOTE — Telephone Encounter (Signed)
ACCRU-[place]-2102 - TREATMENT OF ESTABLISHED CHEMOTHERAPY-INDUCED NEUROPATHY WITH N-PALMITOYLETHANOLAMIDE, A CANNABIMIMETIC NUTRACEUTICAL: A RANDOMIZED DOUBLE-BLIND PHASE II PILOT TRIAL  Attempted to reach Rita Davis for her Week 6 call. No answer, left VM.  Marjie Skiff Samra Pesch, RN, BSN, [facility] She  Her  Hers Clinical Research Nurse [place] [phone]  Pager [phone] 05/03/2022 10:44 AM

## 2022-05-06 ENCOUNTER — Inpatient Hospital Stay: Payer: BC Managed Care – PPO

## 2022-05-06 ENCOUNTER — Inpatient Hospital Stay: Payer: BC Managed Care – PPO | Admitting: Hematology and Oncology

## 2022-05-10 ENCOUNTER — Encounter: Payer: Self-pay | Admitting: Hematology and Oncology

## 2022-05-10 ENCOUNTER — Other Ambulatory Visit (HOSPITAL_COMMUNITY): Payer: Self-pay

## 2022-05-10 ENCOUNTER — Telehealth: Payer: Self-pay | Admitting: Hematology and Oncology

## 2022-05-10 ENCOUNTER — Other Ambulatory Visit: Payer: Self-pay

## 2022-05-10 ENCOUNTER — Encounter: Payer: Self-pay | Admitting: Oncology

## 2022-05-10 ENCOUNTER — Telehealth: Payer: Self-pay

## 2022-05-10 MED ORDER — AMLODIPINE-OLMESARTAN 5-40 MG PO TABS: 1.0000 | ORAL_TABLET | Freq: Every morning | ORAL | 0 refills | Status: DC

## 2022-05-10 MED ORDER — AMLODIPINE-OLMESARTAN 5-40 MG PO TABS
1.0000 | ORAL_TABLET | Freq: Every day | ORAL | 0 refills | Status: DC
  Filled 2022-05-10 (×3): qty 30, 30d supply, fill #0

## 2022-05-10 NOTE — Telephone Encounter (Signed)
Per 7/10 phone line pt called and said she was going out of town he day of appointment and needed to reschedule.  Appointment rescheduled per pt request. .

## 2022-05-10 NOTE — Telephone Encounter (Addendum)
ACCRU--2102 - TREATMENT OF ESTABLISHED CHEMOTHERAPY-INDUCED NEUROPATHY WITH N-PALMITOYLETHANOLAMIDE, A CANNABIMIMETIC NUTRACEUTICAL: A RANDOMIZED DOUBLE-BLIND PHASE II PILOT TRIAL  Attempted to reach pt for Week 7 call. No answer, left VM.  Rita Skiff Juaquin Ludington, RN, BSN, [facility] She  Her  Hers Clinical Research Nurse [facility] Direct Dial [phone]  Pager [phone] 05/10/2022 9:09 AM  Rita Davis returned my call.   Confirmed the following: No CBD/THC use. No missed doses. No new nausea/vomiting/diarrhea. She will mail this week's questionnaire asap.  Rita Davis will be out of town/away from her phone next week. We agreed for me to call her on 05/21/22, when she will be back in town with phone access. We will set a time then for bottle return.  Rita Skiff Yanice Maqueda, RN, BSN, [facility] She  Her  Hers Clinical Research Nurse [place] [phone]  Pager [phone] 05/10/2022 9:37 AM

## 2022-05-11 ENCOUNTER — Encounter: Payer: Self-pay | Admitting: Hematology and Oncology

## 2022-05-11 ENCOUNTER — Other Ambulatory Visit (HOSPITAL_COMMUNITY): Payer: Self-pay

## 2022-05-11 ENCOUNTER — Encounter: Payer: Self-pay | Admitting: Oncology

## 2022-05-14 ENCOUNTER — Inpatient Hospital Stay: Payer: BC Managed Care – PPO | Attending: Genetic Counselor

## 2022-05-14 ENCOUNTER — Other Ambulatory Visit: Payer: Self-pay

## 2022-05-14 ENCOUNTER — Inpatient Hospital Stay: Payer: BC Managed Care – PPO

## 2022-05-14 VITALS — BP 126/87 | HR 80 | Temp 98.7°F | Resp 18 | Wt 181.2 lb

## 2022-05-14 DIAGNOSIS — Z79899 Other long term (current) drug therapy: Secondary | ICD-10-CM | POA: Insufficient documentation

## 2022-05-14 DIAGNOSIS — Z95828 Presence of other vascular implants and grafts: Secondary | ICD-10-CM

## 2022-05-14 DIAGNOSIS — Z8042 Family history of malignant neoplasm of prostate: Secondary | ICD-10-CM | POA: Diagnosis not present

## 2022-05-14 DIAGNOSIS — Z803 Family history of malignant neoplasm of breast: Secondary | ICD-10-CM | POA: Insufficient documentation

## 2022-05-14 DIAGNOSIS — Z171 Estrogen receptor negative status [ER-]: Secondary | ICD-10-CM | POA: Insufficient documentation

## 2022-05-14 DIAGNOSIS — C50211 Malignant neoplasm of upper-inner quadrant of right female breast: Secondary | ICD-10-CM | POA: Insufficient documentation

## 2022-05-14 DIAGNOSIS — Z8249 Family history of ischemic heart disease and other diseases of the circulatory system: Secondary | ICD-10-CM | POA: Diagnosis not present

## 2022-05-14 DIAGNOSIS — Z800 Family history of malignant neoplasm of digestive organs: Secondary | ICD-10-CM | POA: Insufficient documentation

## 2022-05-14 DIAGNOSIS — Z8049 Family history of malignant neoplasm of other genital organs: Secondary | ICD-10-CM | POA: Insufficient documentation

## 2022-05-14 DIAGNOSIS — Z5112 Encounter for antineoplastic immunotherapy: Secondary | ICD-10-CM | POA: Diagnosis not present

## 2022-05-14 LAB — CMP (CANCER CENTER ONLY)
ALT: 22 U/L (ref 0–44)
AST: 22 U/L (ref 15–41)
Albumin: 4.2 g/dL (ref 3.5–5.0)
Alkaline Phosphatase: 94 U/L (ref 38–126)
Anion gap: 5 (ref 5–15)
BUN: 15 mg/dL (ref 8–23)
CO2: 28 mmol/L (ref 22–32)
Calcium: 9.6 mg/dL (ref 8.9–10.3)
Chloride: 107 mmol/L (ref 98–111)
Creatinine: 0.99 mg/dL (ref 0.44–1.00)
GFR, Estimated: >60 mL/min (ref >60)
Glucose, Bld: 122 mg/dL — ABNORMAL HIGH (ref 70–99)
Potassium: 3.7 mmol/L (ref 3.5–5.1)
Sodium: 140 mmol/L (ref 135–145)
Total Bilirubin: 0.3 mg/dL (ref 0.3–1.2)
Total Protein: 7.3 g/dL (ref 6.5–8.1)

## 2022-05-14 LAB — CBC WITH DIFFERENTIAL (CANCER CENTER ONLY)
Abs Immature Granulocytes: 0.02 10*3/uL (ref 0.00–0.07)
Basophils Absolute: 0 10*3/uL (ref 0.0–0.1)
Basophils Relative: 1 %
Eosinophils Absolute: 0.3 10*3/uL (ref 0.0–0.5)
Eosinophils Relative: 5 %
HCT: 34.8 % — ABNORMAL LOW (ref 36.0–46.0)
Hemoglobin: 11.9 g/dL — ABNORMAL LOW (ref 12.0–15.0)
Immature Granulocytes: 0 %
Lymphocytes Relative: 17 %
Lymphs Abs: 0.8 10*3/uL (ref 0.7–4.0)
MCH: 29.1 pg (ref 26.0–34.0)
MCHC: 34.2 g/dL (ref 30.0–36.0)
MCV: 85.1 fL (ref 80.0–100.0)
Monocytes Absolute: 0.4 10*3/uL (ref 0.1–1.0)
Monocytes Relative: 7 %
Neutro Abs: 3.5 10*3/uL (ref 1.7–7.7)
Neutrophils Relative %: 70 %
Platelet Count: 272 10*3/uL (ref 150–400)
RBC: 4.09 MIL/uL (ref 3.87–5.11)
RDW: 15.4 % (ref 11.5–15.5)
WBC Count: 5 10*3/uL (ref 4.0–10.5)
nRBC: 0 % (ref 0.0–0.2)

## 2022-05-14 LAB — TSH: TSH: 3.14 u[IU]/mL (ref 0.350–4.500)

## 2022-05-14 MED ORDER — SODIUM CHLORIDE 0.9 % IV SOLN
200.0000 mg | Freq: Once | INTRAVENOUS | Status: AC
  Administered 2022-05-14: 200 mg via INTRAVENOUS
  Filled 2022-05-14: qty 200

## 2022-05-14 MED ORDER — HEPARIN SOD (PORK) LOCK FLUSH 100 UNIT/ML IV SOLN
500.0000 [IU] | Freq: Once | INTRAVENOUS | Status: AC | PRN
  Administered 2022-05-14: 500 [IU]

## 2022-05-14 MED ORDER — SODIUM CHLORIDE 0.9% FLUSH
10.0000 mL | INTRAVENOUS | Status: DC | PRN
  Administered 2022-05-14: 10 mL via INTRAVENOUS

## 2022-05-14 MED ORDER — SODIUM CHLORIDE 0.9 % IV SOLN: Freq: Once | INTRAVENOUS | Status: AC

## 2022-05-14 MED ORDER — SODIUM CHLORIDE 0.9% FLUSH
10.0000 mL | INTRAVENOUS | Status: DC | PRN
  Administered 2022-05-14: 10 mL

## 2022-05-14 NOTE — Patient Instructions (Signed)
[facility] MEDICAL ONCOLOGY  Discharge Instructions: Thank you for choosing [facility] to provide your oncology and hematology care.   If you have a lab appointment with the Cancer Center, please go directly to the Cancer Center and check in at the registration area.   Wear comfortable clothing and clothing appropriate for easy access to any Portacath or PICC line.   We strive to give you quality time with your provider. You may need to reschedule your appointment if you arrive late (15 or more minutes).  Arriving late affects you and other patients whose appointments are after yours.  Also, if you miss three or more appointments without notifying the office, you may be dismissed from the clinic at the provider's discretion.      For prescription refill requests, have your pharmacy contact our office and allow 72 hours for refills to be completed.    Today you received the following chemotherapy and/or immunotherapy agents: Keytruda      To help prevent nausea and vomiting after your treatment, we encourage you to take your nausea medication as directed.  BELOW ARE SYMPTOMS THAT SHOULD BE REPORTED IMMEDIATELY: *FEVER GREATER THAN 100.4 F (38 C) OR HIGHER *CHILLS OR SWEATING *NAUSEA AND VOMITING THAT IS NOT CONTROLLED WITH YOUR NAUSEA MEDICATION *UNUSUAL SHORTNESS OF BREATH *UNUSUAL BRUISING OR BLEEDING *URINARY PROBLEMS (pain or burning when urinating, or frequent urination) *BOWEL PROBLEMS (unusual diarrhea, constipation, pain near the anus) TENDERNESS IN MOUTH AND THROAT WITH OR WITHOUT PRESENCE OF ULCERS (sore throat, sores in mouth, or a toothache) UNUSUAL RASH, SWELLING OR PAIN  UNUSUAL VAGINAL DISCHARGE OR ITCHING   Items with * indicate a potential emergency and should be followed up as soon as possible or go to the Emergency Department if any problems should occur.  Please show the CHEMOTHERAPY ALERT CARD or IMMUNOTHERAPY ALERT CARD at check-in to  the Emergency Department and triage nurse.  Should you have questions after your visit or need to cancel or reschedule your appointment, please contact [facility]ICAL [facility]  Dept: [phone]  and follow the prompts.  Office hours are 8:00 a.m. to 4:30 p.m. Monday - Friday. Please note that voicemails left after 4:00 p.m. may not be returned until the following business day.  We are closed weekends and major holidays. You have access to a nurse at all times for urgent questions. Please call the main number to the clinic Dept: [phone] and follow the prompts.   For any non-urgent questions, you may also contact your provider using MyChart. We now offer e-Visits for anyone 18 and older to request care online for non-urgent symptoms. For details visit [email or web address].   Also download the MyChart app! Go to the app store, search "MyChart", open the app, select [facility], and log in with your MyChart username and password.  Masks are optional in the cancer centers. If you would like for your care team to wear a mask while they are taking care of you, please let them know. For doctor visits, patients may have with them one support person who is at least 61 years old. At this time, visitors are not allowed in the infusion area. 

## 2022-05-17 ENCOUNTER — Inpatient Hospital Stay: Payer: BC Managed Care – PPO

## 2022-05-18 NOTE — Progress Notes (Signed)
  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 She developed fatigue and anticipated skin changes in the treatment field. Her skin changes have resolved and she's been using sunscreen when her skin in the upper right chest is exposed to sun.    Impression/Plan: 1. 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 The patient has been doing well since completion of radiotherapy. We discussed that we would be happy to continue to follow her as needed, but she will also continue to follow up with Dr. Lindi Adie in medical oncology. She was counseled on skin care as well as measures to avoid sun exposure to this area.  2. Survivorship. We discussed the importance of survivorship evaluation and encouraged her to attend her upcoming visit with that clinic.      Carola Rhine, PAC

## 2022-05-21 ENCOUNTER — Encounter: Payer: Self-pay | Admitting: Emergency Medicine

## 2022-05-21 DIAGNOSIS — Z171 Estrogen receptor negative status [ER-]: Secondary | ICD-10-CM

## 2022-05-21 NOTE — Research (Signed)
ACCRU-[place]-2102 - TREATMENT OF ESTABLISHED CHEMOTHERAPY-INDUCED NEUROPATHY WITH N-PALMITOYLETHANOLAMIDE, A CANNABIMIMETIC NUTRACEUTICAL: A RANDOMIZED DOUBLE-BLIND PHASE II PILOT TRIAL  WEEK 8 PHONE CALL   Patient was contacted via phone today (05/21/2022) at 10:34 AM to complete study week 8 phone call. Identity was confirmed using two patient identifiers.              QUESTIONNAIRE: Patient reports they have completed their weekly questionnaire and mailed them out.   INVESTIGATIONAL PRODUCT: Patient reports taking all of the required capsules for this study week without any missed doses.  Patient is Always compliant with study medication (taking one pill daily with food).  Patient has completed drug therapy and has been told to stop taking study drug- verbalized understanding.   ADVERSE EVENTS: USING CTCAE V. 5.0 Per protocol (Appendix VIII) pt was asked about nausea, vomiting, and diarrhea.  Patient denies all three.  Pt does not report any other AE's, no action or reporting necessary at this time.   MEDICATIONS: Patient denies any new allergies or medications.  Pt denies any changes in their medications or other treatments (per protocol, Appendix VIII).  Patient denies taking any other cannabinoid products like CBD and/or THC.   STUDY DRUG COMPLETION:             Patient's treatment plan has been d/c.  Patient to return meds on 05/24/22.  Pt aware to call before then with any questions or concerns. The patient was thanked for their time and continued voluntary participation in this study.  Patient has requested to be unblinded when able- will alert Dr. Lindi Adie.  Patient has been provided direct contact information and is encouraged to contact this Nurse for any needs or questions.  Wells Guiles 'Learta CoddingNeysa Bonito, RN, BSN Clinical Research Nurse I 05/21/22 10:42 AM

## 2022-05-24 ENCOUNTER — Other Ambulatory Visit: Payer: Self-pay

## 2022-05-24 ENCOUNTER — Ambulatory Visit
Admission: RE | Admit: 2022-05-24 | Discharge: 2022-05-24 | Disposition: A | Discharge status: Home / Self Care | Payer: BC Managed Care – PPO | Source: Ambulatory Visit | Attending: Radiation Oncology | Admitting: Radiation Oncology

## 2022-05-24 DIAGNOSIS — Z171 Estrogen receptor negative status [ER-]: Secondary | ICD-10-CM

## 2022-05-24 NOTE — Research (Unsigned)
ACCRU-[place]-2102 - TREATMENT OF ESTABLISHED CHEMOTHERAPY-INDUCED NEUROPATHY WITH N-PALMITOYLETHANOLAMIDE, A CANNABIMIMETIC NUTRACEUTICAL: A RANDOMIZED DOUBLE-BLIND PHASE II PILOT TRIAL   Ms Robinson returned 2 prescription bottles labelled for research. One was empty; one had 5 white oblong pills in it (counted by pharmacy).

## 2022-05-25 ENCOUNTER — Telehealth: Payer: Self-pay

## 2022-05-25 NOTE — Telephone Encounter (Addendum)
ACCRU--2102 - TREATMENT OF ESTABLISHED CHEMOTHERAPY-INDUCED NEUROPATHY WITH N-PALMITOYLETHANOLAMIDE, A CANNABIMIMETIC NUTRACEUTICAL: A RANDOMIZED DOUBLE-BLIND PHASE II PILOT TRIAL  Patient has been unblinded. She was on '400mg'$  PEA daily. Dr Lindi Adie agreed with patient continuing PEA.  I called Rita Davis to let her know her dose and that the study recommends "micronized" PEA. Per her request, I also emailed her information on her dose and where it is available.  Rita Skiff Kalene Cutler, RN, BSN, [facility] She  Her  Hers Clinical Research Nurse [facility] Direct Dial [phone]  Pager [phone] 05/25/2022 3:02 PM

## 2022-05-27 ENCOUNTER — Inpatient Hospital Stay: Payer: BC Managed Care – PPO

## 2022-06-01 ENCOUNTER — Encounter (HOSPITAL_COMMUNITY): Payer: Self-pay | Admitting: General Surgery

## 2022-06-01 ENCOUNTER — Other Ambulatory Visit: Payer: Self-pay

## 2022-06-01 NOTE — Progress Notes (Signed)
Spoke with pt for pre-op call. Pt denies cardiac history. Pt is treated for HTN. Pt is pre-diabetic, last A1C was 5.4 in May.   Shower instructions given to pt and she voiced understanding.

## 2022-06-03 ENCOUNTER — Ambulatory Visit (HOSPITAL_COMMUNITY)
Admission: RE | Admit: 2022-06-03 | Discharge: 2022-06-03 | Disposition: A | Discharge status: Home / Self Care | Payer: BC Managed Care – PPO | Attending: General Surgery | Admitting: General Surgery

## 2022-06-03 ENCOUNTER — Ambulatory Visit (HOSPITAL_COMMUNITY): Payer: BC Managed Care – PPO | Admitting: Anesthesiology

## 2022-06-03 ENCOUNTER — Other Ambulatory Visit: Payer: Self-pay

## 2022-06-03 ENCOUNTER — Encounter (HOSPITAL_COMMUNITY)
Admission: RE | Disposition: A | Discharge status: Home / Self Care | Payer: Self-pay | Source: Home / Self Care | Attending: General Surgery

## 2022-06-03 ENCOUNTER — Encounter (HOSPITAL_COMMUNITY): Payer: Self-pay | Admitting: General Surgery

## 2022-06-03 DIAGNOSIS — I10 Essential (primary) hypertension: Secondary | ICD-10-CM | POA: Diagnosis not present

## 2022-06-03 DIAGNOSIS — Z9221 Personal history of antineoplastic chemotherapy: Secondary | ICD-10-CM | POA: Diagnosis not present

## 2022-06-03 DIAGNOSIS — Z452 Encounter for adjustment and management of vascular access device: Secondary | ICD-10-CM | POA: Diagnosis present

## 2022-06-03 DIAGNOSIS — Z79899 Other long term (current) drug therapy: Secondary | ICD-10-CM | POA: Insufficient documentation

## 2022-06-03 DIAGNOSIS — Z853 Personal history of malignant neoplasm of breast: Secondary | ICD-10-CM | POA: Diagnosis not present

## 2022-06-03 HISTORY — DX: Polyneuropathy, unspecified: G62.9

## 2022-06-03 HISTORY — PX: PORT-A-CATH REMOVAL: SHX5289

## 2022-06-03 SURGERY — REMOVAL PORT-A-CATH
Anesthesia: Monitor Anesthesia Care | Site: Chest

## 2022-06-03 MED ORDER — CHLORHEXIDINE GLUCONATE 0.12 % MT SOLN
15.0000 mL | Freq: Once | OROMUCOSAL | Status: AC
  Administered 2022-06-03: 15 mL via OROMUCOSAL
  Filled 2022-06-03: qty 15

## 2022-06-03 MED ORDER — KETOROLAC TROMETHAMINE 30 MG/ML IJ SOLN
INTRAMUSCULAR | Status: DC | PRN
  Administered 2022-06-03: 30 mg via INTRAVENOUS

## 2022-06-03 MED ORDER — LIDOCAINE HCL 1 % IJ SOLN
INTRAMUSCULAR | Status: DC | PRN
  Administered 2022-06-03: 13 mL

## 2022-06-03 MED ORDER — CEFAZOLIN SODIUM-DEXTROSE 2-4 GM/100ML-% IV SOLN
2.0000 g | INTRAVENOUS | Status: AC
  Administered 2022-06-03 (×2): 2 g via INTRAVENOUS
  Filled 2022-06-03: qty 100

## 2022-06-03 MED ORDER — ACETAMINOPHEN 500 MG PO TABS
ORAL_TABLET | ORAL | Status: AC
  Filled 2022-06-03: qty 2

## 2022-06-03 MED ORDER — CHLORHEXIDINE GLUCONATE CLOTH 2 % EX PADS: 6.0000 | MEDICATED_PAD | Freq: Once | CUTANEOUS | Status: DC

## 2022-06-03 MED ORDER — LIDOCAINE 2% (20 MG/ML) 5 ML SYRINGE
INTRAMUSCULAR | Status: DC | PRN
  Administered 2022-06-03: 80 mg via INTRAVENOUS

## 2022-06-03 MED ORDER — DEXAMETHASONE SODIUM PHOSPHATE 10 MG/ML IJ SOLN
INTRAMUSCULAR | Status: DC | PRN
  Administered 2022-06-03: 10 mg via INTRAVENOUS

## 2022-06-03 MED ORDER — FENTANYL CITRATE (PF) 250 MCG/5ML IJ SOLN
INTRAMUSCULAR | Status: AC
  Filled 2022-06-03: qty 5

## 2022-06-03 MED ORDER — PROPOFOL 500 MG/50ML IV EMUL
INTRAVENOUS | Status: DC | PRN
  Administered 2022-06-03: 100 ug/kg/min via INTRAVENOUS

## 2022-06-03 MED ORDER — LIDOCAINE HCL (PF) 1 % IJ SOLN
INTRAMUSCULAR | Status: AC
  Filled 2022-06-03: qty 30

## 2022-06-03 MED ORDER — OXYCODONE HCL 5 MG PO TABS: 5.0000 mg | ORAL_TABLET | Freq: Four times a day (QID) | ORAL | 0 refills | Status: DC | PRN

## 2022-06-03 MED ORDER — ACETAMINOPHEN 500 MG PO TABS
1000.0000 mg | ORAL_TABLET | ORAL | Status: AC
  Administered 2022-06-03: 1000 mg via ORAL

## 2022-06-03 MED ORDER — 0.9 % SODIUM CHLORIDE (POUR BTL) OPTIME
TOPICAL | Status: DC | PRN
  Administered 2022-06-03: 1000 mL

## 2022-06-03 MED ORDER — ORAL CARE MOUTH RINSE: 15.0000 mL | Freq: Once | OROMUCOSAL | Status: AC

## 2022-06-03 MED ORDER — FENTANYL CITRATE (PF) 250 MCG/5ML IJ SOLN
INTRAMUSCULAR | Status: DC | PRN
  Administered 2022-06-03: 50 ug via INTRAVENOUS
  Administered 2022-06-03: 100 ug via INTRAVENOUS

## 2022-06-03 MED ORDER — FENTANYL CITRATE (PF) 100 MCG/2ML IJ SOLN: 25.0000 ug | INTRAMUSCULAR | Status: DC | PRN

## 2022-06-03 MED ORDER — LACTATED RINGERS IV SOLN: INTRAVENOUS | Status: DC

## 2022-06-03 MED ORDER — ONDANSETRON HCL 4 MG/2ML IJ SOLN
INTRAMUSCULAR | Status: DC | PRN
  Administered 2022-06-03: 4 mg via INTRAVENOUS

## 2022-06-03 MED ORDER — BUPIVACAINE-EPINEPHRINE (PF) 0.25% -1:200000 IJ SOLN
INTRAMUSCULAR | Status: AC
  Filled 2022-06-03: qty 30

## 2022-06-03 MED ORDER — ACETAMINOPHEN 500 MG PO TABS
1000.0000 mg | ORAL_TABLET | Freq: Once | ORAL | Status: AC
Start: 2022-06-03 — End: 2022-06-03
  Filled 2022-06-03: qty 2

## 2022-06-03 SURGICAL SUPPLY — 29 items
ADH SKN CLS APL DERMABOND .7 (GAUZE/BANDAGES/DRESSINGS) ×1
BAG COUNTER SPONGE SURGICOUNT (BAG) ×2 IMPLANT
BAG SPNG CNTER NS LX DISP (BAG) ×1
CHLORAPREP W/TINT 10.5 ML (MISCELLANEOUS) ×2 IMPLANT
COVER SURGICAL LIGHT HANDLE (MISCELLANEOUS) ×2 IMPLANT
DERMABOND ADVANCED (GAUZE/BANDAGES/DRESSINGS) ×1
DERMABOND ADVANCED .7 DNX12 (GAUZE/BANDAGES/DRESSINGS) ×1 IMPLANT
DRAPE LAPAROTOMY 100X72 PEDS (DRAPES) ×2 IMPLANT
ELECT CAUTERY BLADE 6.4 (BLADE) ×2 IMPLANT
ELECT REM PT RETURN 9FT ADLT (ELECTROSURGICAL) ×2
ELECTRODE REM PT RTRN 9FT ADLT (ELECTROSURGICAL) ×1 IMPLANT
GAUZE 4X4 16PLY ~~LOC~~+RFID DBL (SPONGE) ×2 IMPLANT
GLOVE BIO SURGEON STRL SZ 6 (GLOVE) ×2 IMPLANT
GLOVE INDICATOR 6.5 STRL GRN (GLOVE) ×2 IMPLANT
GOWN STRL REUS W/ TWL LRG LVL3 (GOWN DISPOSABLE) ×1 IMPLANT
GOWN STRL REUS W/TWL 2XL LVL3 (GOWN DISPOSABLE) ×2 IMPLANT
GOWN STRL REUS W/TWL LRG LVL3 (GOWN DISPOSABLE) ×2
KIT BASIN OR (CUSTOM PROCEDURE TRAY) ×2 IMPLANT
KIT TURNOVER KIT B (KITS) ×2 IMPLANT
NDL HYPO 25GX1X1/2 BEV (NEEDLE) ×1 IMPLANT
NEEDLE HYPO 25GX1X1/2 BEV (NEEDLE) ×2 IMPLANT
NS IRRIG 1000ML POUR BTL (IV SOLUTION) ×2 IMPLANT
PACK GENERAL/GYN (CUSTOM PROCEDURE TRAY) ×2 IMPLANT
PAD ARMBOARD 7.5X6 YLW CONV (MISCELLANEOUS) ×4 IMPLANT
SUT MON AB 4-0 PC3 18 (SUTURE) ×2 IMPLANT
SUT VIC AB 3-0 SH 27 (SUTURE) ×2
SUT VIC AB 3-0 SH 27X BRD (SUTURE) ×1 IMPLANT
SYR CONTROL 10ML LL (SYRINGE) ×2 IMPLANT
TOWEL GREEN STERILE (TOWEL DISPOSABLE) ×2 IMPLANT

## 2022-06-03 NOTE — Transfer of Care (Signed)
Immediate Anesthesia Transfer of Care Note  Patient: Rita Davis  Procedure(s) Performed: REMOVAL PORT-A-CATH (Chest)  Patient Location: PACU  Anesthesia Type:General  Level of Consciousness: awake, alert  and oriented  Airway & Oxygen Therapy: Patient Spontanous Breathing  Post-op Assessment: Report given to RN and Post -op Vital signs reviewed and stable  Post vital signs: Reviewed and stable  Last Vitals:  Vitals Value Taken Time  BP 104/52 06/03/22 0955  Temp 36.5 C 06/03/22 0955  Pulse 72 06/03/22 0959  Resp 14 06/03/22 0959  SpO2 92 % 06/03/22 0959  Vitals shown include unvalidated device data.  Last Pain:  Vitals:   06/03/22 0955  TempSrc:   PainSc: 0-No pain         Complications: No notable events documented.

## 2022-06-03 NOTE — H&P (Signed)
Rita Davis is an 61 y.o. female.   Chief Complaint: port in place HPI:  Pt is a 61 yo F s/p right breast conservation and chemo for right breast cancer.  She has completed treatment and desires port removal.   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 ([place])   Pneumonia 11/2021   hospitalized   Pre-diabetes     Past Surgical History:  Procedure Laterality Date   ABDOMINAL HYSTERECTOMY     still has ovaries   BREAST BIOPSY Right 05/23/2012   BREAST CYST EXCISION Right    BREAST LUMPECTOMY WITH RADIOACTIVE SEED AND SENTINEL LYMPH NODE BIOPSY Right 12/21/2021   Procedure: RIGHT BREAST LUMPECTOMY WITH RADIOACTIVE SEED AND SENTINEL LYMPH NODE BIOPSY;  Surgeon: Stark Klein, MD;  Location: [facility];  Service: General;  Laterality: Right;   cyst removed     from lower back   [place]     ORIF ANKLE FRACTURE Left 11/13/2020   Procedure: OPEN TREATMENT OF LEFT TRIMALLEOLAR ANKLE FRACTURE WITH POSTERIOR FIXATION, SYNDESMOSIS;  Surgeon: Erle Crocker, MD;  Location: [place];  Service: Orthopedics;  Laterality: Left;  LENGTH OF SURGERY: 1.5 HOURS   PORTACATH PLACEMENT Left 06/18/2021   Procedure: PORT PLACEMENT;  Surgeon: Stark Klein, MD;  Location: [facility];  Service: General;  Laterality: Left;    Family History  Problem Relation Age of Onset   Hypertension Mother    Breast cancer Mother 49       declined treatment   Prostate cancer Father        metastatic, dx 77s   Colon cancer Brother 33   Cancer Maternal Great-grandmother 58       gynecologic cancer (MGM's mother)   Social History:  reports that she has never smoked. She has never used  smokeless tobacco. She reports current alcohol use. She reports that she does not use drugs.  Allergies: No Known Allergies  Medications Prior to Admission  Medication Sig Dispense Refill   acetaminophen (TYLENOL) 500 MG tablet Take 1,000 mg by mouth every 6 (six) hours as needed for mild pain.     amLODipine-olmesartan (AZOR) 5-40 MG tablet Take 1 tablet by mouth daily. 30 tablet 0   benzonatate (TESSALON) 100 MG capsule Take 1 capsule (100 mg total) by mouth 3 (three) times daily as needed for cough. 21 capsule 0   hyaluronate sodium (RADIAPLEXRX) GEL Apply 1 Application topically daily as needed (burnes/ Skin irritation).     Investigational palmitoylethanolamide/placebo 400 MG capsule ACCRU-[place]-2102 Take 400 mg by mouth daily. Take with food and swallow whole. Do not crush or open capsule. Store at room temperature. Keep container tightly closed.     Misc Natural Products (SUPER GREENS) POWD Take 1 Scoop by mouth daily.     Multiple Vitamins-Minerals (MULTIVITAMIN WITH MINERALS) tablet Take 1 tablet by mouth daily. Micro daily     rosuvastatin (CRESTOR) 20 MG tablet Take 1 tablet (20 mg total) by mouth daily.      No results found for this or any previous visit (from the  past 48 hour(s)). No results found.  Review of Systems  All other systems reviewed and are negative.   Blood pressure 120/78, pulse 78, temperature 97.6 F (36.4 C), temperature source Oral, resp. rate 18, height '5\' 8"'$  (1.727 m), weight 81.6 kg, SpO2 98 %. Physical Exam Vitals reviewed.  Constitutional:      General: She is not in acute distress. HENT:     Head: Normocephalic and atraumatic.     Right Ear: External ear normal.     Left Ear: External ear normal.     Mouth/Throat:     Mouth: Mucous membranes are moist.  Eyes:     General: No scleral icterus.    Pupils: Pupils are equal, round, and reactive to light.  Cardiovascular:     Rate and Rhythm: Normal rate.     Pulses: Normal pulses.  Pulmonary:      Effort: Pulmonary effort is normal.     Comments: Left port site intact.   Abdominal:     General: Abdomen is flat.     Palpations: Abdomen is soft.  Musculoskeletal:        General: No deformity. Normal range of motion.     Cervical back: Neck supple.  Skin:    General: Skin is warm and dry.     Capillary Refill: Capillary refill takes 2 to 3 seconds.  Neurological:     General: No focal deficit present.     Mental Status: She is alert and oriented to person, place, and time.     Cranial Nerves: No cranial nerve deficit.     Sensory: No sensory deficit.  Psychiatric:        Mood and Affect: Mood normal.        Behavior: Behavior normal.        Thought Content: Thought content normal.        Judgment: Judgment normal.      Assessment/Plan Port in place Right breast cancer.  Plan port removal. Procedure and risks discussed as well as recovery Pt desires to proceed.    Stark Klein, MD 06/03/2022, 8:59 AM

## 2022-06-03 NOTE — Discharge Instructions (Addendum)
[place] Office Phone Number [phone]   POST OP INSTRUCTIONS  Always review your discharge instruction sheet given to you by the facility where your surgery was performed.  IF YOU HAVE DISABILITY OR FAMILY LEAVE FORMS, YOU MUST BRING THEM TO THE OFFICE FOR PROCESSING.  DO NOT GIVE THEM TO YOUR DOCTOR.  Take 2 tylenol (acetominophen) three times a day for 3 days.  If you still have pain, add ibuprofen with food in between if able to take this (if you have kidney issues or stomach issues, do not take ibuprofen).  If both of those are not enough, add the narcotic pain pill.  If you find you are needing a lot of this overnight after surgery, call the next morning for a refill.   Take your usually prescribed medications unless otherwise directed If you need a refill on your pain medication, please contact your pharmacy.  They will contact our office to request authorization.  Prescriptions will not be filled after 5pm or on week-ends. You should eat very light the first 24 hours after surgery, such as soup, crackers, pudding, etc.  Resume your normal diet the day after surgery It is common to experience some constipation if taking pain medication after surgery.  Increasing fluid intake and taking a stool softener will usually help or prevent this problem from occurring.  A mild laxative (Milk of Magnesia or Miralax) should be taken according to package directions if there are no bowel movements after 48 hours. You may shower in 48 hours.  The surgical glue will flake off in 2-3 weeks.   ACTIVITIES:  No strenuous activity or heavy lifting for 1 week.   You may drive when you no longer are taking prescription pain medication, you can comfortably wear a seatbelt, and you can safely maneuver your car and apply brakes. RETURN TO WORK:  __________2-4 days if applicable. _______________ Dennis Bast should see your doctor in the office for a follow-up appointment approximately three-four weeks  after your surgery.    WHEN TO CALL YOUR DOCTOR: Fever over 101.0 Nausea and/or vomiting. Extreme swelling or bruising. Continued bleeding from incision. Increased pain, redness, or drainage from the incision.  The clinic staff is available to answer your questions during regular business hours.  Please don't hesitate to call and ask to speak to one of the nurses for clinical concerns.  If you have a medical emergency, go to the nearest emergency room or call 911.  A surgeon from [facility] is always on call at the hospital.  For further questions, please visit [email or web address]

## 2022-06-03 NOTE — Anesthesia Postprocedure Evaluation (Signed)
Anesthesia Post Note  Patient: Rita Davis  Procedure(s) Performed: REMOVAL PORT-A-CATH (Chest)     Patient location during evaluation: PACU Anesthesia Type: MAC Level of consciousness: awake and alert Pain management: pain level controlled Vital Signs Assessment: post-procedure vital signs reviewed and stable Respiratory status: spontaneous breathing, nonlabored ventilation and respiratory function stable Cardiovascular status: stable and blood pressure returned to baseline Postop Assessment: no apparent nausea or vomiting Anesthetic complications: no   No notable events documented.  Last Vitals:  Vitals:   06/03/22 0955 06/03/22 1010  BP: (!) 104/52 106/65  Pulse: 75 63  Resp: 17 15  Temp: 36.5 C 36.5 C  SpO2: 94% 95%    Last Pain:  Vitals:   06/03/22 1010  TempSrc:   PainSc: 0-No pain                 Anyela Napierkowski,W. EDMOND

## 2022-06-03 NOTE — Anesthesia Preprocedure Evaluation (Addendum)
Anesthesia Evaluation  Patient identified by MRN, date of birth, ID band Patient awake    Reviewed: Allergy & Precautions, H&P , NPO status , Patient's Chart, lab work & pertinent test results  Airway Mallampati: II  TM Distance: >3 FB Neck ROM: Full    Dental no notable dental hx. (+) Teeth Intact, Dental Advisory Given   Pulmonary neg pulmonary ROS,    Pulmonary exam normal breath sounds clear to auscultation       Cardiovascular hypertension, Pt. on medications  Rhythm:Regular Rate:Normal     Neuro/Psych Anxiety negative neurological ROS     GI/Hepatic negative GI ROS, Neg liver ROS,   Endo/Other  negative endocrine ROS  Renal/GU negative Renal ROS  negative genitourinary   Musculoskeletal   Abdominal   Peds  Hematology  (+) Blood dyscrasia, anemia ,   Anesthesia Other Findings   Reproductive/Obstetrics negative OB ROS                            Anesthesia Physical Anesthesia Plan  ASA: 2  Anesthesia Plan: MAC   Post-op Pain Management: Tylenol PO (pre-op)* and Toradol IV (intra-op)*   Induction: Intravenous  PONV Risk Score and Plan: 3 and Propofol infusion, Ondansetron and Dexamethasone  Airway Management Planned: Natural Airway and Simple Face Mask  Additional Equipment:   Intra-op Plan:   Post-operative Plan:   Informed Consent: I have reviewed the patients History and Physical, chart, labs and discussed the procedure including the risks, benefits and alternatives for the proposed anesthesia with the patient or authorized representative who has indicated his/her understanding and acceptance.     Dental advisory given  Plan Discussed with: CRNA  Anesthesia Plan Comments:         Anesthesia Quick Evaluation

## 2022-06-03 NOTE — Op Note (Signed)
  PRE-OPERATIVE DIAGNOSIS:  un-needed Port-A-Cath for right breast cancer  POST-OPERATIVE DIAGNOSIS:  Same   PROCEDURE:  Procedure(s):  REMOVAL PORT-A-CATH  SURGEON:  Surgeon(s):  Stark Klein, MD  ANESTHESIA:   MAC + local  EBL:   Minimal  SPECIMEN:  None  Complications : none known  Procedure:   Pt was  identified in the holding area and taken to the operating room where she was placed supine on the operating room table.  MAC anesthesia was induced.  The left upper chest was prepped and draped.  The prior incision was anesthetized with local anesthetic.  The incision was opened with a #15 blade.  The subcutaneous tissue was divided with the cautery.  The port was identified and the capsule opened.  The four 2-0 prolene sutures were removed.  The port was then removed and pressure held on the tract.  The catheter appeared intact without evidence of breakage, length was 25 cm.  The wound was inspected for hemostasis, which was achieved with cautery.  The wound was closed with 3-0 vicryl deep dermal interrupted sutures and 4-0 Monocryl running subcuticular suture.  The wound was cleaned, dried, and dressed with dermabond.  The patient was awakened from anesthesia and taken to the PACU in stable condition.  Needle, sponge, and instrument counts are correct.

## 2022-06-04 ENCOUNTER — Encounter (HOSPITAL_COMMUNITY): Payer: Self-pay | Admitting: General Surgery

## 2022-06-07 ENCOUNTER — Inpatient Hospital Stay: Payer: BC Managed Care – PPO

## 2022-06-07 ENCOUNTER — Ambulatory Visit: Payer: BC Managed Care – PPO | Admitting: Hematology and Oncology

## 2022-06-07 ENCOUNTER — Encounter: Payer: Self-pay | Admitting: *Deleted

## 2022-06-07 DIAGNOSIS — Z171 Estrogen receptor negative status [ER-]: Secondary | ICD-10-CM

## 2022-06-08 ENCOUNTER — Encounter: Payer: Self-pay | Admitting: *Deleted

## 2022-06-08 ENCOUNTER — Other Ambulatory Visit: Payer: Self-pay

## 2022-06-08 ENCOUNTER — Inpatient Hospital Stay: Payer: BC Managed Care – PPO

## 2022-06-08 ENCOUNTER — Inpatient Hospital Stay: Payer: BC Managed Care – PPO | Attending: Genetic Counselor

## 2022-06-08 ENCOUNTER — Inpatient Hospital Stay: Payer: BC Managed Care – PPO | Admitting: Hematology and Oncology

## 2022-06-08 ENCOUNTER — Other Ambulatory Visit (HOSPITAL_COMMUNITY): Payer: Self-pay

## 2022-06-08 DIAGNOSIS — Z79899 Other long term (current) drug therapy: Secondary | ICD-10-CM | POA: Insufficient documentation

## 2022-06-08 DIAGNOSIS — D709 Neutropenia, unspecified: Secondary | ICD-10-CM | POA: Insufficient documentation

## 2022-06-08 DIAGNOSIS — C50211 Malignant neoplasm of upper-inner quadrant of right female breast: Secondary | ICD-10-CM | POA: Insufficient documentation

## 2022-06-08 DIAGNOSIS — Z171 Estrogen receptor negative status [ER-]: Secondary | ICD-10-CM | POA: Diagnosis not present

## 2022-06-08 LAB — CBC WITH DIFFERENTIAL (CANCER CENTER ONLY)
Abs Immature Granulocytes: 0.05 10*3/uL (ref 0.00–0.07)
Basophils Absolute: 0 10*3/uL (ref 0.0–0.1)
Basophils Relative: 0 %
Eosinophils Absolute: 0.2 10*3/uL (ref 0.0–0.5)
Eosinophils Relative: 4 %
HCT: 36.5 % (ref 36.0–46.0)
Hemoglobin: 12.6 g/dL (ref 12.0–15.0)
Immature Granulocytes: 1 %
Lymphocytes Relative: 13 %
Lymphs Abs: 0.9 10*3/uL (ref 0.7–4.0)
MCH: 29.4 pg (ref 26.0–34.0)
MCHC: 34.5 g/dL (ref 30.0–36.0)
MCV: 85.1 fL (ref 80.0–100.0)
Monocytes Absolute: 0.5 10*3/uL (ref 0.1–1.0)
Monocytes Relative: 7 %
Neutro Abs: 5.1 10*3/uL (ref 1.7–7.7)
Neutrophils Relative %: 75 %
Platelet Count: 245 10*3/uL (ref 150–400)
RBC: 4.29 MIL/uL (ref 3.87–5.11)
RDW: 15.8 % — ABNORMAL HIGH (ref 11.5–15.5)
WBC Count: 6.8 10*3/uL (ref 4.0–10.5)
nRBC: 0 % (ref 0.0–0.2)

## 2022-06-08 LAB — CMP (CANCER CENTER ONLY)
ALT: 24 U/L (ref 0–44)
AST: 19 U/L (ref 15–41)
Albumin: 4.2 g/dL (ref 3.5–5.0)
Alkaline Phosphatase: 88 U/L (ref 38–126)
Anion gap: 4 — ABNORMAL LOW (ref 5–15)
BUN: 14 mg/dL (ref 8–23)
CO2: 29 mmol/L (ref 22–32)
Calcium: 9.4 mg/dL (ref 8.9–10.3)
Chloride: 106 mmol/L (ref 98–111)
Creatinine: 0.96 mg/dL (ref 0.44–1.00)
GFR, Estimated: >60 mL/min (ref >60)
Glucose, Bld: 81 mg/dL (ref 70–99)
Potassium: 3.8 mmol/L (ref 3.5–5.1)
Sodium: 139 mmol/L (ref 135–145)
Total Bilirubin: 0.3 mg/dL (ref 0.3–1.2)
Total Protein: 7.5 g/dL (ref 6.5–8.1)

## 2022-06-08 LAB — TSH: TSH: 1.69 u[IU]/mL (ref 0.350–4.500)

## 2022-06-08 MED ORDER — AMLODIPINE-OLMESARTAN 5-40 MG PO TABS: 1.0000 | ORAL_TABLET | Freq: Every day | ORAL | 3 refills | Status: AC

## 2022-06-08 MED ORDER — AZOR 5-40 MG PO TABS
ORAL_TABLET | ORAL | 0 refills | Status: DC
  Filled 2022-06-08: qty 30, 30d supply, fill #0

## 2022-06-08 NOTE — Progress Notes (Signed)
Pt declined treatment today, MD aware, ambulatory to lobby upon discharge

## 2022-06-08 NOTE — Assessment & Plan Note (Signed)
status post right breast upper inner quadrant biopsy 06/03/2021 for a clinical T2N03.3 cminvasive ductal carcinoma, grade 3, functionally triple negative,ER 5%, PR 0%, HER2 2+ by IHC FISH negative, Ki-67 95%  Treatment plan: 1. Neoadjuvant chemotherapy consisting of carboplatin paclitaxel and pembrolizumab for 4 cycles starting 06/23/2021, to be followed by doxorubicin and cyclophosphamide and pembrolizumab for 4 cyclescompleted 12/09/2021, with pembrolizumab to be continued to complete a year. 2.right Lumpectomy: 0.5 cm grade 2 IDC 0/4 LN Neg, Margins Neg 3.Adjuvant radiation therapy 03/10/2022-04/06/2022 ------------------------------------------------------------------------------------------------------------------------------ Current treatment: Keytruda every 3 weeks Keytruda toxicities:  RTC every 3 weeks for [facility] and every 6 weeks for follow up with me

## 2022-06-08 NOTE — Progress Notes (Signed)
Patient Care Team: Marinda Elk as PCP - General (Internal Medicine) Rockwell Germany, RN as Oncology Nurse Navigator Mauro Kaufmann, RN as Oncology Nurse Navigator Stark Klein, MD as Consulting Physician (General Surgery) Nicholas Lose, MD as Consulting Physician (Hematology and Oncology)  DIAGNOSIS:  Encounter Diagnosis  Name Primary?   Malignant neoplasm of upper-inner quadrant of right breast in female, estrogen receptor negative ([place])     SUMMARY OF ONCOLOGIC HISTORY: Oncology History  Malignant neoplasm of upper-inner quadrant of right breast in female, estrogen receptor negative ([place])  06/03/2021 Initial Diagnosis   [place] woman status post right breast upper inner quadrant biopsy 06/03/2021 for a clinical T2N0 invasive ductal carcinoma, grade 3, functionally triple negative, with an MIB-1 of 95%.   06/09/2021 Cancer Staging   Staging form: Breast, AJCC 8th Edition - Clinical: Stage IIB (cT2, cN0, cM0, G3, ER-, PR-, HER2-) - Signed by Chauncey Cruel, MD on 06/09/2021 Histologic grading system: 3 grade system   06/23/2021 -  Neo-Adjuvant Chemotherapy   neoadjuvant chemotherapy consisting of carboplatin paclitaxel and pembrolizumab for 4 cycles starting 06/23/2021, to be followed by doxorubicin and cyclophosphamide and pembrolizumab for 4 cycles, with pembrolizumab to be continued to complete a year   06/30/2021 Genetic Testing   Patient has genetic testing done for personal history of breast cancer. No pathogenic variants were detected. Of note, a variant of uncertain significance was detected in the BRIP1 and TSC2 genes.  The genes analyzed include: APC, ATM, AXIN2, BARD1, BMPR1A, BRCA1, BRCA2, BRIP1, CDH1, CDK4*, CDKN2A (p14ARF)*, CDKN2A (p16INK4a)*, CHEK2, CTNNA1, DICER1, EPCAM (Deletion/duplication testing only), GREM1 (promoter region deletion/duplication testing only), KIT, MEN1, MLH1, MSH2, MSH3, MSH6, MUTYH, NBN, NF1, NHTL1, PALB2, PDGFRA*, PMS2,  POLD1, POLE, PTEN, RAD50, RAD51C, RAD51D, SDHB, SDHC, SDHD, SMAD4, SMARCA4. STK11, TP53, TSC1, TSC2, and VHL.  The following genes were evaluated for sequence changes only: SDHA and HOXB13 c.251G>A variant only.    11/04/2021 - 11/06/2021 Hospital Admission   Neutropenia related pneumonia   01/01/2022 -  Chemotherapy   Patient is on Treatment Plan : BREAST Pembrolizumab (200) q21d x 27 weeks     03/09/2022 - 04/06/2022 Radiation Therapy   Site Technique Total Dose (Gy) Dose per Fx (Gy) Completed Fx Beam Energies  Breast, Right: Breast_R 3D 42.56/42.56 2.66 16/16 6X, 10X  Breast, Right: Breast_R_Bst 3D 8/8 2 4/4 6X, 10X       CHIEF COMPLIANT: Follow-up on last cycle of Keytruda  INTERVAL HISTORY: Rita Davis is a  61 y.o. with above-mentioned history of triple negative breast cancer, currently on chemotherapy with Adriamycin, Cytoxan, Keytruda. She presents to the clinic today for a follow-up for last treatment. She states that  Everything is going well.  Since she has completed most of the therapy, she did not want to receive this final treatment because she and her daughter are celebrating the completion of treatment with concert in [place]:  has No Known Allergies.  MEDICATIONS:  Current Outpatient Medications  Medication Sig Dispense Refill   amLODipine-olmesartan (AZOR) 5-40 MG tablet Take 1 tablet by mouth daily. 90 tablet 3   Investigational palmitoylethanolamide/placebo 400 MG capsule [id number] Take 400 mg by mouth daily. Take with food and swallow whole. Do not crush or open capsule. Store at room temperature. Keep container tightly closed.     Misc Natural Products (SUPER GREENS) POWD Take 1 Scoop by mouth daily.     Multiple Vitamins-Minerals (MULTIVITAMIN WITH MINERALS) tablet Take 1 tablet by mouth daily. Micro  daily     rosuvastatin (CRESTOR) 20 MG tablet Take 1 tablet (20 mg total) by mouth daily.     No current facility-administered medications for this  visit.    PHYSICAL EXAMINATION: ECOG PERFORMANCE STATUS: 1 - Symptomatic but completely ambulatory  Vitals:   06/08/22 1157  BP: 126/79  Pulse: 80  Temp: (!) 97.2 F (36.2 C)  SpO2: 98%   Filed Weights   06/08/22 1157  Weight: 182 lb 8 oz (82.8 kg)      LABORATORY DATA:  I have reviewed the data as listed    Latest Ref Rng & Units 06/08/2022   11:51 AM 05/14/2022    8:16 AM 04/26/2022    2:03 PM  CMP  Glucose 70 - 99 mg/dL 81  122  115   BUN 8 - 23 mg/dL $Remove'14  15  17   'bcfAbpX$ Creatinine 0.44 - 1.00 mg/dL 0.96  0.99  1.02   Sodium 135 - 145 mmol/L 139  140  140   Potassium 3.5 - 5.1 mmol/L 3.8  3.7  3.8   Chloride 98 - 111 mmol/L 106  107  105   CO2 22 - 32 mmol/L $RemoveB'29  28  28   'dBOjzUQz$ Calcium 8.9 - 10.3 mg/dL 9.4  9.6  9.6   Total Protein 6.5 - 8.1 g/dL 7.5  7.3  7.4   Total Bilirubin 0.3 - 1.2 mg/dL 0.3  0.3  0.4   Alkaline Phos 38 - 126 U/L 88  94  85   AST 15 - 41 U/L $Remo'19  22  23   'LpRwu$ ALT 0 - 44 U/L $Remo'24  22  21     'qyFqH$ Lab Results  Component Value Date   WBC 6.8 06/08/2022   HGB 12.6 06/08/2022   HCT 36.5 06/08/2022   MCV 85.1 06/08/2022   PLT 245 06/08/2022   NEUTROABS 5.1 06/08/2022    ASSESSMENT & PLAN:  Malignant neoplasm of upper-inner quadrant of right breast in female, estrogen receptor negative ([place]) status post right breast upper inner quadrant biopsy 06/03/2021 for a clinical T2N0 3.3 cm invasive ductal carcinoma, grade 3, functionally triple negative, ER 5%, PR 0%, HER2 2+ by IHC FISH negative, Ki-67 95%    Treatment plan:  1. Neoadjuvant chemotherapy consisting of carboplatin paclitaxel and pembrolizumab for 4 cycles starting 06/23/2021, to be followed by doxorubicin and cyclophosphamide and pembrolizumab for 4 cycles completed 12/09/2021, with pembrolizumab to be continued to complete a year.   2. right Lumpectomy: 0.5 cm grade 2 IDC 0/4 LN Neg, Margins Neg 3.  Adjuvant radiation therapy  03/10/2022-04/06/2022 ------------------------------------------------------------------------------------------------------------------------------ Current treatment: Keytruda every 3 weeks Keytruda toxicities: Tolerating it extremely well without any problems. Today supposed to be her last treatment with [facility] but she does not want to receive the treatment today because she is going to attend a [employer] concert this weekend and wants to make sure that she is feeling well for that. Her port has been removed.   She has an appointment in 1 month for survivorship care plan visit.  After that we can see her in 6 months and after that annually.   No orders of the defined types were placed in this encounter.  The patient has a good understanding of the overall plan. she agrees with it. she will call with any problems that may develop before the next visit here. Total time spent: 30 mins including face to face time and time spent for planning, charting and co-ordination of care   [place]  K Jimesha Rising, MD 06/08/22    I Deritra, Mcnairy am scribing for Dr. Keylie Beavers  I have reviewed the above documentation for accuracy and completeness, and I agree with the above.   

## 2022-06-10 ENCOUNTER — Encounter: Payer: Self-pay | Admitting: Hematology and Oncology

## 2022-06-17 ENCOUNTER — Inpatient Hospital Stay: Payer: BC Managed Care – PPO | Admitting: Hematology and Oncology

## 2022-06-17 ENCOUNTER — Inpatient Hospital Stay: Payer: BC Managed Care – PPO

## 2022-07-06 ENCOUNTER — Inpatient Hospital Stay: Payer: BC Managed Care – PPO | Attending: Genetic Counselor | Admitting: Adult Health

## 2022-07-06 ENCOUNTER — Encounter: Payer: Self-pay | Admitting: Hematology and Oncology

## 2022-07-06 ENCOUNTER — Encounter: Payer: Self-pay | Admitting: Adult Health

## 2022-07-06 ENCOUNTER — Other Ambulatory Visit: Payer: Self-pay

## 2022-07-06 VITALS — BP 119/67 | HR 98 | Temp 98.1°F | Resp 16 | Ht 68.0 in | Wt 181.8 lb

## 2022-07-06 DIAGNOSIS — Z800 Family history of malignant neoplasm of digestive organs: Secondary | ICD-10-CM | POA: Diagnosis not present

## 2022-07-06 DIAGNOSIS — Z8249 Family history of ischemic heart disease and other diseases of the circulatory system: Secondary | ICD-10-CM | POA: Insufficient documentation

## 2022-07-06 DIAGNOSIS — D709 Neutropenia, unspecified: Secondary | ICD-10-CM | POA: Diagnosis not present

## 2022-07-06 DIAGNOSIS — R200 Anesthesia of skin: Secondary | ICD-10-CM | POA: Insufficient documentation

## 2022-07-06 DIAGNOSIS — Z803 Family history of malignant neoplasm of breast: Secondary | ICD-10-CM | POA: Diagnosis not present

## 2022-07-06 DIAGNOSIS — Z79899 Other long term (current) drug therapy: Secondary | ICD-10-CM | POA: Diagnosis not present

## 2022-07-06 DIAGNOSIS — G629 Polyneuropathy, unspecified: Secondary | ICD-10-CM | POA: Diagnosis not present

## 2022-07-06 DIAGNOSIS — Z171 Estrogen receptor negative status [ER-]: Secondary | ICD-10-CM | POA: Insufficient documentation

## 2022-07-06 DIAGNOSIS — Z8042 Family history of malignant neoplasm of prostate: Secondary | ICD-10-CM | POA: Insufficient documentation

## 2022-07-06 DIAGNOSIS — I890 Lymphedema, not elsewhere classified: Secondary | ICD-10-CM | POA: Diagnosis not present

## 2022-07-06 DIAGNOSIS — C50211 Malignant neoplasm of upper-inner quadrant of right female breast: Secondary | ICD-10-CM | POA: Insufficient documentation

## 2022-07-06 NOTE — Progress Notes (Signed)
SURVIVORSHIP VISIT:   BRIEF ONCOLOGIC HISTORY:  Oncology History  Malignant neoplasm of upper-inner quadrant of right breast in female, estrogen receptor negative ([place])  06/03/2021 Initial Diagnosis   Rita [place] woman status post right breast upper inner quadrant biopsy 06/03/2021 for a clinical T2N0 invasive ductal carcinoma, grade 3, functionally triple negative, with an MIB-1 of 95%.   06/09/2021 Cancer Staging   Staging form: Breast, AJCC 8th Edition - Clinical: Stage IIB (cT2, cN0, cM0, G3, ER-, PR-, HER2-) - Signed by Chauncey Cruel, MD on 06/09/2021 Histologic grading system: 3 grade system   06/23/2021 -  Neo-Adjuvant Chemotherapy   neoadjuvant chemotherapy consisting of carboplatin paclitaxel and pembrolizumab for 4 cycles starting 06/23/2021, to be followed by doxorubicin and cyclophosphamide and pembrolizumab for 4 cycles, with pembrolizumab to be continued to complete a year   06/30/2021 Genetic Testing   Patient has genetic testing done for personal history of breast cancer. No pathogenic variants were detected. Of note, a variant of uncertain significance was detected in the BRIP1 and TSC2 genes.  The genes analyzed include: APC, ATM, AXIN2, BARD1, BMPR1A, BRCA1, BRCA2, BRIP1, CDH1, CDK4*, CDKN2A (p14ARF)*, CDKN2A (p16INK4a)*, CHEK2, CTNNA1, DICER1, EPCAM (Deletion/duplication testing only), GREM1 (promoter region deletion/duplication testing only), KIT, MEN1, MLH1, MSH2, MSH3, MSH6, MUTYH, NBN, NF1, NHTL1, PALB2, PDGFRA*, PMS2, POLD1, POLE, PTEN, RAD50, RAD51C, RAD51D, SDHB, SDHC, SDHD, SMAD4, SMARCA4. STK11, TP53, TSC1, TSC2, and VHL.  The following genes were evaluated for sequence changes only: SDHA and HOXB13 c.251G>A variant only.    11/04/2021 - 11/06/2021 Hospital Admission   Neutropenia related pneumonia   12/21/2021 Surgery   Right Lumpectomy: 0.5 cm grade 2 IDC 0/4 LN Neg, Margins Neg   01/01/2022 -  Chemotherapy   Patient is on Treatment Plan : BREAST Pembrolizumab (200)  q21d x 27 weeks     03/09/2022 - 04/06/2022 Radiation Therapy   Site Technique Total Dose (Gy) Dose per Fx (Gy) Completed Fx Beam Energies  Breast, Right: Breast_R 3D 42.56/42.56 2.66 16/16 6X, 10X  Breast, Right: Breast_R_Bst 3D 8/8 2 4/4 6X, 10X       INTERVAL HISTORY:  Rita Davis to review her survivorship care plan detailing her treatment course for breast cancer, as well as monitoring long-term side effects of that treatment, education regarding health maintenance, screening, and overall wellness and health promotion.     Overall, Rita Davis reports feeling quite well.  The only mild residual issue she has experienced form chemotherapy is peripheral neuropathy.  She is exercising and has no concerns today.   REVIEW OF SYSTEMS:  Review of Systems  Constitutional:  Negative for appetite change, chills, fatigue, fever and unexpected weight change.  HENT:   Negative for hearing loss, lump/mass and trouble swallowing.   Eyes:  Negative for eye problems and icterus.  Respiratory:  Negative for chest tightness, cough and shortness of breath.   Cardiovascular:  Negative for chest pain, leg swelling and palpitations.  Gastrointestinal:  Negative for abdominal distention, abdominal pain, constipation, diarrhea, nausea and vomiting.  Endocrine: Negative for hot flashes.  Genitourinary:  Negative for difficulty urinating.   Musculoskeletal:  Negative for arthralgias.  Skin:  Negative for itching and rash.  Neurological:  Positive for numbness. Negative for dizziness, extremity weakness and headaches.  Hematological:  Negative for adenopathy. Does not bruise/bleed easily.  Psychiatric/Behavioral:  Negative for depression. The patient is not nervous/anxious.    Breast: Denies any new nodularity, masses, tenderness, nipple changes, or nipple discharge.      ONCOLOGY TREATMENT  TEAM:  1. Surgeon:  Dr. Barry Dienes at [facility] 2. Medical Oncologist: Dr. Lindi Adie  3. Radiation  Oncologist: Dr. Lisbeth Renshaw    PAST MEDICAL/SURGICAL HISTORY:  Past Medical History:  Diagnosis Date   Anemia    had a fibroid tumor, was anemic at that time   Anxiety    Cancer [facility])    right breast IDC   Family history of breast cancer    Family history of colon cancer    Family history of prostate cancer    Hyperlipidemia    Hypertension    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 ([place])   Pneumonia 11/2021   hospitalized   Pre-diabetes    Past Surgical History:  Procedure Laterality Date   ABDOMINAL HYSTERECTOMY     still has ovaries   BREAST BIOPSY Right 05/23/2012   BREAST CYST EXCISION Right    BREAST LUMPECTOMY WITH RADIOACTIVE SEED AND SENTINEL LYMPH NODE BIOPSY Right 12/21/2021   Procedure: RIGHT BREAST LUMPECTOMY WITH RADIOACTIVE SEED AND SENTINEL LYMPH NODE BIOPSY;  Surgeon: Stark Klein, MD;  Location: [place];  Service: General;  Laterality: Right;   cyst removed     from lower back   [place]     ORIF ANKLE FRACTURE Left 11/13/2020   Procedure: OPEN TREATMENT OF LEFT TRIMALLEOLAR ANKLE FRACTURE WITH POSTERIOR FIXATION, SYNDESMOSIS;  Surgeon: Erle Crocker, MD;  Location: [place];  Service: Orthopedics;  Laterality: Left;  LENGTH OF SURGERY: 1.5 HOURS   PORT-A-CATH REMOVAL N/A 06/03/2022   Procedure: REMOVAL PORT-A-CATH;  Surgeon: Stark Klein, MD;  Location: [facility];  Service: General;  Laterality: N/A;   PORTACATH PLACEMENT Left 06/18/2021   Procedure: PORT PLACEMENT;  Surgeon: Stark Klein, MD;  Location: [place];  Service: General;  Laterality: Left;     ALLERGIES:  No Known Allergies   CURRENT MEDICATIONS:  Outpatient Encounter Medications as of 07/06/2022  Medication Sig   amLODipine-olmesartan (AZOR) 5-40 MG tablet Take 1 tablet by mouth daily.   Investigational palmitoylethanolamide/placebo 400 MG capsule [id number] Take 400 mg by mouth daily. Take with food and swallow whole. Do not crush or open  capsule. Store at room temperature. Keep container tightly closed.   Misc Natural Products (SUPER GREENS) POWD Take 1 Scoop by mouth daily.   rosuvastatin (CRESTOR) 20 MG tablet Take 1 tablet (20 mg total) by mouth daily.   Multiple Vitamins-Minerals (MULTIVITAMIN WITH MINERALS) tablet Take 1 tablet by mouth daily. Micro daily (Patient not taking: Reported on 07/06/2022)   [DISCONTINUED] AZOR 5-40 MG tablet Take 1 tablet by mouth daily. (Patient not taking: Reported on 07/06/2022)   [DISCONTINUED] prochlorperazine (COMPAZINE) 10 MG tablet Take 1 tablet (10 mg total) by mouth every 6 (six) hours as needed (Nausea or vomiting).   No facility-administered encounter medications on file as of 07/06/2022.     ONCOLOGIC FAMILY HISTORY:  Family History  Problem Relation Age of Onset   Hypertension Mother    Breast cancer Mother 24       declined treatment   Prostate cancer Father        metastatic, dx 20s   Colon cancer Brother 50   Cancer Maternal Great-grandmother 41       gynecologic cancer (MGM's mother)      SOCIAL HISTORY:  Social History   Socioeconomic History   Marital status: Married    Spouse name: Not on file   Number of children: Not on file  Years of education: Not on file   Highest education level: Not on file  Occupational History   Not on file  Tobacco Use   Smoking status: Never   Smokeless tobacco: Never  Vaping Use   Vaping Use: Never used  Substance and Sexual Activity   Alcohol use: Yes    Comment: social   Drug use: No   Sexual activity: Not on file  Other Topics Concern   Not on file  Social History Narrative   Not on file   Social Determinants of Health   Financial Resource Strain: Not on file  Food Insecurity: Not on file  Transportation Needs: Not on file  Physical Activity: Not on file  Stress: Not on file  Social Connections: Not on file  Intimate Partner Violence: Not At Risk (11/19/2021)   Humiliation, Afraid, Rape, and Kick questionnaire     Fear of Current or Ex-Partner: No    Emotionally Abused: No    Physically Abused: No    Sexually Abused: No     OBSERVATIONS/OBJECTIVE:  BP 119/67 (BP Location: Left Arm, Patient Position: Sitting)   Pulse 98   Temp 98.1 F (36.7 C) (Oral)   Resp 16   Ht _0  (1.727 m)   Wt 181 lb 12.8 oz (82.5 kg)   SpO2 99%   BMI 27.64 kg/m  GENERAL: Patient is a well appearing female in no acute distress HEENT:  Sclerae anicteric.  Oropharynx clear and moist. No ulcerations or evidence of oropharyngeal candidiasis. Neck is supple.  NODES:  No cervical, supraclavicular, or axillary lymphadenopathy palpated.  BREAST EXAM:  right breast s/p lumpectomy and radiation, no sign of local recurrence--breast lymphedema present, left breast benign LUNGS:  Clear to auscultation bilaterally.  No wheezes or rhonchi. HEART:  Regular rate and rhythm. No murmur appreciated. ABDOMEN:  Soft, nontender.  Positive, normoactive bowel sounds. No organomegaly palpated. MSK:  No focal spinal tenderness to palpation. Full range of motion bilaterally in the upper extremities. EXTREMITIES:  No peripheral edema.   SKIN:  Clear with no obvious rashes or skin changes. No nail dyscrasia. NEURO:  Nonfocal. Well oriented.  Appropriate affect.  LABORATORY DATA:  None for this visit.  DIAGNOSTIC IMAGING:  None for this visit.      ASSESSMENT AND PLAN:  Ms.. Davis is a pleasant 61 y.o. female with Stage IIB right breast invasive ductal carcinoma, ER-/PR-/HER2-, diagnosed in 06/2021, treated with neoadjuvant chemotherapy, lumpectomy, adjuvant radiation therapy, and maintenance Pembrolizumab x 1 year.  She presents to the Survivorship Clinic for our initial meeting and routine follow-up post-completion of treatment for breast cancer.    1. Stage IIB right breast cancer:  Rita Davis is continuing to recover from definitive treatment for breast cancer. She will follow-up with her medical oncologist, Dr. Lindi Adie in 6  months with history and physical exam per surveillance protocol.  Her mammogram is due 10/2022; orders placed today. Today, a comprehensive survivorship care plan and treatment summary was reviewed with the patient today detailing her breast cancer diagnosis, treatment course, potential late/long-term effects of treatment, appropriate follow-up care with recommendations for the future, and patient education resources.  A copy of this summary, along with a letter will be sent to the patient's primary care provider via mail/fax/In Basket message after today's visit.    2. Breast lymphedema: Will refer to PT  3. Bone health:   She was given education on specific activities to promote bone health.  4. Cancer screening:  Due to  Rita Davis history and her age, she should receive screening for skin cancers, colon cancer, and gynecologic cancers.  The information and recommendations are listed on the patient's comprehensive care plan/treatment summary and were reviewed in detail with the patient.    5. Health maintenance and wellness promotion: Rita Davis was encouraged to consume 5-7 servings of fruits and vegetables per day. We reviewed the "Nutrition Rainbow" handout.  She was also encouraged to engage in moderate to vigorous exercise for 30 minutes per day most days of the week. We discussed the [employer] fitness program, which is designed for cancer survivors to help them become more physically fit after cancer treatments.  She was instructed to limit her alcohol consumption and continue to abstain from tobacco use.     6. Support services/counseling: It is not uncommon for this period of the patient's cancer care trajectory to be one of many emotions and stressors.  She was given information regarding our available services and encouraged to contact me with any questions or for help enrolling in any of our support group/programs.    Follow up instructions:    -Return to cancer center in 6  months for f/u with Dr. Lindi Adie  -Mammogram due in 10/2022 -Follow up with surgery 1 year -She is welcome to return back to the Survivorship Clinic at any time; no additional follow-up needed at this time.  -Consider referral back to survivorship as a long-term survivor for continued surveillance  The patient was provided an opportunity to ask questions and all were answered. The patient agreed with the plan and demonstrated an understanding of the instructions.   Total encounter time:40 minutes*in face-to-face visit time, chart review, lab review, care coordination, order entry, and documentation of the encounter time.    Wilber Bihari, NP 07/06/22 8:49 PM Medical Oncology and Hematology [employer] [place], [place] [address] Tel. [phone]    Fax. [phone]  *Total Encounter Time as defined by the Centers for Medicare and Medicaid Services includes, in addition to the face-to-face time of a patient visit (documented in the note above) non-face-to-face time: obtaining and reviewing outside history, ordering and reviewing medications, tests or procedures, care coordination (communications with other health care professionals or caregivers) and documentation in the medical record.

## 2022-07-07 ENCOUNTER — Ambulatory Visit: Payer: BC Managed Care – PPO | Attending: Adult Health

## 2022-07-07 ENCOUNTER — Telehealth: Payer: Self-pay | Admitting: Adult Health

## 2022-07-07 DIAGNOSIS — I890 Lymphedema, not elsewhere classified: Secondary | ICD-10-CM | POA: Diagnosis not present

## 2022-07-07 DIAGNOSIS — Z9221 Personal history of antineoplastic chemotherapy: Secondary | ICD-10-CM | POA: Diagnosis not present

## 2022-07-07 DIAGNOSIS — Z923 Personal history of irradiation: Secondary | ICD-10-CM | POA: Diagnosis not present

## 2022-07-07 DIAGNOSIS — Z483 Aftercare following surgery for neoplasm: Secondary | ICD-10-CM

## 2022-07-07 DIAGNOSIS — C50211 Malignant neoplasm of upper-inner quadrant of right female breast: Secondary | ICD-10-CM

## 2022-07-07 DIAGNOSIS — R600 Localized edema: Secondary | ICD-10-CM

## 2022-07-07 DIAGNOSIS — Z853 Personal history of malignant neoplasm of breast: Secondary | ICD-10-CM | POA: Insufficient documentation

## 2022-07-07 DIAGNOSIS — R293 Abnormal posture: Secondary | ICD-10-CM

## 2022-07-07 NOTE — Telephone Encounter (Signed)
Scheduled appointment per 9/5 los. Patient is aware.

## 2022-07-07 NOTE — Therapy (Signed)
OUTPATIENT PHYSICAL THERAPY ONCOLOGY EVALUATION  Patient Name: Rita Davis MRN: [id number] DOB:Mar 26, 1961, 62 y.o., female Today's Date: 07/07/2022   PT End of Session - 07/07/22 1013     Visit Number 1    Number of Visits 8    Date for PT Re-Evaluation 08/04/22    PT Start Time 1015   late   PT Stop Time 1055    PT Time Calculation (min) 40 min    Activity Tolerance Patient tolerated treatment well    Behavior During Therapy WFL for tasks assessed/performed             Past Medical History:  Diagnosis Date   Anemia    had a fibroid tumor, was anemic at that time   Anxiety    Cancer [facility])    right breast IDC   Family history of breast cancer    Family history of colon cancer    Family history of prostate cancer    Hyperlipidemia    Hypertension    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 ([place])   Pneumonia 11/2021   hospitalized   Port-A-Cath in place 06/24/2021   Pre-diabetes    Past Surgical History:  Procedure Laterality Date   ABDOMINAL HYSTERECTOMY     still has ovaries   BREAST BIOPSY Right 05/23/2012   BREAST CYST EXCISION Right    BREAST LUMPECTOMY WITH RADIOACTIVE SEED AND SENTINEL LYMPH NODE BIOPSY Right 12/21/2021   Procedure: RIGHT BREAST LUMPECTOMY WITH RADIOACTIVE SEED AND SENTINEL LYMPH NODE BIOPSY;  Surgeon: Stark Klein, MD;  Location: [place];  Service: General;  Laterality: Right;   cyst removed     from lower back   [place]     ORIF ANKLE FRACTURE Left 11/13/2020   Procedure: OPEN TREATMENT OF LEFT TRIMALLEOLAR ANKLE FRACTURE WITH POSTERIOR FIXATION, SYNDESMOSIS;  Surgeon: Erle Crocker, MD;  Location: [place];  Service: Orthopedics;  Laterality: Left;  LENGTH OF SURGERY: 1.5 HOURS   PORT-A-CATH REMOVAL N/A 06/03/2022   Procedure: REMOVAL PORT-A-CATH;  Surgeon: Stark Klein, MD;  Location: [place];  Service: General;  Laterality: N/A;   PORTACATH PLACEMENT Left 06/18/2021   Procedure:  PORT PLACEMENT;  Surgeon: Stark Klein, MD;  Location: [place];  Service: General;  Laterality: Left;   Patient Active Problem List   Diagnosis Date Noted   Encounter for antineoplastic immunotherapy 02/10/2022   Bilateral pneumonia 11/05/2021   Pneumonia of both lower lobes due to infectious organism 11/04/2021   Genetic testing 07/10/2021   Family history of breast cancer 06/09/2021   Family history of prostate cancer 06/09/2021   Family history of colon cancer 06/09/2021   Malignant neoplasm of upper-inner quadrant of right breast in female, estrogen receptor negative ([place]) 06/09/2021   Pneumonia 03/21/2018   Fibroid tumor 03/10/2018   Adjustment insomnia 11/26/2015   Eczema 11/26/2015   Other specified abnormal findings of blood chemistry 11/26/2015   Healthcare maintenance 04/10/2014   Anxiety state 02/28/2014   OVERWEIGHT 03/27/2009   Overweight 03/27/2009   HYPERLIPIDEMIA 01/24/2009   ANEMIA 11/20/2007   HYPERTENSION 11/20/2007    PCP: Liliane Bade  REFERRING PROVIDER: Wilber Bihari, NP  REFERRING DIAG: s/p Right breast cancer  THERAPY DIAG:  Malignant neoplasm of upper-inner quadrant of right breast in female, estrogen receptor negative ([place])  Aftercare following surgery for neoplasm  Abnormal posture  ONSET DATE: 12/2021  Rationale for Evaluation and Treatment Rehabilitation  SUBJECTIVE  SUBJECTIVE STATEMENT:  Soreness in the right breast and swelling that started after surgery and it feels heavier. Have been out of town and need a SOZO screen too  PERTINENT HISTORY:   Initial Diagnosis 06/03/21 of Rt breast cancer. Neo-adjuvant chemotherapy started 06/23/21 of Carboplatin/Paclitaxel/Keytruda followed by Cyclophosphamide and Keytruda. Pt Rt lumpectomy and SLNB 0/4 LN  positive on 12/21/21 with Dr. Barry Dienes, and then had radiation.    PAIN:  Are you having pain? No  PRECAUTIONS: Other: Right lymphedema risk  WEIGHT BEARING RESTRICTIONS No  FALLS:  Has patient fallen in last 6 months? No  LIVING ENVIRONMENT: Lives with: lives alone Lives in: House/apartment Stairs: No; Internal: 14 steps; on left going up and External: 2 steps; none Has following equipment at home: None  OCCUPATION: retired  Psychologist, clinical: travel, shop, read, walk  HAND DOMINANCE : right   PRIOR LEVEL OF FUNCTION: Independent  PATIENT GOALS Diminish swelling right breast   OBJECTIVE  COGNITION:  Overall cognitive status: Within functional limits for tasks assessed   PALPATION: Right breast noted slightly firmer from radiation  OBSERVATIONS / OTHER ASSESSMENTS: Generalized right breast swelling with enlarged pores noted and complaints of heaviness. Mild fibrosis noted at inferior breast    POSTURE: forward head  UPPER EXTREMITY AROM/PROM: WNL  CERVICAL AROM: All within normal limits:    UPPER EXTREMITY STRENGTH: WNL   LYMPHEDEMA ASSESSMENTS:   SURGERY TYPE/DATE: Right lumpectomy 12/21/2021  NUMBER OF LYMPH NODES REMOVED: 0/4  CHEMOTHERAPY: neoadjuvant  RADIATION:yes  HORMONE TREATMENT: no  INFECTIONS: No  LYMPHEDEMA ASSESSMENTS:   LANDMARK RIGHT  eval  10 cm proximal to olecranon process   Olecranon process   10 cm proximal to ulnar styloid process   Just proximal to ulnar styloid process   Across hand at thumb web space   At base of 2nd digit   (Blank rows = not tested)  LANDMARK LEFT  eval  10 cm proximal to olecranon process   Olecranon process   10 cm proximal to ulnar styloid process   Just proximal to ulnar styloid process   Across hand at thumb web space   At base of 2nd digit   (Blank rows = not tested)  L-DEX LYMPHEDEMA SCREENING:  The patient was assessed using the L-Dex machine today to produce a lymphedema index baseline  score. The patient will be reassessed on a regular basis (typically every 3 months) to obtain new L-Dex scores. If the score is > 6.5 points away from his/her baseline score indicating onset of subclinical lymphedema, it will be recommended to wear a compression garment for 4 weeks, 12 hours per day and then be reassessed. If the score continues to be > 6.5 points from baseline at reassessment, we will initiate lymphedema treatment. Assessing in this manner has a 95% rate of preventing clinically significant lymphedema. Score today 8.1 points above baseline  L-DEX FLOWSHEETS - 07/07/22 1200       L-DEX LYMPHEDEMA SCREENING   Measurement Type Unilateral    L-DEX MEASUREMENT EXTREMITY Upper Extremity    POSITION  Standing    DOMINANT SIDE Right    At Risk Side Right    BASELINE SCORE (UNILATERAL) -8.7    L-DEX SCORE (UNILATERAL) -0.6    VALUE CHANGE (UNILAT) 8.1              BREAST COMPLAINTS SURVEY: 31   TODAY'S TREATMENT  Educated pt to wear a compression bra as much as possible to decrease swelling and heaviness. Discussed role of  lymphatics and impt of treating. Discussed SOZO and instructing pt in MLD for breast and Right UE since screen is still in the yellow  PATIENT EDUCATION:  Education details: see above Person educated: Patient Education method: Explanation Education comprehension: verbalized understanding   HOME EXERCISE PROGRAM: Not given   ASSESSMENT:  CLINICAL IMPRESSION: Patient is a 61 y.o. female who was seen today for physical therapy evaluation and treatment for Right breast swelling and heaviness with enlarged pores and fibrosis noted at inferior breast. Pts Sozo screen remains in the yellow indicative of subclinical lymphedema. She will benefit from instruction in MLD for breast and to prevent right UE  swelling.  Pt will require a compression sleeve as she lost hers.  Will measure next visit   OBJECTIVE IMPAIRMENTS decreased knowledge of condition and  increased edema.   ACTIVITY LIMITATIONS  doing everything but with swelling  LIMITATIONS:  none  PERSONAL FACTORS 1-2 comorbidities: Right breast Cancer s/p radiation  are also affecting patient's functional outcome.   REHAB POTENTIAL: Excellent  CLINICAL DECISION MAKING: Stable/uncomplicated  EVALUATION COMPLEXITY: Low  GOALS: Goals reviewed with patient? Yes    LONG TERM GOALS: Target date: 08/04/2022    Pt will have decreased right breast heaviness/swelling by 50% Baseline:  Goal status: INITIAL  2.  Pt will be independent in self MLD to the right breast and right UE to reduce breast swelling and prevent right UE swelling Baseline:  Goal status: INITIAL  3.  Pts breast complaints questionairre will be reduced to no greater than 16 Baseline:  Goal status: INITIAL  4.  Pts. Next SOZO screen will return to green Zone Baseline:  Goal status: INITIAL    PLAN: PT FREQUENCY: 2x/week  PT DURATION: 4 weeks  PLANNED INTERVENTIONS: Therapeutic exercises, Therapeutic activity, Patient/Family education, Self Care, Orthotic/Fit training, Manual lymph drainage, scar mobilization, Vasopneumatic device, Manual therapy, and Re-evaluation  PLAN FOR NEXT SESSION: Measure circumferences both arms, measure arm for sleeve and see if Harmony fits or she will purchase a Juzo similar to what she had, start instructing breast and Right UE MLD (pt in the yellow for SOZO), Flexi?   Claris Pong, PT 07/07/2022, 12:24 PM

## 2022-07-08 ENCOUNTER — Ambulatory Visit: Payer: BC Managed Care – PPO

## 2022-07-08 DIAGNOSIS — C50211 Malignant neoplasm of upper-inner quadrant of right female breast: Secondary | ICD-10-CM

## 2022-07-08 DIAGNOSIS — Z853 Personal history of malignant neoplasm of breast: Secondary | ICD-10-CM | POA: Diagnosis not present

## 2022-07-08 DIAGNOSIS — R293 Abnormal posture: Secondary | ICD-10-CM

## 2022-07-08 DIAGNOSIS — Z483 Aftercare following surgery for neoplasm: Secondary | ICD-10-CM

## 2022-07-08 DIAGNOSIS — R600 Localized edema: Secondary | ICD-10-CM

## 2022-07-08 NOTE — Therapy (Signed)
OUTPATIENT PHYSICAL THERAPY ONCOLOGY TREATMENT  Patient Name: Rita Davis MRN: [id number] DOB:Feb 03, 1961, 61 y.o., female Today's Date: 07/08/2022   PT End of Session - 07/08/22 1402     Visit Number 2    Number of Visits 8    Date for PT Re-Evaluation 08/04/22    PT Start Time 1402    PT Stop Time 1503    PT Time Calculation (min) 61 min    Activity Tolerance Patient tolerated treatment well    Behavior During Therapy WFL for tasks assessed/performed             Past Medical History:  Diagnosis Date   Anemia    had a fibroid tumor, was anemic at that time   Anxiety    Cancer [facility])    right breast IDC   Family history of breast cancer    Family history of colon cancer    Family history of prostate cancer    Hyperlipidemia    Hypertension    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 ([place])   Pneumonia 11/2021   hospitalized   Port-A-Cath in place 06/24/2021   Pre-diabetes    Past Surgical History:  Procedure Laterality Date   ABDOMINAL HYSTERECTOMY     still has ovaries   BREAST BIOPSY Right 05/23/2012   BREAST CYST EXCISION Right    BREAST LUMPECTOMY WITH RADIOACTIVE SEED AND SENTINEL LYMPH NODE BIOPSY Right 12/21/2021   Procedure: RIGHT BREAST LUMPECTOMY WITH RADIOACTIVE SEED AND SENTINEL LYMPH NODE BIOPSY;  Surgeon: Stark Klein, MD;  Location: [place];  Service: General;  Laterality: Right;   cyst removed     from lower back   [place]     ORIF ANKLE FRACTURE Left 11/13/2020   Procedure: OPEN TREATMENT OF LEFT TRIMALLEOLAR ANKLE FRACTURE WITH POSTERIOR FIXATION, SYNDESMOSIS;  Surgeon: Erle Crocker, MD;  Location: [place];  Service: Orthopedics;  Laterality: Left;  LENGTH OF SURGERY: 1.5 HOURS   PORT-A-CATH REMOVAL N/A 06/03/2022   Procedure: REMOVAL PORT-A-CATH;  Surgeon: Stark Klein, MD;  Location: [place];  Service: General;  Laterality: N/A;   PORTACATH PLACEMENT Left 06/18/2021   Procedure: PORT  PLACEMENT;  Surgeon: Stark Klein, MD;  Location: [place];  Service: General;  Laterality: Left;   Patient Active Problem List   Diagnosis Date Noted   Encounter for antineoplastic immunotherapy 02/10/2022   Bilateral pneumonia 11/05/2021   Pneumonia of both lower lobes due to infectious organism 11/04/2021   Genetic testing 07/10/2021   Family history of breast cancer 06/09/2021   Family history of prostate cancer 06/09/2021   Family history of colon cancer 06/09/2021   Malignant neoplasm of upper-inner quadrant of right breast in female, estrogen receptor negative ([place]) 06/09/2021   Pneumonia 03/21/2018   Fibroid tumor 03/10/2018   Adjustment insomnia 11/26/2015   Eczema 11/26/2015   Other specified abnormal findings of blood chemistry 11/26/2015   Healthcare maintenance 04/10/2014   Anxiety state 02/28/2014   OVERWEIGHT 03/27/2009   Overweight 03/27/2009   HYPERLIPIDEMIA 01/24/2009   ANEMIA 11/20/2007   HYPERTENSION 11/20/2007    PCP: Liliane Bade  REFERRING PROVIDER: Wilber Bihari, NP  REFERRING DIAG: s/p Right breast cancer  THERAPY DIAG:  Malignant neoplasm of upper-inner quadrant of right breast in female, estrogen receptor negative ([place])  Aftercare following surgery for neoplasm  Abnormal posture  Localized edema  ONSET DATE: 12/2021  Rationale for Evaluation and Treatment Rehabilitation  SUBJECTIVE  SUBJECTIVE STATEMENT: I can't find my compression sleeve that you measured me for. I've made myself one with a compression sock but I need to be measured for another one. I was wearing it, especially when I flew, until I lost it.   PERTINENT HISTORY:   Initial Diagnosis 06/03/21 of Rt breast cancer. Neo-adjuvant chemotherapy started 06/23/21 of Carboplatin/Paclitaxel/Keytruda  followed by Cyclophosphamide and Keytruda. Pt Rt lumpectomy and SLNB 0/4 LN positive on 12/21/21 with Dr. Barry Dienes, and then had radiation.    PAIN:  Are you having pain? No  PRECAUTIONS: Other: Right lymphedema risk  WEIGHT BEARING RESTRICTIONS No  FALLS:  Has patient fallen in last 6 months? No  LIVING ENVIRONMENT: Lives with: lives alone Lives in: House/apartment Stairs: No; Internal: 14 steps; on left going up and External: 2 steps; none Has following equipment at home: None  OCCUPATION: retired  Psychologist, clinical: travel, shop, read, walk  HAND DOMINANCE : right   PRIOR LEVEL OF FUNCTION: Independent  PATIENT GOALS Diminish swelling right breast   OBJECTIVE  COGNITION:  Overall cognitive status: Within functional limits for tasks assessed   PALPATION: Right breast noted slightly firmer from radiation  OBSERVATIONS / OTHER ASSESSMENTS: Generalized right breast swelling with enlarged pores noted and complaints of heaviness. Mild fibrosis noted at inferior breast    POSTURE: forward head  UPPER EXTREMITY AROM/PROM: WNL  CERVICAL AROM: All within normal limits:    UPPER EXTREMITY STRENGTH: WNL   LYMPHEDEMA ASSESSMENTS:   SURGERY TYPE/DATE: Right lumpectomy 12/21/2021  NUMBER OF LYMPH NODES REMOVED: 0/4  CHEMOTHERAPY: neoadjuvant  RADIATION:yes  HORMONE TREATMENT: no  INFECTIONS: No  LYMPHEDEMA ASSESSMENTS:   LANDMARK RIGHT  07/08/22  10 cm proximal to olecranon process 28.8  Olecranon process 23  10 cm proximal to ulnar styloid process 22.4  Just proximal to ulnar styloid process 16  Across hand at thumb web space 20.4  At base of 2nd digit 6.6  (Blank rows = not tested)  LANDMARK LEFT  07/08/22  10 cm proximal to olecranon process 30.5  Olecranon process 24.5  10 cm proximal to ulnar styloid process 22.2  Just proximal to ulnar styloid process 15.5  Across hand at thumb web space 20.3  At base of 2nd digit 6.2  (Blank rows = not tested)  L-DEX  LYMPHEDEMA SCREENING:  The patient was assessed using the L-Dex machine today to produce a lymphedema index baseline score. The patient will be reassessed on a regular basis (typically every 3 months) to obtain new L-Dex scores. If the score is > 6.5 points away from his/her baseline score indicating onset of subclinical lymphedema, it will be recommended to wear a compression garment for 4 weeks, 12 hours per day and then be reassessed. If the score continues to be > 6.5 points from baseline at reassessment, we will initiate lymphedema treatment. Assessing in this manner has a 95% rate of preventing clinically significant lymphedema. Score today 8.1 points above baseline     BREAST COMPLAINTS SURVEY: 31   TODAY'S TREATMENT  07/08/22: Self Care Discussed pts current compression which is a compression sock she made as she has recently lost her compression sleeve she was measured for at our clinic. Pt called [facility] and they were able to get her in today after our appt. Also discussed compression bras. She reports she has these but has just stopped wearing them, so encouraged her to resume wearing them to help decrease the breast lymphedema she is experiencing. Also educated her about a  Flexitouch and she is interested in seeing if her insurance will cover this so, with her permission, will send her demographics to [place] today. Manual Therapy Manual Lymph Drainage: In Supine: Short neck, 5 diaphragmatic breaths, Lt axillary and Rt inguinal nodes, anterior inter-axillary and Rt axillo-inguinal anastomosis, then Rt breast and Rt UE working from proximal to distal then retracing all steps and beginning to instruct pt in this while performing along with basics of anatomy of lymphatic system answering her questions throughout.   07/07/22: Educated pt to wear a compression bra as much as possible to decrease swelling and heaviness. Discussed role of lymphatics and impt of treating. Discussed SOZO  and instructing pt in MLD for breast and Right UE since screen is still in the yellow  PATIENT EDUCATION:  Education details: see above Person educated: Patient Education method: Explanation Education comprehension: verbalized understanding   HOME EXERCISE PROGRAM: Not given   ASSESSMENT:  CLINICAL IMPRESSION: Circumference measurements taken to have baselines determined. No significant difference between UE's which is to be expected as pts SOZO is indicative of subclinical lymphedema at last session which is not a measurable difference. Pt was able to make an appt at [facility] as soon as she leaves here and she will resume wearing her compression bras. First session of Rt breast and UE (focused more time on breast) MLD beginning to instruct throughout while performing and answering her questions. Also educated her in basics of anatomy of lymphatic system along with sequencing and principles of MLD. Pt reports her breast heaviness feeling some improved by end of session. With pt permission sent her demographics to [place].   OBJECTIVE IMPAIRMENTS decreased knowledge of condition and increased edema.   ACTIVITY LIMITATIONS  doing everything but with swelling  LIMITATIONS:  none  PERSONAL FACTORS 1-2 comorbidities: Right breast Cancer s/p radiation  are also affecting patient's functional outcome.   REHAB POTENTIAL: Excellent  CLINICAL DECISION MAKING: Stable/uncomplicated  EVALUATION COMPLEXITY: Low  GOALS: Goals reviewed with patient? Yes    LONG TERM GOALS: Target date: 08/05/2022    Pt will have decreased right breast heaviness/swelling by 50% Baseline:  Goal status: INITIAL  2.  Pt will be independent in self MLD to the right breast and right UE to reduce breast swelling and prevent right UE swelling Baseline:  Goal status: INITIAL  3.  Pts breast complaints questionairre will be reduced to no greater than 16 Baseline:  Goal status: INITIAL  4.  Pts.  Next SOZO screen will return to green Zone Baseline:  Goal status: INITIAL    PLAN: PT FREQUENCY: 2x/week  PT DURATION: 4 weeks  PLANNED INTERVENTIONS: Therapeutic exercises, Therapeutic activity, Patient/Family education, Self Care, Orthotic/Fit training, Manual lymph drainage, scar mobilization, Vasopneumatic device, Manual therapy, and Re-evaluation  PLAN FOR NEXT SESSION: Cont breast and Right UE MLD and begin instructing pt in same and have her return demo issuing handout (pt in the yellow for SOZO), did she get a new compression sleeve? Wearing compression bra again? *Demo sent to [place] 07/08/22 - VR   Otelia Limes, PTA 07/08/2022, 3:25 PM

## 2022-07-12 ENCOUNTER — Ambulatory Visit: Payer: BC Managed Care – PPO | Admitting: Rehabilitation

## 2022-07-12 ENCOUNTER — Encounter: Payer: Self-pay | Admitting: Rehabilitation

## 2022-07-12 DIAGNOSIS — R600 Localized edema: Secondary | ICD-10-CM

## 2022-07-12 DIAGNOSIS — Z483 Aftercare following surgery for neoplasm: Secondary | ICD-10-CM

## 2022-07-12 DIAGNOSIS — Z171 Estrogen receptor negative status [ER-]: Secondary | ICD-10-CM

## 2022-07-12 DIAGNOSIS — R293 Abnormal posture: Secondary | ICD-10-CM

## 2022-07-12 NOTE — Therapy (Signed)
OUTPATIENT PHYSICAL THERAPY ONCOLOGY TREATMENT  Patient Name: Rita Davis MRN: [id number] DOB:Jan 18, 1961, 61 y.o., female Today's Date: 07/12/2022   PT End of Session - 07/12/22 1054     Visit Number 3    Number of Visits 8    Date for PT Re-Evaluation 08/04/22    PT Start Time 1100    PT Stop Time 9528    PT Time Calculation (min) 54 min    Activity Tolerance Patient tolerated treatment well    Behavior During Therapy WFL for tasks assessed/performed             Past Medical History:  Diagnosis Date   Anemia    had a fibroid tumor, was anemic at that time   Anxiety    Cancer [facility])    right breast IDC   Family history of breast cancer    Family history of colon cancer    Family history of prostate cancer    Hyperlipidemia    Hypertension    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 ([place])   Pneumonia 11/2021   hospitalized   Port-A-Cath in place 06/24/2021   Pre-diabetes    Past Surgical History:  Procedure Laterality Date   ABDOMINAL HYSTERECTOMY     still has ovaries   BREAST BIOPSY Right 05/23/2012   BREAST CYST EXCISION Right    BREAST LUMPECTOMY WITH RADIOACTIVE SEED AND SENTINEL LYMPH NODE BIOPSY Right 12/21/2021   Procedure: RIGHT BREAST LUMPECTOMY WITH RADIOACTIVE SEED AND SENTINEL LYMPH NODE BIOPSY;  Surgeon: Stark Klein, MD;  Location: [place];  Service: General;  Laterality: Right;   cyst removed     from lower back   [place]     ORIF ANKLE FRACTURE Left 11/13/2020   Procedure: OPEN TREATMENT OF LEFT TRIMALLEOLAR ANKLE FRACTURE WITH POSTERIOR FIXATION, SYNDESMOSIS;  Surgeon: Erle Crocker, MD;  Location: [place];  Service: Orthopedics;  Laterality: Left;  LENGTH OF SURGERY: 1.5 HOURS   PORT-A-CATH REMOVAL N/A 06/03/2022   Procedure: REMOVAL PORT-A-CATH;  Surgeon: Stark Klein, MD;  Location: [place];  Service: General;  Laterality: N/A;   PORTACATH PLACEMENT Left 06/18/2021   Procedure: PORT  PLACEMENT;  Surgeon: Stark Klein, MD;  Location: [place];  Service: General;  Laterality: Left;   Patient Active Problem List   Diagnosis Date Noted   Encounter for antineoplastic immunotherapy 02/10/2022   Bilateral pneumonia 11/05/2021   Pneumonia of both lower lobes due to infectious organism 11/04/2021   Genetic testing 07/10/2021   Family history of breast cancer 06/09/2021   Family history of prostate cancer 06/09/2021   Family history of colon cancer 06/09/2021   Malignant neoplasm of upper-inner quadrant of right breast in female, estrogen receptor negative ([place]) 06/09/2021   Pneumonia 03/21/2018   Fibroid tumor 03/10/2018   Adjustment insomnia 11/26/2015   Eczema 11/26/2015   Other specified abnormal findings of blood chemistry 11/26/2015   Healthcare maintenance 04/10/2014   Anxiety state 02/28/2014   OVERWEIGHT 03/27/2009   Overweight 03/27/2009   HYPERLIPIDEMIA 01/24/2009   ANEMIA 11/20/2007   HYPERTENSION 11/20/2007    PCP: Liliane Bade  REFERRING PROVIDER: Wilber Bihari, NP  REFERRING DIAG: s/p Right breast cancer  THERAPY DIAG:  Malignant neoplasm of upper-inner quadrant of right breast in female, estrogen receptor negative ([place])  Aftercare following surgery for neoplasm  Abnormal posture  Localized edema  ONSET DATE: 12/2021  Rationale for Evaluation and Treatment Rehabilitation  SUBJECTIVE  SUBJECTIVE STATEMENT: I got my new sleeves ordered and got a new one to wear.  (Pt comes in wearing 2 sleeves for extra compression - so education on wearing the 1 only.)   PERTINENT HISTORY:  Initial Diagnosis 06/03/21 of Rt breast cancer. Neo-adjuvant chemotherapy started 06/23/21 of Carboplatin/Paclitaxel/Keytruda followed by Cyclophosphamide and Keytruda. Pt Rt lumpectomy  and SLNB 0/4 LN positive on 12/21/21 with Dr. Barry Dienes, and then had radiation.    PAIN:  Are you having pain? No  PRECAUTIONS: Other: Right lymphedema risk  WEIGHT BEARING RESTRICTIONS No  FALLS:  Has patient fallen in last 6 months? No  LIVING ENVIRONMENT: Lives with: lives alone Lives in: House/apartment Stairs: No; Internal: 14 steps; on left going up and External: 2 steps; none Has following equipment at home: None  OCCUPATION: retired  Psychologist, clinical: travel, shop, read, walk  HAND DOMINANCE : right   PRIOR LEVEL OF FUNCTION: Independent  PATIENT GOALS Diminish swelling right breast   OBJECTIVE COGNITION:  Overall cognitive status: Within functional limits for tasks assessed   PALPATION: Right breast noted slightly firmer from radiation  OBSERVATIONS / OTHER ASSESSMENTS: Generalized right breast swelling with enlarged pores noted and complaints of heaviness. Mild fibrosis noted at inferior breast  POSTURE: forward head  UPPER EXTREMITY AROM/PROM: WNL  CERVICAL AROM: All within normal limits:   LYMPHEDEMA ASSESSMENTS:  SURGERY TYPE/DATE: Right lumpectomy 12/21/2021 NUMBER OF LYMPH NODES REMOVED: 0/4 CHEMOTHERAPY: neoadjuvant RADIATION:yes HORMONE TREATMENT: no INFECTIONS: No  LYMPHEDEMA ASSESSMENTS:   LANDMARK RIGHT  07/08/22  10 cm proximal to olecranon process 28.8  Olecranon process 23  10 cm proximal to ulnar styloid process 22.4  Just proximal to ulnar styloid process 16  Across hand at thumb web space 20.4  At base of 2nd digit 6.6  (Blank rows = not tested)  LANDMARK LEFT  07/08/22  10 cm proximal to olecranon process 30.5  Olecranon process 24.5  10 cm proximal to ulnar styloid process 22.2  Just proximal to ulnar styloid process 15.5  Across hand at thumb web space 20.3  At base of 2nd digit 6.2  (Blank rows = not tested)  L-DEX LYMPHEDEMA SCREENING: The patient was assessed using the L-Dex machine today to produce a lymphedema index baseline  score. The patient will be reassessed on a regular basis (typically every 3 months) to obtain new L-Dex scores. If the score is > 6.5 points away from his/her baseline score indicating onset of subclinical lymphedema, it will be recommended to wear a compression garment for 4 weeks, 12 hours per day and then be reassessed. If the score continues to be > 6.5 points from baseline at reassessment, we will initiate lymphedema treatment. Assessing in this manner has a 95% rate of preventing clinically significant lymphedema. Score today 8.1 points above baseline  BREAST COMPLAINTS SURVEY: 31  TODAY'S TREATMENT  07/12/22: Manual Therapy Manual Lymph Drainage: In Supine: Short neck, 5 diaphragmatic breaths, bil axillary and Rt inguinal nodes, anterior inter-axillary and Rt axillo-inguinal anastomosis, then Rt breast and Rt UE working from proximal to distal then retracing all steps and continuing to instruct pt in this while performing along with basics of anatomy of lymphatic system answering her questions throughout. Gave modified handout which UE steps per instruction section and then added breast in written words after step 5.  Instruction on how to do just breast or work through to the arm  Gave small chip pack for fibrosis  07/08/22: Self Care Discussed pts current compression which is a compression  sock she made as she has recently lost her compression sleeve she was measured for at our clinic. Pt called [facility] and they were able to get her in today after our appt. Also discussed compression bras. She reports she has these but has just stopped wearing them, so encouraged her to resume wearing them to help decrease the breast lymphedema she is experiencing. Also educated her about a Cytogeneticist and she is interested in seeing if her insurance will cover this so, with her permission, will send her demographics to [place] today. Manual Therapy Manual Lymph Drainage: In Supine: Short neck, 5  diaphragmatic breaths, Lt axillary and Rt inguinal nodes, anterior inter-axillary and Rt axillo-inguinal anastomosis, then Rt breast and Rt UE working from proximal to distal then retracing all steps and beginning to instruct pt in this while performing along with basics of anatomy of lymphatic system answering her questions throughout.   07/07/22: Educated pt to wear a compression bra as much as possible to decrease swelling and heaviness. Discussed role of lymphatics and impt of treating. Discussed SOZO and instructing pt in MLD for breast and Right UE since screen is still in the yellow  PATIENT EDUCATION:  Education details: see above Person educated: Patient Education method: Explanation Education comprehension: verbalized understanding   HOME EXERCISE PROGRAM: Self MLD Gave modified handout which UE steps per instruction section and then added breast in written words after step 5.  Instruction on how to do just breast or work through to the arm   ASSESSMENT: CLINICAL IMPRESSION: Continued UE and breast MLD and more instruction in self MLD but did not have pt perform full sequence.  Now has bra and sleeve with more sleeves ordered.  Fibrosis is mild to moderate with scar tissue noted more hard under the lumpectomy incision.   OBJECTIVE IMPAIRMENTS decreased knowledge of condition and increased edema.   ACTIVITY LIMITATIONS  doing everything but with swelling  LIMITATIONS:  none  PERSONAL FACTORS 1-2 comorbidities: Right breast Cancer s/p radiation  are also affecting patient's functional outcome.   REHAB POTENTIAL: Excellent  CLINICAL DECISION MAKING: Stable/uncomplicated  EVALUATION COMPLEXITY: Low  GOALS: Goals reviewed with patient? Yes    LONG TERM GOALS: Target date: 08/09/2022    Pt will have decreased right breast heaviness/swelling by 50% Baseline:  Goal status: INITIAL  2.  Pt will be independent in self MLD to the right breast and right UE to reduce breast  swelling and prevent right UE swelling Baseline:  Goal status: INITIAL  3.  Pts breast complaints questionairre will be reduced to no greater than 16 Baseline:  Goal status: INITIAL  4.  Pts. Next SOZO screen will return to green Zone Baseline:  Goal status: INITIAL    PLAN: PT FREQUENCY: 2x/week  PT DURATION: 4 weeks  PLANNED INTERVENTIONS: Therapeutic exercises, Therapeutic activity, Patient/Family education, Self Care, Orthotic/Fit training, Manual lymph drainage, scar mobilization, Vasopneumatic device, Manual therapy, and Re-evaluation  PLAN FOR NEXT SESSION: Cont breast and Right UE MLD and cont instructing pt in same and have her return demo (pt in the yellow for SOZO),  *Demo sent to [place] 07/08/22 - VR   Zamire Whitehurst, Adrian Prince, PT 07/12/2022, 11:57 AM

## 2022-07-12 NOTE — Patient Instructions (Signed)
Deep Effective Breath   1.)Standing, sitting, or laying down, place both hands on the belly. Take a deep breath IN, expanding the belly; then breath OUT, contracting the belly. Repeat __5__ times.    Axilla to Axilla - Sweep   2.) On uninvolved side make 5 circles in both armpits  3.) pump _5__ times from involved armpit across chest to uninvolved armpit, making a pathway.  Axilla to Inguinal Nodes - Sweep   4.) On involved side, make 5 circles at groin at panty line  5.)  Pump _5__ times from armpit along side of trunk to outer hip, making your other pathway.      6.) Pump _5__ times from back of elbow to top of shoulder.   7.) inner to outer upper arm _5_ times, then outer arm again _5_ times. Then back to the pathways _2-3_ times.     8.) Pump or stationary circles _5__ times from wrist to elbow making sure to do both sides of the forearm. Then retrace your steps to the outer arm, and the pathways _2-3_ times each.     9.) Pump or stationary circles _5__ times on back of hand including knuckle spaces and individual fingers if needed working up towards the wrist, then retrace all your steps working back up the forearm, doing both sides; upper outer arm and back to your pathways _2-3_ times each. Then do 5 circles again at uninvolved armpit and involved groin where you started! Good job!!

## 2022-07-13 ENCOUNTER — Ambulatory Visit: Payer: Self-pay | Admitting: Physical Therapy

## 2022-07-15 ENCOUNTER — Ambulatory Visit: Payer: BC Managed Care – PPO | Admitting: Rehabilitation

## 2022-07-15 ENCOUNTER — Encounter: Payer: Self-pay | Admitting: Rehabilitation

## 2022-07-15 DIAGNOSIS — Z853 Personal history of malignant neoplasm of breast: Secondary | ICD-10-CM | POA: Diagnosis not present

## 2022-07-15 DIAGNOSIS — Z483 Aftercare following surgery for neoplasm: Secondary | ICD-10-CM

## 2022-07-15 DIAGNOSIS — R293 Abnormal posture: Secondary | ICD-10-CM

## 2022-07-15 DIAGNOSIS — Z171 Estrogen receptor negative status [ER-]: Secondary | ICD-10-CM

## 2022-07-15 DIAGNOSIS — R600 Localized edema: Secondary | ICD-10-CM
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
## 2022-07-20 ENCOUNTER — Ambulatory Visit: Payer: BC Managed Care – PPO

## 2022-07-20 DIAGNOSIS — Z483 Aftercare following surgery for neoplasm: Secondary | ICD-10-CM

## 2022-07-20 DIAGNOSIS — C50211 Malignant neoplasm of upper-inner quadrant of right female breast: Secondary | ICD-10-CM

## 2022-07-20 DIAGNOSIS — R293 Abnormal posture: Secondary | ICD-10-CM

## 2022-07-20 DIAGNOSIS — R600 Localized edema: Secondary | ICD-10-CM

## 2022-07-20 DIAGNOSIS — Z853 Personal history of malignant neoplasm of breast: Secondary | ICD-10-CM | POA: Diagnosis not present

## 2022-07-20 NOTE — Therapy (Signed)
OUTPATIENT PHYSICAL THERAPY ONCOLOGY TREATMENT  Patient Name: Rita Davis MRN: [id number] DOB:[birth year], 61 y.o., female Today's Date: 07/20/2022   PT End of Session - 07/20/22 1402     Visit Number 5    Number of Visits 8    Date for PT Re-Evaluation 08/04/22    PT Start Time 1404    PT Stop Time 1451    PT Time Calculation (min) 47 min    Activity Tolerance Patient tolerated treatment well    Behavior During Therapy [facility] for tasks assessed/performed              Past Medical History:  Diagnosis Date   Anemia    had a fibroid tumor, was anemic at that time   Anxiety    Cancer [facility])    right breast IDC   Family history of breast cancer    Family history of colon cancer    Family history of prostate cancer    Hyperlipidemia    Hypertension    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     ORIF ANKLE FRACTURE Left 11/13/2020   Procedure: OPEN TREATMENT OF LEFT TRIMALLEOLAR ANKLE FRACTURE WITH POSTERIOR FIXATION, SYNDESMOSIS;  Surgeon: Erle Crocker, MD;  Location: [place];  Service: Orthopedics;  Laterality: Left;  LENGTH OF SURGERY: 1.5 HOURS   PORT-A-CATH REMOVAL N/A 06/03/2022   Procedure: REMOVAL PORT-A-CATH;  Surgeon: Stark Klein, MD;  Location: [place];  Service: General;  Laterality: N/A;   PORTACATH PLACEMENT Left 06/18/2021   Procedure:  PORT PLACEMENT;  Surgeon: Stark Klein, MD;  Location: [place];  Service: General;  Laterality: Left;   Patient Active Problem List   Diagnosis Date Noted   Encounter for antineoplastic immunotherapy 02/10/2022   Bilateral pneumonia 11/05/2021   Pneumonia of both lower lobes due to infectious organism 11/04/2021   Genetic testing 07/10/2021   Family history of breast cancer 06/09/2021   Family history of prostate cancer 06/09/2021   Family history of colon cancer 06/09/2021   Malignant neoplasm of upper-inner quadrant of right breast in female, estrogen receptor negative ([place]) 06/09/2021   Pneumonia 03/21/2018   Fibroid tumor 03/10/2018   Adjustment insomnia 11/26/2015   Eczema 11/26/2015   Other specified abnormal findings of blood chemistry 11/26/2015   Healthcare maintenance 04/10/2014   Anxiety state 02/28/2014   OVERWEIGHT 03/27/2009   Overweight 03/27/2009   HYPERLIPIDEMIA 01/24/2009   ANEMIA 11/20/2007   HYPERTENSION 11/20/2007    PCP: Liliane Bade  REFERRING PROVIDER: Wilber Bihari, NP  REFERRING DIAG: s/p Right breast cancer  THERAPY DIAG:  Malignant neoplasm of upper-inner quadrant of right breast in female, estrogen receptor negative ([place])  Aftercare following surgery for neoplasm  Abnormal posture  Localized edema  ONSET DATE: 12/2021  Rationale for Evaluation and Treatment Rehabilitation  SUBJECTIVE  SUBJECTIVE STATEMENT: Swelling seems to be better, and the doctor noticed it was better. I have been doing the MLD on my arm and breast. The sleeve is comfortable. I have the Flexitouch trial on Thursday?  PERTINENT HISTORY:  Initial Diagnosis 06/03/21 of Rt breast cancer. Neo-adjuvant chemotherapy started 06/23/21 of Carboplatin/Paclitaxel/Keytruda followed by  Cyclophosphamide and Keytruda. Pt Rt lumpectomy and SLNB 0/4 LN positive on 12/21/21 with Dr. Barry Dienes, and then had radiation.    PAIN:  Are you having pain? No  PRECAUTIONS: Other: Right lymphedema risk  WEIGHT BEARING RESTRICTIONS No  FALLS:  Has patient fallen in last 6 months? No  LIVING ENVIRONMENT: Lives with: lives alone Lives in: House/apartment Stairs: No; Internal: 14 steps; on left going up and External: 2 steps; none Has following equipment at home: None  OCCUPATION: retired  Psychologist, clinical: travel, shop, read, walk  HAND DOMINANCE : right   PRIOR LEVEL OF FUNCTION: Independent  PATIENT GOALS Diminish swelling right breast   OBJECTIVE COGNITION:  Overall cognitive status: Within functional limits for tasks assessed   PALPATION: Right breast noted slightly firmer from radiation  OBSERVATIONS / OTHER ASSESSMENTS: Generalized right breast swelling with enlarged pores noted and complaints of heaviness. Mild fibrosis noted at inferior breast  POSTURE: forward head  UPPER EXTREMITY AROM/PROM: WNL  CERVICAL AROM: All within normal limits:   LYMPHEDEMA ASSESSMENTS:  SURGERY TYPE/DATE: Right lumpectomy 12/21/2021 NUMBER OF LYMPH NODES REMOVED: 0/4 CHEMOTHERAPY: neoadjuvant RADIATION:yes HORMONE TREATMENT: no INFECTIONS: No  LYMPHEDEMA ASSESSMENTS:   LANDMARK RIGHT  07/08/22  10 cm proximal to olecranon process 28.8  Olecranon process 23  10 cm proximal to ulnar styloid process 22.4  Just proximal to ulnar styloid process 16  Across hand at thumb web space 20.4  At base of 2nd digit 6.6  (Blank rows = not tested)  LANDMARK LEFT  07/08/22  10 cm proximal to olecranon process 30.5  Olecranon process 24.5  10 cm proximal to ulnar styloid process 22.2  Just proximal to ulnar styloid process 15.5  Across hand at thumb web space 20.3  At base of 2nd digit 6.2  (Blank rows = not tested)  L-DEX LYMPHEDEMA SCREENING: The patient was assessed using the L-Dex  machine today to produce a lymphedema index baseline score. The patient will be reassessed on a regular basis (typically every 3 months) to obtain new L-Dex scores. If the score is > 6.5 points away from his/her baseline score indicating onset of subclinical lymphedema, it will be recommended to wear a compression garment for 4 weeks, 12 hours per day and then be reassessed. If the score continues to be > 6.5 points from baseline at reassessment, we will initiate lymphedema treatment. Assessing in this manner has a 95% rate of preventing clinically significant lymphedema. Score today 8.1 points above baseline  BREAST COMPLAINTS SURVEY: 31  TODAY'S TREATMENT   07/20/2022 Soft tissue mobilization to right UT, pectorals and lateral trunk with cocoa butter Manual Lymph Drainage: In Supine: Short neck, 5 diaphragmatic breaths, bil axillary and Rt inguinal nodes, anterior inter-axillary and Rt axillo-inguinal anastomosis, then Rt breast and Rt UE working from proximal to distal then retracing all steps and continuing to instruct pt in this while performing Another chip pack made for under right breast at her request and placed in small TG soft. Also gave pt a handout for flexitouch 07/15/22 Manual Therapy Manual Lymph Drainage: In Supine: Short neck, 5 diaphragmatic breaths, bil axillary and Rt inguinal nodes, anterior inter-axillary and Rt axillo-inguinal anastomosis, then  Rt breast and Rt UE working from proximal to distal then retracing all steps and continuing to instruct pt in this while performing along with basics of anatomy of lymphatic system answering her questions throughout.  STM with PROM to the Lt axilla   07/12/22: Manual Therapy Manual Lymph Drainage: In Supine: Short neck, 5 diaphragmatic breaths, bil axillary and Rt inguinal nodes, anterior inter-axillary and Rt axillo-inguinal anastomosis, then Rt breast and Rt UE working from proximal to distal then retracing all steps and continuing to  instruct pt in this while performing along with basics of anatomy of lymphatic system answering her questions throughout. Gave modified handout which UE steps per instruction section and then added breast in written words after step 5.  Instruction on how to do just breast or work through to the arm  Gave small chip pack for fibrosis  07/08/22: Self Care Discussed pts current compression which is a compression sock she made as she has recently lost her compression sleeve she was measured for at our clinic. Pt called [facility] and they were able to get her in today after our appt. Also discussed compression bras. She reports she has these but has just stopped wearing them, so encouraged her to resume wearing them to help decrease the breast lymphedema she is experiencing. Also educated her about a Cytogeneticist and she is interested in seeing if her insurance will cover this so, with her permission, will send her demographics to [place] today. Manual Therapy Manual Lymph Drainage: In Supine: Short neck, 5 diaphragmatic breaths, Lt axillary and Rt inguinal nodes, anterior inter-axillary and Rt axillo-inguinal anastomosis, then Rt breast and Rt UE working from proximal to distal then retracing all steps and beginning to instruct pt in this while performing along with basics of anatomy of lymphatic system answering her questions throughout.   PATIENT EDUCATION:  Education details: see above Person educated: Patient Education method: Explanation Education comprehension: verbalized understanding  HOME EXERCISE PROGRAM: Self MLD Gave modified handout which UE steps per instruction section and then added breast in written words after step 5.  Instruction on how to do just breast or work through to the arm   ASSESSMENT: CLINICAL IMPRESSION: Continued UE and breast MLD  and reviewed LN and pathway activation with pt. She needs further review on techniques. Enlarged pores remain, however swelling is  improving. Pt is compliant with compression bra and sleeve. OBJECTIVE IMPAIRMENTS decreased knowledge of condition and increased edema.   ACTIVITY LIMITATIONS  doing everything but with swelling  LIMITATIONS:  none  PERSONAL FACTORS 1-2 comorbidities: Right breast Cancer s/p radiation  are also affecting patient's functional outcome.   REHAB POTENTIAL: Excellent  CLINICAL DECISION MAKING: Stable/uncomplicated  EVALUATION COMPLEXITY: Low  GOALS: Goals reviewed with patient? Yes    LONG TERM GOALS: Target date: 08/17/2022    Pt will have decreased right breast heaviness/swelling by 50% Baseline:  Goal status: INITIAL  2.  Pt will be independent in self MLD to the right breast and right UE to reduce breast swelling and prevent right UE swelling Baseline:  Goal status: INITIAL  3.  Pts breast complaints questionairre will be reduced to no greater than 16 Baseline:  Goal status: INITIAL  4.  Pts. Next SOZO screen will return to green Zone Baseline:  Goal status: INITIAL    PLAN: PT FREQUENCY: 2x/week  PT DURATION: 4 weeks  PLANNED INTERVENTIONS: Therapeutic exercises, Therapeutic activity, Patient/Family education, Self Care, Orthotic/Fit training, Manual lymph drainage, scar mobilization, Vasopneumatic device,  Manual therapy, and Re-evaluation  PLAN FOR NEXT SESSION: Cont breast and Right UE MLD and cont instructing pt in same and have her return demo (pt in the yellow for SOZO),  *Demo sent to [place] 07/08/22 - VR   Claris Pong, PT 07/20/2022, 2:56 PM

## 2022-07-20 NOTE — Progress Notes (Signed)
S/w Malika at [place]. Rx for compression sleeve size 2 sent per MD. Fax confirmation received.

## 2022-07-22 ENCOUNTER — Encounter: Payer: Self-pay | Admitting: Rehabilitation

## 2022-07-22 ENCOUNTER — Encounter: Payer: BC Managed Care – PPO | Admitting: Rehabilitation

## 2022-07-22 ENCOUNTER — Ambulatory Visit: Payer: BC Managed Care – PPO | Admitting: Rehabilitation

## 2022-07-22 DIAGNOSIS — R600 Localized edema: Secondary | ICD-10-CM

## 2022-07-22 DIAGNOSIS — C50211 Malignant neoplasm of upper-inner quadrant of right female breast: Secondary | ICD-10-CM

## 2022-07-22 DIAGNOSIS — Z483 Aftercare following surgery for neoplasm: Secondary | ICD-10-CM

## 2022-07-22 DIAGNOSIS — Z853 Personal history of malignant neoplasm of breast: Secondary | ICD-10-CM | POA: Diagnosis not present

## 2022-07-22 DIAGNOSIS — R293 Abnormal posture: Secondary | ICD-10-CM

## 2022-07-22 NOTE — Therapy (Signed)
OUTPATIENT PHYSICAL THERAPY ONCOLOGY TREATMENT  Patient Name: Rita Davis MRN: [id number] DOB:01-09-1961, 61 y.o., female Today's Date: 07/22/2022   PT End of Session - 07/22/22 1457     Visit Number 6    Number of Visits 8    Date for PT Re-Evaluation 08/04/22    PT Start Time 1500    PT Stop Time 1545    PT Time Calculation (min) 45 min    Activity Tolerance Patient tolerated treatment well    Behavior During Therapy WFL for tasks assessed/performed              Past Medical History:  Diagnosis Date   Anemia    had a fibroid tumor, was anemic at that time   Anxiety    Cancer [facility])    right breast IDC   Family history of breast cancer    Family history of colon cancer    Family history of prostate cancer    Hyperlipidemia    Hypertension    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 ([place])   Pneumonia 11/2021   hospitalized   Port-A-Cath in place 06/24/2021   Pre-diabetes    Past Surgical History:  Procedure Laterality Date   ABDOMINAL HYSTERECTOMY     still has ovaries   BREAST BIOPSY Right 05/23/2012   BREAST CYST EXCISION Right    BREAST LUMPECTOMY WITH RADIOACTIVE SEED AND SENTINEL LYMPH NODE BIOPSY Right 12/21/2021   Procedure: RIGHT BREAST LUMPECTOMY WITH RADIOACTIVE SEED AND SENTINEL LYMPH NODE BIOPSY;  Surgeon: Stark Klein, MD;  Location: [place];  Service: General;  Laterality: Right;   cyst removed     from lower back   [place]     ORIF ANKLE FRACTURE Left 11/13/2020   Procedure: OPEN TREATMENT OF LEFT TRIMALLEOLAR ANKLE FRACTURE WITH POSTERIOR FIXATION, SYNDESMOSIS;  Surgeon: Erle Crocker, MD;  Location: [place];  Service: Orthopedics;  Laterality: Left;  LENGTH OF SURGERY: 1.5 HOURS   PORT-A-CATH REMOVAL N/A 06/03/2022   Procedure: REMOVAL PORT-A-CATH;  Surgeon: Stark Klein, MD;  Location: [place];  Service: General;  Laterality: N/A;   PORTACATH PLACEMENT Left 06/18/2021   Procedure:  PORT PLACEMENT;  Surgeon: Stark Klein, MD;  Location: [place];  Service: General;  Laterality: Left;   Patient Active Problem List   Diagnosis Date Noted   Encounter for antineoplastic immunotherapy 02/10/2022   Bilateral pneumonia 11/05/2021   Pneumonia of both lower lobes due to infectious organism 11/04/2021   Genetic testing 07/10/2021   Family history of breast cancer 06/09/2021   Family history of prostate cancer 06/09/2021   Family history of colon cancer 06/09/2021   Malignant neoplasm of upper-inner quadrant of right breast in female, estrogen receptor negative ([place]) 06/09/2021   Pneumonia 03/21/2018   Fibroid tumor 03/10/2018   Adjustment insomnia 11/26/2015   Eczema 11/26/2015   Other specified abnormal findings of blood chemistry 11/26/2015   Healthcare maintenance 04/10/2014   Anxiety state 02/28/2014   OVERWEIGHT 03/27/2009   Overweight 03/27/2009   HYPERLIPIDEMIA 01/24/2009   ANEMIA 11/20/2007   HYPERTENSION 11/20/2007    PCP: Liliane Bade  REFERRING PROVIDER: Wilber Bihari, NP  REFERRING DIAG: s/p Right breast cancer  THERAPY DIAG:  Malignant neoplasm of upper-inner quadrant of right breast in female, estrogen receptor negative ([place])  Aftercare following surgery for neoplasm  Abnormal posture  Localized edema  ONSET DATE: 12/2021  Rationale for Evaluation and Treatment Rehabilitation  SUBJECTIVE  SUBJECTIVE STATEMENT: I had the pump trial today.  They said they had the wrong size.  They are going to see if that will work or if I need a new one.    PERTINENT HISTORY:  Initial Diagnosis 06/03/21 of Rt breast cancer. Neo-adjuvant chemotherapy started 06/23/21 of Carboplatin/Paclitaxel/Keytruda followed by Cyclophosphamide and Keytruda. Pt Rt lumpectomy and SLNB 0/4 LN  positive on 12/21/21 with Dr. Barry Dienes, and then had radiation.    PAIN:  Are you having pain? No  PRECAUTIONS: Other: Right lymphedema risk  WEIGHT BEARING RESTRICTIONS No  FALLS:  Has patient fallen in last 6 months? No  LIVING ENVIRONMENT: Lives with: lives alone Lives in: House/apartment Stairs: No; Internal: 14 steps; on left going up and External: 2 steps; none Has following equipment at home: None  OCCUPATION: retired  Psychologist, clinical: travel, shop, read, walk  HAND DOMINANCE : right   PRIOR LEVEL OF FUNCTION: Independent  PATIENT GOALS Diminish swelling right breast   OBJECTIVE COGNITION:  Overall cognitive status: Within functional limits for tasks assessed   PALPATION: Right breast noted slightly firmer from radiation  OBSERVATIONS / OTHER ASSESSMENTS: Generalized right breast swelling with enlarged pores noted and complaints of heaviness. Mild fibrosis noted at inferior breast  POSTURE: forward head  UPPER EXTREMITY AROM/PROM: WNL  CERVICAL AROM: All within normal limits:   LYMPHEDEMA ASSESSMENTS:  SURGERY TYPE/DATE: Right lumpectomy 12/21/2021 NUMBER OF LYMPH NODES REMOVED: 0/4 CHEMOTHERAPY: neoadjuvant RADIATION:yes HORMONE TREATMENT: no INFECTIONS: No  LYMPHEDEMA ASSESSMENTS:   LANDMARK RIGHT  07/08/22  10 cm proximal to olecranon process 28.8  Olecranon process 23  10 cm proximal to ulnar styloid process 22.4  Just proximal to ulnar styloid process 16  Across hand at thumb web space 20.4  At base of 2nd digit 6.6  (Blank rows = not tested)  LANDMARK LEFT  07/08/22  10 cm proximal to olecranon process 30.5  Olecranon process 24.5  10 cm proximal to ulnar styloid process 22.2  Just proximal to ulnar styloid process 15.5  Across hand at thumb web space 20.3  At base of 2nd digit 6.2  (Blank rows = not tested)  L-DEX LYMPHEDEMA SCREENING: The patient was assessed using the L-Dex machine today to produce a lymphedema index baseline score. The  patient will be reassessed on a regular basis (typically every 3 months) to obtain new L-Dex scores. If the score is > 6.5 points away from his/her baseline score indicating onset of subclinical lymphedema, it will be recommended to wear a compression garment for 4 weeks, 12 hours per day and then be reassessed. If the score continues to be > 6.5 points from baseline at reassessment, we will initiate lymphedema treatment. Assessing in this manner has a 95% rate of preventing clinically significant lymphedema. Score today 8.1 points above baseline  BREAST COMPLAINTS SURVEY: 31  TODAY'S TREATMENT  07/22/2022 Soft tissue mobilization to right UT, pectorals and lateral trunk with cocoa butter Manual Lymph Drainage: In Supine: Short neck, 5 diaphragmatic breaths, bil axillary and Rt inguinal nodes, anterior inter-axillary and Rt axillo-inguinal anastomosis, then Rt breast and Rt UE working from proximal to distal then retracing all steps and continuing to instruct pt in this while performing  07/20/2022 Soft tissue mobilization to right UT, pectorals and lateral trunk with cocoa butter Manual Lymph Drainage: In Supine: Short neck, 5 diaphragmatic breaths, bil axillary and Rt inguinal nodes, anterior inter-axillary and Rt axillo-inguinal anastomosis, then Rt breast and Rt UE working from proximal to distal then retracing all steps  and continuing to instruct pt in this while performing Another chip pack made for under right breast at her request and placed in small TG soft. Also gave pt a handout for flexitouch  07/15/22 Manual Therapy Manual Lymph Drainage: In Supine: Short neck, 5 diaphragmatic breaths, bil axillary and Rt inguinal nodes, anterior inter-axillary and Rt axillo-inguinal anastomosis, then Rt breast and Rt UE working from proximal to distal then retracing all steps and continuing to instruct pt in this while performing along with basics of anatomy of lymphatic system answering her questions  throughout.  STM with PROM to the Lt axilla   PATIENT EDUCATION:  Education details: see above Person educated: Patient Education method: Explanation Education comprehension: verbalized understanding  HOME EXERCISE PROGRAM: Self MLD Gave modified handout which UE steps per instruction section and then added breast in written words after step 5.  Instruction on how to do just breast or work through to the arm   ASSESSMENT: CLINICAL IMPRESSION: Continued UE and breast MLD with fibrosis and swelling much improved.  Pt is also happy with improvements.  Let her know that when she feel like she is ready to be done we can attempt DC or decrease visits. Pt will be flying to [place] 08/05/22.    OBJECTIVE IMPAIRMENTS decreased knowledge of condition and increased edema.   ACTIVITY LIMITATIONS  doing everything but with swelling  LIMITATIONS:  none  PERSONAL FACTORS 1-2 comorbidities: Right breast Cancer s/p radiation  are also affecting patient's functional outcome.   REHAB POTENTIAL: Excellent  CLINICAL DECISION MAKING: Stable/uncomplicated  EVALUATION COMPLEXITY: Low  GOALS: Goals reviewed with patient? Yes    LONG TERM GOALS: Target date: 08/19/2022    Pt will have decreased right breast heaviness/swelling by 50% Baseline:  Goal status: INITIAL  2.  Pt will be independent in self MLD to the right breast and right UE to reduce breast swelling and prevent right UE swelling Baseline:  Goal status: INITIAL  3.  Pts breast complaints questionairre will be reduced to no greater than 16 Baseline:  Goal status: INITIAL  4.  Pts. Next SOZO screen will return to green Zone Baseline:  Goal status: INITIAL    PLAN: PT FREQUENCY: 2x/week  PT DURATION: 4 weeks  PLANNED INTERVENTIONS: Therapeutic exercises, Therapeutic activity, Patient/Family education, Self Care, Orthotic/Fit training, Manual lymph drainage, scar mobilization, Vasopneumatic device, Manual therapy, and  Re-evaluation  PLAN FOR NEXT SESSION: Cont breast and Right UE MLD, repeat SOZO on last visit or around 10/4 (pt in the yellow for SOZO),  *Demo sent to [place] 07/08/22 - VR   Kanchan Gal, Adrian Prince, PT 07/22/2022, 3:44 PM

## 2022-07-26 ENCOUNTER — Encounter: Payer: Self-pay | Admitting: Rehabilitation

## 2022-07-26 ENCOUNTER — Ambulatory Visit: Payer: BC Managed Care – PPO | Admitting: Rehabilitation

## 2022-07-26 DIAGNOSIS — R293 Abnormal posture: Secondary | ICD-10-CM

## 2022-07-26 DIAGNOSIS — Z853 Personal history of malignant neoplasm of breast: Secondary | ICD-10-CM | POA: Diagnosis not present

## 2022-07-26 DIAGNOSIS — Z483 Aftercare following surgery for neoplasm: Secondary | ICD-10-CM

## 2022-07-26 DIAGNOSIS — R600 Localized edema: Secondary | ICD-10-CM

## 2022-07-26 DIAGNOSIS — C50211 Malignant neoplasm of upper-inner quadrant of right female breast: Secondary | ICD-10-CM

## 2022-07-26 NOTE — Therapy (Signed)
OUTPATIENT PHYSICAL THERAPY ONCOLOGY TREATMENT  Patient Name: Rita Davis MRN: [id number] DOB:[birth year], 61 y.o., female Today's Date: 07/26/2022   PT End of Session - 07/26/22 1046     Visit Number 7    Date for PT Re-Evaluation 08/04/22    PT Start Time 1001    PT Stop Time 1045    PT Time Calculation (min) 44 min    Activity Tolerance Patient tolerated treatment well    Behavior During Therapy WFL for tasks assessed/performed               Past Medical History:  Diagnosis Date   Anemia    had a fibroid tumor, was anemic at that time   Anxiety    Cancer [facility])    right breast IDC   Family history of breast cancer    Family history of colon cancer    Family history of prostate cancer    Hyperlipidemia    Hypertension    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 ([place])   Pneumonia 11/2021   hospitalized   Port-A-Cath in place 06/24/2021   Pre-diabetes    Past Surgical History:  Procedure Laterality Date   ABDOMINAL HYSTERECTOMY     still has ovaries   BREAST BIOPSY Right 05/23/2012   BREAST CYST EXCISION Right    BREAST LUMPECTOMY WITH RADIOACTIVE SEED AND SENTINEL LYMPH NODE BIOPSY Right 12/21/2021   Procedure: RIGHT BREAST LUMPECTOMY WITH RADIOACTIVE SEED AND SENTINEL LYMPH NODE BIOPSY;  Surgeon: Stark Klein, MD;  Location: [place];  Service: General;  Laterality: Right;   cyst removed     from lower back   [place]     ORIF ANKLE FRACTURE Left 11/13/2020   Procedure: OPEN TREATMENT OF LEFT TRIMALLEOLAR ANKLE FRACTURE WITH POSTERIOR FIXATION, SYNDESMOSIS;  Surgeon: Erle Crocker, MD;  Location: [place];  Service: Orthopedics;  Laterality: Left;  LENGTH OF SURGERY: 1.5 HOURS   PORT-A-CATH REMOVAL N/A 06/03/2022   Procedure: REMOVAL PORT-A-CATH;  Surgeon: Stark Klein, MD;  Location: [place];  Service: General;  Laterality: N/A;   PORTACATH PLACEMENT Left 06/18/2021   Procedure: PORT PLACEMENT;   Surgeon: Stark Klein, MD;  Location: [place];  Service: General;  Laterality: Left;   Patient Active Problem List   Diagnosis Date Noted   Encounter for antineoplastic immunotherapy 02/10/2022   Bilateral pneumonia 11/05/2021   Pneumonia of both lower lobes due to infectious organism 11/04/2021   Genetic testing 07/10/2021   Family history of breast cancer 06/09/2021   Family history of prostate cancer 06/09/2021   Family history of colon cancer 06/09/2021   Malignant neoplasm of upper-inner quadrant of right breast in female, estrogen receptor negative ([place]) 06/09/2021   Pneumonia 03/21/2018   Fibroid tumor 03/10/2018   Adjustment insomnia 11/26/2015   Eczema 11/26/2015   Other specified abnormal findings of blood chemistry 11/26/2015   Healthcare maintenance 04/10/2014   Anxiety state 02/28/2014   OVERWEIGHT 03/27/2009   Overweight 03/27/2009   HYPERLIPIDEMIA 01/24/2009   ANEMIA 11/20/2007   HYPERTENSION 11/20/2007    PCP: Liliane Bade  REFERRING PROVIDER: Wilber Bihari, NP  REFERRING DIAG: s/p Right breast cancer  THERAPY DIAG:  Malignant neoplasm of upper-inner quadrant of right breast in female, estrogen receptor negative ([place])  Aftercare following surgery for neoplasm  Abnormal posture  Localized edema  ONSET DATE: 12/2021  Rationale for Evaluation and Treatment Rehabilitation  SUBJECTIVE  SUBJECTIVE STATEMENT: I am supposed to get my pump on Wednesday.  My breast may be a bit sore today.   PERTINENT HISTORY:  Initial Diagnosis 06/03/21 of Rt breast cancer. Neo-adjuvant chemotherapy started 06/23/21 of Carboplatin/Paclitaxel/Keytruda followed by Cyclophosphamide and Keytruda. Pt Rt lumpectomy and SLNB 0/4 LN positive on 12/21/21 with Dr. Barry Dienes, and then had radiation.     PAIN:  Are you having pain? No  PRECAUTIONS: Other: Right lymphedema risk  WEIGHT BEARING RESTRICTIONS No  FALLS:  Has patient fallen in last 6 months? No  LIVING ENVIRONMENT: Lives with: lives alone Lives in: House/apartment Stairs: No; Internal: 14 steps; on left going up and External: 2 steps; none Has following equipment at home: None  OCCUPATION: retired  Psychologist, clinical: travel, shop, read, walk  HAND DOMINANCE : right   PRIOR LEVEL OF FUNCTION: Independent  PATIENT GOALS Diminish swelling right breast   OBJECTIVE COGNITION:  Overall cognitive status: Within functional limits for tasks assessed   PALPATION: Right breast noted slightly firmer from radiation  OBSERVATIONS / OTHER ASSESSMENTS: Generalized right breast swelling with enlarged pores noted and complaints of heaviness. Mild fibrosis noted at inferior breast  POSTURE: forward head  UPPER EXTREMITY AROM/PROM: WNL  CERVICAL AROM: All within normal limits:   LYMPHEDEMA ASSESSMENTS:  SURGERY TYPE/DATE: Right lumpectomy 12/21/2021 NUMBER OF LYMPH NODES REMOVED: 0/4 CHEMOTHERAPY: neoadjuvant RADIATION:yes HORMONE TREATMENT: no INFECTIONS: No  LYMPHEDEMA ASSESSMENTS:   LANDMARK RIGHT  07/08/22  10 cm proximal to olecranon process 28.8  Olecranon process 23  10 cm proximal to ulnar styloid process 22.4  Just proximal to ulnar styloid process 16  Across hand at thumb web space 20.4  At base of 2nd digit 6.6  (Blank rows = not tested)  LANDMARK LEFT  07/08/22  10 cm proximal to olecranon process 30.5  Olecranon process 24.5  10 cm proximal to ulnar styloid process 22.2  Just proximal to ulnar styloid process 15.5  Across hand at thumb web space 20.3  At base of 2nd digit 6.2  (Blank rows = not tested)  L-DEX LYMPHEDEMA SCREENING: The patient was assessed using the L-Dex machine today to produce a lymphedema index baseline score. The patient will be reassessed on a regular basis (typically every 3  months) to obtain new L-Dex scores. If the score is > 6.5 points away from his/her baseline score indicating onset of subclinical lymphedema, it will be recommended to wear a compression garment for 4 weeks, 12 hours per day and then be reassessed. If the score continues to be > 6.5 points from baseline at reassessment, we will initiate lymphedema treatment. Assessing in this manner has a 95% rate of preventing clinically significant lymphedema. Score today 8.1 points above baseline  BREAST COMPLAINTS SURVEY: 31  TODAY'S TREATMENT  Pt permission and consent throughout each step of examination and treatment with modification and draping if requested when working on sensitive areas  07/26/2022 Soft tissue mobilization to right UT, pectorals and lateral trunk with cocoa butter Manual Lymph Drainage: In Supine: Short neck, 5 diaphragmatic breaths, bil axillary and Rt inguinal nodes, anterior inter-axillary and Rt axillo-inguinal anastomosis, then Rt breast and Rt UE working from proximal to distal then retracing all steps and continuing to instruct pt in this while performing  07/22/2022 Soft tissue mobilization to right UT, pectorals and lateral trunk with cocoa butter Manual Lymph Drainage: In Supine: Short neck, 5 diaphragmatic breaths, bil axillary and Rt inguinal nodes, anterior inter-axillary and Rt axillo-inguinal anastomosis, then Rt breast and Rt UE  working from proximal to distal then retracing all steps and continuing to instruct pt in this while performing  07/20/2022 Soft tissue mobilization to right UT, pectorals and lateral trunk with cocoa butter Manual Lymph Drainage: In Supine: Short neck, 5 diaphragmatic breaths, bil axillary and Rt inguinal nodes, anterior inter-axillary and Rt axillo-inguinal anastomosis, then Rt breast and Rt UE working from proximal to distal then retracing all steps and continuing to instruct pt in this while performing Another chip pack made for under right  breast at her request and placed in small TG soft. Also gave pt a handout for flexitouch  PATIENT EDUCATION:  Education details: see above Person educated: Patient Education method: Explanation Education comprehension: verbalized understanding  HOME EXERCISE PROGRAM: Self MLD Gave modified handout which UE steps per instruction section and then added breast in written words after step 5.  Instruction on how to do just breast or work through to the arm   ASSESSMENT: CLINICAL IMPRESSION:   Pt is feeling a little more sore today but overall no changes in feeling of the breast.  Continued manual therapy and work toward goals.   OBJECTIVE IMPAIRMENTS decreased knowledge of condition and increased edema.   ACTIVITY LIMITATIONS  doing everything but with swelling  LIMITATIONS:  none  PERSONAL FACTORS 1-2 comorbidities: Right breast Cancer s/p radiation  are also affecting patient's functional outcome.   REHAB POTENTIAL: Excellent  CLINICAL DECISION MAKING: Stable/uncomplicated  EVALUATION COMPLEXITY: Low  GOALS: Goals reviewed with patient? Yes    LONG TERM GOALS: Target date: 08/19/2022    Pt will have decreased right breast heaviness/swelling by 50% Baseline:  Goal status: INITIAL  2.  Pt will be independent in self MLD to the right breast and right UE to reduce breast swelling and prevent right UE swelling Baseline:  Goal status: INITIAL  3.  Pts breast complaints questionairre will be reduced to no greater than 16 Baseline:  Goal status: INITIAL  4.  Pts. Next SOZO screen will return to green Zone Baseline:  Goal status: INITIAL    PLAN: PT FREQUENCY: 2x/week  PT DURATION: 4 weeks  PLANNED INTERVENTIONS: Therapeutic exercises, Therapeutic activity, Patient/Family education, Self Care, Orthotic/Fit training, Manual lymph drainage, scar mobilization, Vasopneumatic device, Manual therapy, and Re-evaluation  PLAN FOR NEXT SESSION: Cont breast and Right UE MLD,  repeat SOZO on last visit or around 10/4 (pt in the yellow for SOZO),  *Demo sent to [place] 07/08/22 - VR   Alycea Segoviano, Adrian Prince, PT 07/26/2022, 10:47 AM

## 2022-07-29 ENCOUNTER — Encounter: Payer: Self-pay | Admitting: Rehabilitation

## 2022-07-29 ENCOUNTER — Ambulatory Visit: Payer: BC Managed Care – PPO | Admitting: Rehabilitation

## 2022-07-29 DIAGNOSIS — Z853 Personal history of malignant neoplasm of breast: Secondary | ICD-10-CM | POA: Diagnosis not present

## 2022-07-29 DIAGNOSIS — Z483 Aftercare following surgery for neoplasm: Secondary | ICD-10-CM

## 2022-07-29 DIAGNOSIS — R293 Abnormal posture: Secondary | ICD-10-CM

## 2022-07-29 DIAGNOSIS — R600 Localized edema: Secondary | ICD-10-CM

## 2022-07-29 DIAGNOSIS — Z171 Estrogen receptor negative status [ER-]: Secondary | ICD-10-CM

## 2022-07-29 NOTE — Therapy (Signed)
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 ([place])   Pneumonia 11/2021   hospitalized   Port-A-Cath in place 06/24/2021   Pre-diabetes    Past Surgical History:  Procedure Laterality Date   ABDOMINAL HYSTERECTOMY     still has ovaries   BREAST BIOPSY Right 05/23/2012   BREAST CYST EXCISION Right    BREAST LUMPECTOMY WITH RADIOACTIVE SEED AND SENTINEL LYMPH NODE BIOPSY Right 12/21/2021   Procedure: RIGHT BREAST LUMPECTOMY WITH RADIOACTIVE SEED AND SENTINEL LYMPH NODE BIOPSY;  Surgeon: Stark Klein, MD;  Location: [place];  Service: General;  Laterality: Right;   cyst removed     from lower back   [place]     ORIF ANKLE FRACTURE Left 11/13/2020   Procedure: OPEN TREATMENT OF LEFT TRIMALLEOLAR ANKLE FRACTURE WITH POSTERIOR FIXATION, SYNDESMOSIS;  Surgeon: Erle Crocker, MD;  Location: [place];  Service: Orthopedics;  Laterality: Left;  LENGTH OF SURGERY: 1.5 HOURS   PORT-A-CATH REMOVAL N/A 06/03/2022   Procedure: REMOVAL PORT-A-CATH;  Surgeon: Stark Klein, MD;  Location: [place];  Service: General;  Laterality: N/A;   PORTACATH PLACEMENT Left 06/18/2021   Procedure: PORT PLACEMENT;  Surgeon: Stark Klein, MD;  Location: [place];  Service: General;  Laterality: Left;   Patient Active Problem List   Diagnosis Date Noted   Encounter for antineoplastic immunotherapy 02/10/2022   Bilateral pneumonia 11/05/2021   Pneumonia of both lower lobes due to infectious organism 11/04/2021   Genetic  testing 07/10/2021   Family history of breast cancer 06/09/2021   Family history of prostate cancer 06/09/2021   Family history of colon cancer 06/09/2021   Malignant neoplasm of upper-inner quadrant of right breast in female, estrogen receptor negative ([place]) 06/09/2021   Pneumonia 03/21/2018   Fibroid tumor 03/10/2018   Adjustment insomnia 11/26/2015   Eczema 11/26/2015   Other specified abnormal findings of blood chemistry 11/26/2015   Healthcare maintenance 04/10/2014   Anxiety state 02/28/2014   OVERWEIGHT 03/27/2009   Overweight 03/27/2009   HYPERLIPIDEMIA 01/24/2009   ANEMIA 11/20/2007   HYPERTENSION 11/20/2007    PCP: Liliane Bade  REFERRING PROVIDER: Wilber Bihari, NP  REFERRING DIAG: s/p Right breast cancer  THERAPY DIAG:  No diagnosis found.  ONSET DATE: 12/2021  Rationale for Evaluation and Treatment Rehabilitation  SUBJECTIVE  SUBJECTIVE STATEMENT: The pump came yesterday and she will have someone coming today at 6 to help. She is having the normal soreness but it is getting better.   PERTINENT HISTORY:  Initial Diagnosis 06/03/21 of Rt breast cancer. Neo-adjuvant chemotherapy started 06/23/21 of Carboplatin/Paclitaxel/Keytruda followed by Cyclophosphamide and Keytruda. Pt Rt lumpectomy and SLNB 0/4 LN positive on 12/21/21 with Dr. Barry Dienes, and then had radiation.    PAIN:  Are you having pain? No  PRECAUTIONS: Other: Right lymphedema risk  WEIGHT BEARING RESTRICTIONS No  FALLS:  Has patient fallen in last 6 months? No  LIVING ENVIRONMENT: Lives with: lives alone Lives in: House/apartment Stairs: No; Internal: 14 steps; on left going up and External: 2 steps; none Has following equipment at home: None  OCCUPATION: retired  Psychologist, clinical: travel, shop, read, walk  HAND  DOMINANCE : right   PRIOR LEVEL OF FUNCTION: Independent  PATIENT GOALS Diminish swelling right breast   OBJECTIVE COGNITION:  Overall cognitive status: Within functional limits for tasks assessed   PALPATION: Right breast noted slightly firmer from radiation  OBSERVATIONS / OTHER ASSESSMENTS: Generalized right breast swelling with enlarged pores noted and complaints of heaviness. Mild fibrosis noted at inferior breast  POSTURE: forward head  UPPER EXTREMITY AROM/PROM: WNL  CERVICAL AROM: All within normal limits:   LYMPHEDEMA ASSESSMENTS:  SURGERY TYPE/DATE: Right lumpectomy 12/21/2021 NUMBER OF LYMPH NODES REMOVED: 0/4 CHEMOTHERAPY: neoadjuvant RADIATION:yes HORMONE TREATMENT: no INFECTIONS: No  LYMPHEDEMA ASSESSMENTS:   LANDMARK RIGHT  07/08/22  10 cm proximal to olecranon process 28.8  Olecranon process 23  10 cm proximal to ulnar styloid process 22.4  Just proximal to ulnar styloid process 16  Across hand at thumb web space 20.4  At base of 2nd digit 6.6  (Blank rows = not tested)  LANDMARK LEFT  07/08/22  10 cm proximal to olecranon process 30.5  Olecranon process 24.5  10 cm proximal to ulnar styloid process 22.2  Just proximal to ulnar styloid process 15.5  Across hand at thumb web space 20.3  At base of 2nd digit 6.2  (Blank rows = not tested)  L-DEX LYMPHEDEMA SCREENING: The patient was assessed using the L-Dex machine today to produce a lymphedema index baseline score. The patient will be reassessed on a regular basis (typically every 3 months) to obtain new L-Dex scores. If the score is > 6.5 points away from his/her baseline score indicating onset of subclinical lymphedema, it will be recommended to wear a compression garment for 4 weeks, 12 hours per day and then be reassessed. If the score continues to be > 6.5 points from baseline at reassessment, we will initiate lymphedema treatment. Assessing in this manner has a 95% rate of preventing clinically  significant lymphedema. Score today 8.1 points above baseline  BREAST COMPLAINTS SURVEY: 31  TODAY'S TREATMENT  Pt permission and consent throughout each step of examination and treatment with modification and draping if requested when working on sensitive areas  07/29/2022 Soft tissue mobilization to right UT, pectorals and lateral trunk with cocoa butter Manual Lymph Drainage: In Supine: Short neck, 5 diaphragmatic breaths, bil axillary and Rt inguinal nodes, anterior inter-axillary and Rt axillo-inguinal anastomosis, then Rt breast and Rt UE working from proximal to distal then retracing all steps and continuing to instruct pt in this while performing  07/26/2022 Soft tissue mobilization to right UT, pectorals and lateral trunk with cocoa butter Manual Lymph Drainage: In Supine: Short neck, 5 diaphragmatic breaths, bil axillary and Rt inguinal nodes, anterior inter-axillary and Rt  axillo-inguinal anastomosis, then Rt breast and Rt UE working from proximal to distal then retracing all steps and continuing to instruct pt in this while performing  07/22/2022 Soft tissue mobilization to right UT, pectorals and lateral trunk with cocoa butter Manual Lymph Drainage: In Supine: Short neck, 5 diaphragmatic breaths, bil axillary and Rt inguinal nodes, anterior inter-axillary and Rt axillo-inguinal anastomosis, then Rt breast and Rt UE working from proximal to distal then retracing all steps and continuing to instruct pt in this while performing  07/20/2022 Soft tissue mobilization to right UT, pectorals and lateral trunk with cocoa butter Manual Lymph Drainage: In Supine: Short neck, 5 diaphragmatic breaths, bil axillary and Rt inguinal nodes, anterior inter-axillary and Rt axillo-inguinal anastomosis, then Rt breast and Rt UE working from proximal to distal then retracing all steps and continuing to instruct pt in this while performing Another chip pack made for under right breast at her request and  placed in small TG soft. Also gave pt a handout for flexitouch  PATIENT EDUCATION:  Education details: see above Person educated: Patient Education method: Explanation Education comprehension: verbalized understanding  HOME EXERCISE PROGRAM: Self MLD Gave modified handout which UE steps per instruction section and then added breast in written words after step 5.  Instruction on how to do just breast or work through to the arm   ASSESSMENT: CLINICAL IMPRESSION:  Pt has noticed a difference with the lympathic massage and that the breast is feeling lighter .  Continued manual therapy and work toward goals. Pt will be traveling to [place] in two weeks and will follow up on how see feels and how many more session she would like.   OBJECTIVE IMPAIRMENTS decreased knowledge of condition and increased edema.   ACTIVITY LIMITATIONS  doing everything but with swelling  LIMITATIONS:  none  PERSONAL FACTORS 1-2 comorbidities: Right breast Cancer s/p radiation  are also affecting patient's functional outcome.   REHAB POTENTIAL: Excellent  CLINICAL DECISION MAKING: Stable/uncomplicated  EVALUATION COMPLEXITY: Low  GOALS: Goals reviewed with patient? Yes    LONG TERM GOALS: Target date: 08/19/2022    Pt will have decreased right breast heaviness/swelling by 50% Baseline:  Goal status: INITIAL  2.  Pt will be independent in self MLD to the right breast and right UE to reduce breast swelling and prevent right UE swelling Baseline:  Goal status: INITIAL  3.  Pts breast complaints questionairre will be reduced to no greater than 16 Baseline:  Goal status: INITIAL  4.  Pts. Next SOZO screen will return to green Zone Baseline:  Goal status: INITIAL    PLAN: PT FREQUENCY: 2x/week  PT DURATION: 4 weeks  PLANNED INTERVENTIONS: Therapeutic exercises, Therapeutic activity, Patient/Family education, Self Care, Orthotic/Fit training, Manual lymph drainage, scar mobilization,  Vasopneumatic device, Manual therapy, and Re-evaluation  PLAN FOR NEXT SESSION: Follow up on the pump    Hedwig Mcfall, Student-PT 07/29/2022, 10:05 AM

## 2022-08-02 ENCOUNTER — Ambulatory Visit: Payer: BC Managed Care – PPO | Admitting: Rehabilitation

## 2022-08-05 ENCOUNTER — Encounter: Payer: Self-pay | Admitting: Rehabilitation

## 2022-08-05 ENCOUNTER — Ambulatory Visit: Payer: BC Managed Care – PPO | Attending: Adult Health | Admitting: Rehabilitation

## 2022-08-05 DIAGNOSIS — C50211 Malignant neoplasm of upper-inner quadrant of right female breast: Secondary | ICD-10-CM | POA: Insufficient documentation

## 2022-08-05 DIAGNOSIS — Z171 Estrogen receptor negative status [ER-]: Secondary | ICD-10-CM | POA: Diagnosis present

## 2022-08-05 DIAGNOSIS — R293 Abnormal posture: Secondary | ICD-10-CM | POA: Diagnosis present

## 2022-08-05 DIAGNOSIS — Z483 Aftercare following surgery for neoplasm: Secondary | ICD-10-CM | POA: Insufficient documentation

## 2022-08-05 DIAGNOSIS — R600 Localized edema: Secondary | ICD-10-CM | POA: Diagnosis present

## 2022-08-05 NOTE — Therapy (Signed)
OUTPATIENT PHYSICAL THERAPY ONCOLOGY TREATMENT  Patient Name: Keshonda Monsour MRN: [id number] DOB:Sep 17, 1961, 61 y.o., female Today's Date: 08/05/2022   PT End of Session - 08/05/22 1051     Visit Number 9    PT Start Time 1003    PT Stop Time 0973    PT Time Calculation (min) 48 min    Activity Tolerance Patient tolerated treatment well    Behavior During Therapy WFL for tasks assessed/performed                Past Medical History:  Diagnosis Date   Anemia    had a fibroid tumor, was anemic at that time   Anxiety    Cancer [facility])    right breast IDC   Family history of breast cancer    Family history of colon cancer    Family history of prostate cancer    Hyperlipidemia    Hypertension    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 ([place])   Pneumonia 11/2021   hospitalized   Port-A-Cath in place 06/24/2021   Pre-diabetes    Past Surgical History:  Procedure Laterality Date   ABDOMINAL HYSTERECTOMY     still has ovaries   BREAST BIOPSY Right 05/23/2012   BREAST CYST EXCISION Right    BREAST LUMPECTOMY WITH RADIOACTIVE SEED AND SENTINEL LYMPH NODE BIOPSY Right 12/21/2021   Procedure: RIGHT BREAST LUMPECTOMY WITH RADIOACTIVE SEED AND SENTINEL LYMPH NODE BIOPSY;  Surgeon: Stark Klein, MD;  Location: [place];  Service: General;  Laterality: Right;   cyst removed     from lower back   [place]     ORIF ANKLE FRACTURE Left 11/13/2020   Procedure: OPEN TREATMENT OF LEFT TRIMALLEOLAR ANKLE FRACTURE WITH POSTERIOR FIXATION, SYNDESMOSIS;  Surgeon: Erle Crocker, MD;  Location: [place];  Service: Orthopedics;  Laterality: Left;  LENGTH OF SURGERY: 1.5 HOURS   PORT-A-CATH REMOVAL N/A 06/03/2022   Procedure: REMOVAL PORT-A-CATH;  Surgeon: Stark Klein, MD;  Location: [place];  Service: General;  Laterality: N/A;   PORTACATH PLACEMENT Left 06/18/2021   Procedure: PORT PLACEMENT;  Surgeon: Stark Klein, MD;  Location: [place];  Service: General;  Laterality: Left;   Patient Active Problem List   Diagnosis Date Noted   Encounter for antineoplastic immunotherapy 02/10/2022   Bilateral pneumonia 11/05/2021   Pneumonia of both lower lobes due to infectious organism 11/04/2021   Genetic testing 07/10/2021   Family history of breast cancer 06/09/2021   Family history of prostate cancer 06/09/2021   Family history of colon cancer 06/09/2021   Malignant neoplasm of upper-inner quadrant of right breast in female, estrogen receptor negative ([place]) 06/09/2021   Pneumonia 03/21/2018   Fibroid tumor 03/10/2018   Adjustment insomnia 11/26/2015   Eczema 11/26/2015   Other specified abnormal findings of blood chemistry 11/26/2015   Healthcare maintenance 04/10/2014   Anxiety state 02/28/2014   OVERWEIGHT 03/27/2009   Overweight 03/27/2009   HYPERLIPIDEMIA 01/24/2009   ANEMIA 11/20/2007   HYPERTENSION 11/20/2007    PCP: Liliane Bade  REFERRING PROVIDER: Wilber Bihari, NP  REFERRING DIAG: s/p Right breast cancer  THERAPY DIAG:  Malignant neoplasm of upper-inner quadrant of right breast in female, estrogen receptor negative ([place])  Aftercare following surgery for neoplasm  Abnormal posture  Localized edema  ONSET DATE: 12/2021  Rationale for Evaluation and Treatment Rehabilitation  SUBJECTIVE  SUBJECTIVE STATEMENT:  I leave Thursday for [place]. I got the machine now. I like it.   PERTINENT HISTORY:  Initial Diagnosis 06/03/21 of Rt breast cancer. Neo-adjuvant chemotherapy started 06/23/21 of Carboplatin/Paclitaxel/Keytruda followed by Cyclophosphamide and Keytruda. Pt Rt lumpectomy and SLNB 0/4 LN positive on 12/21/21 with Dr. Barry Dienes, and then had radiation.    PAIN:  Are you having pain? No  PRECAUTIONS: Other: Right  lymphedema risk  WEIGHT BEARING RESTRICTIONS No  FALLS:  Has patient fallen in last 6 months? No  LIVING ENVIRONMENT: Lives with: lives alone Lives in: House/apartment Stairs: No; Internal: 14 steps; on left going up and External: 2 steps; none Has following equipment at home: None  OCCUPATION: retired  Psychologist, clinical: travel, shop, read, walk  HAND DOMINANCE : right   PRIOR LEVEL OF FUNCTION: Independent  PATIENT GOALS Diminish swelling right breast   OBJECTIVE COGNITION:  Overall cognitive status: Within functional limits for tasks assessed   PALPATION: Right breast noted slightly firmer from radiation  OBSERVATIONS / OTHER ASSESSMENTS: Generalized right breast swelling with enlarged pores noted and complaints of heaviness. Mild fibrosis noted at inferior breast  LYMPHEDEMA ASSESSMENTS:   LANDMARK RIGHT  07/08/22  10 cm proximal to olecranon process 28.8  Olecranon process 23  10 cm proximal to ulnar styloid process 22.4  Just proximal to ulnar styloid process 16  Across hand at thumb web space 20.4  At base of 2nd digit 6.6  (Blank rows = not tested)  LANDMARK LEFT  07/08/22  10 cm proximal to olecranon process 30.5  Olecranon process 24.5  10 cm proximal to ulnar styloid process 22.2  Just proximal to ulnar styloid process 15.5  Across hand at thumb web space 20.3  At base of 2nd digit 6.2  (Blank rows = not tested)  BREAST COMPLAINTS SURVEY: 31  TODAY'S TREATMENT  Pt permission and consent throughout each step of examination and treatment with modification and draping if requested when working on sensitive areas 08/05/2022 Repeated SOZO which is now in the green  Soft tissue mobilization to right UT, pectorals and lateral trunk with cocoa butter Manual Lymph Drainage: In Supine: Short neck, 5 diaphragmatic breaths, bil axillary and Rt inguinal nodes, anterior inter-axillary and Rt axillo-inguinal anastomosis, then Rt breast and Rt UE working from proximal to  distal then retracing all steps and continuing to instruct pt in this while performing  07/29/2022 Soft tissue mobilization to right UT, pectorals and lateral trunk with cocoa butter Manual Lymph Drainage: In Supine: Short neck, 5 diaphragmatic breaths, bil axillary and Rt inguinal nodes, anterior inter-axillary and Rt axillo-inguinal anastomosis, then Rt breast and Rt UE working from proximal to distal then retracing all steps and continuing to instruct pt in this while performing  07/26/2022 Soft tissue mobilization to right UT, pectorals and lateral trunk with cocoa butter Manual Lymph Drainage: In Supine: Short neck, 5 diaphragmatic breaths, bil axillary and Rt inguinal nodes, anterior inter-axillary and Rt axillo-inguinal anastomosis, then Rt breast and Rt UE working from proximal to distal then retracing all steps and continuing to instruct pt in this while performing  PATIENT EDUCATION:  Education details: see above Person educated: Patient Education method: Explanation Education comprehension: verbalized understanding  HOME EXERCISE PROGRAM: Self MLD Gave modified handout which UE steps per instruction section and then added breast in written words after step 5.  Instruction on how to do just breast or work through to the arm   ASSESSMENT: CLINICAL IMPRESSION: SOZO is now back down in the  green zone - discussed how she can use the sleeve as needed at this point and we will resume every 3 month screenings.    OBJECTIVE IMPAIRMENTS decreased knowledge of condition and increased edema.   ACTIVITY LIMITATIONS  doing everything but with swelling  LIMITATIONS:  none  PERSONAL FACTORS 1-2 comorbidities: Right breast Cancer s/p radiation  are also affecting patient's functional outcome.   REHAB POTENTIAL: Excellent  CLINICAL DECISION MAKING: Stable/uncomplicated  EVALUATION COMPLEXITY: Low  GOALS: Goals reviewed with patient? Yes    LONG TERM GOALS: Target date: 08/19/2022     Pt will have decreased right breast heaviness/swelling by 50% Baseline:  Goal status: INITIAL  2.  Pt will be independent in self MLD to the right breast and right UE to reduce breast swelling and prevent right UE swelling Baseline:  Goal status: INITIAL  3.  Pts breast complaints questionairre will be reduced to no greater than 16 Baseline:  Goal status: INITIAL  4.  Pts. Next SOZO screen will return to green Zone Baseline:  Goal status: INITIAL    PLAN: PT FREQUENCY: 2x/week  PT DURATION: 4 weeks  PLANNED INTERVENTIONS: Therapeutic exercises, Therapeutic activity, Patient/Family education, Self Care, Orthotic/Fit training, Manual lymph drainage, scar mobilization, Vasopneumatic device, Manual therapy, and Re-evaluation  PLAN FOR NEXT SESSION:  Recheck after trip and see how the pump is going  Stark Bray, PT 08/05/2022, 10:52 AM

## 2022-08-25 ENCOUNTER — Encounter: Payer: Self-pay | Admitting: Rehabilitation

## 2022-08-25 ENCOUNTER — Ambulatory Visit: Payer: BC Managed Care – PPO | Admitting: Rehabilitation

## 2022-08-25 DIAGNOSIS — Z483 Aftercare following surgery for neoplasm: Secondary | ICD-10-CM

## 2022-08-25 DIAGNOSIS — R600 Localized edema: Secondary | ICD-10-CM

## 2022-08-25 DIAGNOSIS — C50211 Malignant neoplasm of upper-inner quadrant of right female breast: Secondary | ICD-10-CM | POA: Diagnosis not present

## 2022-08-25 DIAGNOSIS — Z171 Estrogen receptor negative status [ER-]: Secondary | ICD-10-CM

## 2022-08-25 DIAGNOSIS — R293 Abnormal posture: Secondary | ICD-10-CM

## 2022-08-25 NOTE — Therapy (Signed)
OUTPATIENT PHYSICAL THERAPY ONCOLOGY TREATMENT  Patient Name: Rita Davis MRN: [id number] DOB:Aug 07, 1961, 61 y.o., female Today's Date: 08/25/2022   PT End of Session - 08/25/22 1455     Visit Number 10    Number of Visits 8    Date for PT Re-Evaluation 08/04/22    PT Start Time 9509    PT Stop Time 1555    PT Time Calculation (min) 60 min    Activity Tolerance Patient tolerated treatment well    Behavior During Therapy WFL for tasks assessed/performed                 Past Medical History:  Diagnosis Date   Anemia    had a fibroid tumor, was anemic at that time   Anxiety    Cancer [facility])    right breast IDC   Family history of breast cancer    Family history of colon cancer    Family history of prostate cancer    Hyperlipidemia    Hypertension    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 ([place])   Pneumonia 11/2021   hospitalized   Port-A-Cath in place 06/24/2021   Pre-diabetes    Past Surgical History:  Procedure Laterality Date   ABDOMINAL HYSTERECTOMY     still has ovaries   BREAST BIOPSY Right 05/23/2012   BREAST CYST EXCISION Right    BREAST LUMPECTOMY WITH RADIOACTIVE SEED AND SENTINEL LYMPH NODE BIOPSY Right 12/21/2021   Procedure: RIGHT BREAST LUMPECTOMY WITH RADIOACTIVE SEED AND SENTINEL LYMPH NODE BIOPSY;  Surgeon: Stark Klein, MD;  Location: [place];  Service: General;  Laterality: Right;   cyst removed     from lower back   [place]     ORIF ANKLE FRACTURE Left 11/13/2020   Procedure: OPEN TREATMENT OF LEFT TRIMALLEOLAR ANKLE FRACTURE WITH POSTERIOR FIXATION, SYNDESMOSIS;  Surgeon: Erle Crocker, MD;  Location: [place];  Service: Orthopedics;  Laterality: Left;  LENGTH OF SURGERY: 1.5 HOURS   PORT-A-CATH REMOVAL N/A 06/03/2022   Procedure: REMOVAL PORT-A-CATH;  Surgeon: Stark Klein, MD;  Location: [place];  Service: General;  Laterality: N/A;   PORTACATH PLACEMENT Left 06/18/2021    Procedure: PORT PLACEMENT;  Surgeon: Stark Klein, MD;  Location: [place];  Service: General;  Laterality: Left;   Patient Active Problem List   Diagnosis Date Noted   Encounter for antineoplastic immunotherapy 02/10/2022   Bilateral pneumonia 11/05/2021   Pneumonia of both lower lobes due to infectious organism 11/04/2021   Genetic testing 07/10/2021   Family history of breast cancer 06/09/2021   Family history of prostate cancer 06/09/2021   Family history of colon cancer 06/09/2021   Malignant neoplasm of upper-inner quadrant of right breast in female, estrogen receptor negative ([place]) 06/09/2021   Pneumonia 03/21/2018   Fibroid tumor 03/10/2018   Adjustment insomnia 11/26/2015   Eczema 11/26/2015   Other specified abnormal findings of blood chemistry 11/26/2015   Healthcare maintenance 04/10/2014   Anxiety state 02/28/2014   OVERWEIGHT 03/27/2009   Overweight 03/27/2009   HYPERLIPIDEMIA 01/24/2009   ANEMIA 11/20/2007   HYPERTENSION 11/20/2007    PCP: Liliane Bade  REFERRING PROVIDER: Wilber Bihari, NP  REFERRING DIAG: s/p Right breast cancer  THERAPY DIAG:  Malignant neoplasm of upper-inner quadrant of right breast in female, estrogen receptor negative ([place])  Localized edema  Aftercare following surgery for neoplasm  Abnormal posture  ONSET DATE: 12/2021  Rationale for Evaluation and Treatment Rehabilitation  SUBJECTIVE  SUBJECTIVE STATEMENT: She had issue with the pump. She had to send it back and needs one more session. She states the breast felt heavy after the flight. She wants help with setting up the pump. She hasn't notice any swelling in her arm.  PERTINENT HISTORY:  Initial Diagnosis 06/03/21 of Rt breast cancer. Neo-adjuvant chemotherapy started 06/23/21 of  Carboplatin/Paclitaxel/Keytruda followed by Cyclophosphamide and Keytruda. Pt Rt lumpectomy and SLNB 0/4 LN positive on 12/21/21 with Dr. Barry Dienes, and then had radiation.    PAIN:  Are you having pain? No  PRECAUTIONS: Other: Right lymphedema risk  WEIGHT BEARING RESTRICTIONS No  FALLS:  Has patient fallen in last 6 months? No  LIVING ENVIRONMENT: Lives with: lives alone Lives in: House/apartment Stairs: No; Internal: 14 steps; on left going up and External: 2 steps; none Has following equipment at home: None  OCCUPATION: retired  Psychologist, clinical: travel, shop, read, walk  HAND DOMINANCE : right   PRIOR LEVEL OF FUNCTION: Independent  PATIENT GOALS Diminish swelling right breast   OBJECTIVE COGNITION:  Overall cognitive status: Within functional limits for tasks assessed   PALPATION: Right breast noted slightly firmer from radiation  OBSERVATIONS / OTHER ASSESSMENTS: Generalized right breast swelling with enlarged pores noted and complaints of heaviness. Mild fibrosis noted at inferior breast  LYMPHEDEMA ASSESSMENTS:   LANDMARK RIGHT  07/08/22  10 cm proximal to olecranon process 28.8  Olecranon process 23  10 cm proximal to ulnar styloid process 22.4  Just proximal to ulnar styloid process 16  Across hand at thumb web space 20.4  At base of 2nd digit 6.6  (Blank rows = not tested)  LANDMARK LEFT  07/08/22  10 cm proximal to olecranon process 30.5  Olecranon process 24.5  10 cm proximal to ulnar styloid process 22.2  Just proximal to ulnar styloid process 15.5  Across hand at thumb web space 20.3  At base of 2nd digit 6.2  (Blank rows = not tested)  BREAST COMPLAINTS SURVEY: 31 Updated: 08/25/2022- 29    TODAY'S TREATMENT  Pt permission and consent throughout each step of examination and treatment with modification and draping if requested when working on sensitive areas  08/25/2022 Soft tissue mobilization to right UT, pectorals and lateral trunk with cocoa  butter Manual Lymph Drainage: In Supine: Short neck, 5 diaphragmatic breaths, bil axillary and Rt inguinal nodes, anterior inter-axillary and Rt axillo-inguinal anastomosis, then Rt breast and Rt UE working from proximal to distal then retracing all steps and continuing to instruct pt in this while performing Patient educated on   08/05/2022 Repeated SOZO which is now in the green  Soft tissue mobilization to right UT, pectorals and lateral trunk with cocoa butter Manual Lymph Drainage: In Supine: Short neck, 5 diaphragmatic breaths, bil axillary and Rt inguinal nodes, anterior inter-axillary and Rt axillo-inguinal anastomosis, then Rt breast and Rt UE working from proximal to distal then retracing all steps and continuing to instruct pt in this while performing  07/29/2022 Soft tissue mobilization to right UT, pectorals and lateral trunk with cocoa butter Manual Lymph Drainage: In Supine: Short neck, 5 diaphragmatic breaths, bil axillary and Rt inguinal nodes, anterior inter-axillary and Rt axillo-inguinal anastomosis, then Rt breast and Rt UE working from proximal to distal then retracing all steps and continuing to instruct pt in this while performing  07/26/2022 Soft tissue mobilization to right UT, pectorals and lateral trunk with cocoa butter Manual Lymph Drainage: In Supine: Short neck, 5 diaphragmatic breaths, bil axillary and Rt inguinal nodes, anterior  inter-axillary and Rt axillo-inguinal anastomosis, then Rt breast and Rt UE working from proximal to distal then retracing all steps and continuing to instruct pt in this while performing  PATIENT EDUCATION:  Education details: see above Person educated: Patient Education method: Explanation Education comprehension: verbalized understanding  HOME EXERCISE PROGRAM: Self MLD Gave modified handout which UE steps per instruction section and then added breast in written words after step 5.  Instruction on how to do just breast or work  through to the arm   ASSESSMENT: CLINICAL IMPRESSION: Pt has met all of her goals She is wanting a pump trial to show her how to wear the pump. Patient was discharge due to improvements and patient satisfaction.  OBJECTIVE IMPAIRMENTS decreased knowledge of condition and increased edema.   ACTIVITY LIMITATIONS  doing everything but with swelling  LIMITATIONS:  none  PERSONAL FACTORS 1-2 comorbidities: Right breast Cancer s/p radiation  are also affecting patient's functional outcome.   REHAB POTENTIAL: Excellent  CLINICAL DECISION MAKING: Stable/uncomplicated  EVALUATION COMPLEXITY: Low  GOALS: Goals reviewed with patient? Yes    LONG TERM GOALS: Target date: 08/19/2022    Pt will have decreased right breast heaviness/swelling by 50% Baseline:  Goal status: MET  2.  Pt will be independent in self MLD to the right breast and right UE to reduce breast swelling and prevent right UE swelling Baseline:  Goal status: MET  3.  Pts breast complaints questionairre will be reduced to no greater than 16 Baseline: 29 Goal status: IN PROGRESS  4.  Pts. Next SOZO screen will return to green Zone Baseline:  Goal status: MET    PLAN: PT FREQUENCY:  discharged  PT DURATION: discharged  PLANNED INTERVENTIONS: Therapeutic exercises, Therapeutic activity, Patient/Family education, Self Care, Orthotic/Fit training, Manual lymph drainage, scar mobilization, Vasopneumatic device, Manual therapy, and Re-evaluation  PLAN FOR NEXT SESSION:  Discharge    Chadwick Reiswig, Student-PT 08/25/2022, 4:03 PM  PHYSICAL THERAPY DISCHARGE SUMMARY  Visits from Start of Care: 10  Current functional level related to goals / functional outcomes: Goals met    Remaining deficits: Goals met    Education / Equipment: HEP   Patient agrees to discharge. Patient goals were met. Patient is being discharged due to being pleased with the current functional level.

## 2022-09-16 ENCOUNTER — Ambulatory Visit
Admission: RE | Admit: 2022-09-16 | Discharge: 2022-09-16 | Disposition: A | Discharge status: Home / Self Care | Payer: BC Managed Care – PPO | Source: Ambulatory Visit | Attending: Adult Health | Admitting: Adult Health

## 2022-09-16 DIAGNOSIS — C50211 Malignant neoplasm of upper-inner quadrant of right female breast: Secondary | ICD-10-CM

## 2022-09-30 ENCOUNTER — Telehealth: Payer: Self-pay

## 2022-09-30 NOTE — Telephone Encounter (Signed)
[id number] - TREATMENT OF ESTABLISHED CHEMOTHERAPY-INDUCED NEUROPATHY WITH N-PALMITOYLETHANOLAMIDE, A CANNABIMIMETIC NUTRACEUTICAL: A RANDOMIZED DOUBLE-BLIND PHASE II PILOT TRIAL  Attempted to call Ms Mills for her 6 month follow up. No answer, left voicemail with my contact information.  Marjie Skiff Shiloh Southern, RN, BSN, [facility] She  Her  [facility] Clinical Research Nurse [place] [phone]  Pager [phone] 09/30/2022 11:49 AM

## 2022-10-08 ENCOUNTER — Other Ambulatory Visit: Payer: Self-pay | Admitting: *Deleted

## 2022-11-22 ENCOUNTER — Ambulatory Visit: Payer: BC Managed Care – PPO | Attending: Adult Health

## 2022-11-22 VITALS — Wt 187.2 lb

## 2022-11-22 DIAGNOSIS — Z483 Aftercare following surgery for neoplasm: Secondary | ICD-10-CM | POA: Insufficient documentation

## 2022-11-22 NOTE — Therapy (Signed)
OUTPATIENT PHYSICAL THERAPY SOZO SCREENING NOTE   Patient Name: Rita Davis MRN: [id number] DOB:Oct 04, 1961, 62 y.o., female Today's Date: 11/22/2022  PCP: Nicholas Lose, MD REFERRING PROVIDER: Wilber Bihari [place] of Session - 11/22/22 1546     Visit Number 10   # unchanged due to screen only   PT Start Time 1545    PT Stop Time 1550    PT Time Calculation (min) 5 min    Activity Tolerance Patient tolerated treatment well    Behavior During Therapy WFL for tasks assessed/performed             Past Medical History:  Diagnosis Date   Anemia    had a fibroid tumor, was anemic at that time   Anxiety    Cancer [facility])    right breast IDC   Family history of breast cancer    Family history of colon cancer    Family history of prostate cancer    Hyperlipidemia    Hypertension    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 ([place])   Pneumonia 11/2021   hospitalized   Port-A-Cath in place 06/24/2021   Pre-diabetes    Past Surgical History:  Procedure Laterality Date   ABDOMINAL HYSTERECTOMY     still has ovaries   BREAST BIOPSY Right 05/23/2012   BREAST CYST EXCISION Right    BREAST LUMPECTOMY WITH RADIOACTIVE SEED AND SENTINEL LYMPH NODE BIOPSY Right 12/21/2021   Procedure: RIGHT BREAST LUMPECTOMY WITH RADIOACTIVE SEED AND SENTINEL LYMPH NODE BIOPSY;  Surgeon: Stark Klein, MD;  Location: [place];  Service: General;  Laterality: Right;   cyst removed     from lower back   [place]     ORIF ANKLE FRACTURE Left 11/13/2020   Procedure: OPEN TREATMENT OF LEFT TRIMALLEOLAR ANKLE FRACTURE WITH POSTERIOR FIXATION, SYNDESMOSIS;  Surgeon: Erle Crocker, MD;  Location: [place];  Service: Orthopedics;  Laterality: Left;  LENGTH OF SURGERY: 1.5 HOURS   PORT-A-CATH REMOVAL N/A 06/03/2022   Procedure: REMOVAL PORT-A-CATH;  Surgeon: Stark Klein, MD;  Location: [place];  Service: General;  Laterality: N/A;   PORTACATH  PLACEMENT Left 06/18/2021   Procedure: PORT PLACEMENT;  Surgeon: Stark Klein, MD;  Location: [place];  Service: General;  Laterality: Left;   Patient Active Problem List   Diagnosis Date Noted   Encounter for antineoplastic immunotherapy 02/10/2022   Bilateral pneumonia 11/05/2021   Pneumonia of both lower lobes due to infectious organism 11/04/2021   Genetic testing 07/10/2021   Family history of breast cancer 06/09/2021   Family history of prostate cancer 06/09/2021   Family history of colon cancer 06/09/2021   Malignant neoplasm of upper-inner quadrant of right breast in female, estrogen receptor negative ([place]) 06/09/2021   Pneumonia 03/21/2018   Fibroid tumor 03/10/2018   Adjustment insomnia 11/26/2015   Eczema 11/26/2015   Other specified abnormal findings of blood chemistry 11/26/2015   Healthcare maintenance 04/10/2014   Anxiety state 02/28/2014   OVERWEIGHT 03/27/2009   Overweight 03/27/2009   HYPERLIPIDEMIA 01/24/2009   ANEMIA 11/20/2007   HYPERTENSION 11/20/2007    REFERRING DIAG: right breast cancer at risk for lymphedema  THERAPY DIAG:  Aftercare following surgery for neoplasm  PERTINENT HISTORY: Initial Diagnosis 06/03/21 of Rt breast cancer. Neo-adjuvant chemotherapy started 06/23/21 of Carboplatin/Paclitaxel/Keytruda followed by Cyclophosphamide and Keytruda. Pt Rt lumpectomy and SLNB 0/4 LN positive on 12/21/21 with Dr. Barry Dienes, and then had radiation.   PRECAUTIONS:  right UE Lymphedema risk, None  SUBJECTIVE: Pt returns for her 3 month L-Dex screen.   PAIN:  Are you having pain? No  SOZO SCREENING: Patient was assessed today using the SOZO machine to determine the lymphedema index score. This was compared to her baseline score. It was determined that she is within the recommended range when compared to her baseline and no further action is needed at this time. She will continue SOZO screenings. These are done every 3 months for 2 years post operatively followed by  every 6 months for 2 years, and then annually.   L-DEX FLOWSHEETS - 11/22/22 1500       L-DEX LYMPHEDEMA SCREENING   Measurement Type Unilateral    L-DEX MEASUREMENT EXTREMITY Upper Extremity    POSITION  Standing    DOMINANT SIDE Right    At Risk Side Right    BASELINE SCORE (UNILATERAL) -8.7    L-DEX SCORE (UNILATERAL) -15.3    VALUE CHANGE (UNILAT) -6.6               Otelia Limes, PTA 11/22/2022, 3:51 PM

## 2022-12-30 NOTE — Progress Notes (Signed)
Patient Care Team: Nicholas Lose, MD as PCP - General (Hematology and Oncology) Stark Klein, MD as Consulting Physician (General Surgery) Nicholas Lose, MD as Consulting Physician (Hematology and Oncology) Kyung Rudd, MD as Consulting Physician (Radiation Oncology) Loraine Leriche., MD (Internal Medicine)  DIAGNOSIS: No diagnosis found.  SUMMARY OF ONCOLOGIC HISTORY: Oncology History  Malignant neoplasm of upper-inner quadrant of right breast in female, estrogen receptor negative ([place])  06/03/2021 Initial Diagnosis   [place] woman status post right breast upper inner quadrant biopsy 06/03/2021 for a clinical T2N0 invasive ductal carcinoma, grade 3, functionally triple negative, with an MIB-1 of 95%.   06/09/2021 Cancer Staging   Staging form: Breast, AJCC 8th Edition - Clinical: Stage IIB (cT2, cN0, cM0, G3, ER-, PR-, HER2-) - Signed by Chauncey Cruel, MD on 06/09/2021 Histologic grading system: 3 grade system   06/23/2021 -  Neo-Adjuvant Chemotherapy   neoadjuvant chemotherapy consisting of carboplatin paclitaxel and pembrolizumab for 4 cycles starting 06/23/2021, to be followed by doxorubicin and cyclophosphamide and pembrolizumab for 4 cycles, with pembrolizumab to be continued to complete a year   06/30/2021 Genetic Testing   Patient has genetic testing done for personal history of breast cancer. No pathogenic variants were detected. Of note, a variant of uncertain significance was detected in the BRIP1 and TSC2 genes.  The genes analyzed include: APC, ATM, AXIN2, BARD1, BMPR1A, BRCA1, BRCA2, BRIP1, CDH1, CDK4*, CDKN2A (p14ARF)*, CDKN2A (p16INK4a)*, CHEK2, CTNNA1, DICER1, EPCAM (Deletion/duplication testing only), GREM1 (promoter region deletion/duplication testing only), KIT, MEN1, MLH1, MSH2, MSH3, MSH6, MUTYH, NBN, NF1, NHTL1, PALB2, PDGFRA*, PMS2, POLD1, POLE, PTEN, RAD50, RAD51C, RAD51D, SDHB, SDHC, SDHD, SMAD4, SMARCA4. STK11, TP53, TSC1, TSC2, and VHL.  The following  genes were evaluated for sequence changes only: SDHA and HOXB13 c.251G>A variant only.    11/04/2021 - 11/06/2021 Hospital Admission   Neutropenia related pneumonia   12/21/2021 Surgery   Right Lumpectomy: 0.5 cm grade 2 IDC 0/4 LN Neg, Margins Neg   01/01/2022 - 05/14/2022 Chemotherapy   Patient is on Treatment Plan : BREAST Pembrolizumab (200) q21d x 27 weeks     03/09/2022 - 04/06/2022 Radiation Therapy   Site Technique Total Dose (Gy) Dose per Fx (Gy) Completed Fx Beam Energies  Breast, Right: Breast_R 3D 42.56/42.56 2.66 16/16 6X, 10X  Breast, Right: Breast_R_Bst 3D 8/8 2 4/4 6X, 10X       CHIEF COMPLIANT:  Follow-up breast cancer surveillance  INTERVAL HISTORY: Rita Davis is a  62 y.o. with above-mentioned history of triple negative breast cancer, currently on chemotherapy with Adriamycin, Cytoxan, Keytruda. She presents to the clinic today for a follow-up.   ALLERGIES:  has No Known Allergies.  MEDICATIONS:  Current Outpatient Medications  Medication Sig Dispense Refill   amLODipine-olmesartan (AZOR) 5-40 MG tablet Take 1 tablet by mouth daily. 90 tablet 3   Investigational palmitoylethanolamide/placebo 400 MG capsule [facility]-2102 Take 400 mg by mouth daily. Take with food and swallow whole. Do not crush or open capsule. Store at room temperature. Keep container tightly closed.     Misc Natural Products (SUPER GREENS) POWD Take 1 Scoop by mouth daily.     Multiple Vitamins-Minerals (MULTIVITAMIN WITH MINERALS) tablet Take 1 tablet by mouth daily. Micro daily     rosuvastatin (CRESTOR) 20 MG tablet Take 1 tablet (20 mg total) by mouth daily.     No current facility-administered medications for this visit.    PHYSICAL EXAMINATION: ECOG PERFORMANCE STATUS: {CHL ONC ECOG PS:[id number]}  There were no vitals filed for this  visit. There were no vitals filed for this visit.  BREAST:*** No palpable masses or nodules in either right or left breasts. No palpable axillary  supraclavicular or infraclavicular adenopathy no breast tenderness or nipple discharge. (exam performed in the presence of a chaperone)  LABORATORY DATA:  I have reviewed the data as listed    Latest Ref Rng & Units 06/08/2022   11:51 AM 05/14/2022    8:16 AM 04/26/2022    2:03 PM  CMP  Glucose 70 - 99 mg/dL 81  122  115   BUN 8 - 23 mg/dL '14  15  17   '$ Creatinine 0.44 - 1.00 mg/dL 0.96  0.99  1.02   Sodium 135 - 145 mmol/L 139  140  140   Potassium 3.5 - 5.1 mmol/L 3.8  3.7  3.8   Chloride 98 - 111 mmol/L 106  107  105   CO2 22 - 32 mmol/L '29  28  28   '$ Calcium 8.9 - 10.3 mg/dL 9.4  9.6  9.6   Total Protein 6.5 - 8.1 g/dL 7.5  7.3  7.4   Total Bilirubin 0.3 - 1.2 mg/dL 0.3  0.3  0.4   Alkaline Phos 38 - 126 U/L 88  94  85   AST 15 - 41 U/L '19  22  23   '$ ALT 0 - 44 U/L '24  22  21     '$ Lab Results  Component Value Date   WBC 6.8 06/08/2022   HGB 12.6 06/08/2022   HCT 36.5 06/08/2022   MCV 85.1 06/08/2022   PLT 245 06/08/2022   NEUTROABS 5.1 06/08/2022    ASSESSMENT & PLAN:  No problem-specific Assessment & Plan notes found for this encounter.    No orders of the defined types were placed in this encounter.  The patient has a good understanding of the overall plan. she agrees with it. she will call with any problems that may develop before the next visit here. Total time spent: 30 mins including face to face time and time spent for planning, charting and co-ordination of care   Suzzette Righter, [place] 12/30/22    I Gardiner Coins am acting as a Education administrator for [employer]  ***

## 2023-01-04 ENCOUNTER — Inpatient Hospital Stay: Payer: BC Managed Care – PPO | Attending: Hematology and Oncology | Admitting: Hematology and Oncology

## 2023-01-04 ENCOUNTER — Other Ambulatory Visit: Payer: Self-pay

## 2023-01-04 VITALS — BP 148/87 | HR 76 | Temp 97.2°F | Resp 18 | Ht 68.0 in | Wt 189.2 lb

## 2023-01-04 DIAGNOSIS — Z79899 Other long term (current) drug therapy: Secondary | ICD-10-CM | POA: Insufficient documentation

## 2023-01-04 DIAGNOSIS — Z171 Estrogen receptor negative status [ER-]: Secondary | ICD-10-CM | POA: Diagnosis not present

## 2023-01-04 DIAGNOSIS — C50211 Malignant neoplasm of upper-inner quadrant of right female breast: Secondary | ICD-10-CM

## 2023-01-04 DIAGNOSIS — I890 Lymphedema, not elsewhere classified: Secondary | ICD-10-CM | POA: Insufficient documentation

## 2023-01-04 DIAGNOSIS — D709 Neutropenia, unspecified: Secondary | ICD-10-CM | POA: Insufficient documentation

## 2023-01-04 DIAGNOSIS — R202 Paresthesia of skin: Secondary | ICD-10-CM | POA: Diagnosis not present

## 2023-01-04 NOTE — Assessment & Plan Note (Signed)
status post right breast upper inner quadrant biopsy 06/03/2021 for a clinical T2N0 3.3 cm invasive ductal carcinoma, grade 3, functionally triple negative, ER 5%, PR 0%, HER2 2+ by IHC FISH negative, Ki-67 95%    Treatment plan:  1. Neoadjuvant chemotherapy consisting of carboplatin paclitaxel and pembrolizumab for 4 cycles starting 06/23/2021, to be followed by doxorubicin and cyclophosphamide and pembrolizumab for 4 cycles completed 12/09/2021, with pembrolizumab completed 05/14/2022 2. right Lumpectomy: 0.5 cm grade 2 IDC 0/4 LN Neg, Margins Neg 3.  Adjuvant radiation therapy 03/10/2022-04/06/2022 ------------------------------------------------------------------------------------------------------------------------------ Breast cancer surveillance: Breast exam 01/04/2023: Benign Mammogram 09/16/2022: Benign breast density category C  Return to clinic in 1 year for follow-up

## 2023-02-21 ENCOUNTER — Ambulatory Visit: Payer: BC Managed Care – PPO | Attending: Adult Health

## 2023-02-21 VITALS — Wt 189.5 lb

## 2023-02-21 DIAGNOSIS — Z483 Aftercare following surgery for neoplasm: Secondary | ICD-10-CM | POA: Insufficient documentation

## 2023-02-21 NOTE — Therapy (Signed)
OUTPATIENT PHYSICAL THERAPY SOZO SCREENING NOTE   Patient Name: Rita Davis MRN: [id number] DOB:July 10, 1961, 62 y.o., female Today's Date: 02/21/2023  PCP: Serena Croissant, MD REFERRING PROVIDER: Lillard Anes Cornett*   PT End of Session - 02/21/23 1640     Visit Number 10   # unchanged due to screen only   PT Start Time 1639    PT Stop Time 1646    PT Time Calculation (min) 7 min    Activity Tolerance Patient tolerated treatment well    Behavior During Therapy [facility] for tasks assessed/performed             Past Medical History:  Diagnosis Date   Anemia    had a fibroid tumor, was anemic at that time   Anxiety    Cancer [facility])    right breast IDC   Family history of breast cancer    Family history of colon cancer    Family history of prostate cancer    Hyperlipidemia    Hypertension    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 ([place])   Pneumonia 11/2021   hospitalized   Port-A-Cath in place 06/24/2021   Pre-diabetes    Past Surgical History:  Procedure Laterality Date   ABDOMINAL HYSTERECTOMY     still has ovaries   BREAST BIOPSY Right 05/23/2012   BREAST CYST EXCISION Right    BREAST LUMPECTOMY WITH RADIOACTIVE SEED AND SENTINEL LYMPH NODE BIOPSY Right 12/21/2021   Procedure: RIGHT BREAST LUMPECTOMY WITH RADIOACTIVE SEED AND SENTINEL LYMPH NODE BIOPSY;  Surgeon: Almond Lint, MD;  Location: [facility];  Service: General;  Laterality: Right;   cyst removed     from lower back   DILATION AND CURETTAGE OF UTERUS     ORIF ANKLE FRACTURE Left 11/13/2020   Procedure: OPEN TREATMENT OF LEFT TRIMALLEOLAR ANKLE FRACTURE WITH POSTERIOR FIXATION, SYNDESMOSIS;  Surgeon: Terance Hart, MD;  Location: [facility];  Service: Orthopedics;  Laterality: Left;  LENGTH OF SURGERY: 1.5 HOURS   PORT-A-CATH REMOVAL N/A 06/03/2022   Procedure: REMOVAL PORT-A-CATH;  Surgeon: Almond Lint, MD;  Location: [facility] OR;  Service: General;  Laterality: N/A;   PORTACATH  PLACEMENT Left 06/18/2021   Procedure: PORT PLACEMENT;  Surgeon: Almond Lint, MD;  Location: [facility] OR;  Service: General;  Laterality: Left;   Patient Active Problem List   Diagnosis Date Noted   Encounter for antineoplastic immunotherapy 02/10/2022   Bilateral pneumonia 11/05/2021   Pneumonia of both lower lobes due to infectious organism 11/04/2021   Genetic testing 07/10/2021   Family history of breast cancer 06/09/2021   Family history of prostate cancer 06/09/2021   Family history of colon cancer 06/09/2021   Malignant neoplasm of upper-inner quadrant of right breast in female, estrogen receptor negative 06/09/2021   Pneumonia 03/21/2018   Fibroid tumor 03/10/2018   Adjustment insomnia 11/26/2015   Eczema 11/26/2015   Other specified abnormal findings of blood chemistry 11/26/2015   Healthcare maintenance 04/10/2014   Anxiety state 02/28/2014   OVERWEIGHT 03/27/2009   Overweight 03/27/2009   HYPERLIPIDEMIA 01/24/2009   ANEMIA 11/20/2007   HYPERTENSION 11/20/2007    REFERRING DIAG: right breast cancer at risk for lymphedema  THERAPY DIAG:  Aftercare following surgery for neoplasm  PERTINENT HISTORY: Initial Diagnosis 06/03/21 of Rt breast cancer. Neo-adjuvant chemotherapy started 06/23/21 of Carboplatin/Paclitaxel/Keytruda followed by Cyclophosphamide and Keytruda. Pt Rt lumpectomy and SLNB 0/4 LN positive on 12/21/21 with Dr. Donell Beers, and then had radiation.   PRECAUTIONS: right  UE Lymphedema risk, None  SUBJECTIVE: Pt returns for her 3 month L-Dex screen.   PAIN:  Are you having pain? No  SOZO SCREENING: Patient was assessed today using the SOZO machine to determine the lymphedema index score. This was compared to her baseline score. It was determined that she is within the recommended range when compared to her baseline and no further action is needed at this time. She will continue SOZO screenings. These are done every 3 months for 2 years post operatively followed by every  6 months for 2 years, and then annually.   L-DEX FLOWSHEETS - 02/21/23 1600       L-DEX LYMPHEDEMA SCREENING   Measurement Type Unilateral    L-DEX MEASUREMENT EXTREMITY Upper Extremity    POSITION  Standing    DOMINANT SIDE Right    At Risk Side Right    BASELINE SCORE (UNILATERAL) -8.7    L-DEX SCORE (UNILATERAL) -5.1    VALUE CHANGE (UNILAT) 3.6    Comment kept on 1 Lt wrist bracelet               Hermenia Bers, PTA 02/21/2023, 4:47 PM

## 2023-03-14 ENCOUNTER — Telehealth: Payer: Self-pay

## 2023-03-14 NOTE — Telephone Encounter (Signed)
[facility]-2102 - TREATMENT OF ESTABLISHED CHEMOTHERAPY-INDUCED NEUROPATHY WITH N-PALMITOYLETHANOLAMIDE, A CANNABIMIMETIC NUTRACEUTICAL: A RANDOMIZED DOUBLE-BLIND PHASE II PILOT TRIAL  Spoke with patient for  12 month study follow up. All study requirements complete.  Margret Chance Collan Schoenfeld, RN, BSN, [facility] She  Her  Hers Clinical Research Nurse [facility] Direct Dial [phone]  Pager [phone] 03/14/2023 3:25 PM

## 2023-05-24 ENCOUNTER — Encounter: Payer: Self-pay | Admitting: *Deleted

## 2023-06-20 ENCOUNTER — Ambulatory Visit: Payer: BC Managed Care – PPO | Attending: Adult Health

## 2023-06-20 VITALS — Wt 198.1 lb

## 2023-06-20 DIAGNOSIS — Z483 Aftercare following surgery for neoplasm: Secondary | ICD-10-CM | POA: Insufficient documentation

## 2023-06-20 NOTE — Therapy (Signed)
OUTPATIENT PHYSICAL THERAPY SOZO SCREENING NOTE   Patient Name: Rita Davis MRN: [id number] DOB:1961-06-18, 62 y.o., female Today's Date: 06/20/2023  PCP: Serena Croissant, MD REFERRING PROVIDER: Lillard Anes Cornett*   PT End of Session - 06/20/23 1044     Visit Number 10   # unchanged due to screen only   PT Start Time 1042    PT Stop Time 1046    PT Time Calculation (min) 4 min    Activity Tolerance Patient tolerated treatment well    Behavior During Therapy WFL for tasks assessed/performed             Past Medical History:  Diagnosis Date   Anemia    had a fibroid tumor, was anemic at that time   Anxiety    Cancer [facility]    right breast IDC   Family history of breast cancer    Family history of colon cancer    Family history of prostate cancer    Hyperlipidemia    Hypertension    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 ([place])   Pneumonia 11/2021   hospitalized   Port-A-Cath in place 06/24/2021   Pre-diabetes    Past Surgical History:  Procedure Laterality Date   ABDOMINAL HYSTERECTOMY     still has ovaries   BREAST BIOPSY Right 05/23/2012   BREAST CYST EXCISION Right    BREAST LUMPECTOMY WITH RADIOACTIVE SEED AND SENTINEL LYMPH NODE BIOPSY Right 12/21/2021   Procedure: RIGHT BREAST LUMPECTOMY WITH RADIOACTIVE SEED AND SENTINEL LYMPH NODE BIOPSY;  Surgeon: Almond Lint, MD;  Location: [facility];  Service: General;  Laterality: Right;   cyst removed     from lower back   DILATION AND CURETTAGE OF UTERUS     ORIF ANKLE FRACTURE Left 11/13/2020   Procedure: OPEN TREATMENT OF LEFT TRIMALLEOLAR ANKLE FRACTURE WITH POSTERIOR FIXATION, SYNDESMOSIS;  Surgeon: Terance Hart, MD;  Location: [facility];  Service: Orthopedics;  Laterality: Left;  LENGTH OF SURGERY: 1.5 HOURS   PORT-A-CATH REMOVAL N/A 06/03/2022   Procedure: REMOVAL PORT-A-CATH;  Surgeon: Almond Lint, MD;  Location: [facility] OR;  Service: General;  Laterality: N/A;   PORTACATH  PLACEMENT Left 06/18/2021   Procedure: PORT PLACEMENT;  Surgeon: Almond Lint, MD;  Location: [facility] OR;  Service: General;  Laterality: Left;   Patient Active Problem List   Diagnosis Date Noted   Encounter for antineoplastic immunotherapy 02/10/2022   Bilateral pneumonia 11/05/2021   Pneumonia of both lower lobes due to infectious organism 11/04/2021   Genetic testing 07/10/2021   Family history of breast cancer 06/09/2021   Family history of prostate cancer 06/09/2021   Family history of colon cancer 06/09/2021   Malignant neoplasm of upper-inner quadrant of right breast in female, estrogen receptor negative ([facility]) 06/09/2021   Pneumonia 03/21/2018   Fibroid tumor 03/10/2018   Adjustment insomnia 11/26/2015   Eczema 11/26/2015   Other specified abnormal findings of blood chemistry 11/26/2015   Healthcare maintenance 04/10/2014   Anxiety state 02/28/2014   OVERWEIGHT 03/27/2009   Overweight 03/27/2009   HYPERLIPIDEMIA 01/24/2009   ANEMIA 11/20/2007   HYPERTENSION 11/20/2007    REFERRING DIAG: right breast cancer at risk for lymphedema  THERAPY DIAG:  Aftercare following surgery for neoplasm  PERTINENT HISTORY: Initial Diagnosis 06/03/21 of Rt breast cancer. Neo-adjuvant chemotherapy started 06/23/21 of Carboplatin/Paclitaxel/Keytruda followed by Cyclophosphamide and Keytruda. Pt Rt lumpectomy and SLNB 0/4 LN positive on 12/21/21 with Dr. Donell Beers, and then had radiation.   PRECAUTIONS:  right UE Lymphedema risk, None  SUBJECTIVE: Pt returns for her 3 month L-dex screen. "I went to [place] during the olympics and didn't wear my sleeve like I should have."  PAIN:  Are you having pain? No  SOZO SCREENING:  Patient was assessed today using the SOZO machine to determine the lymphedema index score. This was compared to her baseline score. It was determined that she is NOT within the recommended range when compared to her baseline. She already has a compression sleeve and was educated to  resume wear of this x1 month as she has done before. It is recommended she return in 1 month to be reassessed. If she continues to measure outside the recommended range, physical therapy treatment will be recommended at that time and a referral requested.   L-DEX FLOWSHEETS - 06/20/23 1000       L-DEX LYMPHEDEMA SCREENING   Measurement Type Unilateral    L-DEX MEASUREMENT EXTREMITY Upper Extremity    POSITION  Standing    DOMINANT SIDE Right    At Risk Side Right    BASELINE SCORE (UNILATERAL) -8.7    L-DEX SCORE (UNILATERAL) -1.9    VALUE CHANGE (UNILAT) 6.8              Hermenia Bers, PTA 06/20/2023, 10:49 AM

## 2023-07-21 ENCOUNTER — Ambulatory Visit: Payer: BC Managed Care – PPO | Attending: Adult Health

## 2023-07-21 DIAGNOSIS — Z483 Aftercare following surgery for neoplasm: Secondary | ICD-10-CM | POA: Insufficient documentation

## 2023-07-21 DIAGNOSIS — Z171 Estrogen receptor negative status [ER-]: Secondary | ICD-10-CM | POA: Insufficient documentation

## 2023-07-21 DIAGNOSIS — C50211 Malignant neoplasm of upper-inner quadrant of right female breast: Secondary | ICD-10-CM | POA: Insufficient documentation

## 2023-07-21 NOTE — Therapy (Signed)
OUTPATIENT PHYSICAL THERAPY SOZO SCREENING NOTE   Patient Name: Rita Davis MRN: [id number] DOB:[birth year], 62 y.o., female Today's Date: 07/21/2023  PCP: Serena Croissant, MD REFERRING PROVIDER: Lillard Anes Cornett*   PT End of Session - 07/21/23 1005     Visit Number 10   unchanged due to screen only   PT Start Time 1006    PT Stop Time 1016    PT Time Calculation (min) 10 min    Activity Tolerance Patient tolerated treatment well    Behavior During Therapy WFL for tasks assessed/performed             Past Medical History:  Diagnosis Date   Anemia    had a fibroid tumor, was anemic at that time   Anxiety    Cancer [facility])    right breast IDC   Family history of breast cancer    Family history of colon cancer    Family history of prostate cancer    Hyperlipidemia    Hypertension    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 ([place])   Pneumonia 11/2021   hospitalized   Port-A-Cath in place 06/24/2021   Pre-diabetes    Past Surgical History:  Procedure Laterality Date   ABDOMINAL HYSTERECTOMY     still has ovaries   BREAST BIOPSY Right 05/23/2012   BREAST CYST EXCISION Right    BREAST LUMPECTOMY WITH RADIOACTIVE SEED AND SENTINEL LYMPH NODE BIOPSY Right 12/21/2021   Procedure: RIGHT BREAST LUMPECTOMY WITH RADIOACTIVE SEED AND SENTINEL LYMPH NODE BIOPSY;  Surgeon: Almond Lint, MD;  Location: [facility];  Service: General;  Laterality: Right;   cyst removed     from lower back   DILATION AND CURETTAGE OF UTERUS     ORIF ANKLE FRACTURE Left 11/13/2020   Procedure: OPEN TREATMENT OF LEFT TRIMALLEOLAR ANKLE FRACTURE WITH POSTERIOR FIXATION, SYNDESMOSIS;  Surgeon: Terance Hart, MD;  Location:  [facility];  Service: Orthopedics;  Laterality: Left;  LENGTH OF SURGERY: 1.5 HOURS   PORT-A-CATH REMOVAL N/A 06/03/2022   Procedure: REMOVAL PORT-A-CATH;  Surgeon: Almond Lint, MD;  [facility];  Service: General;  Laterality: N/A;   PORTACATH  PLACEMENT Left 06/18/2021   Procedure: PORT PLACEMENT;  Surgeon: Almond Lint, MD;  Location: [facility] OR;  Service: General;  Laterality: Left;   Patient Active Problem List   Diagnosis Date Noted   Encounter for antineoplastic immunotherapy 02/10/2022   Bilateral pneumonia 11/05/2021   Pneumonia of both lower lobes due to infectious organism 11/04/2021   Genetic testing 07/10/2021   Family history of breast cancer 06/09/2021   Family history of prostate cancer 06/09/2021   Family history of colon cancer 06/09/2021   Malignant neoplasm of upper-inner quadrant of right breast in female, estrogen receptor negative ([facility]) 06/09/2021   Pneumonia 03/21/2018   Fibroid tumor 03/10/2018   Adjustment insomnia 11/26/2015   Eczema 11/26/2015   Other specified abnormal findings of blood chemistry 11/26/2015   Healthcare maintenance 04/10/2014   Anxiety state 02/28/2014   OVERWEIGHT 03/27/2009   Overweight 03/27/2009   HYPERLIPIDEMIA 01/24/2009   ANEMIA 11/20/2007   HYPERTENSION 11/20/2007    REFERRING DIAG: right breast cancer at risk for lymphedema  THERAPY DIAG:  Aftercare following surgery for neoplasm  Malignant neoplasm of upper-inner quadrant of right breast in female, estrogen receptor negative ([facility])  PERTINENT HISTORY:  Initial Diagnosis 06/03/21 of Rt breast cancer. Neo-adjuvant chemotherapy started 06/23/21 of Carboplatin/Paclitaxel/Keytruda followed by Cyclophosphamide and Keytruda. Pt Rt lumpectomy and SLNB  0/4 LN positive on 12/21/21 with Dr. Donell Beers, and then had radiation.     PRECAUTIONS: right UE Lymphedema risk,   SUBJECTIVE:  Pt has been wearing her sleeve every day and removing at night.  PAIN:  Are you having pain? No  SOZO SCREENING: Patient was assessed today using the SOZO machine to determine the lymphedema index score. This was compared to her baseline score. It was determined that she is within the recommended range when compared to her baseline and no further  action is needed at this time. She will continue SOZO screenings. These are done every 3 months for 2 years post operatively followed by every 6 months for 2 years, and then annually.      Rita Davis, PT 07/21/2023, 10:24 AM

## 2023-08-19 NOTE — Telephone Encounter (Signed)
TC

## 2023-08-30 ENCOUNTER — Other Ambulatory Visit: Payer: Self-pay | Admitting: Hematology and Oncology

## 2023-08-30 DIAGNOSIS — Z853 Personal history of malignant neoplasm of breast: Secondary | ICD-10-CM

## 2023-09-23 ENCOUNTER — Ambulatory Visit
Admission: RE | Admit: 2023-09-23 | Discharge: 2023-09-23 | Disposition: A | Discharge status: Home / Self Care | Payer: BC Managed Care – PPO | Source: Ambulatory Visit | Attending: Hematology and Oncology

## 2023-09-23 DIAGNOSIS — Z853 Personal history of malignant neoplasm of breast: Secondary | ICD-10-CM

## 2023-09-26 ENCOUNTER — Ambulatory Visit: Payer: BC Managed Care – PPO

## 2023-10-10 ENCOUNTER — Ambulatory Visit: Payer: BC Managed Care – PPO | Attending: Adult Health

## 2023-10-10 VITALS — Wt 199.5 lb

## 2023-10-10 DIAGNOSIS — Z483 Aftercare following surgery for neoplasm: Secondary | ICD-10-CM | POA: Insufficient documentation

## 2023-10-10 NOTE — Therapy (Signed)
OUTPATIENT PHYSICAL THERAPY SOZO SCREENING NOTE   Patient Name: Rita Davis MRN: [id number] DOB:1960-11-20, 62 y.o., female Today's Date: 10/10/2023  PCP: Serena Croissant, MD REFERRING PROVIDER: Lillard Anes Cornett*   PT End of Session - 10/10/23 [phone]     Visit Number 10   # unhanged due to screen only   PT Start Time 0935    PT Stop Time 0940    PT Time Calculation (min) 5 min    Activity Tolerance Patient tolerated treatment well    Behavior During Therapy WFL for tasks assessed/performed             Past Medical History:  Diagnosis Date   Anemia    had a fibroid tumor, was anemic at that time   Anxiety    Cancer [facility])    right breast IDC   Family history of breast cancer    Family history of colon cancer    Family history of prostate cancer    Hyperlipidemia    Hypertension    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 ([place])   Pneumonia 11/2021   hospitalized   Port-A-Cath in place 06/24/2021   Pre-diabetes    Past Surgical History:  Procedure Laterality Date   ABDOMINAL HYSTERECTOMY     still has ovaries   BREAST BIOPSY Right 05/23/2012   BREAST BIOPSY Right 06/2021   BREAST CYST EXCISION Right    BREAST LUMPECTOMY Right 12/2021   BREAST LUMPECTOMY WITH RADIOACTIVE SEED AND SENTINEL LYMPH NODE BIOPSY Right 12/21/2021   Procedure: RIGHT BREAST LUMPECTOMY WITH RADIOACTIVE SEED AND SENTINEL LYMPH NODE BIOPSY;  Surgeon: Almond Lint, MD;  Location: [facility];  Service: General;  Laterality: Right;   cyst removed     from lower back   DILATION AND CURETTAGE OF UTERUS     ORIF ANKLE FRACTURE Left 11/13/2020   Procedure: OPEN TREATMENT OF LEFT TRIMALLEOLAR ANKLE FRACTURE WITH POSTERIOR FIXATION, SYNDESMOSIS;  Surgeon: Terance Hart, MD;  Location: [facility];  Service: Orthopedics;  Laterality: Left;  LENGTH OF SURGERY: 1.5 HOURS   PORT-A-CATH REMOVAL N/A 06/03/2022   Procedure: REMOVAL PORT-A-CATH;  Surgeon: Almond Lint,  MD;  Location: [facility] OR;  Service: General;  Laterality: N/A;   PORTACATH PLACEMENT Left 06/18/2021   Procedure: PORT PLACEMENT;  Surgeon: Almond Lint, MD;  Location: [facility] OR;  Service: General;  Laterality: Left;   Patient Active Problem List   Diagnosis Date Noted   Encounter for antineoplastic immunotherapy 02/10/2022   Bilateral pneumonia 11/05/2021   Pneumonia of both lower lobes due to infectious organism 11/04/2021   Genetic testing 07/10/2021   Family history of breast cancer 06/09/2021   Family history of prostate cancer 06/09/2021   Family history of colon cancer 06/09/2021   Malignant neoplasm of upper-inner quadrant of right breast in female, estrogen receptor negative (HCC) 06/09/2021   Pneumonia 03/21/2018   Fibroid tumor 03/10/2018   Adjustment insomnia 11/26/2015   Eczema 11/26/2015   Other specified abnormal findings of blood chemistry 11/26/2015   Healthcare maintenance 04/10/2014   Anxiety state 02/28/2014   OVERWEIGHT 03/27/2009   Overweight 03/27/2009   HYPERLIPIDEMIA 01/24/2009   ANEMIA 11/20/2007   HYPERTENSION 11/20/2007    REFERRING DIAG: right breast cancer at risk for lymphedema  THERAPY DIAG: Aftercare following surgery for neoplasm  PERTINENT HISTORY:  Initial Diagnosis 06/03/21 of Rt breast cancer. Neo-adjuvant chemotherapy started 06/23/21 of Carboplatin/Paclitaxel/Keytruda followed by Cyclophosphamide and Keytruda. Pt Rt lumpectomy and SLNB 0/4 LN positive  on 12/21/21 with Dr. Donell Beers, and then had radiation.     PRECAUTIONS: right UE Lymphedema risk,   SUBJECTIVE: Pt returns for her 3 month L-Dex screen.    PAIN:  Are you having pain? No  SOZO SCREENING: Patient was assessed today using the SOZO machine to determine the lymphedema index score. This was compared to her baseline score. It was determined that she is within the recommended range when compared to her baseline and no further action is needed at this time. She will continue SOZO  screenings. These are done every 3 months for 2 years post operatively followed by every 6 months for 2 years, and then annually.   L-DEX FLOWSHEETS - 10/10/23 0900       L-DEX LYMPHEDEMA SCREENING   Measurement Type Unilateral    L-DEX MEASUREMENT EXTREMITY Upper Extremity    POSITION  Standing    DOMINANT SIDE Right    At Risk Side Right    BASELINE SCORE (UNILATERAL) -8.7    L-DEX SCORE (UNILATERAL) -9.2    VALUE CHANGE (UNILAT) -0.5                Hermenia Bers, PTA 10/10/2023, 9:39 AM

## 2024-01-04 ENCOUNTER — Inpatient Hospital Stay: Payer: BC Managed Care – PPO | Attending: Hematology and Oncology | Admitting: Hematology and Oncology

## 2024-01-04 VITALS — BP 150/84 | HR 97 | Temp 97.6°F | Resp 18 | Wt 207.6 lb

## 2024-01-04 DIAGNOSIS — Z1732 Human epidermal growth factor receptor 2 negative status: Secondary | ICD-10-CM | POA: Insufficient documentation

## 2024-01-04 DIAGNOSIS — Z79899 Other long term (current) drug therapy: Secondary | ICD-10-CM | POA: Diagnosis not present

## 2024-01-04 DIAGNOSIS — I890 Lymphedema, not elsewhere classified: Secondary | ICD-10-CM | POA: Insufficient documentation

## 2024-01-04 DIAGNOSIS — Z1722 Progesterone receptor negative status: Secondary | ICD-10-CM | POA: Diagnosis not present

## 2024-01-04 DIAGNOSIS — C50211 Malignant neoplasm of upper-inner quadrant of right female breast: Secondary | ICD-10-CM | POA: Diagnosis present

## 2024-01-04 DIAGNOSIS — J189 Pneumonia, unspecified organism: Secondary | ICD-10-CM | POA: Insufficient documentation

## 2024-01-04 DIAGNOSIS — Z171 Estrogen receptor negative status [ER-]: Secondary | ICD-10-CM | POA: Insufficient documentation

## 2024-01-04 DIAGNOSIS — D709 Neutropenia, unspecified: Secondary | ICD-10-CM | POA: Diagnosis not present

## 2024-01-04 NOTE — Progress Notes (Signed)
 Patient Care Team: Serena Croissant, MD as PCP - General (Hematology and Oncology) Almond Lint, MD as Consulting Physician (General Surgery) Serena Croissant, MD as Consulting Physician (Hematology and Oncology) Dorothy Puffer, MD as Consulting Physician (Radiation Oncology) Cheron Schaumann., MD (Internal Medicine)  DIAGNOSIS:  Encounter Diagnosis  Name Primary?   Malignant neoplasm of upper-inner quadrant of right breast in female, estrogen receptor negative (HCC) Yes    SUMMARY OF ONCOLOGIC HISTORY: Oncology History  Malignant neoplasm of upper-inner quadrant of right breast in female, estrogen receptor negative (HCC)  06/03/2021 Initial Diagnosis   [place] woman status post right breast upper inner quadrant biopsy 06/03/2021 for a clinical T2N0 invasive ductal carcinoma, grade 3, functionally triple negative, with an MIB-1 of 95%.   06/09/2021 Cancer Staging   Staging form: Breast, AJCC 8th Edition - Clinical: Stage IIB (cT2, cN0, cM0, G3, ER-, PR-, HER2-) - Signed by Lowella Dell, MD on 06/09/2021 Histologic grading system: 3 grade system   06/23/2021 -  Neo-Adjuvant Chemotherapy   neoadjuvant chemotherapy consisting of carboplatin paclitaxel and pembrolizumab for 4 cycles starting 06/23/2021, to be followed by doxorubicin and cyclophosphamide and pembrolizumab for 4 cycles, with pembrolizumab to be continued to complete a year   06/30/2021 Genetic Testing   Patient has genetic testing done for personal history of breast cancer. No pathogenic variants were detected. Of note, a variant of uncertain significance was detected in the BRIP1 and TSC2 genes.  The genes analyzed include: APC, ATM, AXIN2, BARD1, BMPR1A, BRCA1, BRCA2, BRIP1, CDH1, CDK4*, CDKN2A (p14ARF)*, CDKN2A (p16INK4a)*, CHEK2, CTNNA1, DICER1, EPCAM (Deletion/duplication testing only), GREM1 (promoter region deletion/duplication testing only), KIT, MEN1, MLH1, MSH2, MSH3, MSH6, MUTYH, NBN, NF1, NHTL1, PALB2, PDGFRA*,  PMS2, POLD1, POLE, PTEN, RAD50, RAD51C, RAD51D, SDHB, SDHC, SDHD, SMAD4, SMARCA4. STK11, TP53, TSC1, TSC2, and VHL.  The following genes were evaluated for sequence changes only: SDHA and HOXB13 c.251G>A variant only.    11/04/2021 - 11/06/2021 Hospital Admission   Neutropenia related pneumonia   12/21/2021 Surgery   Right Lumpectomy: 0.5 cm grade 2 IDC 0/4 LN Neg, Margins Neg   01/01/2022 - 05/14/2022 Chemotherapy   Patient is on Treatment Plan : BREAST Pembrolizumab (200) q21d x 27 weeks     03/09/2022 - 04/06/2022 Radiation Therapy   Site Technique Total Dose (Gy) Dose per Fx (Gy) Completed Fx Beam Energies  Breast, Right: Breast_R 3D 42.56/42.56 2.66 16/16 6X, 10X  Breast, Right: Breast_R_Bst 3D 8/8 2 4/4 6X, 10X       CHIEF COMPLIANT: Surveillance of breast cancer  HISTORY OF PRESENT ILLNESS: Ms. Coombes is a 63 year old with above-mentioned history of breast cancer who is currently on surveillance.  She is not having any problems or issues with her breast.  Denies any lumps or nodules of concern.  She uses a lymphedema sleeve intermittently.  Last year she went to [place] and had a wonderful time  ALLERGIES:  has no known allergies.  MEDICATIONS:  Current Outpatient Medications  Medication Sig Dispense Refill   amLODipine-olmesartan (AZOR) 5-40 MG tablet Take 1 tablet by mouth daily. 90 tablet 3   bimatoprost (LATISSE) 0.03 % ophthalmic solution      Investigational palmitoylethanolamide/placebo 400 MG capsule ACCRU--2102 Take 400 mg by mouth daily. Take with food and swallow whole. Do not crush or open capsule. Store at room temperature. Keep container tightly closed.     metroNIDAZOLE (FLAGYL) 500 MG tablet Take 500 mg by mouth 2 (two) times daily.     Misc Natural Products (SUPER  GREENS) POWD Take 1 Scoop by mouth daily.     Multiple Vitamins-Minerals (MULTIVITAMIN WITH MINERALS) tablet Take 1 tablet by mouth daily. Micro daily     rosuvastatin (CRESTOR) 20 MG tablet Take 1 tablet (20 mg  total) by mouth daily.     No current facility-administered medications for this visit.    PHYSICAL EXAMINATION: ECOG PERFORMANCE STATUS: 1 - Symptomatic but completely ambulatory  Vitals:   01/04/24 1002  BP: (!) 150/84  Pulse: 97  Resp: 18  Temp: 97.6 F (36.4 C)  SpO2: 100%   Filed Weights   01/04/24 1002  Weight: 207 lb 9.6 oz (94.2 kg)    Physical Exam No palpable lumps or nodules in bilateral breasts or axilla  (exam performed in the presence of a chaperone)  LABORATORY DATA:  I have reviewed the data as listed    Latest Ref Rng & Units 06/08/2022   11:51 AM 05/14/2022    8:16 AM 04/26/2022    2:03 PM  CMP  Glucose 70 - 99 mg/dL 81  098  119   BUN 8 - 23 mg/dL 14  15  17    Creatinine 0.44 - 1.00 mg/dL 1.47  8.29  5.62   Sodium 135 - 145 mmol/L 139  140  140   Potassium 3.5 - 5.1 mmol/L 3.8  3.7  3.8   Chloride 98 - 111 mmol/L 106  107  105   CO2 22 - 32 mmol/L 29  28  28    Calcium 8.9 - 10.3 mg/dL 9.4  9.6  9.6   Total Protein 6.5 - 8.1 g/dL 7.5  7.3  7.4   Total Bilirubin 0.3 - 1.2 mg/dL 0.3  0.3  0.4   Alkaline Phos 38 - 126 U/L 88  94  85   AST 15 - 41 U/L 19  22  23    ALT 0 - 44 U/L 24  22  21      Lab Results  Component Value Date   WBC 6.8 06/08/2022   HGB 12.6 06/08/2022   HCT 36.5 06/08/2022   MCV 85.1 06/08/2022   PLT 245 06/08/2022   NEUTROABS 5.1 06/08/2022    ASSESSMENT & PLAN:  Malignant neoplasm of upper-inner quadrant of right breast in female, estrogen receptor negative (HCC) status post right breast upper inner quadrant biopsy 06/03/2021 for a clinical T2N0 3.3 cm invasive ductal carcinoma, grade 3, functionally triple negative, ER 5%, PR 0%, HER2 2+ by IHC FISH negative, Ki-67 95%    Treatment plan:  1. Neoadjuvant chemotherapy consisting of carboplatin paclitaxel and pembrolizumab for 4 cycles starting 06/23/2021, to be followed by doxorubicin and cyclophosphamide and pembrolizumab for 4 cycles completed 12/09/2021, with pembrolizumab  completed 05/14/2022 2. right Lumpectomy: 0.5 cm grade 2 IDC 0/4 LN Neg, Margins Neg 3.  Adjuvant radiation therapy 03/10/2022-04/06/2022 ------------------------------------------------------------------------------------------------------------------------------ Breast cancer surveillance: Mammogram 09/23/2023: Benign breast density category C Breast Exam: 01/04/24: Benign   Return to clinic in 1 year for follow-up    No orders of the defined types were placed in this encounter.  The patient has a good understanding of the overall plan. she agrees with it. she will call with any problems that may develop before the next visit here. Total time spent: 30 mins including face to face time and time spent for planning, charting and co-ordination of care   Tamsen Meek, MD 01/04/24

## 2024-01-04 NOTE — Assessment & Plan Note (Signed)
 status post right breast upper inner quadrant biopsy 06/03/2021 for a clinical T2N0 3.3 cm invasive ductal carcinoma, grade 3, functionally triple negative, ER 5%, PR 0%, HER2 2+ by IHC FISH negative, Ki-67 95%    Treatment plan:  1. Neoadjuvant chemotherapy consisting of carboplatin paclitaxel and pembrolizumab for 4 cycles starting 06/23/2021, to be followed by doxorubicin and cyclophosphamide and pembrolizumab for 4 cycles completed 12/09/2021, with pembrolizumab completed 05/14/2022 2. right Lumpectomy: 0.5 cm grade 2 IDC 0/4 LN Neg, Margins Neg 3.  Adjuvant radiation therapy 03/10/2022-04/06/2022 ------------------------------------------------------------------------------------------------------------------------------ Breast cancer surveillance: Mammogram 09/23/2023: Benign breast density category C Breast Exam: 01/04/24: Benign   Patient is going to [place] in [place] on March 30 and is excited about that trip. Return to clinic in 1 year for follow-up

## 2024-01-09 ENCOUNTER — Ambulatory Visit: Payer: BC Managed Care – PPO | Attending: Adult Health

## 2024-01-09 VITALS — Wt 202.5 lb

## 2024-01-09 DIAGNOSIS — Z483 Aftercare following surgery for neoplasm: Secondary | ICD-10-CM | POA: Insufficient documentation

## 2024-01-09 NOTE — Therapy (Signed)
 OUTPATIENT PHYSICAL THERAPY SOZO SCREENING NOTE   Patient Name: Rita Davis MRN: [id number] DOB:04/24/1961, 63 y.o., female Today's Date: 01/09/2024  PCP: Serena Croissant, MD REFERRING PROVIDER: Lillard Anes Cornett*   PT End of Session - 01/09/24 1600     Visit Number 10   # unchanged due to screen only   PT Start Time 1558    PT Stop Time 1602    PT Time Calculation (min) 4 min    Activity Tolerance Patient tolerated treatment well    Behavior During Therapy WFL for tasks assessed/performed             Past Medical History:  Diagnosis Date   Anemia    had a fibroid tumor, was anemic at that time   Anxiety    Cancer [facility])    right breast IDC   Family history of breast cancer    Family history of colon cancer    Family history of prostate cancer    Hyperlipidemia    Hypertension    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 ([place])   Pneumonia 11/2021   hospitalized   Port-A-Cath in place 06/24/2021   Pre-diabetes    Past Surgical History:  Procedure Laterality Date   ABDOMINAL HYSTERECTOMY     still has ovaries   BREAST BIOPSY Right 05/23/2012   BREAST BIOPSY Right 06/2021   BREAST CYST EXCISION Right    BREAST LUMPECTOMY Right 12/2021   BREAST LUMPECTOMY WITH RADIOACTIVE SEED AND SENTINEL LYMPH NODE BIOPSY Right 12/21/2021   Procedure: RIGHT BREAST LUMPECTOMY WITH RADIOACTIVE SEED AND SENTINEL LYMPH NODE BIOPSY;  Surgeon: Almond Lint, MD;  Location: [facility];  Service: General;  Laterality: Right;   cyst removed     from lower back   DILATION AND CURETTAGE OF UTERUS     ORIF ANKLE FRACTURE Left 11/13/2020   Procedure: OPEN TREATMENT OF LEFT TRIMALLEOLAR ANKLE FRACTURE WITH POSTERIOR FIXATION, SYNDESMOSIS;  Surgeon: Terance Hart, MD;  Location: [facility];  Service: Orthopedics;  Laterality: Left;  LENGTH OF SURGERY: 1.5 HOURS   PORT-A-CATH REMOVAL N/A 06/03/2022   Procedure: REMOVAL PORT-A-CATH;  Surgeon: Almond Lint,  MD;  Location: [facility] OR;  Service: General;  Laterality: N/A;   PORTACATH PLACEMENT Left 06/18/2021   Procedure: PORT PLACEMENT;  Surgeon: Almond Lint, MD;  Location[facility];  Service: General;  Laterality: Left;   Patient Active Problem List   Diagnosis Date Noted   Encounter for antineoplastic immunotherapy 02/10/2022   Bilateral pneumonia 11/05/2021   Pneumonia of both lower lobes due to infectious organism 11/04/2021   Genetic testing 07/10/2021   Family history of breast cancer 06/09/2021   Family history of prostate cancer 06/09/2021   Family history of colon cancer 06/09/2021   Malignant neoplasm of upper-inner quadrant of right breast in female, estrogen receptor negative (HCC) 06/09/2021   Pneumonia 03/21/2018   Fibroid tumor 03/10/2018   Adjustment insomnia 11/26/2015   Eczema 11/26/2015   Other specified abnormal findings of blood chemistry 11/26/2015   Healthcare maintenance 04/10/2014   Anxiety state 02/28/2014   OVERWEIGHT 03/27/2009   Overweight 03/27/2009   HYPERLIPIDEMIA 01/24/2009   ANEMIA 11/20/2007   HYPERTENSION 11/20/2007    REFERRING DIAG: right breast cancer at risk for lymphedema  THERAPY DIAG: Aftercare following surgery for neoplasm  PERTINENT HISTORY:  Initial Diagnosis 06/03/21 of Rt breast cancer. Neo-adjuvant chemotherapy started 06/23/21 of Carboplatin/Paclitaxel/Keytruda followed by Cyclophosphamide and Keytruda. Pt Rt lumpectomy and SLNB 0/4 LN positive  on 12/21/21 with Dr. Donell Beers, and then had radiation.     PRECAUTIONS: right UE Lymphedema risk,   SUBJECTIVE: Pt returns for her last 3 month L-Dex screen.    PAIN:  Are you having pain? No  SOZO SCREENING: Patient was assessed today using the SOZO machine to determine the lymphedema index score. This was compared to her baseline score. It was determined that she is within the recommended range when compared to her baseline and no further action is needed at this time. She will continue SOZO  screenings. These are done every 3 months for 2 years post operatively followed by every 6 months for 2 years, and then annually.   L-DEX FLOWSHEETS - 01/09/24 1600       L-DEX LYMPHEDEMA SCREENING   Measurement Type Unilateral    L-DEX MEASUREMENT EXTREMITY Upper Extremity    POSITION  Standing    DOMINANT SIDE Right    At Risk Side Right    BASELINE SCORE (UNILATERAL) -8.7    L-DEX SCORE (UNILATERAL) -5.1    VALUE CHANGE (UNILAT) 3.6             P: Begin 6 month screens.    Hermenia Bers, PTA 01/09/2024, 4:04 PM

## 2024-07-09 ENCOUNTER — Ambulatory Visit

## 2024-08-16 ENCOUNTER — Other Ambulatory Visit: Payer: Self-pay | Admitting: Hematology and Oncology

## 2024-08-16 DIAGNOSIS — Z853 Personal history of malignant neoplasm of breast: Secondary | ICD-10-CM

## 2024-09-24 ENCOUNTER — Ambulatory Visit
Admission: RE | Admit: 2024-09-24 | Discharge: 2024-09-24 | Disposition: A | Source: Ambulatory Visit | Attending: Hematology and Oncology

## 2024-09-24 DIAGNOSIS — Z853 Personal history of malignant neoplasm of breast: Secondary | ICD-10-CM

## 2025-01-03 ENCOUNTER — Inpatient Hospital Stay: Admitting: Hematology and Oncology
